# Patient Record
Sex: Male | Born: 1955 | Race: Black or African American | Hispanic: No | Marital: Single | Smoking: Never smoker
Health system: Southern US, Community
[De-identification: ages and names within clinical notes are randomized; demographics above are authoritative.]

## PROBLEM LIST (undated history)

## (undated) DIAGNOSIS — I639 Cerebral infarction, unspecified: Secondary | ICD-10-CM

## (undated) DIAGNOSIS — E119 Type 2 diabetes mellitus without complications: Secondary | ICD-10-CM

## (undated) DIAGNOSIS — I10 Essential (primary) hypertension: Secondary | ICD-10-CM

## (undated) HISTORY — PX: NO PAST SURGERIES: SHX2092

---

## 1998-07-19 ENCOUNTER — Emergency Department (HOSPITAL_COMMUNITY): Admission: EM | Admit: 1998-07-19 | Discharge: 1998-07-19 | Payer: Self-pay | Admitting: Emergency Medicine

## 1998-07-21 ENCOUNTER — Emergency Department (HOSPITAL_COMMUNITY): Admission: EM | Admit: 1998-07-21 | Discharge: 1998-07-21 | Payer: Self-pay | Admitting: Emergency Medicine

## 1998-12-14 ENCOUNTER — Encounter: Payer: Self-pay | Admitting: Family Medicine

## 1998-12-14 ENCOUNTER — Ambulatory Visit (HOSPITAL_COMMUNITY): Admission: RE | Admit: 1998-12-14 | Discharge: 1998-12-14 | Payer: Self-pay | Admitting: Family Medicine

## 1999-05-29 ENCOUNTER — Emergency Department (HOSPITAL_COMMUNITY): Admission: EM | Admit: 1999-05-29 | Discharge: 1999-05-29 | Payer: Self-pay | Admitting: Emergency Medicine

## 1999-09-24 ENCOUNTER — Emergency Department (HOSPITAL_COMMUNITY): Admission: EM | Admit: 1999-09-24 | Discharge: 1999-09-25 | Payer: Self-pay | Admitting: *Deleted

## 1999-10-06 ENCOUNTER — Emergency Department (HOSPITAL_COMMUNITY): Admission: EM | Admit: 1999-10-06 | Discharge: 1999-10-06 | Payer: Self-pay | Admitting: Emergency Medicine

## 1999-12-04 ENCOUNTER — Emergency Department (HOSPITAL_COMMUNITY): Admission: EM | Admit: 1999-12-04 | Discharge: 1999-12-04 | Payer: Self-pay | Admitting: Emergency Medicine

## 1999-12-07 ENCOUNTER — Emergency Department (HOSPITAL_COMMUNITY): Admission: EM | Admit: 1999-12-07 | Discharge: 1999-12-07 | Payer: Self-pay | Admitting: Emergency Medicine

## 2000-05-21 ENCOUNTER — Emergency Department (HOSPITAL_COMMUNITY): Admission: EM | Admit: 2000-05-21 | Discharge: 2000-05-21 | Payer: Self-pay | Admitting: Emergency Medicine

## 2001-10-29 ENCOUNTER — Emergency Department (HOSPITAL_COMMUNITY): Admission: EM | Admit: 2001-10-29 | Discharge: 2001-10-29 | Payer: Self-pay | Admitting: Emergency Medicine

## 2001-10-29 ENCOUNTER — Encounter: Payer: Self-pay | Admitting: Emergency Medicine

## 2002-02-24 ENCOUNTER — Ambulatory Visit (HOSPITAL_COMMUNITY): Admission: RE | Admit: 2002-02-24 | Discharge: 2002-02-24 | Payer: Self-pay | Admitting: Family Medicine

## 2002-02-24 ENCOUNTER — Encounter: Payer: Self-pay | Admitting: Family Medicine

## 2004-12-30 ENCOUNTER — Emergency Department (HOSPITAL_COMMUNITY): Admission: EM | Admit: 2004-12-30 | Discharge: 2004-12-30 | Payer: Self-pay | Admitting: Emergency Medicine

## 2005-05-18 ENCOUNTER — Emergency Department (HOSPITAL_COMMUNITY): Admission: EM | Admit: 2005-05-18 | Discharge: 2005-05-18 | Payer: Self-pay | Admitting: Emergency Medicine

## 2005-06-01 ENCOUNTER — Encounter (HOSPITAL_BASED_OUTPATIENT_CLINIC_OR_DEPARTMENT_OTHER): Admission: RE | Admit: 2005-06-01 | Discharge: 2005-08-30 | Payer: Self-pay | Admitting: Surgery

## 2006-09-21 ENCOUNTER — Emergency Department (HOSPITAL_COMMUNITY): Admission: EM | Admit: 2006-09-21 | Discharge: 2006-09-21 | Payer: Self-pay | Admitting: *Deleted

## 2007-10-15 ENCOUNTER — Ambulatory Visit: Admission: RE | Admit: 2007-10-15 | Discharge: 2007-12-02 | Payer: Self-pay | Admitting: Radiation Oncology

## 2011-04-28 NOTE — Consult Note (Signed)
NAMEPRICE, LACHAPELLE                ACCOUNT NO.:  [id number]   MEDICAL RECORD NO.:  [id number]          PATIENT TYPE:   LOCATION:                                 FACILITY:   PHYSICIAN:  Theresia Majors. Tanda Rockers, M.D.DATE OF BIRTH:  02/06/1959   DATE OF CONSULTATION:  06/01/2005  DATE OF DISCHARGE:                                   CONSULTATION   REASON FOR CONSULTATION:  This 55 year old man is referred by Dr. Johny Blamer for evaluation of a left tenia pedis.   IMPRESSION:  Contact dermatitis.   RECOMMENDATIONS:  Bulky dressing to the left foot with cotton and placed  inter digitally.  The patient is to cleanse his foot twice a day with mild  soap and apply 0.5% hydrocortisone cream.  He is to avoid sweating or a  saturated dressing on the foot.  He will be followed up in one week to  assess his response to therapy.   SUBJECTIVE:  This 55 year old man was in good health until a month ago when  he purchased a new pair of shoes and 3-4 days later he had severe itching in  his left lower extremity which he attributed to a puncture wound, although  he does not recall precisely having sustained such an injury.  He had  intense itching resulting in scratching, the scratching resulted in weeping  of the skin and spreading redness.  He was seen in an Urgent Care Center and  was treated symptomatically and discharged.  His symptoms increased over the  next 5-7 days and he was seen in the emergency room at [facility]  and treated with topical steroid and an antifungal.  His symptoms did not  improve significantly and he was seen at [facility] a week ago and he  was given a proprietary pellet to soak his feet in and was told to see in  followup in the interim.  He was directed to the [facility] for  evaluation.   PAST MEDICAL HISTORY:  Remarkable for the absence of chronic or ongoing  illnesses.   HE DENIES ALLERGIES.   He is on no chronic medication.   FAMILY  HISTORY:  Negative for diabetes or hypertension.   SOCIAL HISTORY:  He is single and is employed at [employer].   REVIEW OF SYSTEMS:  Discloses that he is completely healthy with no  cardiovascular, pulmonary symptoms.  No GI/GU complaints nor neurological or  endocrine abnormalities.   PHYSICAL EXAMINATION:  He is alert and oriented, in no acute distress.  HEENT:  Exam is clear.  NECK:  Supple.  HEART:  Sounds are normal.  ABDOMEN:  Soft.  EXTREMITIES:  Exam is abnormal with a large inflamed excoriated area on the  dorsum of his left foot, this extends volarly.  On the volar aspect there is  evidence of intense desquamation and discoloration.  There are no frank  abscesses.  There is no drainage.  Photographs of these affected areas were  taken and are included in the report.   DISCUSSION:  This 55 year old man describes a  contact dermatitis most likely  related to the dye related in his shoes or either the material on the  internal surfaces of the shoes.  He has already discarded the shoes.  We  have encouraged him not to wear them again.  The above measures should  result in slow, but continued improvement.  We will see him in one week to  reassess his response to therapy.           ______________________________  Theresia Majors. Tanda Rockers, M.D.     Cephus Slater  D:  06/01/2005  T:  06/01/2005  Job:  [id number]

## 2013-09-29 ENCOUNTER — Ambulatory Visit: Payer: Managed Care, Other (non HMO)

## 2013-11-12 ENCOUNTER — Emergency Department (HOSPITAL_COMMUNITY): Payer: Self-pay

## 2013-11-12 ENCOUNTER — Inpatient Hospital Stay (HOSPITAL_COMMUNITY): Payer: Self-pay

## 2013-11-12 ENCOUNTER — Inpatient Hospital Stay (HOSPITAL_COMMUNITY)
Admission: EM | Admit: 2013-11-12 | Discharge: 2013-11-17 | DRG: 062 | Disposition: A | Discharge status: Home / Self Care | Payer: Self-pay | Attending: Neurology | Admitting: Neurology

## 2013-11-12 ENCOUNTER — Encounter (HOSPITAL_COMMUNITY): Payer: Self-pay | Admitting: Emergency Medicine

## 2013-11-12 DIAGNOSIS — I517 Cardiomegaly: Secondary | ICD-10-CM

## 2013-11-12 DIAGNOSIS — R29810 Facial weakness: Secondary | ICD-10-CM | POA: Diagnosis present

## 2013-11-12 DIAGNOSIS — E1165 Type 2 diabetes mellitus with hyperglycemia: Secondary | ICD-10-CM | POA: Diagnosis present

## 2013-11-12 DIAGNOSIS — E876 Hypokalemia: Secondary | ICD-10-CM | POA: Diagnosis not present

## 2013-11-12 DIAGNOSIS — I160 Hypertensive urgency: Secondary | ICD-10-CM

## 2013-11-12 DIAGNOSIS — I10 Essential (primary) hypertension: Secondary | ICD-10-CM

## 2013-11-12 DIAGNOSIS — R471 Dysarthria and anarthria: Secondary | ICD-10-CM | POA: Diagnosis present

## 2013-11-12 DIAGNOSIS — E785 Hyperlipidemia, unspecified: Secondary | ICD-10-CM | POA: Diagnosis present

## 2013-11-12 DIAGNOSIS — I639 Cerebral infarction, unspecified: Secondary | ICD-10-CM

## 2013-11-12 DIAGNOSIS — G8190 Hemiplegia, unspecified affecting unspecified side: Secondary | ICD-10-CM | POA: Diagnosis present

## 2013-11-12 DIAGNOSIS — R4789 Other speech disturbances: Secondary | ICD-10-CM | POA: Diagnosis present

## 2013-11-12 DIAGNOSIS — E119 Type 2 diabetes mellitus without complications: Secondary | ICD-10-CM | POA: Diagnosis present

## 2013-11-12 DIAGNOSIS — I672 Cerebral atherosclerosis: Secondary | ICD-10-CM | POA: Diagnosis present

## 2013-11-12 DIAGNOSIS — I6350 Cerebral infarction due to unspecified occlusion or stenosis of unspecified cerebral artery: Principal | ICD-10-CM

## 2013-11-12 LAB — BASIC METABOLIC PANEL
BUN: 9 mg/dL (ref 6–23)
CO2: 25 mEq/L (ref 19–32)
Calcium: 8.6 mg/dL (ref 8.4–10.5)
Creatinine, Ser: 0.84 mg/dL (ref 0.50–1.35)
GFR calc Af Amer: >90 mL/min (ref >90)
GFR calc non Af Amer: >90 mL/min (ref >90)
Sodium: 143 mEq/L (ref 135–145)

## 2013-11-12 LAB — CBC
HCT: 45 % (ref 39.0–52.0)
Hemoglobin: 15.1 g/dL (ref 13.0–17.0)
MCH: 27.8 pg (ref 26.0–34.0)
MCHC: 33.6 g/dL (ref 30.0–36.0)
MCV: 82.9 fL (ref 78.0–100.0)
Platelets: 244 10*3/uL (ref 150–400)
RBC: 5.43 MIL/uL (ref 4.22–5.81)
WBC: 7.5 10*3/uL (ref 4.0–10.5)

## 2013-11-12 LAB — DIFFERENTIAL
Basophils Relative: 1 % (ref 0–1)
Eosinophils Absolute: 0.1 10*3/uL (ref 0.0–0.7)
Lymphs Abs: 5.5 10*3/uL — ABNORMAL HIGH (ref 0.7–4.0)
Monocytes Absolute: 0.6 10*3/uL (ref 0.1–1.0)
Monocytes Relative: 8 % (ref 3–12)
Neutrophils Relative %: 16 % — ABNORMAL LOW (ref 43–77)

## 2013-11-12 LAB — RAPID URINE DRUG SCREEN, HOSP PERFORMED
Amphetamines: NOT DETECTED
Opiates: NOT DETECTED
Tetrahydrocannabinol: NOT DETECTED

## 2013-11-12 LAB — POCT I-STAT, CHEM 8
BUN: 12 mg/dL (ref 6–23)
Chloride: 102 mEq/L (ref 96–112)
Creatinine, Ser: 1.4 mg/dL — ABNORMAL HIGH (ref 0.50–1.35)
HCT: 49 % (ref 39.0–52.0)
Potassium: 2.1 mEq/L — CL (ref 3.5–5.1)
Sodium: 143 mEq/L (ref 135–145)
TCO2: 20 mmol/L (ref 0–100)

## 2013-11-12 LAB — POCT I-STAT TROPONIN I: Troponin i, poc: 0.02 ng/mL (ref 0.00–0.08)

## 2013-11-12 LAB — COMPREHENSIVE METABOLIC PANEL
Albumin: 4.2 g/dL (ref 3.5–5.2)
Alkaline Phosphatase: 91 U/L (ref 39–117)
BUN: 13 mg/dL (ref 6–23)
Chloride: 100 mEq/L (ref 96–112)
Creatinine, Ser: 1.03 mg/dL (ref 0.50–1.35)
GFR calc Af Amer: 90 mL/min — ABNORMAL LOW (ref >90)
Glucose, Bld: 171 mg/dL — ABNORMAL HIGH (ref 70–99)
Potassium: 2.4 mEq/L — CL (ref 3.5–5.1)
Sodium: 142 mEq/L (ref 135–145)
Total Bilirubin: 0.4 mg/dL (ref 0.3–1.2)
Total Protein: 7.4 g/dL (ref 6.0–8.3)

## 2013-11-12 LAB — URINALYSIS, ROUTINE W REFLEX MICROSCOPIC
Glucose, UA: NEGATIVE mg/dL
Leukocytes, UA: NEGATIVE
Nitrite: NEGATIVE
Protein, ur: 100 mg/dL — AB
pH: 5.5 (ref 5.0–8.0)

## 2013-11-12 LAB — LIPID PANEL
Cholesterol: 216 mg/dL — ABNORMAL HIGH (ref 0–200)
HDL: 45 mg/dL (ref >39)
Total CHOL/HDL Ratio: 4.8 RATIO

## 2013-11-12 LAB — HEMOGLOBIN A1C: Mean Plasma Glucose: 148 mg/dL — ABNORMAL HIGH (ref <117)

## 2013-11-12 LAB — URINE MICROSCOPIC-ADD ON

## 2013-11-12 LAB — TROPONIN I: Troponin I: <0.3 ng/mL (ref <0.30)

## 2013-11-12 LAB — PROTIME-INR: Prothrombin Time: 11.9 seconds (ref 11.6–15.2)

## 2013-11-12 MED ORDER — SODIUM CHLORIDE 0.9 % IV SOLN
INTRAVENOUS | Status: DC
  Administered 2013-11-12: 06:00:00 via INTRAVENOUS

## 2013-11-12 MED ORDER — PANTOPRAZOLE SODIUM 40 MG IV SOLR
40.0000 mg | Freq: Every day | INTRAVENOUS | Status: DC
  Administered 2013-11-12: 40 mg via INTRAVENOUS
  Filled 2013-11-12 (×2): qty 40

## 2013-11-12 MED ORDER — ALTEPLASE (STROKE) FULL DOSE INFUSION
0.9000 mg/kg | Freq: Once | INTRAVENOUS | Status: AC
  Administered 2013-11-12: 100 mg via INTRAVENOUS
  Filled 2013-11-12: qty 88

## 2013-11-12 MED ORDER — FENTANYL CITRATE 0.05 MG/ML IJ SOLN
INTRAMUSCULAR | Status: AC | PRN
  Administered 2013-11-12: 25 ug via INTRAVENOUS

## 2013-11-12 MED ORDER — POTASSIUM CHLORIDE 10 MEQ/50ML IV SOLN
INTRAVENOUS | Status: AC
  Filled 2013-11-12: qty 50

## 2013-11-12 MED ORDER — MIDAZOLAM HCL 2 MG/2ML IJ SOLN
INTRAMUSCULAR | Status: AC
  Filled 2013-11-12: qty 2

## 2013-11-12 MED ORDER — LABETALOL HCL 5 MG/ML IV SOLN
10.0000 mg | INTRAVENOUS | Status: DC | PRN
  Administered 2013-11-12 – 2013-11-16 (×3): 10 mg via INTRAVENOUS
  Filled 2013-11-12 (×3): qty 4

## 2013-11-12 MED ORDER — ACETAMINOPHEN 325 MG PO TABS
650.0000 mg | ORAL_TABLET | ORAL | Status: DC | PRN
  Administered 2013-11-15 – 2013-11-16 (×2): 650 mg via ORAL
  Filled 2013-11-12 (×2): qty 2

## 2013-11-12 MED ORDER — NICARDIPINE HCL IN NACL 20-0.86 MG/200ML-% IV SOLN
5.0000 mg/h | INTRAVENOUS | Status: DC
  Administered 2013-11-12 (×2): 5 mg/h via INTRAVENOUS
  Filled 2013-11-12 (×3): qty 200

## 2013-11-12 MED ORDER — LABETALOL HCL 5 MG/ML IV SOLN
INTRAVENOUS | Status: AC
  Administered 2013-11-12: 20 mg
  Filled 2013-11-12: qty 4

## 2013-11-12 MED ORDER — ACETAMINOPHEN 650 MG RE SUPP: 650.0000 mg | RECTAL | Status: DC | PRN

## 2013-11-12 MED ORDER — ASPIRIN EC 325 MG PO TBEC
325.0000 mg | DELAYED_RELEASE_TABLET | Freq: Every day | ORAL | Status: DC
  Administered 2013-11-12 – 2013-11-17 (×6): 325 mg via ORAL
  Filled 2013-11-12 (×6): qty 1

## 2013-11-12 MED ORDER — HYDRALAZINE HCL 20 MG/ML IJ SOLN
INTRAMUSCULAR | Status: AC
  Administered 2013-11-12: 5 mg via INTRAVENOUS
  Filled 2013-11-12: qty 1

## 2013-11-12 MED ORDER — IOHEXOL 300 MG/ML  SOLN
150.0000 mL | Freq: Once | INTRAMUSCULAR | Status: AC | PRN
  Administered 2013-11-12: 70 mL via INTRAVENOUS

## 2013-11-12 MED ORDER — NICARDIPINE HCL IN NACL 20-0.86 MG/200ML-% IV SOLN: 5.0000 mg/h | Freq: Once | INTRAVENOUS | Status: AC

## 2013-11-12 MED ORDER — MIDAZOLAM HCL 2 MG/2ML IJ SOLN
INTRAMUSCULAR | Status: AC | PRN
  Administered 2013-11-12: 1 mg via INTRAVENOUS

## 2013-11-12 MED ORDER — FENTANYL CITRATE 0.05 MG/ML IJ SOLN
INTRAMUSCULAR | Status: AC
  Filled 2013-11-12: qty 2

## 2013-11-12 MED ORDER — POTASSIUM CHLORIDE 10 MEQ/100ML IV SOLN
10.0000 meq | INTRAVENOUS | Status: AC
  Administered 2013-11-12 (×4): 10 meq via INTRAVENOUS
  Filled 2013-11-12 (×4): qty 100

## 2013-11-12 MED ORDER — LORAZEPAM 2 MG/ML IJ SOLN
1.0000 mg | INTRAMUSCULAR | Status: DC | PRN
  Administered 2013-11-12 – 2013-11-13 (×3): 1 mg via INTRAVENOUS
  Filled 2013-11-12 (×3): qty 1

## 2013-11-12 MED ORDER — SODIUM CHLORIDE 0.9 % IV SOLN
INTRAVENOUS | Status: DC
  Administered 2013-11-13: 05:00:00 via INTRAVENOUS

## 2013-11-12 NOTE — ED Notes (Signed)
Patient come from home per ems patient present with facial drop and left side arm and leg weakness -- pt come in code stroke call, stroke nurse assess patient and sent to ct

## 2013-11-12 NOTE — ED Notes (Signed)
MD at bedside. 

## 2013-11-12 NOTE — ED Notes (Signed)
Seen by Dr. Ranae Palms at the bridge on arrival, pt into CT scanner room at this time.

## 2013-11-12 NOTE — ED Provider Notes (Signed)
CSN: [id number]     Arrival date & time 11/12/13  0156 History   First MD Initiated Contact with Patient 11/12/13 0158     No chief complaint on file.  (Consider location/radiation/quality/duration/timing/severity/associated sxs/prior Treatment) HPI Arrives by EMS as a code stroke. Patient states he was in his normal state of health at midnight. He now has left facial droop and left-sided hemiparesis. He was tachycardic and hypertensive en route. Patient has a history of hypertension. No past medical history on file. No past surgical history on file. No family history on file. History  Substance Use Topics  . Smoking status: Not on file  . Smokeless tobacco: Not on file  . Alcohol Use: Not on file    Review of Systems  Unable to perform ROS Constitutional: Negative for fever and chills.  HENT: Positive for voice change.   Respiratory: Negative for shortness of breath.   Cardiovascular: Negative for chest pain.  Gastrointestinal: Negative for nausea, vomiting and abdominal pain.  Skin: Negative for rash and wound.  Neurological: Positive for facial asymmetry, weakness and numbness.  All other systems reviewed and are negative.    Allergies  Review of patient's allergies indicates not on file.  Home Medications  No current outpatient prescriptions on file. There were no vitals taken for this visit. Physical Exam  Nursing note and vitals reviewed. Constitutional: He is oriented to person, place, and time. He appears well-developed and well-nourished. No distress.  HENT:  Head: Normocephalic and atraumatic.  Mouth/Throat: Oropharynx is clear and moist.  Eyes: EOM are normal. Pupils are equal, round, and reactive to light.  Neck: Normal range of motion. Neck supple.  Cardiovascular: Normal rate and regular rhythm.   Pulmonary/Chest: Effort normal and breath sounds normal. No respiratory distress. He has no wheezes. He has no rales.  Abdominal: Soft. Bowel sounds are normal.  He exhibits no distension and no mass. There is no tenderness. There is no rebound and no guarding.  Musculoskeletal: Normal range of motion. He exhibits no edema and no tenderness.  Neurological: He is alert and oriented to person, place, and time.  Left lower face weakness. 2/5 motor left upper extremity with increased tone. 4/5 motor in the left lower extremity. 5/5 right upper and lower extremity. Decreased sensation on the left. NIH stroke scale score of 8  Skin: Skin is warm and dry. No rash noted. No erythema.  Psychiatric: He has a normal mood and affect. His behavior is normal.    ED Course  Procedures (including critical care time) Labs Review Labs Reviewed  ETHANOL  PROTIME-INR  APTT  CBC  DIFFERENTIAL  COMPREHENSIVE METABOLIC PANEL  TROPONIN I  URINE RAPID DRUG SCREEN (HOSP PERFORMED)  URINALYSIS, ROUTINE W REFLEX MICROSCOPIC   Imaging Review No results found.  EKG Interpretation    Date/Time:    Ventricular Rate:    PR Interval:    QRS Duration:   QT Interval:    QTC Calculation:   R Axis:     Text Interpretation:              MDM  Dr. Roseanne Reno at bedside. CT without evidence of acute bleed. Neurology will treat with TPA.   Loren Racer, MD 11/12/13 [phone]

## 2013-11-12 NOTE — Code Documentation (Signed)
57 yo bm brought in via [facility] for stroke symptoms.  Per report pt was last up walking at 0000 & started feeling "weird" at 0100 when he called EMS.  Pt was hypertensive 220s/120s upon arrival to ED.  Pt is weak on Left with Lt facial droop, dysarthria, & sensory loss, NIH 10.  Labetalol IV given without appreciable change so cardene gtt was started.  See doc flowsheets for code stroke times.

## 2013-11-12 NOTE — ED Notes (Signed)
Family at bedside. 

## 2013-11-12 NOTE — Progress Notes (Signed)
Stroke Team Progress Note  HISTORY 57 yo M, history of HTN with medication non-compliance, presented 12/3 with left face, left arm, and left leg weakness, as well as dysarthria.  Patient last normal at 1:00 AM, given TPA at 2:38 AM on 12/3.  Symptoms only mildly improved.  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  BP Elevated to 202/120 on admission, and patient placed on nicardipine drip.  He was admitted to the neuro ICU for further evaluation and treatment. He had emergent cerebral catheter angiogram by Dr. Corliss Skains which did not show any large vessel occlusion.  SUBJECTIVE The patient's sister is at bedside.  This morning, the patient is alert and conversational.  He believes his symptoms have improved somewhat, but still is noted to have slurred speech, L face, L arm, and L leg weakness.  When we initially examined the patient, he had no movement in his left arm (worse than earlier in the morning), NIHSS = 7.  Over the course of our exam, his strength improved to 4/5.  OBJECTIVE Most recent Vital Signs: Filed Vitals:   11/12/13 0545 11/12/13 0600 11/12/13 0615 11/12/13 0630  BP: 121/98 138/84 133/89 140/85  Pulse: 90 93 90 91  Temp:      TempSrc:      Resp: 25 14 20 24   Height:      Weight:      SpO2: 97% 96% 98% 97%   CBG (last 3)   Recent Labs  11/12/13 0304  GLUCAP 182*    IV Fluid Intake:   . sodium chloride    . sodium chloride    . niCARDipine 5 mg/hr (11/12/13 0331)    MEDICATIONS  . fentaNYL      . midazolam      . pantoprazole (PROTONIX) IV  40 mg Intravenous QHS   PRN:  acetaminophen, acetaminophen, labetalol  Diet:  NPO  Activity:  Bedrest DVT Prophylaxis:  SCD's  CLINICALLY SIGNIFICANT STUDIES Basic Metabolic Panel:  Recent Labs Lab 11/12/13 0159 11/12/13 0205  NA 142 143  K 2.4* 2.1*  CL 100 102  CO2 21  --   GLUCOSE 171* 178*  BUN 13 12  CREATININE  1.03 1.40*  CALCIUM 8.8  --    Liver Function Tests:  Recent Labs Lab 11/12/13 0159  AST 22  ALT 22  ALKPHOS 91  BILITOT 0.4  PROT 7.4  ALBUMIN 4.2   CBC:  Recent Labs Lab 11/12/13 0159 11/12/13 0205  WBC 7.5  --   NEUTROABS 1.2*  --   HGB 15.1 16.7  HCT 45.0 49.0  MCV 82.9  --   PLT 244  --    Coagulation:  Recent Labs Lab 11/12/13 0159  LABPROT 11.9  INR 0.89   Cardiac Enzymes:  Recent Labs Lab 11/12/13 0200  TROPONINI <0.30   Urinalysis:  Recent Labs Lab 11/12/13 0407  COLORURINE YELLOW  LABSPEC 1.013  PHURINE 5.5  GLUCOSEU NEGATIVE  HGBUR SMALL*  BILIRUBINUR NEGATIVE  KETONESUR NEGATIVE  PROTEINUR 100*  UROBILINOGEN 0.2  NITRITE NEGATIVE  LEUKOCYTESUR NEGATIVE   Lipid Panel No results found for this basename: chol, trig, hdl, cholhdl, vldl, ldlcalc   HgbA1C  No results found for this basename: HGBA1C    Urine Drug Screen:     Component Value Date/Time   LABOPIA NONE DETECTED 11/12/2013 0408   COCAINSCRNUR NONE DETECTED 11/12/2013 0408   LABBENZ NONE DETECTED 11/12/2013  0408   AMPHETMU NONE DETECTED 11/12/2013 0408   THCU NONE DETECTED 11/12/2013 0408   LABBARB NONE DETECTED 11/12/2013 0408    Alcohol Level:  Recent Labs Lab 11/12/13 0159  ETH <11    Ct Head Wo Contrast  11/12/2013   CLINICAL DATA:  Left-sided weakness and tingling.  EXAM: CT HEAD WITHOUT CONTRAST  TECHNIQUE: Contiguous axial images were obtained from the base of the skull through the vertex without intravenous contrast.  COMPARISON:  None.  FINDINGS: Sinuses/Soft tissues: Mucosal thickening of right maxillary sinus. Clear mastoid air cells.  Intracranial: Mild low density in the periventricular white matter likely related to small vessel disease. Vague hypoattenuation within the left caudate head extending into the lateral aspect of the basal ganglia and corona radiata. No significant mass effect. Minimal ex vacuo dilatation of the anterior horn of the left lateral  ventricle.  No hemorrhage, hydrocephalus, intra-axial, or extra-axial fluid collection.  IMPRESSION: 1. Hypoattenuation within the left caudate head and basal ganglia, extending into the left corona radiata. Favored to be remote, given absence of mass effect and resultant ex vacuo dilatation of the anterior horn lateral ventricle. These results were called by telephone at the time of interpretation on 11/12/2013 at 2:21 AM to Dr. Roseanne Reno, who verbally acknowledged these results. 2. Mild small vessel ischemic change. 3. Sinus disease.   Electronically Signed   By: Jeronimo Greaves M.D.   On: 11/12/2013 02:23    CT of the brain   hypoattenuation in left caudate head and basal ganglia, extending into L corona radiata, favored to be remote  MRI of the brain   Pending  MRA of the brain   Pending  2D Echocardiogram   Pending  Carotid Doppler   Pending  CXR   Pending  EKG  Sinus rhythm, no acute ST abnormalities  Therapy Recommendations Pending  Physical Exam   Filed Vitals:   11/12/13 0745  BP:   Pulse: 86  Temp: 98 F (36.7 C)  Resp: 22   Mental Status:  A&O x3, speech fluent though with mild dysarthria.  Pt follows commands without difficulty. Cranial Nerves:  II - visual fields intact  III/IV/VI - PERRL, EOMI  V/VII - left lower facial droop  VIII - hearing intact X - palate elevates bilaterally, speech mildly slurred XII - L tongue deviation Motor:  L arm: initially 0/5, then improved to 4/5 L leg: 4/5 R arm: 5/5 R leg: 5/5 Normal muscle bulk and tone Sensory: intact Deep Tendon Reflexes: 1+ bilaterally Cerebellar: Normal finger-to-nose testing (impaired on left due to strength)  Carotid auscultation: No bruit NIHSS 3 ASSESSMENT [name] is a 57 y.o. male presenting with L face and extremity weakness and dysarthria. Status post IV t-PA at 2:38 AM 12/3. 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 Infarct felt  to be thrombotic secondary to small vessel disease.  On no anticoagulation prior to admission. Will start Aspirin after follow-up CT scan for secondary stroke prevention. Patient with resultant mild improvement in symptoms. Work up underway.   IR angiogram showed no large vessel disease, though scattered intracranial atherosclerosis  S/p [facility] 12/3  Hospital day # 0  TREATMENT/PLAN  After repeat CT scan tomorrow morning, start ASA 325 mg daily for secondary stroke prevention.  MRI pending  Follow-up A1C, FLP  2D echo ordered, pending  PT/OT/SLP pending  Repleted K.  Recheck K and Mg at 10:00 am  Keep SBP 140-180 with  cardizem drip (per cuff)  Janalyn Harder, PGY3 Pager: [phone] 11/12/2013 7:14 AM This patient is critically ill and at significant risk of neurological worsening, death and care requires constant monitoring of vital signs, hemodynamics,respiratory and cardiac monitoring,review of multiple databases, neurological assessment, discussion with family, other specialists and medical decision making of high complexity. I spent 30 minutes of neurocritical care time  in the care of  this patient. I have personally obtained a history, examined the patient, evaluated imaging results, and formulated the assessment and plan of care. I agree with the above. Delia Heady, MD

## 2013-11-12 NOTE — ED Notes (Signed)
Patient transported to CT 

## 2013-11-12 NOTE — ED Notes (Signed)
CBG-182. Notified RN 

## 2013-11-12 NOTE — ED Notes (Signed)
Patient arrived to CT 

## 2013-11-12 NOTE — Progress Notes (Signed)
UR completed.  Kayin Osment, RN BSN MHA CCM Trauma/Neuro ICU Case Manager [phone]  

## 2013-11-12 NOTE — Procedures (Addendum)
S/P 4 vessle cerebral arteriogram  RT CFA approach. Findings. No gross occlusions,stenosis , intralumoinal filling defects or dissections seen. 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 Scattered focal areas of caliber irregularity = intracranial arteriosclerosis

## 2013-11-12 NOTE — Progress Notes (Signed)
Pt becoming more restlessSBP in 200s.. Neuro exam unchanged. He keeps moving rt leg and bending at waist despite education. He cant get comfortable, he denies pain. He is agitated. Provider Alveda Reasons notified, orders received.

## 2013-11-12 NOTE — ED Notes (Signed)
Dr Stewart at bedside.

## 2013-11-12 NOTE — Progress Notes (Signed)
Subjective: Pt awake/alert; family in room; feeling better; denies HA, visual changes; has had some intermittent dizzy spells  Objective: Vital signs in last 24 hours: Temp:  [98 F (36.7 C)-98.7 F (37.1 C)] 98.2 F (36.8 C) (12/03 1100) Pulse Rate:  [80-110] 82 (12/03 1200) Resp:  [14-34] 20 (12/03 1200) BP: (121-202)/(72-136) 131/76 mmHg (12/03 1200) SpO2:  [93 %-98 %] 97 % (12/03 1200) Arterial Line BP: (143-183)/(75-100) 165/91 mmHg (12/03 1200) Weight:  [215 lb 9.8 oz (97.8 kg)-216 lb 14.9 oz (98.4 kg)] 216 lb 14.9 oz (98.4 kg) (12/03 0500) Last BM Date: 11/12/13  Intake/Output from previous day: 12/02 0701 - 12/03 0700 In: 486.7 [I.V.:186.7; IV Piggyback:300] Out: -  Intake/Output this shift: Total I/O In: 561.7 [I.V.:561.7] Out: 3551 [Urine:3550; Stool:1]  Pt awake/alert; mild left facial droop/mild dysarthria; PERRL/EOMI; strength improved in LUE/LLE- now 4/5; 5/5 RUE/RLE; rt groin /CFA sheath intact, NT, no hematoma; intact distal pulses  Lab Results:   Recent Labs  11/12/13 0159 11/12/13 0205  WBC 7.5  --   HGB 15.1 16.7  HCT 45.0 49.0  PLT 244  --    BMET  Recent Labs  11/12/13 0159 11/12/13 0205  NA 142 143  K 2.4* 2.1*  CL 100 102  CO2 21  --   GLUCOSE 171* 178*  BUN 13 12  CREATININE 1.03 1.40*  CALCIUM 8.8  --    PT/INR  Recent Labs  11/12/13 0159  LABPROT 11.9  INR 0.89   ABG No results found for this basename: PHART, PCO2, PO2, HCO3,  in the last 72 hours  Studies/Results: Ct Head Wo Contrast  11/12/2013   CLINICAL DATA:  Left-sided weakness and tingling.  EXAM: CT HEAD WITHOUT CONTRAST  TECHNIQUE: Contiguous axial images were obtained from the base of the skull through the vertex without intravenous contrast.  COMPARISON:  None.  FINDINGS: Sinuses/Soft tissues: Mucosal thickening of right maxillary sinus. Clear mastoid air cells.  Intracranial: Mild low density in the periventricular white matter likely related to small vessel  disease. Vague hypoattenuation within the left caudate head extending into the lateral aspect of the basal ganglia and corona radiata. No significant mass effect. Minimal ex vacuo dilatation of the anterior horn of the left lateral ventricle.  No hemorrhage, hydrocephalus, intra-axial, or extra-axial fluid collection.  IMPRESSION: 1. Hypoattenuation within the left caudate head and basal ganglia, extending into the left corona radiata. Favored to be remote, given absence of mass effect and resultant ex vacuo dilatation of the anterior horn lateral ventricle. These results were called by telephone at the time of interpretation on 11/12/2013 at 2:21 AM to Dr. Roseanne Reno, who verbally acknowledged these results. 2. Mild small vessel ischemic change. 3. Sinus disease.   Electronically Signed   By: Jeronimo Greaves M.D.   On: 11/12/2013 02:23   Dg Chest Port 1 View  11/12/2013   CLINICAL DATA:  Stroke  EXAM: PORTABLE CHEST - 1 VIEW  COMPARISON:  None.  FINDINGS: Low lung volumes are present, causing crowding of the pulmonary vasculature. Accounting for the low lung volumes, there is borderline cardiomegaly. Mild thoracic spondylosis. The lungs appear clear.  IMPRESSION: 1. Borderline cardiomegaly. 2. Low lung volumes.   Electronically Signed   By: Herbie Baltimore M.D.   On: 11/12/2013 09:56   Ir Angio Intra Extracran Sel Com Carotid Innominate Bilat Mod Sed  11/12/2013   CLINICAL DATA:  Acute onset of left-sided hemiparesis left arm greater than left leg. Gaze deviation. Severe dysarthria.  EXAM:  BILATERAL COMMON CAROTID AND INNOMINATE ANGIOGRAPHY AND BILATERAL VERTEBRAL ARTERY ANGIOGRAMS:  ANESTHESIA/SEDATION: Modified sedation  MEDICATIONS:  Versed 1 mg IV. Fentanyl 25 mcg IV.  CONTRAST:  70mL OMNIPAQUE IOHEXOL 300 MG/ML  SOLN  PROCEDURE: Following a full explanation of the procedure along with the potential associated complications, an informed witnessed consent was obtained from patient's sister and son.  The right  groin was prepped and draped in the usual sterile fashion. Thereafter, using a modified Seldinger technique, transfemoral access into right common femoral artery was obtained without difficulty. Over a 0.035-inch guidewire, a 5 [place] Pinnacle sheath was inserted. Through this and also over a 0.035-inch guidewire, a 5 [place] JB1 catheter was advanced to the aortic arch region and selectively positioned in the right common carotid artery, the right vertebral artery, the left common carotid artery and left vertebral artery.  There were no acute complications. The patient tolerated the procedure well.  COMPLICATIONS: None immediate.  FINDINGS: The right common carotid arteriogram demonstrates the right external carotid artery and its major branches to be normal.  The right internal carotid artery at the bulb to the cranial skull base opacifies normally.  The petrous, cavernous and supraclinoid segments are widely patent. A dominant right posterior communicating artery is seen opacifying the right posterior cerebral artery distribution.  The right middle cerebral artery is seen to opacify into the capillary and venous phases. Scattered focal areas of mild irregularity suggest intracranial arteriosclerosis.  There is no opacification of the right anterior cerebral artery A1 segment.  The right vertebral artery origin is normal.  The vessel opacifies normally to the cranial skull base.  There is normal opacification of the hypoplastic right posterior-inferior cerebellar artery.  The basilar artery, the left posterior cerebral artery, the superior cerebral arteries and the anterior inferior cerebral arteries opacify normally into the capillary and venous phases. The right posterior cerebral artery has poor opacification secondary to mixing with the non-opacified blood from the anterior circulation as described above.  The left common carotid arteriogram demonstrates the left external carotid artery and its major branch to  be normal.  The left internal carotid artery at the bulb to the cranial skull base opacifies normally.  The petrous, cavernous and supraclinoid segments are widely patent. Again seen is a left posterior communicating artery with partial opacification of the left posterior inferior cerebellar artery distribution.  The left middle and the left anterior cerebral arteries opacify into the capillary and venous phases.  Caliber irregularity is seen of the pericallosal and the callosal marginal branches suggestive of intracranial arteriosclerosis.  Cross filling via the anterior communicating artery of the right anterior cerebral artery A2 segment is seen. The left vertebral artery origin is from the thyrocervical trunk.  The vessel opacifies normally to the cranial skull base.  There is normal opacification of the left vertebral artery to the skull base. At the level of approximately C2 there is fenestration of the right vertebral artery 2nd vertical segment, a developmental variation.  Distal to this the left vertebrobasilar junction is seen to opacify normally. Normal opacification is seen of the left posterior inferior cerebellar artery.  The opacified portion of the basilar artery, the posterior cerebral arteries, the superior cerebellar arteries and the anterior inferior cerebellar arteries is grossly normal into the capillary and venous phases. Significant mixing with non-opacified blood from the contralateral vertebral artery is seen. Also visualized is the opacification of the anterior spinal artery at the level of C1-C2.  IMPRESSION: Angiographically no evidence of gross  intraluminal filling defects, occlusions, stenoses, dissections or of aneurysms or dural AV malformations.  Scattered focal areas of caliber irregularity in the anterior circulation as described most likely representing intracranial arteriosclerosis.   Electronically Signed   By: Julieanne Cotton M.D.   On: 11/12/2013 09:11     Anti-infectives: Anti-infectives   None      Assessment/Plan: s/p cerebral angio earlier this am- see results above (hx left sided weakness, dysarthria); plan for rt CFA sheath removal later this evening(pt received TPA earlier); other plans as outlined by neuro  LOS: 0 days    Benjamin Cohen,D [facility] 11/12/2013

## 2013-11-12 NOTE — Evaluation (Signed)
Clinical/Bedside Swallow Evaluation Patient Details  Name: Benjamin Cohen MRN: [id number] Date of Birth: 07-18-1956  Today's Date: 11/12/2013 Time: 0800-0820 SLP Time Calculation (min): 20 min  Past Medical History: History reviewed. No pertinent past medical history. Past Surgical History: History reviewed. No pertinent past surgical history. HPI:  57 y.o. 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 Given tPA. Pt with left CVA on CT, but also with left sided weakness. MRI is pending   Assessment / Plan / Recommendation Clinical Impression  Pt presents with no evidence of significant oral dyspahgia despite mild left labial and lingual weakness. No evidence of aspiration observed. Pt may initiate regular diet and thin liquids when in upright position. No SLP f/u needed for swallowing.     Aspiration Risk  Mild    Diet Recommendation Regular;Thin liquid   Liquid Administration via: Cup;Straw Medication Administration: Whole meds with liquid Supervision: Patient able to self feed Postural Changes and/or Swallow Maneuvers: Seated upright 90 degrees    Other  Recommendations     Follow Up Recommendations  None    Frequency and Duration        Pertinent Vitals/Pain NA    SLP Swallow Goals     Swallow Study Prior Functional Status       General HPI: 57 y.o. 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 Given tPA. Pt with left CVA on CT, but also with left sided weakness. MRI is pending Type of Study: Bedside swallow evaluation Diet Prior to this Study: NPO Temperature Spikes Noted: No Respiratory Status: Room air History of Recent Intubation: No Behavior/Cognition: Alert;Cooperative;Pleasant mood Oral Cavity - Dentition: Adequate natural dentition Self-Feeding Abilities: Able to feed self Patient Positioning: Partially  reclined Baseline Vocal Quality: Clear Volitional Cough: Strong Volitional Swallow: Able to elicit    Oral/Motor/Sensory Function Overall Oral Motor/Sensory Function: Impaired Labial ROM: Reduced left Labial Symmetry: Abnormal symmetry left Labial Strength: Reduced Labial Sensation: Reduced Lingual ROM: Reduced left Lingual Symmetry: Abnormal symmetry left Lingual Strength: Reduced Lingual Sensation: Reduced Facial ROM: Reduced left Facial Symmetry: Within Functional Limits Facial Strength: Within Functional Limits Velum: Within Functional Limits Mandible: Within Functional Limits   Ice Chips Ice chips: Within functional limits   Thin Liquid Thin Liquid: Within functional limits    Nectar Thick Nectar Thick Liquid: Not tested   Honey Thick Honey Thick Liquid: Not tested   Puree Puree: Within functional limits   Solid   GO    Solid: Within functional limits      [facility], [place] CCC-SLP [phone]  [name] 11/12/2013,8:32 AM

## 2013-11-12 NOTE — H&P (Signed)
Admission H&P    Chief Complaint: Acute onset of left facial droop, dysarthria and hemiparesis.  HPI: Benjamin Cohen is an 57 y.o. male with a history of hypertension and poor compliance with treatment presenting with acute onset of left facial and extremity weakness as well as dysarthria. Onset of symptoms was 1 AM today. He has not been on antiplatelet therapy. CT scan of his head showed called caudate nucleus stroke on the left, but no acute intracranial abnormality. NIH stroke score was 10. Blood pressure was markedly elevated at 2 09/11/2019. He was given intravenous labetalol and was subsequently started on Cardene drip. He was deemed a candidate for intravenous thrombolytic therapy with TPA which was administered. No improvement of deficits was noted. Interventional radiology was consulted and cerebral angiogram was arranged.  LSN: 1 AM on 11/12/2013 tPA Given: Yes mRankin:  No past medical history on file.  No past surgical history on file.  No family history on file. Social History:  has no tobacco, alcohol, and drug history on file.  Allergies: Allergies not on file   (Not in a hospital admission)  ROS: History obtained from the patient  General ROS: negative for - chills, fatigue, fever, night sweats, weight gain or weight loss Psychological ROS: negative for - behavioral disorder, hallucinations, memory difficulties, mood swings or suicidal ideation Ophthalmic ROS: negative for - blurry vision, double vision, eye pain or loss of vision ENT ROS: negative for - epistaxis, nasal discharge, oral lesions, sore throat, tinnitus or vertigo Allergy and Immunology ROS: negative for - hives or itchy/watery eyes Hematological and Lymphatic ROS: negative for - bleeding problems, bruising or swollen lymph nodes Endocrine ROS: negative for - galactorrhea, hair pattern changes, polydipsia/polyuria or temperature intolerance Respiratory ROS: negative for - cough, hemoptysis, shortness of  breath or wheezing Cardiovascular ROS: negative for - chest pain, dyspnea on exertion, edema or irregular heartbeat Gastrointestinal ROS: negative for - abdominal pain, diarrhea, hematemesis, nausea/vomiting or stool incontinence Genito-Urinary ROS: negative for - dysuria, hematuria, incontinence or urinary frequency/urgency Musculoskeletal ROS: negative for - joint swelling or muscular weakness Neurological ROS: as noted in HPI Dermatological ROS: negative for rash and skin lesion changes  Physical Examination: Blood pressure 153/87, pulse 102, temperature 98 F (36.7 C), temperature source Oral, resp. rate 34, weight 97.8 kg (215 lb 9.8 oz), SpO2 96.00%.  HEENT-  Normocephalic, no lesions, without obvious abnormality.  Normal external eye and conjunctiva.  Normal TM's bilaterally.  Normal auditory canals and external ears. Normal external nose, mucus membranes and septum.  Normal pharynx. Neck supple with no masses, nodes, nodules or enlargement. Cardiovascular - regular rate and rhythm, S1, S2 normal, no murmur, click, rub or gallop Lungs - chest clear, no wheezing, rales, normal symmetric air entry, Heart exam - S1, S2 normal, no murmur, no gallop, rate regular Abdomen - soft, non-tender; bowel sounds normal; no masses,  no organomegaly Extremities - no joint deformities, effusion, or inflammation, no edema and no skin discoloration  Neurologic Examination: Mental Status: Alert, oriented to correct age but disoriented to correct month.  Speech was moderately slurred without evidence of aphasia. Able to follow commands without difficulty, except for use of his left upper extremity. Cranial Nerves: II-Visual fields were normal. III/IV/VI-Pupils were equal and reacted. Extraocular movements were full and conjugate.    V/VII-no facial numbness; moderately severe left lower facial weakness. VIII-normal. X-moderate dysarthria. Motor: Paralysis of left upper extremity with no voluntary  movement proximally nor distally; moderate weakness proximally of left lower extremity;  increased muscle tone of left extremities compared to right extremities; normal strength and muscle tone of left extremities. Sensory: Normal throughout. Deep Tendon Reflexes: 2+ and symmetric. Plantars: Flexor on the right and mute on the left Cerebellar: Normal finger-to-nose testing with use of right upper extremity. Carotid auscultation: Normal  Results for orders placed during the hospital encounter of 11/12/13 (from the past 48 hour(s))  ETHANOL     Status: None   Collection Time    11/12/13  1:59 AM      Result Value Range   Alcohol, Ethyl (B) <11  0 - 11 mg/dL   Comment:            LOWEST DETECTABLE LIMIT FOR     SERUM ALCOHOL IS 11 mg/dL     FOR MEDICAL PURPOSES ONLY  PROTIME-INR     Status: None   Collection Time    11/12/13  1:59 AM      Result Value Range   Prothrombin Time 11.9  11.6 - 15.2 seconds   INR 0.89  0.00 - 1.49  APTT     Status: None   Collection Time    11/12/13  1:59 AM      Result Value Range   aPTT 26  24 - 37 seconds  CBC     Status: None   Collection Time    11/12/13  1:59 AM      Result Value Range   WBC 7.5  4.0 - 10.5 K/uL   RBC 5.43  4.22 - 5.81 MIL/uL   Hemoglobin 15.1  13.0 - 17.0 g/dL   HCT 08.6  57.8 - 46.9 %   MCV 82.9  78.0 - 100.0 fL   MCH 27.8  26.0 - 34.0 pg   MCHC 33.6  30.0 - 36.0 g/dL   RDW 62.9  52.8 - 41.3 %   Platelets 244  150 - 400 K/uL  DIFFERENTIAL     Status: Abnormal   Collection Time    11/12/13  1:59 AM      Result Value Range   Neutrophils Relative % 16 (*) 43 - 77 %   Neutro Abs 1.2 (*) 1.7 - 7.7 K/uL   Lymphocytes Relative 74 (*) 12 - 46 %   Lymphs Abs 5.5 (*) 0.7 - 4.0 K/uL   Monocytes Relative 8  3 - 12 %   Monocytes Absolute 0.6  0.1 - 1.0 K/uL   Eosinophils Relative 2  0 - 5 %   Eosinophils Absolute 0.1  0.0 - 0.7 K/uL   Basophils Relative 1  0 - 1 %   Basophils Absolute 0.1  0.0 - 0.1 K/uL  COMPREHENSIVE  METABOLIC PANEL     Status: Abnormal   Collection Time    11/12/13  1:59 AM      Result Value Range   Sodium 142  135 - 145 mEq/L   Potassium 2.4 (*) 3.5 - 5.1 mEq/L   Comment: CRITICAL RESULT CALLED TO, READ BACK BY AND VERIFIED WITH:     COOK J,RN 11/12/13 0246 WAYK   Chloride 100  96 - 112 mEq/L   CO2 21  19 - 32 mEq/L   Glucose, Bld 171 (*) 70 - 99 mg/dL   BUN 13  6 - 23 mg/dL   Creatinine, Ser 2.44  0.50 - 1.35 mg/dL   Calcium 8.8  8.4 - 01.0 mg/dL   Total Protein 7.4  6.0 - 8.3 g/dL   Albumin 4.2  3.5 - 5.2  g/dL   AST 22  0 - 37 U/L   ALT 22  0 - 53 U/L   Alkaline Phosphatase 91  39 - 117 U/L   Total Bilirubin 0.4  0.3 - 1.2 mg/dL   GFR calc non Af Amer 78 (*) >90 mL/min   GFR calc Af Amer 90 (*) >90 mL/min   Comment: (NOTE)     The eGFR has been calculated using the CKD EPI equation.     This calculation has not been validated in all clinical situations.     eGFR's persistently <90 mL/min signify possible Chronic Kidney     Disease.  TROPONIN I     Status: None   Collection Time    11/12/13  2:00 AM      Result Value Range   Troponin I <0.30  <0.30 ng/mL   Comment:            Due to the release kinetics of cTnI,     a negative result within the first hours     of the onset of symptoms does not rule out     myocardial infarction with certainty.     If myocardial infarction is still suspected,     repeat the test at appropriate intervals.  POCT I-STAT, CHEM 8     Status: Abnormal   Collection Time    11/12/13  2:05 AM      Result Value Range   Sodium 143  135 - 145 mEq/L   Potassium 2.1 (*) 3.5 - 5.1 mEq/L   Chloride 102  96 - 112 mEq/L   BUN 12  6 - 23 mg/dL   Creatinine, Ser 2.44 (*) 0.50 - 1.35 mg/dL   Glucose, Bld 010 (*) 70 - 99 mg/dL   Calcium, Ion 2.72 (*) 1.12 - 1.23 mmol/L   TCO2 20  0 - 100 mmol/L   Hemoglobin 16.7  13.0 - 17.0 g/dL   HCT 53.6  64.4 - 03.4 %   Comment NOTIFIED PHYSICIAN    POCT I-STAT TROPONIN I     Status: None   Collection Time     11/12/13  2:14 AM      Result Value Range   Troponin i, poc 0.02  0.00 - 0.08 ng/mL   Comment 3            Comment: Due to the release kinetics of cTnI,     a negative result within the first hours     of the onset of symptoms does not rule out     myocardial infarction with certainty.     If myocardial infarction is still suspected,     repeat the test at appropriate intervals.   Ct Head Wo Contrast  11/12/2013   CLINICAL DATA:  Left-sided weakness and tingling.  EXAM: CT HEAD WITHOUT CONTRAST  TECHNIQUE: Contiguous axial images were obtained from the base of the skull through the vertex without intravenous contrast.  COMPARISON:  None.  FINDINGS: Sinuses/Soft tissues: Mucosal thickening of right maxillary sinus. Clear mastoid air cells.  Intracranial: Mild low density in the periventricular white matter likely related to small vessel disease. Vague hypoattenuation within the left caudate head extending into the lateral aspect of the basal ganglia and corona radiata. No significant mass effect. Minimal ex vacuo dilatation of the anterior horn of the left lateral ventricle.  No hemorrhage, hydrocephalus, intra-axial, or extra-axial fluid collection.  IMPRESSION: 1. Hypoattenuation within the left caudate head and basal ganglia, extending into  the left corona radiata. Favored to be remote, given absence of mass effect and resultant ex vacuo dilatation of the anterior horn lateral ventricle. These results were called by telephone at the time of interpretation on 11/12/2013 at 2:21 AM to Dr. Roseanne Reno, who verbally acknowledged these results. 2. Mild small vessel ischemic change. 3. Sinus disease.   Electronically Signed   By: Jeronimo Greaves M.D.   On: 11/12/2013 02:23    Assessment: 57 y.o. 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  Stroke Risk Factors - hypertension  Plan: 1. Post TPA and intervention and management in  neuro ICU per protocol 2. MRI of the brain without contrast 3. PT consult, OT consult, Speech consult 4. Echocardiogram 5. HgbA1c, fasting lipid panel 6. Prophylactic therapy-Antiplatelet med: Aspirin, if CT scan of the head 24 hours post TPA administration shows no signs of intracranial hemorrhage. 7. Risk factor modification 8. Telemetry monitoring  This patient's admission workup required extensive clinical evaluation as well as complex management decision-making, including thrombolytic therapy with intravenous TPA, as well as consultation with interventional radiologist, and family counseling. Total critical care time was 90 minutes.   C.R. Roseanne Reno, MD [facility] [phone] 11/12/2013, 3:00 AM

## 2013-11-12 NOTE — Progress Notes (Signed)
Echocardiogram 2D Echocardiogram has been performed.  Benjamin Cohen 11/12/2013, 1:46 PM

## 2013-11-12 NOTE — ED Notes (Signed)
Arrived to bridge.

## 2013-11-12 NOTE — Progress Notes (Signed)
Technically successful exoseal deployment per Pearlean Brownie and me. 10 lb sandbag placed. Pulses reviewed. Nocomplications, groin soft, no hematoma.

## 2013-11-12 NOTE — Progress Notes (Signed)
Exoseal device deployed by [employer] at 15:49.

## 2013-11-12 NOTE — Evaluation (Signed)
Speech Language Pathology Evaluation Patient Details Name: Benjamin Cohen MRN: [id number] DOB: 03-27-1956 Today's Date: 11/12/2013 Time: 1478-2956 SLP Time Calculation (min): 25 min  Problem List:  Patient Active Problem List   Diagnosis Date Noted  . CVA (cerebral infarction) 11/12/2013  . Hypertensive urgency 11/12/2013   Past Medical History: History reviewed. No pertinent past medical history. Past Surgical History: History reviewed. No pertinent past surgical history. HPI:  57 y.o. 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 Given tPA. Pt with left CVA on CT, but also with left sided weakness. MRI is pending   Assessment / Plan / Recommendation Clinical Impression  Pt presents with mild dysarthria, mild cognitive impairment with higher level verbal reasoning tasks. Appears easily distracted. Further assessment of functional problem solving needed; pt not able to participate in functional tasks due to limitations on mobility. Recommend /fu with SLP for speech intelliglibility, cognition and further diagnostic assessment. May benefit from CIR.     SLP Assessment  Patient needs continued Speech Lanaguage Pathology Services    Follow Up Recommendations  Inpatient Rehab    Frequency and Duration min 2x/week  2 weeks   Pertinent Vitals/Pain NA   SLP Goals  SLP Goals Potential to Achieve Goals: Good  SLP Evaluation Prior Functioning  Cognitive/Linguistic Baseline: Within functional limits Vocation: Full time employment   Cognition  Overall Cognitive Status: Impaired/Different from baseline Arousal/Alertness: Awake/alert Orientation Level: Oriented X4 Attention: Focused;Sustained;Selective Focused Attention: Appears intact Sustained Attention: Impaired Sustained Attention Impairment: Verbal complex;Functional complex Selective Attention: Impaired Selective Attention Impairment: Verbal  complex;Functional complex Memory: Impaired Awareness: Impaired Awareness Impairment: Intellectual impairment;Emergent impairment Problem Solving: Impaired Problem Solving Impairment: Verbal complex (functional complex not tested yet - pt not able to sit up ) Executive Function: Sequencing;Reasoning Reasoning: Impaired Reasoning Impairment: Verbal complex;Functional complex Sequencing: Impaired Sequencing Impairment: Verbal complex;Functional complex Behaviors: Restless Safety/Judgment: Impaired    Comprehension  Auditory Comprehension Overall Auditory Comprehension: Appears within functional limits for tasks assessed    Expression Verbal Expression Overall Verbal Expression: Appears within functional limits for tasks assessed   Oral / Motor Oral Motor/Sensory Function Overall Oral Motor/Sensory Function: Impaired Labial ROM: Reduced left Labial Symmetry: Abnormal symmetry left Labial Strength: Reduced Labial Sensation: Reduced Lingual ROM: Reduced left Lingual Symmetry: Abnormal symmetry left Lingual Strength: Reduced Lingual Sensation: Reduced Facial ROM: Reduced left Facial Symmetry: Within Functional Limits Facial Strength: Within Functional Limits Velum: Within Functional Limits Mandible: Within Functional Limits Motor Speech Overall Motor Speech: Impaired Respiration: Within functional limits Phonation: Normal Resonance: Within functional limits Articulation: Impaired Level of Impairment: Phrase Intelligibility: Intelligibility reduced Word: 75-100% accurate Phrase: 75-100% accurate Sentence: 75-100% accurate Conversation: 75-100% accurate Motor Planning: Witnin functional limits Motor Speech Errors: Not applicable   GO    Benjamin Ditty, MA CCC-SLP [phone]  Benjamin Cohen 11/12/2013, 9:00 AM

## 2013-11-12 NOTE — ED Notes (Signed)
Page received, no encode received, pending arrival, EDP, RN, EMT, lab and RRT RN at bridge.

## 2013-11-12 NOTE — ED Notes (Signed)
Code stroke called

## 2013-11-12 NOTE — ED Notes (Signed)
Drs. Roseanne Reno (neuro) and Ranae Palms (EDP) at [facility].

## 2013-11-13 ENCOUNTER — Inpatient Hospital Stay (HOSPITAL_COMMUNITY): Payer: Self-pay

## 2013-11-13 DIAGNOSIS — G8110 Spastic hemiplegia affecting unspecified side: Secondary | ICD-10-CM

## 2013-11-13 DIAGNOSIS — I6330 Cerebral infarction due to thrombosis of unspecified cerebral artery: Secondary | ICD-10-CM

## 2013-11-13 LAB — BASIC METABOLIC PANEL
BUN: 12 mg/dL (ref 6–23)
Calcium: 8.6 mg/dL (ref 8.4–10.5)
GFR calc non Af Amer: >90 mL/min (ref >90)
Glucose, Bld: 141 mg/dL — ABNORMAL HIGH (ref 70–99)
Sodium: 141 mEq/L (ref 135–145)

## 2013-11-13 MED ORDER — ATORVASTATIN CALCIUM 80 MG PO TABS
80.0000 mg | ORAL_TABLET | Freq: Every day | ORAL | Status: DC
  Administered 2013-11-13 – 2013-11-16 (×4): 80 mg via ORAL
  Filled 2013-11-13 (×5): qty 1

## 2013-11-13 MED ORDER — PANTOPRAZOLE SODIUM 40 MG PO TBEC
40.0000 mg | DELAYED_RELEASE_TABLET | Freq: Every day | ORAL | Status: DC
  Administered 2013-11-13: 40 mg via ORAL
  Filled 2013-11-13: qty 1

## 2013-11-13 NOTE — Progress Notes (Signed)
Stroke Team Progress Note  HISTORY 57 yo M, history of HTN with medication non-compliance, presented 12/3 with left face, left arm, and left leg weakness, as well as dysarthria.  Patient last normal at 1:00 AM, given TPA at 2:38 AM on 12/3.  Symptoms only mildly improved.  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  BP Elevated to 202/120 on admission, and patient placed on nicardipine drip.  He was admitted to the neuro ICU for further evaluation and treatment. He had emergent cerebral catheter angiogram by Dr. Corliss Skains which did not show any large vessel occlusion.  SUBJECTIVE This morning, the patient is alert and conversational.  He was noted to have some agitation overnight, but this has resolved.  Patient notes improvement in left arm and leg strength.  SBP 140-160's overnight.  Not on nicardipine drip this morning.  NIHSS = 2 this AM.  OBJECTIVE Most recent Vital Signs: Filed Vitals:   11/13/13 0200 11/13/13 0400 11/13/13 0404 11/13/13 0600  BP: 152/85 144/90  147/82  Pulse: 72 80  73  Temp:   98.2 F (36.8 C)   TempSrc:   Oral   Resp: 20 21  18   Height:      Weight:      SpO2: 98% 94%  94%   CBG (last 3)   Recent Labs  11/12/13 0304  GLUCAP 182*    IV Fluid Intake:   . sodium chloride 75 mL/hr at 11/13/13 0523  . sodium chloride 75 mL/hr at 11/12/13 1800  . niCARDipine Stopped (11/12/13 0900)    MEDICATIONS  . aspirin EC  325 mg Oral Daily  . pantoprazole (PROTONIX) IV  40 mg Intravenous QHS   PRN:  acetaminophen, acetaminophen, labetalol, LORazepam  Diet:  Cardiac  Activity:  Bedrest DVT Prophylaxis:  SCD's  CLINICALLY SIGNIFICANT STUDIES Basic Metabolic Panel:   Recent Labs Lab 11/12/13 0159 11/12/13 0205 11/12/13 1155  NA 142 143 143  K 2.4* 2.1* 3.5  CL 100 102 104  CO2 21  --  25  GLUCOSE 171* 178* 123*  BUN 13 12 9   CREATININE 1.03 1.40* 0.84  CALCIUM  8.8  --  8.6  MG  --   --  1.8   Liver Function Tests:   Recent Labs Lab 11/12/13 0159  AST 22  ALT 22  ALKPHOS 91  BILITOT 0.4  PROT 7.4  ALBUMIN 4.2   CBC:   Recent Labs Lab 11/12/13 0159 11/12/13 0205  WBC 7.5  --   NEUTROABS 1.2*  --   HGB 15.1 16.7  HCT 45.0 49.0  MCV 82.9  --   PLT 244  --    Coagulation:   Recent Labs Lab 11/12/13 0159  LABPROT 11.9  INR 0.89   Cardiac Enzymes:   Recent Labs Lab 11/12/13 0200  TROPONINI <0.30   Urinalysis:   Recent Labs Lab 11/12/13 0407  COLORURINE YELLOW  LABSPEC 1.013  PHURINE 5.5  GLUCOSEU NEGATIVE  HGBUR SMALL*  BILIRUBINUR NEGATIVE  KETONESUR NEGATIVE  PROTEINUR 100*  UROBILINOGEN 0.2  NITRITE NEGATIVE  LEUKOCYTESUR NEGATIVE   Lipid Panel    Component Value Date/Time   CHOL 216* 11/12/2013 0700   HgbA1C  Lab Results  Component Value Date   HGBA1C 6.8* 11/12/2013    Urine Drug Screen:     Component Value Date/Time   LABOPIA NONE DETECTED 11/12/2013 0408   COCAINSCRNUR NONE DETECTED 11/12/2013 0408  LABBENZ NONE DETECTED 11/12/2013 0408   AMPHETMU NONE DETECTED 11/12/2013 0408   THCU NONE DETECTED 11/12/2013 0408   LABBARB NONE DETECTED 11/12/2013 0408    Alcohol Level:   Recent Labs Lab 11/12/13 0159  ETH <11    Ct Head Wo Contrast  11/12/2013   CLINICAL DATA:  Left arm weakness, followup after tPA  EXAM: CT HEAD WITHOUT CONTRAST  TECHNIQUE: Contiguous axial images were obtained from the base of the skull through the vertex without intravenous contrast.  COMPARISON:  Prior angiogram and CT performed earlier on the same day.  FINDINGS: Again seen is a sequelae of probable remote infarct involving the left hepatic head with extension into the left coronal radiata, not significantly changed relative to the prior study. There is associated mild ex vacuo dilatation of the left lateral ventricle.  A new hypodensity measuring 1.2 x 0.9 cm is seen involving the right she came in, consistent with  acute infarct. There is question of extension into the right corona radiata (series 3, image 15). No acute intracranial hemorrhage identified. No other definite acute intracranial infarct. No mass or midline shift. No hydrocephalus. No extra-axial fluid collection. Calvarium is intact. Orbits are normal.  Paranasal sinuses and mastoid air cells are clear.  IMPRESSION: 1. No evidence of intracranial hemorrhage status post tPA administration. 2. New 1.2 x 0.9 cm hypodensity involving the right putamen with extension into the right corona radiata, consistent with acute ischemic infarct. 3. Stable appearance of hypodensity involving the left caudate head with extension into the left coronal radiata. Again, this finding is favored to be chronic in nature.   Electronically Signed   By: Rise Mu M.D.   On: 11/12/2013 23:21   Ct Head Wo Contrast  11/12/2013   CLINICAL DATA:  Left-sided weakness and tingling.  EXAM: CT HEAD WITHOUT CONTRAST  TECHNIQUE: Contiguous axial images were obtained from the base of the skull through the vertex without intravenous contrast.  COMPARISON:  None.  FINDINGS: Sinuses/Soft tissues: Mucosal thickening of right maxillary sinus. Clear mastoid air cells.  Intracranial: Mild low density in the periventricular white matter likely related to small vessel disease. Vague hypoattenuation within the left caudate head extending into the lateral aspect of the basal ganglia and corona radiata. No significant mass effect. Minimal ex vacuo dilatation of the anterior horn of the left lateral ventricle.  No hemorrhage, hydrocephalus, intra-axial, or extra-axial fluid collection.  IMPRESSION: 1. Hypoattenuation within the left caudate head and basal ganglia, extending into the left corona radiata. Favored to be remote, given absence of mass effect and resultant ex vacuo dilatation of the anterior horn lateral ventricle. These results were called by telephone at the time of interpretation on  11/12/2013 at 2:21 AM to Dr. Roseanne Reno, who verbally acknowledged these results. 2. Mild small vessel ischemic change. 3. Sinus disease.   Electronically Signed   By: Jeronimo Greaves M.D.   On: 11/12/2013 02:23   Dg Chest Port 1 View  11/12/2013   CLINICAL DATA:  Stroke  EXAM: PORTABLE CHEST - 1 VIEW  COMPARISON:  None.  FINDINGS: Low lung volumes are present, causing crowding of the pulmonary vasculature. Accounting for the low lung volumes, there is borderline cardiomegaly. Mild thoracic spondylosis. The lungs appear clear.  IMPRESSION: 1. Borderline cardiomegaly. 2. Low lung volumes.   Electronically Signed   By: Herbie Baltimore M.D.   On: 11/12/2013 09:56   Ir Angio Intra Extracran Sel Com Carotid Innominate Bilat Mod Sed  11/12/2013  CLINICAL DATA:  Acute onset of left-sided hemiparesis left arm greater than left leg. Gaze deviation. Severe dysarthria.  EXAM: BILATERAL COMMON CAROTID AND INNOMINATE ANGIOGRAPHY AND BILATERAL VERTEBRAL ARTERY ANGIOGRAMS:  ANESTHESIA/SEDATION: Modified sedation  MEDICATIONS:  Versed 1 mg IV. Fentanyl 25 mcg IV.  CONTRAST:  70mL OMNIPAQUE IOHEXOL 300 MG/ML  SOLN  PROCEDURE: Following a full explanation of the procedure along with the potential associated complications, an informed witnessed consent was obtained from patient's sister and son.  The right groin was prepped and draped in the usual sterile fashion. Thereafter, using a modified Seldinger technique, transfemoral access into right common femoral artery was obtained without difficulty. Over a 0.035-inch guidewire, a 5 [place] Pinnacle sheath was inserted. Through this and also over a 0.035-inch guidewire, a 5 [place] JB1 catheter was advanced to the aortic arch region and selectively positioned in the right common carotid artery, the right vertebral artery, the left common carotid artery and left vertebral artery.  There were no acute complications. The patient tolerated the procedure well.  COMPLICATIONS: None immediate.   FINDINGS: The right common carotid arteriogram demonstrates the right external carotid artery and its major branches to be normal.  The right internal carotid artery at the bulb to the cranial skull base opacifies normally.  The petrous, cavernous and supraclinoid segments are widely patent. A dominant right posterior communicating artery is seen opacifying the right posterior cerebral artery distribution.  The right middle cerebral artery is seen to opacify into the capillary and venous phases. Scattered focal areas of mild irregularity suggest intracranial arteriosclerosis.  There is no opacification of the right anterior cerebral artery A1 segment.  The right vertebral artery origin is normal.  The vessel opacifies normally to the cranial skull base.  There is normal opacification of the hypoplastic right posterior-inferior cerebellar artery.  The basilar artery, the left posterior cerebral artery, the superior cerebral arteries and the anterior inferior cerebral arteries opacify normally into the capillary and venous phases. The right posterior cerebral artery has poor opacification secondary to mixing with the non-opacified blood from the anterior circulation as described above.  The left common carotid arteriogram demonstrates the left external carotid artery and its major branch to be normal.  The left internal carotid artery at the bulb to the cranial skull base opacifies normally.  The petrous, cavernous and supraclinoid segments are widely patent. Again seen is a left posterior communicating artery with partial opacification of the left posterior inferior cerebellar artery distribution.  The left middle and the left anterior cerebral arteries opacify into the capillary and venous phases.  Caliber irregularity is seen of the pericallosal and the callosal marginal branches suggestive of intracranial arteriosclerosis.  Cross filling via the anterior communicating artery of the right anterior cerebral artery  A2 segment is seen. The left vertebral artery origin is from the thyrocervical trunk.  The vessel opacifies normally to the cranial skull base.  There is normal opacification of the left vertebral artery to the skull base. At the level of approximately C2 there is fenestration of the right vertebral artery 2nd vertical segment, a developmental variation.  Distal to this the left vertebrobasilar junction is seen to opacify normally. Normal opacification is seen of the left posterior inferior cerebellar artery.  The opacified portion of the basilar artery, the posterior cerebral arteries, the superior cerebellar arteries and the anterior inferior cerebellar arteries is grossly normal into the capillary and venous phases. Significant mixing with non-opacified blood from the contralateral vertebral artery is seen. Also visualized  is the opacification of the anterior spinal artery at the level of C1-C2.  IMPRESSION: Angiographically no evidence of gross intraluminal filling defects, occlusions, stenoses, dissections or of aneurysms or dural AV malformations.  Scattered focal areas of caliber irregularity in the anterior circulation as described most likely representing intracranial arteriosclerosis.   Electronically Signed   By: Julieanne Cotton M.D.   On: 11/12/2013 09:11    CT of the brain   hypoattenuation in left caudate head and basal ganglia, extending into L corona radiata, favored to be remote  MRI of the brain   Pending  MRA of the brain   Pending  2D Echocardiogram   EF 55-60%, grade 1 diastolic dysfunction  Carotid Doppler   Pending  EKG  Sinus rhythm, no acute ST abnormalities  Therapy Recommendations Pending  Physical Exam   Filed Vitals:   11/13/13 0600  BP: 147/82  Pulse: 73  Temp:   Resp: 18   Mental Status:  A&O x3, speech fluent though with mild dysarthria.  Pt follows commands without difficulty. Cranial Nerves:  II - visual fields intact  III/IV/VI - PERRL, EOMI   V/VII - left lower facial droop  VIII - hearing intact X - palate elevates bilaterally, speech mildly slurred XII - L tongue deviation Motor:  L arm: 4/5 L leg: 4/5 R arm: 5/5 R leg: 5/5 Normal muscle bulk and tone Sensory: intact Deep Tendon Reflexes: 1+ bilaterally Cerebellar: Normal finger-to-nose testing (impaired on left due to strength)  Carotid auscultation: No bruit NIHSS 3  ASSESSMENT Benjamin Cohen is a 57 y.o. male presenting with L face and extremity weakness and dysarthria. Status post IV t-PA at 2:38 AM 12/3. 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 Infarct felt to be thrombotic secondary to small vessel disease.  On no anticoagulation prior to admission. Will start Aspirin after follow-up CT scan for secondary stroke prevention. Patient with resultant mild improvement in symptoms. Work up underway.   IR angiogram showed no large vessel disease, though scattered intracranial atherosclerosis  Repeat CT after TPA showed no hemorrhage  S/p TPA 12/3  A1C = 6.8, showing new diagnosis of DM  LDL = 140  Echo completed  Hospital day # 1  TREATMENT/PLAN  Continue aspirin 325 mg daily for secondary stroke prevention  MRI pending  PT/OT/SLP pending  Recheck BMET for K this morning  Keep SBP 140-180  Start atorvastatin 80  Transfer to floor  Janalyn Harder, [place] Pager: [phone] 11/13/2013 7:52 AM I have personally examined this patient, and reviewed data, and developed plan of care and agree with the above Delia Heady, MD

## 2013-11-13 NOTE — Progress Notes (Signed)
VASCULAR LAB PRELIMINARY  PRELIMINARY  PRELIMINARY  PRELIMINARY  Carotid duplex  completed.    Preliminary report:  Bilateral:  1-39% ICA stenosis.  Vertebral artery flow is antegrade.      Kellar Westberg, RVT 11/13/2013, 10:18 AM

## 2013-11-13 NOTE — Progress Notes (Signed)
Occupational Therapy Evaluation Patient Details Name: Benjamin Cohen MRN: [id number] DOB: August 05, 1956 Today's Date: 11/13/2013 Time: 3244-0102 OT Time Calculation (min): 34 min  OT Assessment / Plan / Recommendation History of present illness 57 yo M, history of HTN with medication non-compliance, presented 12/3 with left face, left arm, and left leg weakness, as well as dysarthria.  Patient last normal at 1:00 AM, given TPA at 2:38 AM on 12/3.  Symptoms only mildly improved.  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  BP Elevated to 202/120 on admission, and patient placed on nicardipine drip.  He was admitted to the neuro ICU for further evaluation and treatment. He had emergent cerebral catheter angiogram by Dr. Corliss Skains which did not show any large vessel occlusion.   Clinical Impression   PTA, pt lived alone and was independent with all ADL and mobility. 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 Pt presents with LUE weakness, coordination and balance deficits and apparent higher level cognitive deficits. Pt will benefit from short CIR stay to return to Mod I level to D/C home with intermettent S of sister. Pt will benefit from skilled OT services to facilitate D/C to next venue due to below deficits.    OT Assessment  Patient needs continued OT Services    Follow Up Recommendations  CIR    Barriers to Discharge      Equipment Recommendations  Tub/shower seat    Recommendations for Other Services Rehab consult  Frequency  Min 3X/week    Precautions / Restrictions Precautions Precautions: Fall   Pertinent Vitals/Pain no apparent distress     ADL  Eating/Feeding: Modified independent Grooming: Supervision/safety;Set up Where Assessed - Grooming: Supported standing Upper Body Bathing: Supervision/safety;Set up Where Assessed - Upper Body Bathing: Supported sitting Lower Body Bathing: Minimal  assistance Where Assessed - Lower Body Bathing: Supported sit to stand Upper Body Dressing: Minimal assistance Where Assessed - Upper Body Dressing: Supported sitting Lower Body Dressing: Minimal assistance Where Assessed - Lower Body Dressing: Supported sit to Pharmacist, hospital: Minimal assistance Toilet Transfer Method: Other (comment) (am) Equipment Used: Gait belt Transfers/Ambulation Related to ADLs: min A. L bias aat times ADL Comments: decreased safety at times    OT Diagnosis: Generalized weakness;Cognitive deficits  OT Problem List: Decreased strength;Decreased activity tolerance;Impaired balance (sitting and/or standing);Decreased coordination;Decreased cognition;Decreased safety awareness;Decreased knowledge of use of DME or AE;Cardiopulmonary status limiting activity;Impaired UE functional use OT Treatment Interventions: Self-care/ADL training;Therapeutic exercise;Neuromuscular education;Energy conservation;DME and/or AE instruction;Therapeutic activities;Cognitive remediation/compensation;Patient/family education;Balance training   OT Goals(Current goals can be found in the care plan section) Acute Rehab OT Goals Patient Stated Goal: To get back to work OT Goal Formulation: With patient Time For Goal Achievement: 11/27/13 Potential to Achieve Goals: Good  Visit Information  Last OT Received On: 11/13/13 History of Present Illness: 57 yo M, history of HTN with medication non-compliance, presented 12/3 with left face, left arm, and left leg weakness, as well as dysarthria.  Patient last normal at 1:00 AM, given TPA at 2:38 AM on 12/3.  Symptoms only mildly improved.  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  BP Elevated to 202/120 on admission, and patient placed on nicardipine drip.  He was admitted to the neuro ICU for further evaluation and treatment. He had emergent cerebral  catheter angiogram by Dr. Corliss Skains which  did not show any large vessel occlusion.       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 Communication Communication: Expressive difficulties (dysarthia) Dominant Hand: Right         Vision/Perception Vision - History Baseline Vision: No visual deficits Patient Visual Report: No change from baseline Vision - Assessment Eye Alignment: Within Functional Limits Vision Assessment: Vision tested Ocular Range of Motion: Within Functional Limits Alignment/Gaze Preference: Within Defined Limits Tracking/Visual Pursuits: Able to track stimulus in all quads without difficulty Saccades: Within functional limits Convergence: Within functional limits Visual Fields: No apparent deficits Perception Perception: Within Functional Limits Praxis Praxis: Intact   Cognition  Cognition Arousal/Alertness: Awake/alert Behavior During Therapy: WFL for tasks assessed/performed Overall Cognitive Status: Impaired/Different from baseline Area of Impairment: Attention;Safety/judgement;Problem solving Current Attention Level: Selective Memory: Decreased short-term memory (did not remember boing up in chair today) Safety/Judgement: Decreased awareness of safety Problem Solving: Slow processing;Difficulty sequencing    Extremity/Trunk Assessment Upper Extremity Assessment Upper Extremity Assessment: LUE deficits/detail LUE Deficits / Details: improving m,ovment Brunstrom stagge VI - isolated movement (weakness) LUE Sensation:  (pt staes arm feels normal) LUE Coordination: decreased fine motor;decreased gross  motor Lower Extremity Assessment Lower Extremity Assessment: Defer to PT evaluation LLE Deficits / Details: MMT wfl however impaired sensation LLE Sensation: decreased light touch;decreased proprioception Cervical / Trunk Assessment Cervical / Trunk Assessment: Other exceptions Cervical / Trunk Exceptions: loses balance to L at times     Mobility Bed Mobility Bed Mobility: Supine to Sit;Sitting - Scoot to Edge of Bed Supine to Sit: 4: Min assist;HOB flat Sitting - Scoot to [place] of Bed: 4: Min assist Details for Bed Mobility Assistance: (A) to elevate trunk OOB with cues for correct technique.  Transfers Transfers: Sit to Stand;Stand to Sit Sit to Stand: 4: Min assist;From bed Stand to Sit: 4: Min assist;To chair/3-in-1 Details for Transfer Assistance: unsafe. unsteady     Exercise     Balance Balance Balance Assessed: Yes Static Sitting Balance Static Sitting - Balance Support: Feet supported Static Sitting - Level of Assistance: 5: Stand by assistance Static Standing Balance Static Standing - Balance Support: Bilateral upper extremity supported Static Standing - Level of Assistance: 4: Min assist   End of Session OT - End of Session Equipment Utilized During Treatment: Gait belt Activity Tolerance: Patient tolerated treatment well Patient left: in chair;with call bell/phone within reach Nurse Communication: Mobility status  GO     Sherea Liptak,HILLARY 11/13/2013, 5:49 PM [facility], OTR/L  [phone] 11/13/2013

## 2013-11-13 NOTE — Consult Note (Signed)
Physical Medicine and Rehabilitation Consult Reason for Consult: CVA Referring Physician: Dr. Pearlean Brownie   HPI: Benjamin Cohen is a 57 y.o. right-handed male with history of hypertension and poor medical compliance admitted 11/12/2013 with acute onset of left facial droop, slurred speech and left-sided weakness. Patient was independent prior to admission working full-time. Cranial CT scan confirmed a left caudate nucleus and basal ganglia infarct extending into the left corona radiata. Patient did receive TPA. MRI MRA with acute right corona radiata/lentiform nucleus and remote left BG infarct.  Echocardiogram with ejection fraction of 60% grade 1 diastolic dysfunction. Carotid Dopplers with no ICA stenosis. CT angiogram showed no stenosis or occlusion. Placed on aspirin for CVA prophylaxis. Close monitoring of blood pressure maintain on intravenous Cardene. Physical therapy evaluation 11/13/2013 with recommendations of physical medicine rehabilitation consult to consider inpatient rehabilitation services.   Review of Systems  Neurological: Positive for tingling, speech change and headaches.  All other systems reviewed and are negative.   History reviewed. No pertinent past medical history. History reviewed. No pertinent past surgical history. History reviewed. No pertinent family history. Social History:  reports that he has never smoked. He does not have any smokeless tobacco history on file. His alcohol and drug histories are not on file. 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 Functional Status:  Mobility: Bed Mobility Bed Mobility: Supine to Sit;Sitting - Scoot to Edge of Bed Supine to Sit: 4: Min assist;HOB flat Sitting - Scoot to [place] of Bed: 4: Min assist Transfers Transfers: Sit to Stand;Stand to Sit Sit to Stand: 1: +2 Total assist;From bed Sit to Stand: Patient Percentage: 70% Stand to Sit: 1: +2 Total assist;To chair/3-in-1 Stand to Sit: Patient Percentage: 70% Ambulation/Gait Ambulation/Gait Assistance: 1: +2 Total assist Ambulation/Gait: Patient Percentage: 70% Ambulation Distance (Feet): 30 Feet Assistive device: 2 person hand held assist Ambulation/Gait Assistance Details: HHA to maintain balance with reports of dizziness with ambulation.  Pt increase lateral sway and needs intermittent rest breaks due to dizziness.  Gait Pattern: Step-through pattern;Decreased stride length;Decreased stance time - left;Shuffle;Lateral trunk lean to left Gait velocity: decrease Stairs: No    ADL:    Cognition: Cognition Overall Cognitive Status: Impaired/Different from baseline Arousal/Alertness: Awake/alert Orientation Level: Oriented X4 Attention: Focused;Sustained;Selective Focused Attention: Appears intact Sustained Attention: Impaired Sustained Attention Impairment: Verbal complex;Functional complex Selective Attention: Impaired Selective Attention Impairment: Verbal complex;Functional complex Memory: Impaired Awareness: Impaired Awareness Impairment: Intellectual impairment;Emergent impairment Problem Solving: Impaired Problem Solving Impairment: Verbal complex (functional complex not tested yet - pt not able to sit up ) Executive Function: Sequencing;Reasoning Reasoning: Impaired Reasoning Impairment: Verbal complex;Functional complex Sequencing: Impaired Sequencing Impairment: Verbal complex;Functional complex Behaviors: Restless Safety/Judgment: Impaired Cognition Arousal/Alertness: Awake/alert Behavior During Therapy: WFL for  tasks assessed/performed Overall Cognitive Status: Impaired/Different from baseline Area of Impairment: Attention;Safety/judgement;Problem solving Current Attention Level: Selective Safety/Judgement: Decreased awareness of safety Problem Solving: Slow processing;Difficulty sequencing  Blood pressure 138/91, pulse 87, temperature 97.5 F (36.4 C), temperature source Oral, resp. rate 24, height 5\' 8"  (1.727 m), weight 98.4 kg (216 lb 14.9 oz), SpO2 97.00%. Physical Exam  Vitals reviewed. Constitutional: He is oriented to person, place, and time. He  appears well-developed.  HENT:  Mild left facial droop  Eyes: EOM are normal.  Neck: Normal range of motion. Neck supple. No thyromegaly present.  Cardiovascular: Normal rate and regular rhythm.   Respiratory: Effort normal and breath sounds normal. No respiratory distress.  GI: Soft. Bowel sounds are normal. He exhibits no distension.  Neurological: He is alert and oriented to person, place, and time.  Follows full commands. Mild dysarthric speech but intelligible. LUE is 3+ to 4- prox to distal. LLE is 4/5. Left facial droop and tongue deviation. Vision a little blurred. Gaze is conjugate. Good insight and awareness.   Skin: Skin is warm and dry.  Psychiatric: He has a normal mood and affect. His behavior is normal. Judgment and thought content normal.  Mood is flat but appropriate    Results for orders placed during the hospital encounter of 11/12/13 (from the past 24 hour(s))  BASIC METABOLIC PANEL     Status: Abnormal   Collection Time    11/12/13 11:55 AM      Result Value Range   Sodium 143  135 - 145 mEq/L   Potassium 3.5  3.5 - 5.1 mEq/L   Chloride 104  96 - 112 mEq/L   CO2 25  19 - 32 mEq/L   Glucose, Bld 123 (*) 70 - 99 mg/dL   BUN 9  6 - 23 mg/dL   Creatinine, Ser 4.09  0.50 - 1.35 mg/dL   Calcium 8.6  8.4 - 81.1 mg/dL   GFR calc non Af Amer >90  >90 mL/min   GFR calc Af Amer >90  >90 mL/min  MAGNESIUM     Status: None    Collection Time    11/12/13 11:55 AM      Result Value Range   Magnesium 1.8  1.5 - 2.5 mg/dL   Ct Head Wo Contrast  11/12/2013   CLINICAL DATA:  Left arm weakness, followup after tPA  EXAM: CT HEAD WITHOUT CONTRAST  TECHNIQUE: Contiguous axial images were obtained from the base of the skull through the vertex without intravenous contrast.  COMPARISON:  Prior angiogram and CT performed earlier on the same day.  FINDINGS: Again seen is a sequelae of probable remote infarct involving the left hepatic head with extension into the left coronal radiata, not significantly changed relative to the prior study. There is associated mild ex vacuo dilatation of the left lateral ventricle.  A new hypodensity measuring 1.2 x 0.9 cm is seen involving the right she came in, consistent with acute infarct. There is question of extension into the right corona radiata (series 3, image 15). No acute intracranial hemorrhage identified. No other definite acute intracranial infarct. No mass or midline shift. No hydrocephalus. No extra-axial fluid collection. Calvarium is intact. Orbits are normal.  Paranasal sinuses and mastoid air cells are clear.  IMPRESSION: 1. No evidence of intracranial hemorrhage status post tPA administration. 2. New 1.2 x 0.9 cm hypodensity involving the right putamen with extension into the right corona radiata, consistent with acute ischemic infarct. 3. Stable appearance of hypodensity involving the left caudate head with extension into the left coronal radiata. Again, this finding is favored to be chronic in nature.   Electronically Signed   By: Rise Mu M.D.   On: 11/12/2013 23:21   Ct Head Wo Contrast  11/12/2013   CLINICAL DATA:  Left-sided weakness and tingling.  EXAM: CT HEAD WITHOUT CONTRAST  TECHNIQUE: Contiguous axial images were obtained from the base of the skull through  the vertex without intravenous contrast.  COMPARISON:  None.  FINDINGS: Sinuses/Soft tissues: Mucosal thickening  of right maxillary sinus. Clear mastoid air cells.  Intracranial: Mild low density in the periventricular white matter likely related to small vessel disease. Vague hypoattenuation within the left caudate head extending into the lateral aspect of the basal ganglia and corona radiata. No significant mass effect. Minimal ex vacuo dilatation of the anterior horn of the left lateral ventricle.  No hemorrhage, hydrocephalus, intra-axial, or extra-axial fluid collection.  IMPRESSION: 1. Hypoattenuation within the left caudate head and basal ganglia, extending into the left corona radiata. Favored to be remote, given absence of mass effect and resultant ex vacuo dilatation of the anterior horn lateral ventricle. These results were called by telephone at the time of interpretation on 11/12/2013 at 2:21 AM to Dr. Roseanne Reno, who verbally acknowledged these results. 2. Mild small vessel ischemic change. 3. Sinus disease.   Electronically Signed   By: Jeronimo Greaves M.D.   On: 11/12/2013 02:23   Dg Chest Port 1 View  11/12/2013   CLINICAL DATA:  Stroke  EXAM: PORTABLE CHEST - 1 VIEW  COMPARISON:  None.  FINDINGS: Low lung volumes are present, causing crowding of the pulmonary vasculature. Accounting for the low lung volumes, there is borderline cardiomegaly. Mild thoracic spondylosis. The lungs appear clear.  IMPRESSION: 1. Borderline cardiomegaly. 2. Low lung volumes.   Electronically Signed   By: Herbie Baltimore M.D.   On: 11/12/2013 09:56   Ir Angio Intra Extracran Sel Com Carotid Innominate Bilat Mod Sed  11/12/2013   CLINICAL DATA:  Acute onset of left-sided hemiparesis left arm greater than left leg. Gaze deviation. Severe dysarthria.  EXAM: BILATERAL COMMON CAROTID AND INNOMINATE ANGIOGRAPHY AND BILATERAL VERTEBRAL ARTERY ANGIOGRAMS:  ANESTHESIA/SEDATION: Modified sedation  MEDICATIONS:  Versed 1 mg IV. Fentanyl 25 mcg IV.  CONTRAST:  70mL OMNIPAQUE IOHEXOL 300 MG/ML  SOLN  PROCEDURE: Following a full explanation of  the procedure along with the potential associated complications, an informed witnessed consent was obtained from patient's sister and son.  The right groin was prepped and draped in the usual sterile fashion. Thereafter, using a modified Seldinger technique, transfemoral access into right common femoral artery was obtained without difficulty. Over a 0.035-inch guidewire, a 5 [place] Pinnacle sheath was inserted. Through this and also over a 0.035-inch guidewire, a 5 [place] JB1 catheter was advanced to the aortic arch region and selectively positioned in the right common carotid artery, the right vertebral artery, the left common carotid artery and left vertebral artery.  There were no acute complications. The patient tolerated the procedure well.  COMPLICATIONS: None immediate.  FINDINGS: The right common carotid arteriogram demonstrates the right external carotid artery and its major branches to be normal.  The right internal carotid artery at the bulb to the cranial skull base opacifies normally.  The petrous, cavernous and supraclinoid segments are widely patent. A dominant right posterior communicating artery is seen opacifying the right posterior cerebral artery distribution.  The right middle cerebral artery is seen to opacify into the capillary and venous phases. Scattered focal areas of mild irregularity suggest intracranial arteriosclerosis.  There is no opacification of the right anterior cerebral artery A1 segment.  The right vertebral artery origin is normal.  The vessel opacifies normally to the cranial skull base.  There is normal opacification of the hypoplastic right posterior-inferior cerebellar artery.  The basilar artery, the left posterior cerebral artery, the superior cerebral arteries and the anterior inferior cerebral arteries  opacify normally into the capillary and venous phases. The right posterior cerebral artery has poor opacification secondary to mixing with the non-opacified blood from  the anterior circulation as described above.  The left common carotid arteriogram demonstrates the left external carotid artery and its major branch to be normal.  The left internal carotid artery at the bulb to the cranial skull base opacifies normally.  The petrous, cavernous and supraclinoid segments are widely patent. Again seen is a left posterior communicating artery with partial opacification of the left posterior inferior cerebellar artery distribution.  The left middle and the left anterior cerebral arteries opacify into the capillary and venous phases.  Caliber irregularity is seen of the pericallosal and the callosal marginal branches suggestive of intracranial arteriosclerosis.  Cross filling via the anterior communicating artery of the right anterior cerebral artery A2 segment is seen. The left vertebral artery origin is from the thyrocervical trunk.  The vessel opacifies normally to the cranial skull base.  There is normal opacification of the left vertebral artery to the skull base. At the level of approximately C2 there is fenestration of the right vertebral artery 2nd vertical segment, a developmental variation.  Distal to this the left vertebrobasilar junction is seen to opacify normally. Normal opacification is seen of the left posterior inferior cerebellar artery.  The opacified portion of the basilar artery, the posterior cerebral arteries, the superior cerebellar arteries and the anterior inferior cerebellar arteries is grossly normal into the capillary and venous phases. Significant mixing with non-opacified blood from the contralateral vertebral artery is seen. Also visualized is the opacification of the anterior spinal artery at the level of C1-C2.  IMPRESSION: Angiographically no evidence of gross intraluminal filling defects, occlusions, stenoses, dissections or of aneurysms or dural AV malformations.  Scattered focal areas of caliber irregularity in the anterior circulation as described  most likely representing intracranial arteriosclerosis.   Electronically Signed   By: Julieanne Cotton M.D.   On: 11/12/2013 09:11    Assessment/Plan: Diagnosis: right corona radiata/BG infarct 1. Does the need for close, 24 hr/day medical supervision in concert with the patient's rehab needs make it unreasonable for this patient to be served in a less intensive setting? Potentially 2. Co-Morbidities requiring supervision/potential complications: htn, dm 3. Due to bladder management, bowel management, safety, skin/wound care, disease management, medication administration, pain management and patient education, does the patient require 24 hr/day rehab nursing? Yes 4. Does the patient require coordinated care of a physician, rehab nurse, PT (1-2 hrs/day, 5 days/week), OT (1-2 hrs/day, 5 days/week) and SLP (1-2 hrs/day, 5 days/week) to address physical and functional deficits in the context of the above medical diagnosis(es)? Potentially Addressing deficits in the following areas: balance, endurance, locomotion, strength, transferring, bowel/bladder control, bathing, dressing, feeding, grooming, toileting, cognition, speech and psychosocial support 5. Can the patient actively participate in an intensive therapy program of at least 3 hrs of therapy per day at least 5 days per week? Yes 6. The potential for patient to make measurable gains while on inpatient rehab is good and fair 7. Anticipated functional outcomes upon discharge from inpatient rehab are mod I with PT, mod I with OT, mod I with SLP. 8. Estimated rehab length of stay to reach the above functional goals is: one week if needed 9. Does the patient have adequate social supports to accommodate these discharge functional goals? Potentially 10. Anticipated D/C setting: Home 11. Anticipated post D/C treatments: HH therapy 12. Overall Rehab/Functional Prognosis: excellent  RECOMMENDATIONS: This patient's condition  is appropriate for continued  rehabilitative care in the following setting: CIR vs HH therapy Patient has agreed to participate in recommended program. Yes and Potentially Note that insurance prior authorization may be required for reimbursement for recommended care.  Comment: Admitted 12/3. Making neurological gains. Would like to see how much further he progressed this weekend. Rehab RN to follow up.   Ranelle Oyster, MD, [place] [facility]     11/13/2013

## 2013-11-13 NOTE — Progress Notes (Signed)
Subjective: Patient is s/p TPA and diagnostic cerebral arteriogram after having left facial droop, left extremity weakness, and mild dysarthia. Patient states his strength and speech is improving today. He denies any right groin pain, abdominal pain or bleeding.   Objective: Physical Exam: BP 138/91  Pulse 87  Temp(Src) 97.5 F (36.4 C) (Oral)  Resp 24  Ht 5\' 8"  (1.727 m)  Wt 216 lb 14.9 oz (98.4 kg)  BMI 32.99 kg/m2  SpO2 97%  Neuro: CN 2-12 intact, strength equal bilateral upper and lower ext., speech improving Ext: Right femoral access site dressing C/D/I, no bleeding, hematoma or infection, DP intact bilaterally.  Labs: CBC  Recent Labs  11/12/13 0159 11/12/13 0205  WBC 7.5  --   HGB 15.1 16.7  HCT 45.0 49.0  PLT 244  --    BMET  Recent Labs  11/12/13 0159 11/12/13 0205 11/12/13 1155  NA 142 143 143  K 2.4* 2.1* 3.5  CL 100 102 104  CO2 21  --  25  GLUCOSE 171* 178* 123*  BUN 13 12 9   CREATININE 1.03 1.40* 0.84  CALCIUM 8.8  --  8.6   LFT  Recent Labs  11/12/13 0159  PROT 7.4  ALBUMIN 4.2  AST 22  ALT 22  ALKPHOS 91  BILITOT 0.4   PT/INR  Recent Labs  11/12/13 0159  LABPROT 11.9  INR 0.89     Studies/Results: Ct Head Wo Contrast  11/12/2013   CLINICAL DATA:  Left arm weakness, followup after tPA  EXAM: CT HEAD WITHOUT CONTRAST  TECHNIQUE: Contiguous axial images were obtained from the base of the skull through the vertex without intravenous contrast.  COMPARISON:  Prior angiogram and CT performed earlier on the same day.  FINDINGS: Again seen is a sequelae of probable remote infarct involving the left hepatic head with extension into the left coronal radiata, not significantly changed relative to the prior study. There is associated mild ex vacuo dilatation of the left lateral ventricle.  A new hypodensity measuring 1.2 x 0.9 cm is seen involving the right she came in, consistent with acute infarct. There is question of extension into the  right corona radiata (series 3, image 15). No acute intracranial hemorrhage identified. No other definite acute intracranial infarct. No mass or midline shift. No hydrocephalus. No extra-axial fluid collection. Calvarium is intact. Orbits are normal.  Paranasal sinuses and mastoid air cells are clear.  IMPRESSION: 1. No evidence of intracranial hemorrhage status post tPA administration. 2. New 1.2 x 0.9 cm hypodensity involving the right putamen with extension into the right corona radiata, consistent with acute ischemic infarct. 3. Stable appearance of hypodensity involving the left caudate head with extension into the left coronal radiata. Again, this finding is favored to be chronic in nature.   Electronically Signed   By: Rise Mu M.D.   On: 11/12/2013 23:21   Ct Head Wo Contrast  11/12/2013   CLINICAL DATA:  Left-sided weakness and tingling.  EXAM: CT HEAD WITHOUT CONTRAST  TECHNIQUE: Contiguous axial images were obtained from the base of the skull through the vertex without intravenous contrast.  COMPARISON:  None.  FINDINGS: Sinuses/Soft tissues: Mucosal thickening of right maxillary sinus. Clear mastoid air cells.  Intracranial: Mild low density in the periventricular white matter likely related to small vessel disease. Vague hypoattenuation within the left caudate head extending into the lateral aspect of the basal ganglia and corona radiata. No significant mass effect. Minimal ex vacuo dilatation of the anterior horn  of the left lateral ventricle.  No hemorrhage, hydrocephalus, intra-axial, or extra-axial fluid collection.  IMPRESSION: 1. Hypoattenuation within the left caudate head and basal ganglia, extending into the left corona radiata. Favored to be remote, given absence of mass effect and resultant ex vacuo dilatation of the anterior horn lateral ventricle. These results were called by telephone at the time of interpretation on 11/12/2013 at 2:21 AM to Dr. Roseanne Reno, who verbally  acknowledged these results. 2. Mild small vessel ischemic change. 3. Sinus disease.   Electronically Signed   By: Jeronimo Greaves M.D.   On: 11/12/2013 02:23   Dg Chest Port 1 View  11/12/2013   CLINICAL DATA:  Stroke  EXAM: PORTABLE CHEST - 1 VIEW  COMPARISON:  None.  FINDINGS: Low lung volumes are present, causing crowding of the pulmonary vasculature. Accounting for the low lung volumes, there is borderline cardiomegaly. Mild thoracic spondylosis. The lungs appear clear.  IMPRESSION: 1. Borderline cardiomegaly. 2. Low lung volumes.   Electronically Signed   By: Herbie Baltimore M.D.   On: 11/12/2013 09:56   Ir Angio Intra Extracran Sel Com Carotid Innominate Bilat Mod Sed  11/12/2013   CLINICAL DATA:  Acute onset of left-sided hemiparesis left arm greater than left leg. Gaze deviation. Severe dysarthria.  EXAM: BILATERAL COMMON CAROTID AND INNOMINATE ANGIOGRAPHY AND BILATERAL VERTEBRAL ARTERY ANGIOGRAMS:  ANESTHESIA/SEDATION: Modified sedation  MEDICATIONS:  Versed 1 mg IV. Fentanyl 25 mcg IV.  CONTRAST:  70mL OMNIPAQUE IOHEXOL 300 MG/ML  SOLN  PROCEDURE: Following a full explanation of the procedure along with the potential associated complications, an informed witnessed consent was obtained from patient's sister and son.  The right groin was prepped and draped in the usual sterile fashion. Thereafter, using a modified Seldinger technique, transfemoral access into right common femoral artery was obtained without difficulty. Over a 0.035-inch guidewire, a 5 [place] Pinnacle sheath was inserted. Through this and also over a 0.035-inch guidewire, a 5 [place] JB1 catheter was advanced to the aortic arch region and selectively positioned in the right common carotid artery, the right vertebral artery, the left common carotid artery and left vertebral artery.  There were no acute complications. The patient tolerated the procedure well.  COMPLICATIONS: None immediate.  FINDINGS: The right common carotid arteriogram  demonstrates the right external carotid artery and its major branches to be normal.  The right internal carotid artery at the bulb to the cranial skull base opacifies normally.  The petrous, cavernous and supraclinoid segments are widely patent. A dominant right posterior communicating artery is seen opacifying the right posterior cerebral artery distribution.  The right middle cerebral artery is seen to opacify into the capillary and venous phases. Scattered focal areas of mild irregularity suggest intracranial arteriosclerosis.  There is no opacification of the right anterior cerebral artery A1 segment.  The right vertebral artery origin is normal.  The vessel opacifies normally to the cranial skull base.  There is normal opacification of the hypoplastic right posterior-inferior cerebellar artery.  The basilar artery, the left posterior cerebral artery, the superior cerebral arteries and the anterior inferior cerebral arteries opacify normally into the capillary and venous phases. The right posterior cerebral artery has poor opacification secondary to mixing with the non-opacified blood from the anterior circulation as described above.  The left common carotid arteriogram demonstrates the left external carotid artery and its major branch to be normal.  The left internal carotid artery at the bulb to the cranial skull base opacifies normally.  The petrous, cavernous and  supraclinoid segments are widely patent. Again seen is a left posterior communicating artery with partial opacification of the left posterior inferior cerebellar artery distribution.  The left middle and the left anterior cerebral arteries opacify into the capillary and venous phases.  Caliber irregularity is seen of the pericallosal and the callosal marginal branches suggestive of intracranial arteriosclerosis.  Cross filling via the anterior communicating artery of the right anterior cerebral artery A2 segment is seen. The left vertebral artery  origin is from the thyrocervical trunk.  The vessel opacifies normally to the cranial skull base.  There is normal opacification of the left vertebral artery to the skull base. At the level of approximately C2 there is fenestration of the right vertebral artery 2nd vertical segment, a developmental variation.  Distal to this the left vertebrobasilar junction is seen to opacify normally. Normal opacification is seen of the left posterior inferior cerebellar artery.  The opacified portion of the basilar artery, the posterior cerebral arteries, the superior cerebellar arteries and the anterior inferior cerebellar arteries is grossly normal into the capillary and venous phases. Significant mixing with non-opacified blood from the contralateral vertebral artery is seen. Also visualized is the opacification of the anterior spinal artery at the level of C1-C2.  IMPRESSION: Angiographically no evidence of gross intraluminal filling defects, occlusions, stenoses, dissections or of aneurysms or dural AV malformations.  Scattered focal areas of caliber irregularity in the anterior circulation as described most likely representing intracranial arteriosclerosis.   Electronically Signed   By: Julieanne Cotton M.D.   On: 11/12/2013 09:11    Assessment/Plan: Left hemiparesis, improving Dysarthria, improving S/p TPA and diagnostic cerebral arteriogram, no intervention performed.  All plans per Neurology.    LOS: 1 day    Berneta Levins PA-C 11/13/2013 9:50 AM

## 2013-11-13 NOTE — Progress Notes (Signed)
Rehab Admissions Coordinator Note:  Patient was screened by Clois Dupes for appropriateness for an Inpatient Acute Rehab Consult.  At this time, we are recommending Inpatient Rehab consult. I will order.  Clois Dupes 11/13/2013, 10:29 AM  I can be reached at [phone].

## 2013-11-13 NOTE — Evaluation (Signed)
Physical Therapy Evaluation Patient Details Name: Benjamin Cohen MRN: [id number] DOB: Nov 05, 1956 Today's Date: 11/13/2013 Time: 4098-1191 PT Time Calculation (min): 32 min  PT Assessment / Plan / Recommendation History of Present Illness  57 yo Cohen, history of HTN with medication non-compliance, presented 12/3 with left face, left arm, and left leg weakness, as well as dysarthria.  Patient last normal at 1:00 AM, given TPA at 2:38 AM on 12/3.  Symptoms only mildly improved.  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  BP Elevated to 202/120 on admission, and patient placed on nicardipine drip.  He was admitted to the neuro ICU for further evaluation and treatment. He had emergent cerebral catheter angiogram by Dr. Corliss Skains which did not show any large vessel occlusion.  Clinical Impression  Pt admitted with the above. Pt currently with functional limitations due to the deficits listed below (see PT Problem List). Pt with impaired safety awareness and needs extra time to complete task.  Pt was independent prior to admission and would benefit from inpatient rehab to reach mod (I) prior to d/c home.  Pt will benefit from skilled PT to increase their independence and safety with mobility to allow discharge to the venue listed below.      PT Assessment  Patient needs continued PT services    Follow Up Recommendations  CIR    Equipment Recommendations  Rolling walker with 5" wheels    Recommendations for Other Services Rehab consult   Frequency Min 4X/week    Precautions / Restrictions Precautions Precautions: Fall   Pertinent Vitals/Pain No c/o pain; VSS      Mobility  Bed Mobility Bed Mobility: Supine to Sit;Sitting - Scoot to Edge of Bed Supine to Sit: 4: Min assist;HOB flat Sitting - Scoot to [place] of Bed: 4: Min assist Details for Bed Mobility Assistance: (A) to elevate trunk OOB with cues for  correct technique.  Transfers Transfers: Sit to Stand;Stand to Sit Sit to Stand: 1: +2 Total assist;From bed Sit to Stand: Patient Percentage: 70% Stand to Sit: 1: +2 Total assist;To chair/3-in-1 Stand to Sit: Patient Percentage: 70% Details for Transfer Assistance: +2 (A) to initiate transfer and to slowly descend to recliner with max cues for technique.  Ambulation/Gait Ambulation/Gait Assistance: 1: +2 Total assist Ambulation/Gait: Patient Percentage: 70% Ambulation Distance (Feet): 30 Feet Assistive device: 2 person hand held assist Ambulation/Gait Assistance Details: HHA to maintain balance with reports of dizziness with ambulation.  Pt increase lateral sway and needs intermittent rest breaks due to dizziness.  Gait Pattern: Step-through pattern;Decreased stride length;Decreased stance time - left;Shuffle;Lateral trunk lean to left Gait velocity: decrease Stairs: No Modified Rankin (Stroke Patients Only) Pre-Morbid Rankin Score: No symptoms Modified Rankin: Severe disability    Exercises     PT Diagnosis: Difficulty walking;Generalized weakness;Abnormality of gait  PT Problem List: Decreased activity tolerance;Decreased balance;Decreased mobility;Decreased coordination;Decreased cognition;Decreased knowledge of use of DME;Impaired sensation PT Treatment Interventions: DME instruction;Gait training;Functional mobility training;Therapeutic activities;Therapeutic exercise;Balance training;Stair training;Neuromuscular re-education;Cognitive remediation;Patient/family education     PT Goals(Current goals can be found in the care plan section) Acute Rehab PT Goals Patient Stated Goal: To get back to work PT Goal Formulation: With patient Time For Goal Achievement: 11/20/13 Potential to Achieve Goals: Good  Visit Information  Last PT Received On: 11/13/13 History of Present Illness: Benjamin Cohen, history of HTN with medication non-compliance, presented 12/3 with left face, left arm,  and  left leg weakness, as well as dysarthria.  Patient last normal at 1:00 AM, given TPA at 2:38 AM on 12/3.  Symptoms only mildly improved.  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  BP Elevated to 202/120 on admission, and patient placed on nicardipine drip.  He was admitted to the neuro ICU for further evaluation and treatment. He had emergent cerebral catheter angiogram by Dr. Corliss Skains which did not show any large vessel occlusion.       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 Communication Communication: Expressive difficulties (dysarthia) Dominant Hand: Right    Cognition  Cognition Arousal/Alertness: Awake/alert Behavior During Therapy: WFL for tasks assessed/performed Overall Cognitive Status: Impaired/Different from baseline Area of Impairment: Attention;Safety/judgement;Problem solving Current Attention Level: Selective Safety/Judgement: Decreased awareness of safety Problem Solving: Slow processing;Difficulty sequencing    Extremity/Trunk Assessment Upper Extremity Assessment Upper Extremity Assessment: Defer to OT evaluation Lower Extremity Assessment Lower Extremity Assessment: LLE deficits/detail LLE Deficits / Details: MMT wfl however impaired sensation LLE Sensation: decreased light touch;decreased proprioception   Balance Balance Balance Assessed: Yes Static Sitting Balance Static Sitting - Balance Support: Feet unsupported Static Sitting - Level of Assistance: 4: Min assist;5: Stand by assistance Static Sitting - Comment/# of  Minutes: Intermittent (A) due to left lean during MMT.  Static Standing Balance Static Standing - Balance Support: Bilateral upper extremity supported Static Standing - Level of Assistance: 1: +2 Total assist;Patient percentage (comment) Static Standing - Comment/# of Minutes: (A) to maintain balance with relying on LE against bed to maintain balance initially.   End of Session PT - End of Session Equipment Utilized During Treatment: Gait belt Activity Tolerance: Patient tolerated treatment well Patient left: with call bell/phone within reach;in chair Nurse Communication: Mobility status  GP     Welda Azzarello 11/13/2013, 9:Benjamin AM  Jake Shark, PT DPT [phone]

## 2013-11-13 NOTE — Progress Notes (Signed)
Pt transferred from [facility] 09, denies any discomfort,pt settled in bed,pt called his family to notify them about his room change,call light at bedside,will however continue to monitor. Obasogie-Asidi, Tiwana Chavis Efe

## 2013-11-14 DIAGNOSIS — E1165 Type 2 diabetes mellitus with hyperglycemia: Secondary | ICD-10-CM | POA: Diagnosis present

## 2013-11-14 DIAGNOSIS — E785 Hyperlipidemia, unspecified: Secondary | ICD-10-CM | POA: Diagnosis present

## 2013-11-14 DIAGNOSIS — I6330 Cerebral infarction due to thrombosis of unspecified cerebral artery: Secondary | ICD-10-CM

## 2013-11-14 DIAGNOSIS — E876 Hypokalemia: Secondary | ICD-10-CM | POA: Diagnosis present

## 2013-11-14 DIAGNOSIS — E119 Type 2 diabetes mellitus without complications: Secondary | ICD-10-CM | POA: Diagnosis present

## 2013-11-14 LAB — GLUCOSE, CAPILLARY: Glucose-Capillary: 116 mg/dL — ABNORMAL HIGH (ref 70–99)

## 2013-11-14 MED ORDER — POTASSIUM CHLORIDE CRYS ER 20 MEQ PO TBCR
20.0000 meq | EXTENDED_RELEASE_TABLET | Freq: Once | ORAL | Status: AC
  Administered 2013-11-14: 20 meq via ORAL
  Filled 2013-11-14: qty 1

## 2013-11-14 NOTE — Progress Notes (Signed)
Stroke Team Progress Note  HISTORY 57 yo M, history of HTN with medication non-compliance, presented 12/3 with left face, left arm, and left leg weakness, as well as dysarthria.  Patient last normal at 1:00 AM, given TPA at 2:38 AM on 12/3.  Symptoms only mildly improved.  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  BP Elevated to 202/120 on admission, and patient placed on nicardipine drip.  He was admitted to the neuro ICU for further evaluation and treatment. He had emergent cerebral catheter angiogram by Dr. Corliss Skains which did not show any large vessel occlusion.  SUBJECTIVE NT in room. Pt in bathroom, standing, doing personal care.  OBJECTIVE Most recent Vital Signs: Filed Vitals:   11/13/13 2240 11/14/13 0050 11/14/13 0637 11/14/13 0942  BP: 165/110 171/103 144/94 161/94  Pulse:  64 66 72  Temp:  97.8 F (36.6 C) 97.7 F (36.5 C) 98 F (36.7 C)  TempSrc:  Oral Oral Oral  Resp:  18 18 18   Height:      Weight:      SpO2:  98% 98% 97%   CBG (last 3)   Recent Labs  11/12/13 0304 11/14/13 0737  GLUCAP 182* 116*    IV Fluid Intake:   . sodium chloride 75 mL/hr at 11/13/13 0523  . sodium chloride 75 mL/hr at 11/13/13 1000    MEDICATIONS  . aspirin EC  325 mg Oral Daily  . atorvastatin  80 mg Oral q1800  . pantoprazole  40 mg Oral Q1200   PRN:  acetaminophen, acetaminophen, labetalol, LORazepam  Diet:  Cardiac thin liquids Activity:  Up with assistance DVT Prophylaxis:  SCD's  CLINICALLY SIGNIFICANT STUDIES Basic Metabolic Panel:   Recent Labs Lab 11/12/13 0205 11/12/13 1155 11/13/13 0940  NA 143 143 141  K 2.1* 3.5 3.3*  CL 102 104 104  CO2  --  25 26  GLUCOSE 178* 123* 141*  BUN 12 9 12   CREATININE 1.40* 0.84 0.88  CALCIUM  --  8.6 8.6  MG  --  1.8  --    Liver Function Tests:   Recent Labs Lab 11/12/13 0159  AST 22  ALT 22  ALKPHOS 91  BILITOT 0.4   PROT 7.4  ALBUMIN 4.2   CBC:   Recent Labs Lab 11/12/13 0159 11/12/13 0205  WBC 7.5  --   NEUTROABS 1.2*  --   HGB 15.1 16.7  HCT 45.0 49.0  MCV 82.9  --   PLT 244  --    Coagulation:   Recent Labs Lab 11/12/13 0159  LABPROT 11.9  INR 0.89   Cardiac Enzymes:   Recent Labs Lab 11/12/13 0200  TROPONINI <0.30   Urinalysis:   Recent Labs Lab 11/12/13 0407  COLORURINE YELLOW  LABSPEC 1.013  PHURINE 5.5  GLUCOSEU NEGATIVE  HGBUR SMALL*  BILIRUBINUR NEGATIVE  KETONESUR NEGATIVE  PROTEINUR 100*  UROBILINOGEN 0.2  NITRITE NEGATIVE  LEUKOCYTESUR NEGATIVE   Lipid Panel     Component Value Date/Time   CHOL 216* 11/12/2013 0700   TRIG 157* 11/12/2013 0700   HDL 45 11/12/2013 0700   CHOLHDL 4.8 11/12/2013 0700   VLDL 31 11/12/2013 0700   LDLCALC 140* 11/12/2013 0700   HgbA1C  Lab Results  Component Value Date   HGBA1C 6.8* 11/12/2013    Urine Drug Screen:     Component Value Date/Time   LABOPIA NONE DETECTED 11/12/2013 0408  COCAINSCRNUR NONE DETECTED 11/12/2013 0408   LABBENZ NONE DETECTED 11/12/2013 0408   AMPHETMU NONE DETECTED 11/12/2013 0408   THCU NONE DETECTED 11/12/2013 0408   LABBARB NONE DETECTED 11/12/2013 0408    Alcohol Level:   Recent Labs Lab 11/12/13 0159  ETH <11     CT of the brain   11/12/2013   1. No evidence of intracranial hemorrhage status post tPA administration. 2. New 1.2 x 0.9 cm hypodensity involving the right putamen with extension into the right corona radiata, consistent with acute ischemic infarct. 3. Stable appearance of hypodensity involving the left caudate head with extension into the left coronal radiata. Again, this finding is favored to be chronic in nature.     11/12/2013 hypoattenuation in left caudate head and basal ganglia, extending into L corona radiata, favored to be remote  Cerebral Angiogram 11/12/2013 RT CFA approach. No gross occlusions,stenosis , intralumoinal filling defects or dissections seen. Lt VA  fenestration at C1. Scattered focal areas of caliber irregularity = intracranial arteriosclerosis  MRI of the brain  11/13/2013   1. Acute infarct involving the right corona radiata and posterior lentiform nucleus. No evidence of acute hemorrhage. 2. Remote left basal ganglia infarct and moderate chronic microvascular ischemic disease.   MRA of the brain  11/13/2013     No evidence of intracranial arterial high-grade stenosis, occlusion, or aneurysm.     2D Echocardiogram  EF 55-60%, grade 1 diastolic dysfunction  Carotid Doppler  No evidence of hemodynamically significant internal carotid artery stenosis. Vertebral artery flow is antegrade.   EKG  Sinus rhythm, no acute ST abnormalities  Chest Xray 11/12/2013   1. Borderline cardiomegaly. 2. Low lung volumes.  Therapy Recommendations CIR  Physical Exam   Filed Vitals:   11/14/13 0942  BP: 161/94  Pulse: 72  Temp: 98 F (36.7 C)  Resp: 18   Mental Status:  A&O x3, speech fluent though with mild dysarthria.  Pt follows commands without difficulty. Cranial Nerves:  II - visual fields intact  III/IV/VI - PERRL, EOMI  V/VII - left lower facial droop  VIII - hearing intact X - palate elevates bilaterally, speech mildly slurred XII - L tongue deviation Motor:  L arm: 4/5 L leg: 4/5 R arm: 5/5 R leg: 5/5 Normal muscle bulk and tone Sensory: intact Deep Tendon Reflexes: 1+ bilaterally Cerebellar: Normal finger-to-nose testing (impaired on left due to strength)  Carotid auscultation: No bruit NIHSS 3  ASSESSMENT [name] is a 57 y.o. male presenting with L face and extremity weakness and dysarthria. Status post IV t-PA at 2:38 AM 12/3. 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 Infarct felt to be thrombotic secondary to small vessel disease.  On no anticoagulation prior to admission. Now on Aspirin 325 mg po daily for secondary stroke prevention. Patient with resultant  mild improvement in left hemiparesis, dysarthria. Work up completed.   IR angiogram showed no large vessel disease, though scattered intracranial atherosclerosis  Repeat CT after TPA showed no hemorrhage  S/p TPA 12/3  A1C = 6.8, showing new diagnosis of DM  LDL = 140, on atorvastatin 80  Hypokalemia, K 3.3  Hospital day # 2  TREATMENT/PLAN  Continue aspirin 325 mg daily for secondary stroke prevention  Rehab consult in place. Anticipate discharge there Monday. Should pt improve over the weekend consider discharge home with OP or HH therapies. Dr. Pearlean Brownie has discussed with the patient.  Give 20 meq K today  and recheck in am  Annie Main, MSN, RN, ANVP-BC, ANP-BC, [facility] Pager: [phone] 11/14/2013 10:47 AM  I have personally obtained a history, examined the patient, evaluated imaging results, and formulated the assessment and plan of care. I agree with the above. Delia Heady, MD

## 2013-11-14 NOTE — Progress Notes (Signed)
I met with pt at bedside. Discussed inpt rehab admission for his rehab recovery. His 59 yo son lives with him and works. He will discuss with family, assistance available once he is discharged. I am clarifying his insurance which is [employer] and will seek insurance approval to admit early next week. [phone]

## 2013-11-14 NOTE — Progress Notes (Signed)
Speech Language Pathology Treatment: Cognitive-Linquistic  Patient Details Name: Benjamin Cohen MRN: [id number] DOB: March 16, 1956 Today's Date: 11/14/2013 Time: 1040-1056 SLP Time Calculation (min): 16 min  Assessment / Plan / Recommendation Clinical Impression  Further diagnostic treatment and interventions provided with hiher level cognitive tasks. Pt demonstrates decreased awareness, reasoning and sequencing with complex visual tasks. Pt also seemed to have difficulty visually scanning newspaper, calendar. SLP provided mod verbal and min tactile cues for completion. Pt will need f/u therapy prior to d/c home, continue to recommend CIR.    HPI HPI: 57 y.o. 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 Given tPA. Pt with left CVA on CT, but also with left sided weakness. MRI is pending   Pertinent Vitals NA  SLP Plan  Continue with current plan of care    Recommendations                General recommendations: Rehab consult Follow up Recommendations: Inpatient Rehab Plan: Continue with current plan of care    GO    [facility], [place] CCC-SLP [phone]  [name] 11/14/2013, 1:26 PM

## 2013-11-14 NOTE — Progress Notes (Signed)
Physical Therapy Treatment Patient Details Name: Benjamin Cohen MRN: [id number] DOB: 10-07-1956 Today's Date: 11/14/2013 Time: 1145-1200 PT Time Calculation (min): 15 min  PT Assessment / Plan / Recommendation  History of Present Illness 57 yo M, history of HTN with medication non-compliance, presented 12/3 with left face, left arm, and left leg weakness, as well as dysarthria.  Patient last normal at 1:00 AM, given TPA at 2:38 AM on 12/3.  Symptoms only mildly improved.  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  BP Elevated to 202/120 on admission, and patient placed on nicardipine drip.  He was admitted to the neuro ICU for further evaluation and treatment. He had emergent cerebral catheter angiogram by Dr. Corliss Skains which did not show any large vessel occlusion.   PT Comments   Pt demos improvements in mobility today, though continues to require MinA and RW.  Pt motivated to continue working on mobility.    Follow Up Recommendations  CIR     Does the patient have the potential to tolerate intense rehabilitation     Barriers to Discharge        Equipment Recommendations  Rolling walker with 5" wheels    Recommendations for Other Services Rehab consult  Frequency Min 4X/week   Progress towards PT Goals Progress towards PT goals: Progressing toward goals  Plan Current plan remains appropriate    Precautions / Restrictions Precautions Precautions: Fall Restrictions Weight Bearing Restrictions: No   Pertinent Vitals/Pain Denied pain.      Mobility  Bed Mobility Bed Mobility: Supine to Sit;Sitting - Scoot to Edge of Bed Supine to Sit: 5: Supervision;With rails Sitting - Scoot to Edge of Bed: 5: Supervision Details for Bed Mobility Assistance: pt demos improvement in bed mobility.   Transfers Transfers: Sit to Stand;Stand to Sit Sit to Stand: 4: Min guard;With upper extremity assist;From  bed Stand to Sit: 4: Min guard;With upper extremity assist;To chair/3-in-1 Details for Transfer Assistance: cues for UE use and controlling descent to chair.   Ambulation/Gait Ambulation/Gait Assistance: 4: Min assist Ambulation Distance (Feet): 80 Feet Assistive device: Rolling walker Ambulation/Gait Assistance Details: pt mildly unsteady and with L LE fatigueing quicker than R.  pt indicates only mild dizziness and states "Its not as bad as it was." Gait Pattern: Step-through pattern;Decreased stride length;Decreased stance time - left;Shuffle;Lateral trunk lean to left Stairs: No Wheelchair Mobility Wheelchair Mobility: No Modified Rankin (Stroke Patients Only) Pre-Morbid Rankin Score: No symptoms Modified Rankin: Moderately severe disability    Exercises     PT Diagnosis:    PT Problem List:   PT Treatment Interventions:     PT Goals (current goals can now be found in the care plan section) Acute Rehab PT Goals Patient Stated Goal: To get back to work Time For Goal Achievement: 11/20/13 Potential to Achieve Goals: Good  Visit Information  Last PT Received On: 11/14/13 Assistance Needed: +1 History of Present Illness: 57 yo M, history of HTN with medication non-compliance, presented 12/3 with left face, left arm, and left leg weakness, as well as dysarthria.  Patient last normal at 1:00 AM, given TPA at 2:38 AM on 12/3.  Symptoms only mildly improved.  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  BP Elevated to 202/120 on admission, and patient placed on nicardipine drip.  He was admitted to the neuro  ICU for further evaluation and treatment. He had emergent cerebral catheter angiogram by Dr. Corliss Skains which did not show any large vessel occlusion.    Subjective Data  Patient Stated Goal: To get back to work   Cognition  Cognition Arousal/Alertness: Awake/alert Behavior During Therapy:  WFL for tasks assessed/performed Overall Cognitive Status: Impaired/Different from baseline Area of Impairment: Attention;Safety/judgement;Problem solving Current Attention Level: Selective Safety/Judgement: Decreased awareness of safety Problem Solving: Slow processing;Difficulty sequencing    Balance  Balance Balance Assessed: No  End of Session PT - End of Session Equipment Utilized During Treatment: Gait belt Activity Tolerance: Patient tolerated treatment well Patient left: in chair;with call bell/phone within reach Nurse Communication: Mobility status   GP     Sunny Schlein, [place] [phone] 11/14/2013, 2:50 PM

## 2013-11-15 LAB — GLUCOSE, CAPILLARY: Glucose-Capillary: 138 mg/dL — ABNORMAL HIGH (ref 70–99)

## 2013-11-15 LAB — BASIC METABOLIC PANEL
BUN: 14 mg/dL (ref 6–23)
Calcium: 8.8 mg/dL (ref 8.4–10.5)
GFR calc non Af Amer: 90 mL/min — ABNORMAL LOW (ref >90)
Glucose, Bld: 112 mg/dL — ABNORMAL HIGH (ref 70–99)
Sodium: 141 mEq/L (ref 135–145)

## 2013-11-15 NOTE — Progress Notes (Signed)
Stroke Team Progress Note  HISTORY 57 yo M, history of HTN with medication non-compliance, presented 12/3 with left face, left arm, and left leg weakness, as well as dysarthria.  Patient last normal at 1:00 AM, given TPA at 2:38 AM on 12/3.  Symptoms only mildly improved.  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  BP Elevated to 202/120 on admission, and patient placed on nicardipine drip.  He was admitted to the neuro ICU for further evaluation and treatment. He had emergent cerebral catheter angiogram by Dr. Corliss Skains which did not show any large vessel occlusion.  SUBJECTIVE NT in room. Pt in bathroom, standing, doing personal care.  OBJECTIVE Most recent Vital Signs: Filed Vitals:   11/14/13 2115 11/15/13 0105 11/15/13 0540 11/15/13 1011  BP: 166/103 157/95 164/95 143/77  Pulse: 68 69 59 72  Temp: 98 F (36.7 C) 97.9 F (36.6 C) 97.8 F (36.6 C) 98.1 F (36.7 C)  TempSrc: Oral Oral Oral Oral  Resp: 18 20 18 18   Height:      Weight:      SpO2: 97% 97% 99% 98%   CBG (last 3)   Recent Labs  11/14/13 0737 11/15/13 0638  GLUCAP 116* 103*    IV Fluid Intake:      MEDICATIONS  . aspirin EC  325 mg Oral Daily  . atorvastatin  80 mg Oral q1800   PRN:  acetaminophen, acetaminophen, labetalol, LORazepam  Diet:  Cardiac thin liquids Activity:  Up with assistance DVT Prophylaxis:  SCD's  CLINICALLY SIGNIFICANT STUDIES Basic Metabolic Panel:   Recent Labs Lab 11/12/13 0205 11/12/13 1155 11/13/13 0940 11/15/13 0430  NA 143 143 141 141  K 2.1* 3.5 3.3* 3.6  CL 102 104 104 104  CO2  --  25 26 26   GLUCOSE 178* 123* 141* 112*  BUN 12 9 12 14   CREATININE 1.40* 0.84 0.88 0.94  CALCIUM  --  8.6 8.6 8.8  MG  --  1.8  --   --    Liver Function Tests:   Recent Labs Lab 11/12/13 0159  AST 22  ALT 22  ALKPHOS 91  BILITOT 0.4  PROT 7.4  ALBUMIN 4.2   CBC:   Recent  Labs Lab 11/12/13 0159 11/12/13 0205  WBC 7.5  --   NEUTROABS 1.2*  --   HGB 15.1 16.7  HCT 45.0 49.0  MCV 82.9  --   PLT 244  --    Coagulation:   Recent Labs Lab 11/12/13 0159  LABPROT 11.9  INR 0.89   Cardiac Enzymes:   Recent Labs Lab 11/12/13 0200  TROPONINI <0.30   Urinalysis:   Recent Labs Lab 11/12/13 0407  COLORURINE YELLOW  LABSPEC 1.013  PHURINE 5.5  GLUCOSEU NEGATIVE  HGBUR SMALL*  BILIRUBINUR NEGATIVE  KETONESUR NEGATIVE  PROTEINUR 100*  UROBILINOGEN 0.2  NITRITE NEGATIVE  LEUKOCYTESUR NEGATIVE   Lipid Panel     Component Value Date/Time   CHOL 216* 11/12/2013 0700   TRIG 157* 11/12/2013 0700   HDL 45 11/12/2013 0700   CHOLHDL 4.8 11/12/2013 0700   VLDL 31 11/12/2013 0700   LDLCALC 140* 11/12/2013 0700   HgbA1C  Lab Results  Component Value Date   HGBA1C 6.8* 11/12/2013    Urine Drug Screen:     Component Value Date/Time   LABOPIA NONE DETECTED 11/12/2013 0408   COCAINSCRNUR NONE DETECTED 11/12/2013 0408   LABBENZ NONE  DETECTED 11/12/2013 0408   AMPHETMU NONE DETECTED 11/12/2013 0408   THCU NONE DETECTED 11/12/2013 0408   LABBARB NONE DETECTED 11/12/2013 0408    Alcohol Level:   Recent Labs Lab 11/12/13 0159  ETH <11     CT of the brain   11/12/2013   1. No evidence of intracranial hemorrhage status post tPA administration. 2. New 1.2 x 0.9 cm hypodensity involving the right putamen with extension into the right corona radiata, consistent with acute ischemic infarct. 3. Stable appearance of hypodensity involving the left caudate head with extension into the left coronal radiata. Again, this finding is favored to be chronic in nature.     11/12/2013 hypoattenuation in left caudate head and basal ganglia, extending into L corona radiata, favored to be remote  Cerebral Angiogram 11/12/2013 RT CFA approach. No gross occlusions,stenosis , intralumoinal filling defects or dissections seen. 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 Scattered focal areas of  caliber irregularity = intracranial arteriosclerosis  MRI of the brain  11/13/2013   1. Acute infarct involving the right corona radiata and posterior lentiform nucleus. No evidence of acute hemorrhage. 2. Remote left basal ganglia infarct and moderate chronic microvascular ischemic disease.   MRA of the brain  11/13/2013     No evidence of intracranial arterial high-grade stenosis, occlusion, or aneurysm.     2D Echocardiogram  EF 55-60%, grade 1 diastolic dysfunction  Carotid Doppler  No evidence of hemodynamically significant internal carotid artery stenosis. Vertebral artery flow is antegrade.   EKG  Sinus rhythm, no acute ST abnormalities  Chest Xray 11/12/2013   1. Borderline cardiomegaly. 2. Low lung volumes.  Therapy Recommendations CIR  Physical Exam   Filed Vitals:   11/15/13 1011  BP: 143/77  Pulse: 72  Temp: 98.1 F (36.7 C)  Resp: 18   Mental Status:  A&O x3, speech fluent though with mild dysarthria.  Pt follows commands without difficulty. Cranial Nerves:  II - visual fields intact  III/IV/VI - PERRL, EOMI  V/VII - left lower facial droop  VIII - hearing intact X - palate elevates bilaterally, speech mildly slurred XII - L tongue deviation Motor:  L arm: 4/5 L leg: 4/5 R arm: 5/5 R leg: 5/5 Normal muscle bulk and tone Sensory: intact Deep Tendon Reflexes: 1+ bilaterally Cerebellar: Normal finger-to-nose testing (impaired on left due to strength)  Carotid auscultation: No bruit NIHSS 3  ASSESSMENT [name] is a 58 y.o. male presenting with L face and extremity weakness and dysarthria. Status post IV t-PA at 2:38 AM 12/3. 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 Infarct felt to be thrombotic secondary to small vessel disease.  On no anticoagulation prior to admission. Now on Aspirin 325 mg po daily for secondary stroke prevention. Patient with resultant mild improvement in left hemiparesis,  dysarthria. Work up completed.   IR angiogram showed no large vessel disease, though scattered intracranial atherosclerosis  Repeat CT after TPA showed no hemorrhage  S/p TPA 12/3  A1C = 6.8, showing new diagnosis of DM  LDL = 140, on atorvastatin 80  Hypokalemia, K 3.3  Hospital day # 3  TREATMENT/PLAN  Continue aspirin 325 mg daily for secondary stroke prevention  Rehab has seen pt, awaiting for insurance approval early next week.   Currently no complaints at this time.    11/15/2013 10:29 AM  Pauletta Browns

## 2013-11-16 LAB — GLUCOSE, CAPILLARY
Glucose-Capillary: 106 mg/dL — ABNORMAL HIGH (ref 70–99)
Glucose-Capillary: 100 mg/dL — ABNORMAL HIGH (ref 70–99)

## 2013-11-16 NOTE — Progress Notes (Signed)
Stroke Team Progress Note  HISTORY 57 yo M, history of HTN with medication non-compliance, presented 12/3 with left face, left arm, and left leg weakness, as well as dysarthria.  Patient last normal at 1:00 AM, given TPA at 2:38 AM on 12/3.  Symptoms only mildly improved.  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  BP Elevated to 202/120 on admission, and patient placed on nicardipine drip.  He was admitted to the neuro ICU for further evaluation and treatment. He had emergent cerebral catheter angiogram by Dr. Corliss Cohen which did not show any large vessel occlusion.  SUBJECTIVE There are no family members present this morning. The patient feels he is improving. He has no complaints.  OBJECTIVE Most recent Vital Signs: Filed Vitals:   11/15/13 2204 11/16/13 0157 11/16/13 0519 11/16/13 0819  BP: 158/90 165/89 158/89 164/107  Pulse: 68 71 67 71  Temp: 97.8 F (36.6 C) 97.4 F (36.3 C) 97.4 F (36.3 C) 97.8 F (36.6 C)  TempSrc: Oral Oral Oral Oral  Resp: 18 18 18 18   Height:      Weight:      SpO2: 98% 98% 96% 100%   CBG (last 3)   Recent Labs  11/15/13 1631 11/15/13 2114 11/16/13 0635  GLUCAP 80 124* 112*    IV Fluid Intake:      MEDICATIONS  . aspirin EC  325 mg Oral Daily  . atorvastatin  80 mg Oral q1800   PRN:  acetaminophen, acetaminophen, labetalol, LORazepam  Diet:  Cardiac thin liquids Activity:  Up with assistance DVT Prophylaxis:  SCD's  CLINICALLY SIGNIFICANT STUDIES Basic Metabolic Panel:   Recent Labs Lab 11/12/13 0205 11/12/13 1155 11/13/13 0940 11/15/13 0430  NA 143 143 141 141  K 2.1* 3.5 3.3* 3.6  CL 102 104 104 104  CO2  --  25 26 26   GLUCOSE 178* 123* 141* 112*  BUN 12 9 12 14   CREATININE 1.40* 0.84 0.88 0.94  CALCIUM  --  8.6 8.6 8.8  MG  --  1.8  --   --    Liver Function Tests:   Recent Labs Lab 11/12/13 0159  AST 22  ALT 22  ALKPHOS  91  BILITOT 0.4  PROT 7.4  ALBUMIN 4.2   CBC:   Recent Labs Lab 11/12/13 0159 11/12/13 0205  WBC 7.5  --   NEUTROABS 1.2*  --   HGB 15.1 16.7  HCT 45.0 49.0  MCV 82.9  --   PLT 244  --    Coagulation:   Recent Labs Lab 11/12/13 0159  LABPROT 11.9  INR 0.89   Cardiac Enzymes:   Recent Labs Lab 11/12/13 0200  TROPONINI <0.30   Urinalysis:   Recent Labs Lab 11/12/13 0407  COLORURINE YELLOW  LABSPEC 1.013  PHURINE 5.5  GLUCOSEU NEGATIVE  HGBUR SMALL*  BILIRUBINUR NEGATIVE  KETONESUR NEGATIVE  PROTEINUR 100*  UROBILINOGEN 0.2  NITRITE NEGATIVE  LEUKOCYTESUR NEGATIVE   Lipid Panel     Component Value Date/Time   CHOL 216* 11/12/2013 0700   TRIG 157* 11/12/2013 0700   HDL 45 11/12/2013 0700   CHOLHDL 4.8 11/12/2013 0700   VLDL 31 11/12/2013 0700   LDLCALC 140* 11/12/2013 0700   HgbA1C  Lab Results  Component Value Date   HGBA1C 6.8* 11/12/2013    Urine Drug Screen:     Component Value Date/Time   LABOPIA NONE DETECTED 11/12/2013 0408  COCAINSCRNUR NONE DETECTED 11/12/2013 0408   LABBENZ NONE DETECTED 11/12/2013 0408   AMPHETMU NONE DETECTED 11/12/2013 0408   THCU NONE DETECTED 11/12/2013 0408   LABBARB NONE DETECTED 11/12/2013 0408    Alcohol Level:   Recent Labs Lab 11/12/13 0159  ETH <11     CT of the brain   11/12/2013   1. No evidence of intracranial hemorrhage status post tPA administration. 2. New 1.2 x 0.9 cm hypodensity involving the right putamen with extension into the right corona radiata, consistent with acute ischemic infarct. 3. Stable appearance of hypodensity involving the left caudate head with extension into the left coronal radiata. Again, this finding is favored to be chronic in nature.     11/12/2013 hypoattenuation in left caudate head and basal ganglia, extending into L corona radiata, favored to be remote  Cerebral Angiogram 11/12/2013 RT CFA approach. No gross occlusions,stenosis , intralumoinal filling defects or dissections  seen. 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 Scattered focal areas of caliber irregularity = intracranial arteriosclerosis  MRI of the brain  11/13/2013   1. Acute infarct involving the right corona radiata and posterior lentiform nucleus. No evidence of acute hemorrhage. 2. Remote left basal ganglia infarct and moderate chronic microvascular ischemic disease.   MRA of the brain  11/13/2013     No evidence of intracranial arterial high-grade stenosis, occlusion, or aneurysm.     2D Echocardiogram  EF 55-60%, grade 1 diastolic dysfunction  Carotid Doppler  No evidence of hemodynamically significant internal carotid artery stenosis. Vertebral artery flow is antegrade.   EKG  Sinus rhythm, no acute ST abnormalities  Chest Xray 11/12/2013   1. Borderline cardiomegaly. 2. Low lung volumes.  Therapy Recommendations CIR  Physical Exam   Filed Vitals:   11/16/13 0819  BP: 164/107  Pulse: 71  Temp: 97.8 F (36.6 C)  Resp: 18   Mental Status:  A&O x3, speech fluent though with mild dysarthria.  Pt follows commands without difficulty. Cranial Nerves:  II - visual fields intact  III/IV/VI - PERRL, EOMI  V/VII - left lower facial droop  VIII - hearing intact X - palate elevates bilaterally, speech mildly slurred XII - L tongue deviation Motor:  L arm: 4+ /5 L leg: 4+/5 R arm: 5/5 R leg: 5/5 Normal muscle bulk and tone Sensory: intact Deep Tendon Reflexes: 1+ bilaterally Cerebellar: Normal finger-to-nose testing (impaired on left due to strength)  Carotid auscultation: No bruit NIHSS 3  ASSESSMENT [name] is a 57 y.o. male presenting with L face and extremity weakness and dysarthria. Status post IV t-PA at 2:38 AM 12/3. 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 Infarct felt to be thrombotic secondary to small vessel disease.  On no anticoagulation prior to admission. Now on Aspirin 325 mg po daily for secondary stroke prevention.  Patient with resultant mild improvement in left hemiparesis, dysarthria. Work up completed.   IR angiogram showed no large vessel disease, though scattered intracranial atherosclerosis  Repeat CT after TPA showed no hemorrhage  S/p TPA 12/3  A1C = 6.8, showing new diagnosis of DM  LDL = 140, on atorvastatin 80  Hypokalemia - treated K 3.6 today.  Hospital day # 4  TREATMENT/PLAN  Continue aspirin 325 mg daily for secondary stroke prevention  Rehab has seen pt, awaiting for insurance approval early next week.    Delton See PA-C [facility] Pager [phone] 11/16/2013, 9:25 AM  Work up complete awaiting  rehab placement. FML paperwork filled out.

## 2013-11-16 NOTE — Progress Notes (Signed)
Dr Thad Ranger notified for pts CBG 355, no new orders received. Will recheck CBG at 0200.

## 2013-11-17 LAB — GLUCOSE, CAPILLARY
Glucose-Capillary: 109 mg/dL — ABNORMAL HIGH (ref 70–99)
Glucose-Capillary: 107 mg/dL — ABNORMAL HIGH (ref 70–99)
Glucose-Capillary: 94 mg/dL (ref 70–99)

## 2013-11-17 MED ORDER — ATORVASTATIN CALCIUM 10 MG PO TABS: 10.0000 mg | ORAL_TABLET | Freq: Every day | ORAL | Status: DC

## 2013-11-17 MED ORDER — ATORVASTATIN CALCIUM 10 MG PO TABS
10.0000 mg | ORAL_TABLET | Freq: Every day | ORAL | Status: DC
  Administered 2013-11-17: 10 mg via ORAL
  Filled 2013-11-17: qty 1

## 2013-11-17 MED ORDER — LIVING WELL WITH DIABETES BOOK
Freq: Once | Status: AC
  Administered 2013-11-17: 10:00:00
  Filled 2013-11-17: qty 1

## 2013-11-17 MED ORDER — HYDROCHLOROTHIAZIDE 25 MG PO TABS
25.0000 mg | ORAL_TABLET | Freq: Every day | ORAL | Status: DC
  Administered 2013-11-17: 25 mg via ORAL
  Filled 2013-11-17: qty 1

## 2013-11-17 MED ORDER — ASPIRIN 325 MG PO TBEC: 325.0000 mg | DELAYED_RELEASE_TABLET | Freq: Every day | ORAL | Status: DC

## 2013-11-17 MED ORDER — HYDROCHLOROTHIAZIDE 25 MG PO TABS: 25.0000 mg | ORAL_TABLET | Freq: Every day | ORAL | Status: DC

## 2013-11-17 NOTE — Progress Notes (Addendum)
Inpatient Diabetes Program Recommendations  AACE/ADA: New Consensus Statement on Inpatient Glycemic Control (2013)  Target Ranges:  Prepandial:   less than 140 mg/dL      Peak postprandial:   less than 180 mg/dL (1-2 hours)      Critically ill patients:  140 - 180 mg/dL    **Patient admitted with CVA.  Diagnosed with DM this admission (A1c 6.8%- 11/12/13).  **Spoke with pt about new diagnosis.  Discussed A1C results with him and explained what an A1C is, basic pathophysiology of DM Type 2, basic home care, importance of checking CBGs and maintaining good CBG control to prevent long-term and short-term complications.  Reviewed signs and symptoms of hyperglycemia and hypoglycemia.  RNs to provide ongoing basic DM education at bedside with this patient.  Have ordered educational booklet and DM videos.  **Also reviewed basic DM diet information with patient (including basic carb counting, serving sizes, foods to avoid/limit, beverage choices, etc).  **MD- Patient has not needed any insulin here in hospital.  May be able to control his CBGs at home with diet alone.  **MD- Patient needs PCP to follow up with after d/c.  Have placed care management consult for this.    Will follow. Ambrose Finland RN, MSN, CDE Diabetes Coordinator Inpatient Diabetes Program Team Pager: [phone] (8a-10p)

## 2013-11-17 NOTE — Progress Notes (Signed)
Occupational Therapy Treatment Patient Details Name: Benjamin Cohen MRN: [id number] DOB: [birth year] Today's Date: 11/17/2013 Time: 4098-1191 OT Time Calculation (min): 11 min  OT Assessment / Plan / Recommendation  History of present illness 57 yo M, history of HTN with medication non-compliance, presented 12/3 with left face, left arm, and left leg weakness, as well as dysarthria.  Patient last normal at 1:00 AM, given TPA at 2:38 AM on 12/3.  Symptoms only mildly improved.  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  BP Elevated to 202/120 on admission, and patient placed on nicardipine drip.  He was admitted to the neuro ICU for further evaluation and treatment. He had emergent cerebral catheter angiogram by Dr. Corliss Skains which did not show any large vessel occlusion.   OT comments  Pt is at adequate level for d/c home from OT standpoint. Pt could benefit from nutritional consult for DM management and diet changes. Pt with questions for MD regarding BP management. Pt walking greater than a mile this AM around unit.   Follow Up Recommendations  No OT follow up    Barriers to Discharge       Equipment Recommendations  None recommended by OT    Recommendations for Other Services    Frequency     Progress towards OT Goals Progress towards OT goals: Goals met and updated - see care plan  Plan Discharge plan needs to be updated    Precautions / Restrictions Precautions Precautions: None Restrictions Weight Bearing Restrictions: No   Pertinent Vitals/Pain none    ADL  Tub/Shower Transfer: Modified independent Tub/Shower Transfer Method: Ambulating Tub/Shower Transfer Equipment: Walk in shower Transfers/Ambulation Related to ADLs: pt ambulating unit without balance deficits at this time time. Pt able to turn head in all directions and scan environment ADL Comments: Pt ambulating 6 laps around  unit this AM and completing tub transfer. pt reports no deficits remain with hands. Pt able to read size 12 font without deficits. Pt is at or near baseline.Pt asking for assistance in diet changes, management of CBGs and management of BP. RN Marylene Land contacted to request order for patient.     OT Diagnosis:    OT Problem List:   OT Treatment Interventions:     OT Goals(current goals can now be found in the care plan section) Acute Rehab OT Goals Patient Stated Goal: To get back to work OT Goal Formulation: With patient Time For Goal Achievement: 11/27/13 Potential to Achieve Goals: Good  Visit Information  Last OT Received On: 11/17/13 Assistance Needed: +1 History of Present Illness: 57 yo M, history of HTN with medication non-compliance, presented 12/3 with left face, left arm, and left leg weakness, as well as dysarthria.  Patient last normal at 1:00 AM, given TPA at 2:38 AM on 12/3.  Symptoms only mildly improved.  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  BP Elevated to 202/120 on admission, and patient placed on nicardipine drip.  He was admitted to the neuro ICU for further evaluation and treatment. He had emergent cerebral catheter angiogram by Dr. Corliss Skains which did not show any large vessel occlusion.    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 Communication Communication: No difficulties Dominant Hand: Right    Cognition  Cognition Arousal/Alertness: Awake/alert Behavior During Therapy: WFL for tasks assessed/performed Overall Cognitive  Status: Within Functional Limits for tasks assessed Area of Impairment: Attention;Problem solving Current Attention Level: Alternating Problem Solving: Slow processing    Mobility  Bed Mobility Bed Mobility: Not assessed Transfers Transfers: Not assessed (completed tub transfer mod I)    Exercises      Balance Balance Balance Assessed: Yes High Level Balance High Level Balance Activites: Other (comment) (standing eyes closed in tub 10 seconds) High Level Balance Comments:  pt is able to occlude vision and maintain static standing   End of Session OT - End of Session Activity Tolerance: Patient tolerated treatment well Nurse Communication: Mobility status  GO     Boone Master B 11/17/2013, 8:21 AM Pager: [phone]

## 2013-11-17 NOTE — Discharge Summary (Addendum)
Stroke Discharge Summary  Patient ID: Benjamin Cohen   MRN: [id number]      DOB: 03/22/1956  Date of Admission: 11/12/2013 Date of Discharge: 11/17/2013  Attending Physician:  Darcella Cheshire, MD, Stroke MD  Consulting Physician(s):      Raelyn Ensign, MD (Interventional Neuroradiologist); Faith Rogue, MD (Physical Medicine & Rehabtilitation)  Patient's PCP:  No primary provider on file.  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  History reviewed. No pertinent past medical history. History reviewed. No pertinent past surgical history.    Medication List    Notice   You have not been prescribed any medications.      LABORATORY STUDIES CBC    Component Value Date/Time   WBC 7.5 11/12/2013 0159   RBC 5.43 11/12/2013 0159   HGB 16.7 11/12/2013 0205   HCT 49.0 11/12/2013 0205   PLT 244 11/12/2013 0159   MCV 82.9 11/12/2013 0159   MCH 27.8 11/12/2013 0159   MCHC 33.6 11/12/2013 0159   RDW 13.8 11/12/2013 0159   LYMPHSABS 5.5* 11/12/2013 0159   MONOABS 0.6 11/12/2013 0159   EOSABS 0.1 11/12/2013 0159   BASOSABS 0.1 11/12/2013 0159   CMP    Component Value Date/Time   NA 141 11/15/2013 0430   K 3.6 11/15/2013 0430   CL 104 11/15/2013 0430   CO2 26 11/15/2013 0430   GLUCOSE 112* 11/15/2013 0430   BUN 14 11/15/2013 0430   CREATININE 0.94 11/15/2013 0430   CALCIUM 8.8 11/15/2013 0430   PROT 7.4 11/12/2013 0159   ALBUMIN 4.2 11/12/2013 0159   AST 22 11/12/2013 0159   ALT 22 11/12/2013 0159   ALKPHOS 91 11/12/2013 0159   BILITOT 0.4 11/12/2013 0159   GFRNONAA 90* 11/15/2013 0430   GFRAA >90 11/15/2013 0430   COAGS Lab Results  Component Value Date   INR 0.89 11/12/2013   Lipid Panel    Component Value Date/Time    CHOL 216* 11/12/2013 0700   TRIG 157* 11/12/2013 0700   HDL 45 11/12/2013 0700   CHOLHDL 4.8 11/12/2013 0700   VLDL 31 11/12/2013 0700   LDLCALC 140* 11/12/2013 0700   HgbA1C  Lab Results  Component Value Date   HGBA1C 6.8* 11/12/2013   Cardiac Panel (last 3 results) No results found for this basename: CKTOTAL, CKMB, TROPONINI, RELINDX,  in the last 72 hours Urinalysis    Component Value Date/Time   COLORURINE YELLOW 11/12/2013 0407   APPEARANCEUR CLOUDY* 11/12/2013 0407   LABSPEC 1.013 11/12/2013 0407   PHURINE 5.5 11/12/2013 0407   GLUCOSEU NEGATIVE 11/12/2013 0407   HGBUR SMALL* 11/12/2013 0407   BILIRUBINUR NEGATIVE 11/12/2013 0407   KETONESUR NEGATIVE 11/12/2013 0407   PROTEINUR 100* 11/12/2013 0407   UROBILINOGEN 0.2 11/12/2013 0407   NITRITE NEGATIVE 11/12/2013 0407   LEUKOCYTESUR NEGATIVE 11/12/2013 0407   Urine Drug Screen     Component Value Date/Time   LABOPIA NONE DETECTED 11/12/2013 0408   COCAINSCRNUR NONE DETECTED 11/12/2013 0408   LABBENZ NONE DETECTED 11/12/2013 0408   AMPHETMU NONE DETECTED 11/12/2013 0408   THCU NONE DETECTED 11/12/2013 0408   LABBARB NONE DETECTED 11/12/2013 0408    Alcohol Level    Component Value Date/Time   ETH <11  11/12/2013 0159   SIGNIFICANT DIAGNOSTIC STUDIES CT of the brain  11/12/2013 1. No evidence of intracranial hemorrhage status post tPA administration. 2. New 1.2 x 0.9 cm hypodensity involving the right putamen with extension into the right corona radiata, consistent with acute ischemic infarct. 3. Stable appearance of hypodensity involving the left caudate head with extension into the left coronal radiata. Again, this finding is favored to be chronic in nature.  11/12/2013 hypoattenuation in left caudate head and basal ganglia, extending into L corona radiata, favored to be remote  Cerebral Angiogram 11/12/2013 RT CFA approach. No gross occlusions,stenosis , intralumoinal filling defects or dissections seen. 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 Scattered  focal areas of caliber irregularity = intracranial arteriosclerosis  MRI of the brain 11/13/2013 1. Acute infarct involving the right corona radiata and posterior lentiform nucleus. No evidence of acute hemorrhage. 2. Remote left basal ganglia infarct and moderate chronic microvascular ischemic disease.  MRA of the brain 11/13/2013 No evidence of intracranial arterial high-grade stenosis, occlusion, or aneurysm.  2D Echocardiogram EF 55-60%, grade 1 diastolic dysfunction  Carotid Doppler No evidence of hemodynamically significant internal carotid artery stenosis. Vertebral artery flow is antegrade.  EKG Sinus rhythm, no acute ST abnormalities  Chest Xray 11/12/2013 1. Borderline cardiomegaly. 2. Low lung volumes.  Therapy Recommendations CIR     History of Present Illness   57 yo M, history of HTN with medication non-compliance, presented 12/3 with left face, left arm, and left leg weakness, as well as dysarthria. Patient last normal at 1:00 AM, given TPA at 2:38 AM on 12/3. Symptoms only mildly improved. 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 BP Elevated to 202/120 on admission, and patient placed on nicardipine drip. He was admitted to the neuro ICU for further evaluation and treatment. He had emergent cerebral catheter angiogram by Dr. Corliss Skains which did not show any large vessel occlusion.   Hospital Course  Patient tolerated tPA without complication. Imaging at 24 hours shows no hemorrhage. MRI confirmed ischemic infarct in the left caudate nucleus and basal ganglia infarct extending to L corona radiata. Stroke was found to be thrombotic secondary to small vessel disease.  Patient with vascular risk factors of:   malignant hypertension, started on IV cardene in the ED. Quickly weaned off. Started on HCTZ 25 mg daily for management. Hyperlipidemia, LDL 140, on no statin PTA, started on lipitor 80 in  hospital, will be discharged on lipitor 10, goal LDL < 70 for diabetics Diabetes, new diagnosis, HgbA1c 6.8, at goal < 7.0. He was seen by the diabetes nurse in the hospital with initial education started. He has been referred to outpatient diabetes management Center. Also, referral made to primary care provider at discharge  Patient also with:  Hypokalemia - treated and resolved.   Patient with continued stroke symptoms of mild improvement in left hemiparesis, dysarthria. Physical therapy, occupational therapy and speech therapy evaluated patient. They initially recommended inpatient rehabilitation. While waiting for insurance approval over the weekend, patient continued to progress. By Monday morning he was too high level for inpatient rehabilitation and decision was made to discharge him home; outpatient therapy not indicated (supervision only).  FMLA papers were given back to the patient, completed but not signed by M.D. At patient request, he wants to take them by the doctor's office to get them signed (I gave option of having them signed and mailed to him).  Discharge Exam  Blood pressure 137/87, pulse 75, temperature 97.8 F (36.6 C), temperature source Oral, resp. rate 18, height 5\' 8"  (1.727 m), weight 96.798 kg (213 lb 6.4 oz), SpO2 96.00%.  Mental Status:  A&O x3, speech fluent though with mild dysarthria. Pt follows commands without difficulty.  Cranial Nerves:  II - visual fields intact  III/IV/VI - PERRL, EOMI  V/VII - left lower facial droop  VIII - hearing intact  X - palate elevates bilaterally, speech mildly slurred  XII - L tongue deviation  Motor:  L arm: initially 0/5, then improved to 4/5  L leg: 4/5  R arm: 5/5  R leg: 5/5  Normal muscle bulk and tone  Sensory: intact  Deep Tendon Reflexes: 1+ bilaterally  Cerebellar: Normal finger-to-nose testing (impaired on left due to strength)  Carotid auscultation: No bruit  Discharge Diet   Cardiac thin  liquids  Discharge Plan    Disposition:  Home  OP diabetes management center referral   aspirin 325 mg orally every day for secondary stroke prevention.  Ongoing risk factor control by Primary Care Physician. Risk factor recommendations:   Hypertension target range 130-140/70-80  Lipid range - LDL < 100 and checked every 6 months, fasting  Diabetes - HgB A1C <7   Follow-up PCP in 1 month, patient was instructed to find one as he does not have one. Care management also consulted to help pt .  Follow-up with Dr. Delia Heady, Stroke Clinic in 2 months.  40 minutes were spent preparing discharge.  Signed Annie Main, AVNP, ANP-BC, [facility] Nurse Practitioner 11/17/2013, 2:05 PM  I have personally examined this patient, reviewed pertinent data and developed the plan of care. I agree with above. Delia Heady, MD

## 2013-11-17 NOTE — Plan of Care (Signed)
Problem: Food- and Nutrition-Related Knowledge Deficit (NB-1.1) Goal: Nutrition education Formal process to instruct or train a patient/client in a skill or to impart knowledge to help patients/clients voluntarily manage or modify food choices and eating behavior to maintain or improve health. Outcome: Completed/Met Date Met:  11/17/13  RD consulted for nutrition education regarding a Heart Healthy diet.   Lipid Panel     Component Value Date/Time    CHOL 216* 11/12/2013 0700    TRIG 157* 11/12/2013 0700    HDL 45 11/12/2013 0700    CHOLHDL 4.8 11/12/2013 0700    VLDL 31 11/12/2013 0700    LDLCALC 140* 11/12/2013 0700    RD provided "Heart Healthy Nutrition Therapy" handout from the Academy of [employer]. Reviewed patient's dietary recall. Provided examples on ways to decrease sodium and fat intake in diet. Discouraged intake of processed foods and use of salt shaker. Encouraged fresh fruits and vegetables as well as whole grain sources of carbohydrates to maximize fiber intake. Teach back method used.  Expect fair compliance.  Body mass index is 32.46 kg/(m^2). Pt meets criteria for Obesity Class I based on current BMI.  Current diet order is Heart Healthy, patient is consuming approximately 100% of meals at this time. Labs and medications reviewed.   No further nutrition interventions warranted at this time. If additional nutrition issues arise, please re-consult RD.  Maureen Chatters, RD, LDN Pager #: [phone] After-Hours Pager #: [phone]

## 2013-11-17 NOTE — Progress Notes (Signed)
UR complete.  Schuyler Olden RN, MSN 

## 2013-11-17 NOTE — Progress Notes (Signed)
Noted pt's progress with therapy and no longer needs inpt rehab admission. I have notified Annie Main, [facility] and RN CM. [phone]

## 2013-11-17 NOTE — Progress Notes (Signed)
Physical Therapy Treatment Patient Details Name: Benjamin Cohen MRN: [id number] DOB: 1956/11/27 Today's Date: 11/17/2013 Time: 1191-4782 PT Time Calculation (min): 10 min  PT Assessment / Plan / Recommendation  History of Present Illness 57 yo M, history of HTN with medication non-compliance, presented 12/3 with left face, left arm, and left leg weakness, as well as dysarthria.  Patient last normal at 1:00 AM, given TPA at 2:38 AM on 12/3.  Symptoms only mildly improved.  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  BP Elevated to 202/120 on admission, and patient placed on nicardipine drip.  He was admitted to the neuro ICU for further evaluation and treatment. He had emergent cerebral catheter angiogram by Dr. Corliss Skains which did not show any large vessel occlusion.   PT Comments   Pt much improved with mobility today and demos Mod I with ambulation.  Pt no longer needs CIR at D/C.  Pt is concerned about his BP and CBG.  Would benefit from Dietician Consult for Diabetes Education and Heart Healthy Diet.  Spoke with RN about this.  Anticipate pt will no longer require PT f/u at D/C.    Follow Up Recommendations  Supervision - Intermittent     Does the patient have the potential to tolerate intense rehabilitation     Barriers to Discharge        Equipment Recommendations  None recommended by PT    Recommendations for Other Services    Frequency Min 4X/week   Progress towards PT Goals Progress towards PT goals: Progressing toward goals  Plan Discharge plan needs to be updated    Precautions / Restrictions Precautions Precautions: Fall Restrictions Weight Bearing Restrictions: No   Pertinent Vitals/Pain Denied pain.      Mobility  Bed Mobility Bed Mobility: Not assessed Transfers Transfers: Not assessed Ambulation/Gait Ambulation/Gait Assistance: 6: Modified independent (Device/Increase  time) Ambulation Distance (Feet): 500 Feet Assistive device: None Ambulation/Gait Assistance Details: pt much improved with ambulation and balance.  pt now able to amb without AD.  pt able to change sppeds, performs head turns, and negotiate obstacles.   Gait Pattern: Step-through pattern;Decreased stride length Stairs: No Wheelchair Mobility Wheelchair Mobility: No Modified Rankin (Stroke Patients Only) Pre-Morbid Rankin Score: No symptoms Modified Rankin: Slight disability    Exercises     PT Diagnosis:    PT Problem List:   PT Treatment Interventions:     PT Goals (current goals can now be found in the care plan section) Acute Rehab PT Goals Patient Stated Goal: To get back to work Time For Goal Achievement: 11/20/13 Potential to Achieve Goals: Good  Visit Information  Last PT Received On: 11/17/13 Assistance Needed: +1 History of Present Illness: 57 yo M, history of HTN with medication non-compliance, presented 12/3 with left face, left arm, and left leg weakness, as well as dysarthria.  Patient last normal at 1:00 AM, given TPA at 2:38 AM on 12/3.  Symptoms only mildly improved.  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  BP Elevated to 202/120 on admission, and patient placed on nicardipine drip.  He was admitted to the neuro ICU for further evaluation and treatment. He had emergent cerebral catheter angiogram by Dr. Corliss Skains which did not show any large vessel occlusion.    Subjective Data  Patient Stated Goal: To get back to work  Cognition  Cognition Arousal/Alertness: Awake/alert Behavior During Therapy: WFL for tasks assessed/performed Overall Cognitive Status: Impaired/Different from baseline Area of Impairment: Attention;Problem solving Current Attention Level: Alternating Problem Solving: Slow processing    Balance  Balance Balance Assessed: Yes High Level  Balance High Level Balance Activites: Direction changes;Turns;Head turns  End of Session PT - End of Session Activity Tolerance: Patient tolerated treatment well Patient left:  (pt wanted to continue ambulating in hallway) Nurse Communication: Mobility status (pt concerns about BP and CBG.  )   GP     [name], [name], PT [phone] 11/17/2013, 8:10 AM

## 2013-11-17 NOTE — Care Management Note (Signed)
    Page 1 of 1   11/17/2013     3:16:20 PM   CARE MANAGEMENT NOTE 11/17/2013  Patient:  Benjamin Cohen, Benjamin Cohen   Account Number:  [id number]  Date Initiated:  11/17/2013  Documentation initiated by:  Elmer Bales  Subjective/Objective Assessment:   Patient admitted with CVA.     Action/Plan:   Will follow for discharge needs   Anticipated DC Date:  11/17/2013   Anticipated DC Plan:  HOME/SELF CARE      DC Planning Services  CM consult      Choice offered to / List presented to:             Status of service:  Completed, signed off Medicare Important Message given?   (If response is "NO", the following Medicare IM given date fields will be blank) Date Medicare IM given:   Date Additional Medicare IM given:    Discharge Disposition:  HOME/SELF CARE  Per UR Regulation:  Reviewed for med. necessity/level of care/duration of stay  If discussed at Long Length of Stay Meetings, dates discussed:    Comments:  11/17/13 1500 Elmer Bales RN, MSN, CM- Patient was provided with the [facility] phone number as well as the number for the [facility] to establish a PCP when his insurance has been verified. Patient was encouraged to find a PCP as soon as possible for outpatient follow-up. RN aware.   11/17/13 0945 Elmer Bales RN, MSN, CM- Recieved consult from stroke team asking for clarification of patient's insurance status and assistance with obtaining a PCP. CM contacted financial counselor regarding insurance status. Per Merry Proud in financial counseling, she has been working with patient to verify coverage.  CM requesting that Brandi notify of end result so appropriate discharge plans can be made. Will continue to follow.

## 2013-11-24 ENCOUNTER — Telehealth: Payer: Self-pay | Admitting: *Deleted

## 2013-11-24 ENCOUNTER — Telehealth: Payer: Self-pay | Admitting: Neurology

## 2013-11-24 NOTE — Telephone Encounter (Signed)
Please send a copy of the forms that he dropped off today to his job. [phone] Jeanann Lewandowsky

## 2013-11-24 NOTE — Telephone Encounter (Signed)
Please advise 

## 2013-11-24 NOTE — Telephone Encounter (Signed)
Patient has been scheduled for 2 mos. F/u appt./confirmed

## 2013-11-25 DIAGNOSIS — Z0289 Encounter for other administrative examinations: Secondary | ICD-10-CM

## 2013-11-30 ENCOUNTER — Encounter (HOSPITAL_COMMUNITY): Payer: Self-pay | Admitting: Emergency Medicine

## 2013-11-30 ENCOUNTER — Emergency Department (HOSPITAL_COMMUNITY)
Admission: EM | Admit: 2013-11-30 | Discharge: 2013-11-30 | Disposition: A | Discharge status: Home / Self Care | Payer: Self-pay | Attending: Emergency Medicine | Admitting: Emergency Medicine

## 2013-11-30 ENCOUNTER — Emergency Department (HOSPITAL_COMMUNITY): Payer: Self-pay

## 2013-11-30 DIAGNOSIS — Z7982 Long term (current) use of aspirin: Secondary | ICD-10-CM | POA: Insufficient documentation

## 2013-11-30 DIAGNOSIS — I10 Essential (primary) hypertension: Secondary | ICD-10-CM | POA: Insufficient documentation

## 2013-11-30 DIAGNOSIS — K5900 Constipation, unspecified: Secondary | ICD-10-CM | POA: Insufficient documentation

## 2013-11-30 DIAGNOSIS — Z8673 Personal history of transient ischemic attack (TIA), and cerebral infarction without residual deficits: Secondary | ICD-10-CM | POA: Insufficient documentation

## 2013-11-30 DIAGNOSIS — Z79899 Other long term (current) drug therapy: Secondary | ICD-10-CM | POA: Insufficient documentation

## 2013-11-30 HISTORY — DX: Essential (primary) hypertension: I10

## 2013-11-30 HISTORY — DX: Cerebral infarction, unspecified: I63.9

## 2013-11-30 MED ORDER — BISACODYL 5 MG PO TBEC: 5.0000 mg | DELAYED_RELEASE_TABLET | Freq: Once | ORAL | Status: DC

## 2013-11-30 MED ORDER — MAGNESIUM CITRATE PO SOLN: 0.5000 | Freq: Once | ORAL | Status: DC

## 2013-11-30 NOTE — ED Provider Notes (Signed)
CSN: [id number]     Arrival date & time 11/30/13  4098 History   First MD Initiated Contact with Patient 11/30/13 [phone]     Chief Complaint  Patient presents with  . Constipation   (Consider location/radiation/quality/duration/timing/severity/associated sxs/prior Treatment) The history is provided by the patient. No language interpreter was used.  Benjamin Cohen is a 57 year old male with past medical history of hypertension with a CVA diagnosed on 11/12/2013-discharge 11/17/2013 (left caudate nucleus, basal ganglia, corona reviewed infarct do to small vessel disease identified), presenting to emergency portal constipation. This reports his last bowel movement was approximate one week ago, last Monday. Patient reports that he has never had issue with bowel movements. Reports he is still passing flatulence. States that he has been straining, has the urge to go to the bathroom but nothing comes out. Patient reports he's been using MiraLax, reported using MiraLax on Thursday, one time-denied any results. Denied abdominal pain, chest pain, shortness of breath, difficulty breathing, back pain, hematuria, dysuria. PCP none  Past Medical History  Diagnosis Date  . Stroke   . Hypertension    History reviewed. No pertinent past surgical history. History reviewed. No pertinent family history. History  Substance Use Topics  . Smoking status: Never Smoker   . Smokeless tobacco: Not on file  . Alcohol Use: No    Review of Systems  Constitutional: Negative for fever and chills.  Respiratory: Negative for chest tightness and shortness of breath.   Cardiovascular: Negative for chest pain.  Gastrointestinal: Positive for constipation. Negative for nausea, vomiting, abdominal pain and blood in stool.  Genitourinary: Negative for dysuria, hematuria and decreased urine volume.  Musculoskeletal: Negative for back pain and neck pain.  All other systems reviewed and are negative.    Allergies  Review  of patient's allergies indicates no known allergies.  Home Medications   Current Outpatient Rx  Name  Route  Sig  Dispense  Refill  . aspirin EC 325 MG EC tablet   Oral   Take 1 tablet (325 mg total) by mouth daily.   30 tablet   0   . atorvastatin (LIPITOR) 10 MG tablet   Oral   Take 1 tablet (10 mg total) by mouth daily at 6 PM.   30 tablet   2   . hydrochlorothiazide (HYDRODIURIL) 25 MG tablet   Oral   Take 1 tablet (25 mg total) by mouth daily.   30 tablet   2   . bisacodyl (DULCOLAX) 5 MG EC tablet   Oral   Take 1 tablet (5 mg total) by mouth once.   14 tablet   0   . magnesium citrate SOLN   Oral   Take 148 mLs (0.5 Bottles total) by mouth once.   195 mL   0    BP 145/97  Pulse 63  Temp(Src) 98.5 F (36.9 C) (Oral)  Resp 18  SpO2 96% Physical Exam  Nursing note and vitals reviewed. Constitutional: He is oriented to person, place, and time. He appears well-developed and well-nourished. No distress.  HENT:  Head: Normocephalic and atraumatic.  Neck: Normal range of motion. Neck supple.  Cardiovascular: Normal rate, regular rhythm and normal heart sounds.  Exam reveals no friction rub.   No murmur heard. Pulses:      Radial pulses are 2+ on the right side, and 2+ on the left side.       Dorsalis pedis pulses are 2+ on the right side, and 2+ on the left  side.  Pulmonary/Chest: Effort normal and breath sounds normal. No respiratory distress. He has no wheezes.  Abdominal: Soft. Bowel sounds are normal. There is tenderness. There is no guarding.  Mild discomfort upon palpation to the lower abdomen bilaterally Negative acute abdomen, negative peritoneal signs  Musculoskeletal: Normal range of motion.  Full ROM to upper and lower extremities without difficulty noted, negative ataxia noted  Lymphadenopathy:    He has no cervical adenopathy.  Neurological: He is alert and oriented to person, place, and time. He exhibits normal muscle tone. Coordination  normal.  Skin: Skin is warm and dry. No rash noted. He is not diaphoretic. No erythema.  Psychiatric: He has a normal mood and affect. His behavior is normal. Thought content normal.    ED Course  Procedures (including critical care time)  Dg Abd 1 View  11/30/2013   CLINICAL DATA:  Constipation for the past 4 days.  EXAM: ABDOMEN - 1 VIEW  COMPARISON:  No priors.  FINDINGS: Gas and stool are seen scattered throughout the colon extending to the level of the distal rectum. Overall, the volume of well-formed stool throughout the colon is moderate. No pathologic distension of small bowel is noted. No gross evidence of pneumoperitoneum.  IMPRESSION: 1.  Nonobstructive bowel gas pattern. 2. No pneumoperitoneum. 3. Moderate stool burden.   Electronically Signed   By: Trudie Reed M.D.   On: 11/30/2013 11:02    Labs Review Labs Reviewed - No data to display Imaging Review Dg Abd 1 View  11/30/2013   CLINICAL DATA:  Constipation for the past 4 days.  EXAM: ABDOMEN - 1 VIEW  COMPARISON:  No priors.  FINDINGS: Gas and stool are seen scattered throughout the colon extending to the level of the distal rectum. Overall, the volume of well-formed stool throughout the colon is moderate. No pathologic distension of small bowel is noted. No gross evidence of pneumoperitoneum.  IMPRESSION: 1.  Nonobstructive bowel gas pattern. 2. No pneumoperitoneum. 3. Moderate stool burden.   Electronically Signed   By: Trudie Reed M.D.   On: 11/30/2013 11:02    EKG Interpretation   None       MDM   1. Constipation     Filed Vitals:   11/30/13 1000 11/30/13 1045 11/30/13 1100 11/30/13 1115  BP: 133/82 149/90 152/98 145/97  Pulse: 57 64 62 63  Temp:      TempSrc:      Resp:      SpO2: 97% 95% 97% 96%    Patient presenting to emergency department with constipation has been ongoing for the past week. Reported that his last bowel movement was last Sunday. Reported that he used over-the-counter MiraLax  with negative relief. Alert and oriented. GCS 15. Heart rate and rhythm normal. Pulses palpable and strong, radial and DP 2+ bilaterally. Lungs good auscultation lobes. Abdomen negative distention identified, negative fluid wave. Bowel sounds normoactive in all 4 quadrants. Discomfort upon palpation to the lower abdominal region, suprapubic. Full range of motion to extremities without difficulty. Abdominal 1 view identified nondestructive bowel gas, no pneumoperitoneum-moderate amount of stool identified. Doubt acute abdomen. Doubt obstruction. Suspicion to be constipation. Patient stable, afebrile. Discharge patient with stool softeners and magnesium citrate. Discussed with patient to rest and stay hydrated. Discussed with patient that if he does not have a bowel movement within the next 2 days to report back to emergency department.Discussed with patient to closely monitor symptoms and if symptoms are to worsen or change to report back  to the ED - strict return instructions given.  Patient agreed to plan of care, understood, all questions answered.       Raymon Mutton, PA-C 12/01/13 1900

## 2013-11-30 NOTE — ED Notes (Signed)
Pt reports he has not had a bowel movement since last Sunday. Pt states it feels like he needs to go but "nothing comes out." Pt reports he took OTC Mirilax on Thursday one time and reports "it didn't work." Pt denies abd pain or any other symptoms.

## 2013-12-01 NOTE — ED Provider Notes (Signed)
Medical screening examination/treatment/procedure(s) were performed by non-physician practitioner and as supervising physician I was immediately available for consultation/collaboration.  EKG Interpretation   None         Rolland Porter, MD 12/01/13 2256

## 2013-12-16 DIAGNOSIS — Z0289 Encounter for other administrative examinations: Secondary | ICD-10-CM

## 2013-12-16 NOTE — Telephone Encounter (Signed)
I called and let patient know I will have forms signed and faxed out today.

## 2013-12-17 ENCOUNTER — Ambulatory Visit: Payer: Self-pay

## 2013-12-18 ENCOUNTER — Encounter: Payer: Self-pay | Admitting: Neurology

## 2013-12-24 ENCOUNTER — Ambulatory Visit: Payer: Self-pay

## 2013-12-25 ENCOUNTER — Telehealth: Payer: Self-pay | Admitting: Neurology

## 2013-12-25 NOTE — Telephone Encounter (Signed)
Patient's friend calling to state that patient would like to schedule sooner appointment for returning to work assessment since he really needs to get back to work. Patient would like sooner than already scheduled 01/28/14 appointment. Please call to schedule.

## 2013-12-26 NOTE — Telephone Encounter (Signed)
Pt has appt on 12/31/13 at 2:00 p.m.

## 2013-12-26 NOTE — Telephone Encounter (Signed)
RETURNING CALL  °

## 2013-12-31 ENCOUNTER — Encounter (INDEPENDENT_AMBULATORY_CARE_PROVIDER_SITE_OTHER): Payer: Self-pay

## 2013-12-31 ENCOUNTER — Ambulatory Visit: Payer: Self-pay

## 2013-12-31 ENCOUNTER — Encounter: Payer: Self-pay | Admitting: Nurse Practitioner

## 2013-12-31 ENCOUNTER — Ambulatory Visit (INDEPENDENT_AMBULATORY_CARE_PROVIDER_SITE_OTHER): Payer: 59 | Admitting: Nurse Practitioner

## 2013-12-31 VITALS — BP 151/96 | HR 102 | Wt 212.0 lb

## 2013-12-31 DIAGNOSIS — I639 Cerebral infarction, unspecified: Secondary | ICD-10-CM

## 2013-12-31 DIAGNOSIS — I6350 Cerebral infarction due to unspecified occlusion or stenosis of unspecified cerebral artery: Secondary | ICD-10-CM

## 2013-12-31 NOTE — Patient Instructions (Addendum)
PLAN: Continue aspirin 325 mg orally every day for secondary stroke prevention and maintain strict control of hypertension with blood pressure goal below 130/90, diabetes with hemoglobin A1c goal below 6.5% and lipids with LDL cholesterol goal below 70 mg/dL.  Followup in the future in 6 months.

## 2013-12-31 NOTE — Progress Notes (Signed)
PATIENT: Benjamin Cohen DOB: 28-Dec-1955   REASON FOR VISIT: post-hospital follow up for stroke HISTORY FROM: patient  HISTORY OF PRESENT ILLNESS: 58 yo M, history of HTN with medication non-compliance, presented 12/3 with left face, left arm, and left leg weakness, as well as dysarthria. Patient last normal at 1:00 AM, given TPA at 2:38 AM on 12/3. Symptoms only mildly improved. 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 BP Elevated to 202/120 on admission, and patient placed on nicardipine drip. He was admitted to the neuro ICU for further evaluation and treatment. He had emergent cerebral catheter angiogram by Dr. Corliss Skainseveshwar which did not show any large vessel occlusion.  MRI of the brain showed an acute infarct involving the right corona radiata and posterior lentiform nucleus. No evidence of acute hemorrhage. Remote left basal ganglia infarct and moderate chronic microvascular ischemic disease.  MRA of the brain showed no evidence of intracranial arterial high-grade stenosis, occlusion, or aneurysm, though scattered intracranial atherosclerosis.  2D Echocardiogram with EF 55-60%, grade 1 diastolic dysfunction. Carotid Doppler showed no evidence of hemodynamically significant internal carotid artery stenosis. He has had a good recovery, with only mild dysarthria.  REVIEW OF SYSTEMS: Full 14 system review of systems performed and notable only for:  Neurological: headache  ALLERGIES: No Known Allergies  HOME MEDICATIONS: Outpatient Prescriptions Prior to Visit  Medication Sig Dispense Refill  . aspirin EC 325 MG EC tablet Take 1 tablet (325 mg total) by mouth daily.  30 tablet  0  . atorvastatin (LIPITOR) 10 MG tablet Take 1 tablet (10 mg total) by mouth daily at 6 PM.  30 tablet  2  . bisacodyl (DULCOLAX) 5 MG EC tablet Take 1 tablet (5 mg total) by mouth once.  14 tablet  0  . hydrochlorothiazide  (HYDRODIURIL) 25 MG tablet Take 1 tablet (25 mg total) by mouth daily.  30 tablet  2  . magnesium citrate SOLN Take 148 mLs (0.5 Bottles total) by mouth once.  195 mL  0   No facility-administered medications prior to visit.    PAST MEDICAL HISTORY: Past Medical History  Diagnosis Date  . Stroke   . Hypertension     PAST SURGICAL HISTORY: No past surgical history on file.  FAMILY HISTORY: No family history on file.  SOCIAL HISTORY: History   Social History  . Marital Status: Single    Spouse Name: N/A    Number of Children: 3  . Years of Education: 12th   Occupational History  . N/A    Social History Main Topics  . Smoking status: Never Smoker   . Smokeless tobacco: Not on file  . Alcohol Use: No  . Drug Use: No  . Sexual Activity: Yes   Other Topics Concern  . Not on file   Social History Narrative  . No narrative on file     PHYSICAL EXAM  Filed Vitals:   12/31/13 1359  BP: 151/96  Pulse: 102  Weight: 212 lb (96.163 kg)   Body mass index is 32.24 kg/(m^2).  Generalized: Well developed, in no acute distress, well groomed. Head: normocephalic and atraumatic. Oropharynx benign  Neck: Supple, no carotid bruits  Cardiac: Regular rate rhythm, no murmur  Musculoskeletal: No deformity   Neurological examination  Mentation: Alert oriented to time, place, history taking. Follows all commands, mild dysarthria Cranial nerve II-XII: Fundoscopic exam reveals sharp disc margins.Pupils were equal  round reactive to light extraocular movements were full, visual field were full on confrontational test. - left lower facial droop. hearing was intact to finger rubbing bilaterally. Left Tongue deviation. Head turning and shoulder shrug and were normal and symmetric. Motor: normal bulk and tone, full strength in the BUE, BLE, fine finger movements normal, no pronator drift. No focal weakness Sensory: normal and symmetric to light touch Coordination: finger-nose-finger,  heel-to-shin bilaterally, no dysmetria Reflexes:  Deep tendon reflexes in the upper and lower extremities are present and symmetric.  Gait and Station: Rising up from seated position without assistance, normal stance, without trunk ataxia, moderate stride, good arm swing, smooth turning, able to perform tiptoe, and heel walking without difficulty.   DIAGNOSTIC DATA (LABS, IMAGING, TESTING) - I reviewed patient records, labs, notes, testing and imaging myself where available.  Lab Results  Component Value Date   WBC 7.5 11/12/2013   HGB 16.7 11/12/2013   HCT 49.0 11/12/2013   MCV 82.9 11/12/2013   PLT 244 11/12/2013      Component Value Date/Time   NA 141 11/15/2013 0430   K 3.6 11/15/2013 0430   CL 104 11/15/2013 0430   CO2 26 11/15/2013 0430   GLUCOSE 112* 11/15/2013 0430   BUN 14 11/15/2013 0430   CREATININE 0.94 11/15/2013 0430   CALCIUM 8.8 11/15/2013 0430   PROT 7.4 11/12/2013 0159   ALBUMIN 4.2 11/12/2013 0159   AST 22 11/12/2013 0159   ALT 22 11/12/2013 0159   ALKPHOS 91 11/12/2013 0159   BILITOT 0.4 11/12/2013 0159   GFRNONAA 90* 11/15/2013 0430   GFRAA >90 11/15/2013 0430   Lab Results  Component Value Date   CHOL 216* 11/12/2013   HDL 45 11/12/2013   LDLCALC 140* 11/12/2013   TRIG 157* 11/12/2013   CHOLHDL 4.8 11/12/2013   Lab Results  Component Value Date   HGBA1C 6.8* 11/12/2013    ASSESSMENT AND PLAN [name] is a 58 y.o. male presenting with L face and extremity weakness and dysarthria. Status post IV t-PA at 2:38 AM 12/3. 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 Infarct felt to be thrombotic secondary to small vessel disease.  Patient with resultant mild dysarthria. Vascular risk factors of hypertension, newly dx diabetes, and hyperlipidemia.  PLAN: Continue aspirin 325 mg orally every day for secondary stroke prevention and maintain strict control of hypertension with blood pressure goal below 130/90, diabetes  with hemoglobin A1c goal below 6.5% and lipids with LDL cholesterol goal below 70 mg/dL.  Followup in the future in 6 months. Back to work with 4 hour/day restrictions given during this visit.  Ronal Fear, MSN, NP-C 12/31/2013, 2:27 PM [facility] [address] [place], [place] [address] [phone]  Note: This document was prepared with digital dictation and possible smart phrase technology. Any transcriptional errors that result from this process are unintentional.

## 2014-01-01 ENCOUNTER — Telehealth: Payer: Self-pay

## 2014-01-01 NOTE — Telephone Encounter (Signed)
Patient called back. I let him know form was ready and I would like to discuss it with him. Please ask for me when he comes to pick it up.

## 2014-01-01 NOTE — Telephone Encounter (Signed)
Matter addressed. Patient seen on 01.21.2015

## 2014-01-01 NOTE — Telephone Encounter (Signed)
I called patient to discuss back to work form. He stated he had to call me back. I will await his call.

## 2014-01-12 ENCOUNTER — Telehealth: Payer: Self-pay | Admitting: Nurse Practitioner

## 2014-01-12 NOTE — Telephone Encounter (Signed)
I called patients supervisor, while patient waited, and was told he is out sick until Wednesday.  I spoke to patient and relayed this information.  I will call back on Wednesday to see what information his job is looking for since his form has been completed.

## 2014-01-14 NOTE — Telephone Encounter (Signed)
Spoke to patient's boss, Jeanann LewandowskyRichard Whitehead, he said he will check with their HR department to see if the patient needs a letter stating he can return to work full time after 01-19-14 without restrictions.  I told him that this information was written on his Transitional Duty Form.  He will let me know if HR needs additional documentation, if not he will not call back.  I have left a message for the patient and relayed this information.

## 2014-01-28 ENCOUNTER — Ambulatory Visit: Payer: Self-pay | Admitting: Nurse Practitioner

## 2014-02-11 ENCOUNTER — Telehealth: Payer: Self-pay

## 2014-02-11 NOTE — Telephone Encounter (Signed)
[facility] sent a fax requesting we refill Lipitor 10mg  and HTCZ 25mg .  Since we have not previously filled these medications, I wanted see if it's okay to send refills or if patient should request from PCP.  Please advise.  Thank you.

## 2014-02-11 NOTE — Telephone Encounter (Signed)
Please request that he gets them filled with his primary. Thanks.

## 2014-02-12 NOTE — Telephone Encounter (Signed)
I contacted the pharmacy and asked that they request refills from PCP.

## 2014-07-03 ENCOUNTER — Ambulatory Visit (INDEPENDENT_AMBULATORY_CARE_PROVIDER_SITE_OTHER): Payer: 59 | Admitting: Nurse Practitioner

## 2014-07-03 ENCOUNTER — Encounter: Payer: Self-pay | Admitting: Nurse Practitioner

## 2014-07-03 VITALS — BP 152/99 | HR 68 | Temp 98.4°F | Ht 67.0 in | Wt 216.0 lb

## 2014-07-03 DIAGNOSIS — I6302 Cerebral infarction due to thrombosis of basilar artery: Secondary | ICD-10-CM

## 2014-07-03 DIAGNOSIS — I6322 Cerebral infarction due to unspecified occlusion or stenosis of basilar arteries: Secondary | ICD-10-CM

## 2014-07-03 MED ORDER — ATORVASTATIN CALCIUM 10 MG PO TABS: 10.0000 mg | ORAL_TABLET | Freq: Every day | ORAL | Status: DC

## 2014-07-03 MED ORDER — HYDROCHLOROTHIAZIDE 25 MG PO TABS: 25.0000 mg | ORAL_TABLET | Freq: Every day | ORAL | Status: DC

## 2014-07-03 NOTE — Patient Instructions (Addendum)
Continue aspirin 325 mg orally every day for secondary stroke prevention and maintain strict control of hypertension with blood pressure goal below 140/90, diabetes with hemoglobin A1c goal below 7% and lipids with LDL cholesterol goal below 100 mg/dL. Repeat Lipid panel, Hgba1c and Cmp labs today. Follow up in 6 months, sooner as needed.  Stroke Prevention Some medical conditions and behaviors are associated with an increased chance of having a stroke. You may prevent a stroke by making healthy choices and managing medical conditions. HOW CAN I REDUCE MY RISK OF HAVING A STROKE?   Stay physically active. Get at least 30 minutes of activity on most or all days.  Do not smoke. It may also be helpful to avoid exposure to secondhand smoke.  Limit alcohol use. Moderate alcohol use is considered to be:  No more than 2 drinks per day for men.  No more than 1 drink per day for nonpregnant women.  Eat healthy foods. This involves:  Eating 5 or more servings of fruits and vegetables a day.  Making dietary changes that address high blood pressure (hypertension), high cholesterol, diabetes, or obesity.  Manage your cholesterol levels.  Making food choices that are high in fiber and low in saturated fat, trans fat, and cholesterol may control cholesterol levels.  Take any prescribed medicines to control cholesterol as directed by your health care provider.  Manage your diabetes.  Controlling your carbohydrate and sugar intake is recommended to manage diabetes.  Take any prescribed medicines to control diabetes as directed by your health care provider.  Control your hypertension.  Making food choices that are low in salt (sodium), saturated fat, trans fat, and cholesterol is recommended to manage hypertension.  Take any prescribed medicines to control hypertension as directed by your health care provider.  Maintain a healthy weight.  Reducing calorie intake and making food choices that  are low in sodium, saturated fat, trans fat, and cholesterol are recommended to manage weight.  Stop drug abuse.  Avoid taking birth control pills.  Talk to your health care provider about the risks of taking birth control pills if you are over 60 years old, smoke, get migraines, or have ever had a blood clot.  Get evaluated for sleep disorders (sleep apnea).  Talk to your health care provider about getting a sleep evaluation if you snore a lot or have excessive sleepiness.  Take medicines only as directed by your health care provider.  For some people, aspirin or blood thinners (anticoagulants) are helpful in reducing the risk of forming abnormal blood clots that can lead to stroke. If you have the irregular heart rhythm of atrial fibrillation, you should be on a blood thinner unless there is a good reason you cannot take them.  Understand all your medicine instructions.  Make sure that other conditions (such as anemia or atherosclerosis) are addressed. SEEK IMMEDIATE MEDICAL CARE IF:   You have sudden weakness or numbness of the face, arm, or leg, especially on one side of the body.  Your face or eyelid droops to one side.  You have sudden confusion.  You have trouble speaking (aphasia) or understanding.  You have sudden trouble seeing in one or both eyes.  You have sudden trouble walking.  You have dizziness.  You have a loss of balance or coordination.  You have a sudden, severe headache with no known cause.  You have new chest pain or an irregular heartbeat. Any of these symptoms may represent a serious problem that is an  emergency. Do not wait to see if the symptoms will go away. Get medical help at once. Call your local emergency services (911 in [place] Do not drive yourself to the hospital. Document Released: 01/04/2005 Document Revised: 04/13/2014 Document Reviewed: 05/30/2013 [facility] Patient Information 2015 [place], [place]. This information is not intended to  replace advice given to you by your health care provider. Make sure you discuss any questions you have with your health care provider.

## 2014-07-03 NOTE — Progress Notes (Signed)
PATIENT: Benjamin Cohen DOB: 02-Dec-1956  REASON FOR VISIT: routine follow up for stroke HISTORY FROM: patient  HISTORY OF PRESENT ILLNESS: 58 yo Male, history of HTN with medication non-compliance, [facility]/12/13 with left face, left arm, and left leg weakness, as well as dysarthria. Patient last normal at 1:00 AM, given TPA at 2:38 AM on 12/3. Symptoms only mildly improved. 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 BP Elevated to 202/120 on admission, and patient placed on nicardipine drip. He was admitted to the neuro ICU for further evaluation and treatment. He had emergent cerebral catheter angiogram by Dr. Corliss Skainseveshwar which did not show any large vessel occlusion. MRI of the brain showed an acute infarct involving the right corona radiata and posterior lentiform nucleus. No evidence of acute hemorrhage. Remote left basal ganglia infarct and moderate chronic microvascular ischemic disease. MRA of the brain showed no evidence of intracranial arterial high-grade stenosis, occlusion, or aneurysm, though scattered intracranial atherosclerosis. 2D Echocardiogram with EF 55-60%, grade 1 diastolic dysfunction. Carotid Doppler showed no evidence of hemodynamically significant internal carotid artery stenosis. He has had a good recovery, with only mild dysarthria.   Update 07/03/14 (LL): [name] returns for stroke followup, last visit was 12/31/2013 with me. Since last visit, patient states he is exercising regularly and has changed his diet, not eating junk foods and decreased salt intake. He stopped taking his Lipitor, blood pressure medicine and aspirin, for no reason.  He states the blood pressure is good although it is elevated at152/99 in the office today. He does not check his blood pressure at home. He does not identify any residual deficits from the stroke. He has no complaints today.  REVIEW  OF SYSTEMS: Full 14 system review of systems performed and notable only for: nothing.  ALLERGIES: No Known Allergies  HOME MEDICATIONS: Outpatient Prescriptions Prior to Visit  Medication Sig Dispense Refill  . aspirin EC 325 MG EC tablet Take 1 tablet (325 mg total) by mouth daily.  30 tablet  0  . atorvastatin (LIPITOR) 10 MG tablet Take 1 tablet (10 mg total) by mouth daily at 6 PM.  30 tablet  2  . hydrochlorothiazide (HYDRODIURIL) 25 MG tablet Take 1 tablet (25 mg total) by mouth daily.  30 tablet  2  . bisacodyl (DULCOLAX) 5 MG EC tablet Take 1 tablet (5 mg total) by mouth once.  14 tablet  0  . magnesium citrate SOLN Take 148 mLs (0.5 Bottles total) by mouth once.  195 mL  0   No facility-administered medications prior to visit.    PHYSICAL EXAM Filed Vitals:   07/03/14 1331  BP: 152/99  Pulse: 68  Temp: 98.4 F (36.9 C)  TempSrc: Oral  Height: 5\' 7"  (1.702 m)  Weight: 216 lb (97.977 kg)   Body mass index is 33.82 kg/(m^2).  Generalized: Well developed, in no acute distress, well groomed.  Head: normocephalic and atraumatic. Oropharynx benign  Neck: Supple, no carotid bruits  Cardiac: Regular rate rhythm, no murmur  Musculoskeletal: No deformity   Neurological examination  Mentation: Alert oriented to time, place, history taking. Follows all commands. Speech fluent no dysarthria noted.  Cranial nerve II-XII: Fundoscopic exam reveals sharp disc margins.Pupils were equal round reactive to light extraocular movements were full, visual field were full on confrontational test. - left lower facial droop. hearing was intact to finger rubbing bilaterally. Left  Tongue deviation. Head turning and shoulder shrug and were normal and symmetric.  Motor: normal bulk and tone, full strength in the BUE, BLE, fine finger movements normal, no pronator drift. No focal weakness  Sensory: normal and symmetric to light touch  Coordination: finger-nose-finger, heel-to-shin bilaterally, no  dysmetria  Reflexes: Deep tendon reflexes in the upper and lower extremities are present and symmetric.  Gait and Station: Rising up from seated position without assistance, normal stance, without trunk ataxia, moderate stride, good arm swing, smooth turning, able to perform tiptoe, and heel walking without difficulty.   ASSESSMENT: [name] is a 58 y.o. male presenting with L face and extremity weakness and dysarthria. Status post IV t-PA. Imaging confirmed a left caudate nucleus and basal ganglia infarct extending to L corona radiata. Infarct felt to be thrombotic secondary to small vessel disease. Patient with no residual deficits. Vascular risk factors of hypertension, newly dx diabetes, and hyperlipidemia.  Has been noncompliant with medication.  PLAN:  Restart aspirin 325 mg orally every day for secondary stroke prevention and maintain strict control of hypertension with blood pressure goal below 140/90, diabetes with hemoglobin A1c goal below 7% and lipids with LDL cholesterol goal below 100 mg/dL. Repeat Lipid panel, Hgba1c and Cmp. Stressed the importance of compliance with taking medications; recommend restarting atorvastatin and hydrochlorothiazide. Follow up in 6 months, sooner as needed.  Orders Placed This Encounter  Procedures  . Lipid Panel  . Hemoglobin A1C  . Comprehensive metabolic panel   Return in about 6 months (around 01/03/2015) for Dr. Pearlean Brownie or Roda Shutters next time.  Tawny Asal Lavone Weisel, MSN, FNP-BC, A/GNP-C 07/03/2014, 2:04 PM [facility] [address], [address] [place], [place] [address] [phone]  Note: This document was prepared with digital dictation and possible smart phrase technology. Any transcriptional errors that result from this process are unintentional.

## 2015-01-04 ENCOUNTER — Ambulatory Visit (INDEPENDENT_AMBULATORY_CARE_PROVIDER_SITE_OTHER): Payer: 59 | Admitting: Neurology

## 2015-01-04 ENCOUNTER — Encounter: Payer: Self-pay | Admitting: Neurology

## 2015-01-04 VITALS — BP 167/104 | HR 82 | Ht 67.0 in | Wt 218.4 lb

## 2015-01-04 DIAGNOSIS — I63331 Cerebral infarction due to thrombosis of right posterior cerebral artery: Secondary | ICD-10-CM

## 2015-01-04 DIAGNOSIS — I160 Hypertensive urgency: Secondary | ICD-10-CM

## 2015-01-04 DIAGNOSIS — I10 Essential (primary) hypertension: Secondary | ICD-10-CM

## 2015-01-04 DIAGNOSIS — E785 Hyperlipidemia, unspecified: Secondary | ICD-10-CM

## 2015-01-04 MED ORDER — LISINOPRIL 10 MG PO TABS: 10.0000 mg | ORAL_TABLET | Freq: Every day | ORAL | Status: DC

## 2015-01-04 MED ORDER — ATORVASTATIN CALCIUM 10 MG PO TABS: 10.0000 mg | ORAL_TABLET | Freq: Every day | ORAL | Status: DC

## 2015-01-04 NOTE — Progress Notes (Signed)
STROKE NEUROLOGY FOLLOW UP NOTE  NAME: Benjamin Cohen DOB: 03-29-1956  REASON FOR VISIT: stroke follow up HISTORY FROM: chart and pt  Today we had the pleasure of seeing Benjamin Cohen in follow-up at our Neurology Clinic. Pt was accompanied by no one.   History Summary 59 yo M with history of HTN with medication non-compliance was admitted on 11/12/13 with left face, left arm, and left leg weakness, as well as dysarthria. CT showed old left infarct involving the caudate nucleus and basal ganglia, extending to the left corona radiata. He was given tPA. BP Elevated to 202/120 on admission, and patient placed on nicardipine drip. He had emergent cerebral catheter angiogram by Dr. Corliss Skainseveshwar which did not show any large vessel occlusion. MRI of the brain showed an acute infarct involving the right corona radiata and posterior lentiform nucleus and remote left basal ganglia infarct and moderate chronic microvascular ischemic disease. MRA of the brain showed no evidence of intracranial arterial high-grade stenosis, occlusion, or aneurysm, though scattered intracranial atherosclerosis. 2D Echocardiogram with EF 55-60%, grade 1 diastolic dysfunction. Carotid Doppler showed no evidence of hemodynamically significant internal carotid artery stenosis. He has had a good recovery, with only mild dysarthria.  Follow-up 07/03/14 (LL): Benjamin Cohen returns for stroke followup, last visit was 12/31/2013 with me. Since last visit, patient states he is exercising regularly and has changed his diet, not eating junk foods and decreased salt intake. He stopped taking his Lipitor, blood pressure medicine and aspirin, for no reason. He states the blood pressure is good although it is elevated at152/99 in the office today. He does not check his blood pressure at home. He does not identify any residual deficits from the stroke. He has no complaints today.   Interval History During the interval time, the patient has been  doing well. He is on ASA 325mg . However, he did not take statin or BP meds. His BP today is  167/104. He has not established PCP yet.  REVIEW OF SYSTEMS: Full 14 system review of systems performed and notable only for those listed below and in HPI above, all others are negative:  Constitutional: N/A  Cardiovascular: N/A  Ear/Nose/Throat: N/A  Skin: N/A  Eyes: N/A  Respiratory: N/A  Gastroitestinal: N/A  Genitourinary: N/A Hematology/Lymphatic: N/A  Endocrine: N/A  Musculoskeletal: N/A  Allergy/Immunology: N/A  Neurological: N/A  Psychiatric: N/A  The following represents the patient's updated allergies and side effects list: No Known Allergies  The neurologically relevant items on the patient's problem list were reviewed on today's visit.  Neurologic Examination  A problem focused neurological exam (12 or more points of the single system neurologic examination, vital signs counts as 1 point, cranial nerves count for 8 points) was performed.  Blood pressure 167/104, pulse 82, height 5\' 7"  (1.702 m), weight 218 lb 6.4 oz (99.066 kg).  General - Well nourished, well developed, in no apparent distress.  Ophthalmologic - Sharp disc margins OU.  Cardiovascular - Regular rate and rhythm with no murmur.  Mental Status -  Level of arousal and orientation to time, place, and person were intact. Language including expression, naming, repetition, comprehension was assessed and found intact.  Cranial Nerves II - XII - II - Visual field intact OU. III, IV, VI - Extraocular movements intact. V - Facial sensation intact bilaterally. VII - Facial movement intact bilaterally. VIII - Hearing & vestibular intact bilaterally. X - Palate elevates symmetrically. XI - Chin turning & shoulder shrug intact bilaterally. XII -  Tongue protrusion intact.  Motor Strength - The patient's strength was normal in all extremities and pronator drift was absent.  Bulk was normal and fasciculations were  absent.   Motor Tone - Muscle tone was assessed at the neck and appendages and was normal.  Reflexes - The patient's reflexes were normal in all extremities and he had no pathological reflexes.  Sensory - Light touch, temperature/pinprick were assessed and were normal.    Coordination - The patient had normal movements in the hands and feet with no ataxia or dysmetria.  Tremor was absent.  Gait and Station - The patient's transfers, posture, gait, station, and turns were observed as normal.  Data reviewed: I personally reviewed the images and agree with the radiology interpretations.  CT head 11/12/13 1. Hypoattenuation within the left caudate head and basal ganglia, extending into the left corona radiata. Favored to be remote, given absence of mass effect and resultant ex vacuo dilatation of the anterior horn lateral ventricle. These results were called by telephone at the time of interpretation on 11/12/2013 at 2:21 AM to Dr. Roseanne Reno, who verbally acknowledged these results. 2. Mild small vessel ischemic change. 3. Sinus disease.  CT head 11/12/13 1. No evidence of intracranial hemorrhage status post tPA administration. 2. New 1.2 x 0.9 cm hypodensity involving the right putamen with extension into the right corona radiata, consistent with acute ischemic infarct. 3. Stable appearance of hypodensity involving the left caudate head with extension into the left coronal radiata. Again, this finding is favored to be chronic in nature.  MRI and MRA 11/13/13 1. Acute infarct involving the right corona radiata and posterior lentiform nucleus. No evidence of acute hemorrhage. 2. Remote left basal ganglia infarct and moderate chronic microvascular ischemic disease. 3. No evidence of intracranial arterial high-grade stenosis, occlusion, or aneurysm.  CUS - - The vertebral arteries appear patent with antegrade flow. - Findings consistent with less than 39 percent stenosis involving  the right internal carotid artery and the left internal carotid artery.  2D echo - - Normal LV size and systolic function, EF 55-60%. Mild LV hypertrophy. Normal RV size and systolic function. No significant valvular abnormalities.  Component     Latest Ref Rng 11/12/2013  Cholesterol     0 - 200 mg/dL 841 (H)  Triglycerides     <150 mg/dL 324 (H)  HDL     >40 mg/dL 45  Total CHOL/HDL Ratio      4.8  VLDL     0 - 40 mg/dL 31  LDL (calc)     0 - 99 mg/dL 102 (H)  Hgb V2Z MFr Bld     <5.7 % 6.8 (H)  Mean Plasma Glucose     <117 mg/dL 366 (H)    Assessment: As you may recall, he is a 59 y.o. African American male with PMH of HTN with medication non-compliance was admitted on 11/12/13 for acute right BG stroke. CT and MRI also showed left old BG stroke in the past. His BP was high requiring cardene drip. His stroke likely due to small vessel disease. However, he still is not compliant with medication. So far only on ASA, not on BP meds or statin. No PCP yet.   Plan:  - continue ASA for stroke prevention - establish PCP ASAP - Follow up with your primary care physician for stroke risk factor modification. Recommend maintain blood pressure goal <130/80, diabetes with hemoglobin A1c goal below 6.5% and lipids with LDL cholesterol goal below 70  mg/dL.  - put on lipitor   - put on lisinopril   - check BP at home - check CMP, lipid panel and A1C levels today. - RTC in 3 months.  Orders Placed This Encounter  Procedures  . Lipid Panel  . Hemoglobin A1c  . CMP    Meds ordered this encounter  Medications  . atorvastatin (LIPITOR) 10 MG tablet    Sig: Take 1 tablet (10 mg total) by mouth daily.    Dispense:  30 tablet    Refill:  2  . lisinopril (PRINIVIL,ZESTRIL) 10 MG tablet    Sig: Take 1 tablet (10 mg total) by mouth daily.    Dispense:  30 tablet    Refill:  2    Patient Instructions  - continue ASA for stroke prevention - you need to established a  primary care physician ASAP to follow up with your stroke risk factors - will draw blood for lipid panel and A1C - will prescribe you lisinopril  for blood pressure - buy BP monitoring device and check you BP at home and record and see the trend of your BP - will prescribe low dose lipitor  daily for stroke prevention and wait for the lipid panel to adjust the medication dose. - regular exercise - healthy diet, low salt diet - follow up in 3 months.   Marvel Plan, MD PhD [facility] [address], [address] [place], [place] [address] [phone]

## 2015-01-04 NOTE — Patient Instructions (Signed)
-   continue ASA for stroke prevention - you need to established a primary care physician ASAP to follow up with your stroke risk factors - will draw blood for lipid panel and A1C - will prescribe you lisinopril 10mg  for blood pressure - buy BP monitoring device and check you BP at home and record and see the trend of your BP - will prescribe low dose lipitor 10mg  daily for stroke prevention and wait for the lipid panel to adjust the medication dose. - regular exercise - healthy diet, low salt diet - follow up in 3 months.

## 2015-01-05 LAB — COMPREHENSIVE METABOLIC PANEL
ALK PHOS: 79 IU/L (ref 39–117)
ALT: 13 IU/L (ref 0–44)
AST: 12 IU/L (ref 0–40)
Albumin/Globulin Ratio: 1.7 (ref 1.1–2.5)
Albumin: 4.3 g/dL (ref 3.6–4.8)
BUN / CREAT RATIO: 10 (ref 10–22)
BUN: 9 mg/dL (ref 8–27)
CHLORIDE: 102 mmol/L (ref 97–108)
CO2: 24 mmol/L (ref 18–29)
Calcium: 9 mg/dL (ref 8.6–10.2)
Creatinine, Ser: 0.92 mg/dL (ref 0.76–1.27)
GFR calc Af Amer: 104 mL/min/{1.73_m2} (ref >59)
GFR calc non Af Amer: 90 mL/min/{1.73_m2} (ref >59)
GLOBULIN, TOTAL: 2.5 g/dL (ref 1.5–4.5)
Glucose: 96 mg/dL (ref 65–99)
Potassium: 4 mmol/L (ref 3.5–5.2)
SODIUM: 141 mmol/L (ref 134–144)
TOTAL PROTEIN: 6.8 g/dL (ref 6.0–8.5)
Total Bilirubin: 0.7 mg/dL (ref 0.0–1.2)

## 2015-01-05 LAB — LIPID PANEL
CHOLESTEROL TOTAL: 216 mg/dL — AB (ref 100–199)
Chol/HDL Ratio: 5.1 ratio units — ABNORMAL HIGH (ref 0.0–5.0)
HDL: 42 mg/dL (ref >39)
LDL Calculated: 143 mg/dL — ABNORMAL HIGH (ref 0–99)
Triglycerides: 156 mg/dL — ABNORMAL HIGH (ref 0–149)
VLDL CHOLESTEROL CAL: 31 mg/dL (ref 5–40)

## 2015-01-05 LAB — HEMOGLOBIN A1C
Est. average glucose Bld gHb Est-mCnc: 148 mg/dL
Hgb A1c MFr Bld: 6.8 % — ABNORMAL HIGH (ref 4.8–5.6)

## 2018-01-19 ENCOUNTER — Emergency Department (HOSPITAL_COMMUNITY): Payer: 59

## 2018-01-19 ENCOUNTER — Emergency Department (HOSPITAL_COMMUNITY)
Admission: EM | Admit: 2018-01-19 | Discharge: 2018-01-19 | Disposition: A | Discharge status: Home / Self Care | Payer: 59 | Attending: Emergency Medicine | Admitting: Emergency Medicine

## 2018-01-19 ENCOUNTER — Other Ambulatory Visit: Payer: Self-pay

## 2018-01-19 ENCOUNTER — Encounter (HOSPITAL_COMMUNITY): Payer: Self-pay

## 2018-01-19 DIAGNOSIS — I10 Essential (primary) hypertension: Secondary | ICD-10-CM | POA: Insufficient documentation

## 2018-01-19 DIAGNOSIS — I63331 Cerebral infarction due to thrombosis of right posterior cerebral artery: Secondary | ICD-10-CM

## 2018-01-19 DIAGNOSIS — M79632 Pain in left forearm: Secondary | ICD-10-CM | POA: Diagnosis present

## 2018-01-19 DIAGNOSIS — I160 Hypertensive urgency: Secondary | ICD-10-CM

## 2018-01-19 DIAGNOSIS — E119 Type 2 diabetes mellitus without complications: Secondary | ICD-10-CM | POA: Insufficient documentation

## 2018-01-19 DIAGNOSIS — G5602 Carpal tunnel syndrome, left upper limb: Secondary | ICD-10-CM | POA: Insufficient documentation

## 2018-01-19 LAB — BASIC METABOLIC PANEL
Anion gap: 13 (ref 5–15)
BUN: 9 mg/dL (ref 6–20)
CALCIUM: 8.7 mg/dL — AB (ref 8.9–10.3)
CO2: 23 mmol/L (ref 22–32)
Chloride: 102 mmol/L (ref 101–111)
Creatinine, Ser: 1.1 mg/dL (ref 0.61–1.24)
GFR calc Af Amer: >60 mL/min (ref >60)
Glucose, Bld: 277 mg/dL — ABNORMAL HIGH (ref 65–99)
Potassium: 3.4 mmol/L — ABNORMAL LOW (ref 3.5–5.1)
SODIUM: 138 mmol/L (ref 135–145)

## 2018-01-19 LAB — CBC
HCT: 41.3 % (ref 39.0–52.0)
Hemoglobin: 13.7 g/dL (ref 13.0–17.0)
MCH: 27.3 pg (ref 26.0–34.0)
MCHC: 33.2 g/dL (ref 30.0–36.0)
MCV: 82.4 fL (ref 78.0–100.0)
Platelets: 226 10*3/uL (ref 150–400)
RBC: 5.01 MIL/uL (ref 4.22–5.81)
RDW: 13 % (ref 11.5–15.5)
WBC: 5.5 10*3/uL (ref 4.0–10.5)

## 2018-01-19 LAB — I-STAT TROPONIN, ED
TROPONIN I, POC: 0.01 ng/mL (ref 0.00–0.08)
TROPONIN I, POC: 0.02 ng/mL (ref 0.00–0.08)

## 2018-01-19 MED ORDER — ATORVASTATIN CALCIUM 10 MG PO TABS: 10.0000 mg | ORAL_TABLET | Freq: Every day | ORAL | 2 refills | Status: DC

## 2018-01-19 MED ORDER — LISINOPRIL 10 MG PO TABS: 10.0000 mg | ORAL_TABLET | Freq: Every day | ORAL | 2 refills | Status: DC

## 2018-01-19 NOTE — ED Notes (Signed)
Called name to recheck vitals, no response from pt.

## 2018-01-19 NOTE — Discharge Instructions (Addendum)
As discussed, wear your wrist guard at night and whenever doing repetitive activity.  Folow up with the wellness center to establish care with a primary care provider and have a blood pressure recheck as you may need to be on blood pressure medications. Important to have primary care for preventative care and monitoring of blood pressure and blood sugar to help prevent any complications from untreated disease.  Return if symptoms worsen, you experience chest pain, shortness of breath, weakness, or other new concerning symptoms in the meantime.

## 2018-01-19 NOTE — ED Triage Notes (Signed)
Pt presents to the ed with complaints of numbness and tingling in left arm x 1 week. Pt denies any chest pressure or shortness of breath. In no apparent distress in triage.

## 2018-01-19 NOTE — ED Provider Notes (Signed)
[facility] EMERGENCY DEPARTMENT Provider Note   CSN: [id number] Arrival date & time: 01/19/18  1412     History   Chief Complaint Chief Complaint  Patient presents with  . Chest Pain    HPI Benjamin Cohen is a 63 y.o. male with past medical history significant for hyperlipidemia, diabetes, hypertension, stroke presenting with intermittent left forearm stinging pain, worse at night and mild left shoulder pain with movement only. Patient denies any chest pain, sob, numbness weakness.  No edema, erythema, fever, chills or other symptoms.    HPI  Past Medical History:  Diagnosis Date  . Hypertension   . Stroke [facility])     Patient Active Problem List   Diagnosis Date Noted  . HLD (hyperlipidemia) 01/04/2015  . Diabetes (HCC) 11/14/2013  . Other and unspecified hyperlipidemia 11/14/2013  . Left caudate nucleus, basal ganglia and corona radiata infarct due to small vessel disease 11/12/2013  . Hypertensive urgency 11/12/2013    Past Surgical History:  Procedure Laterality Date  . NO PAST SURGERIES         Home Medications    Prior to Admission medications   Medication Sig Start Date End Date Taking? Authorizing Provider  aspirin EC 325 MG EC tablet Take 1 tablet (325 mg total) by mouth daily. Patient not taking: Reported on 01/19/2018 11/17/13   Layne Benton, NP    Family History Family History  Problem Relation Age of Onset  . Cancer Mother   . Hypertension Brother     Social History Social History   Tobacco Use  . Smoking status: Never Smoker  . Smokeless tobacco: Never Used  Substance Use Topics  . Alcohol use: No    Alcohol/week: 0.0 oz  . Drug use: No     Allergies   Patient has no known allergies.   Review of Systems Review of Systems  Constitutional: Negative for chills and fever.  Eyes: Negative for pain and visual disturbance.  Respiratory: Negative for cough, choking, chest tightness, shortness of breath, wheezing and  stridor.   Cardiovascular: Negative for chest pain and palpitations.  Gastrointestinal: Negative for abdominal pain, nausea and vomiting.  Musculoskeletal: Positive for arthralgias and myalgias. Negative for back pain, gait problem, joint swelling, neck pain and neck stiffness.       Left shoulder pain intermittently and with movement only  Skin: Negative for color change, pallor, rash and wound.  Neurological: Negative for dizziness, seizures, syncope, facial asymmetry, weakness, light-headedness, numbness and headaches.       Stinging pain along the anterior forearm and 1st, 2nd and third finger mainly at night time.     Physical Exam Updated Vital Signs BP (!) 173/106 (BP Location: Left Arm)   Pulse 77   Temp 98.1 F (36.7 C) (Oral)   Resp 18   Wt 94.8 kg (209 lb)   SpO2 100%   BMI 32.73 kg/m   Physical Exam  Constitutional: He appears well-developed and well-nourished.  Non-toxic appearance. He does not appear ill. No distress.  Well-appearing, nontoxic afebrile sitting comfortably in bed no acute distress.  HENT:  Head: Normocephalic and atraumatic.  Eyes: Conjunctivae and EOM are normal.  Neck: Normal range of motion. No JVD present.  Cardiovascular: Normal rate, regular rhythm and normal pulses.  No murmur heard. Pulmonary/Chest: Effort normal and breath sounds normal. No accessory muscle usage or stridor. No respiratory distress.  Abdominal: He exhibits no distension.  Musculoskeletal: Normal range of motion. He exhibits no edema.  Right lower leg: Normal. He exhibits no edema.       Left lower leg: Normal. He exhibits no edema.  No tenderness palpation of the left shoulder or left arm.  No edema or erythema or warmth. Full range of motion. Negative Phalen's and Tinel Negative Hawkins and empty can test  Neurological: He is alert.  Skin: Skin is warm and dry. No ecchymosis and no rash noted. He is not diaphoretic. No erythema. No pallor.  Psychiatric: He has a  normal mood and affect.  Nursing note and vitals reviewed.    ED Treatments / Results  Labs (all labs ordered are listed, but only abnormal results are displayed) Labs Reviewed  BASIC METABOLIC PANEL - Abnormal; Notable for the following components:      Result Value   Potassium 3.4 (*)    Glucose, Bld 277 (*)    Calcium 8.7 (*)    All other components within normal limits  CBC  I-STAT TROPONIN, ED  I-STAT TROPONIN, ED  I-STAT TROPONIN, ED    EKG  EKG Interpretation  Date/Time:  Saturday January 19 2018 18:31:01 EST Ventricular Rate:  79 PR Interval:  154 QRS Duration: 88 QT Interval:  366 QTC Calculation: 420 R Axis:   -40 Text Interpretation:  Sinus rhythm Ventricular premature complex Left anterior fascicular block Abnormal R-wave progression, late transition Left ventricular hypertrophy Borderline T abnormalities, diffuse leads Confirmed by Tilden Fossaees, Elizabeth [phone]([id number]) on 01/19/2018 7:12:20 PM       Radiology Dg Chest 2 View  Result Date: 01/19/2018 CLINICAL DATA:  Hypertension with left upper extremity region pain EXAM: CHEST  2 VIEW COMPARISON:  None. FINDINGS: Lungs are clear. Heart size and pulmonary vascularity are normal. No adenopathy. No pneumothorax. There is degenerative change in the midthoracic spine. IMPRESSION: No edema or consolidation. Electronically Signed   By: Bretta BangWilliam  Woodruff III M.D.   On: 01/19/2018 15:09    Procedures Procedures (including critical care time)  Medications Ordered in ED Medications - No data to display   Initial Impression / Assessment and Plan / ED Course  I have reviewed the triage vital signs and the nursing notes.  Pertinent labs & imaging results that were available during my care of the patient were reviewed by me and considered in my medical decision making (see chart for details).    Patient presenting with 2 weeks of intermittent left anterior forearm "stinging" pain which is worse at nighttime and moves into his  first second and third digits. He is currently asymptomatic  Also reports left shoulder pain intermittently with movement only. Denies any chest pain, shortness of breath, fever, chills, erythema, swelling, nausea, vomiting or other symptoms.  Negative Phalen and Tinel, negative Hawkins and empty can test. No tenderness to palpation of the shoulder or arm.   Labs with elevated blood glucose otherwise unremarkable.  Negative chest x-ray  EKG with nonspecific ST changes and PVCs No old tracing to compare. Initial troponin negative.  Patient denies having any chest pain or shortness of breath but has risk factors including hypertension, noncompliant with medication, hyperlipidemia noncompliant with medications and diabetes.  Will repeat troponin and EKG to ensure no significant changes.  Negative delta troponin and repeat EKG is unchanged.  Discussed with patient elevated blood pressure and encourage follow-up with primary care for monitoring and management of any chronic underlying conditions. Patient had not been taking any medications in 3 years. Advised patient to have recheck prior to reinitiating bp medications. Discharge home with symptomatic  relief and close follow-up with PCP.  Discussed strict return precautions and advised patient to return with any worsening or new concerning symptoms.  Instructions were understood and patient agreed with discharge plan.  Final Clinical Impressions(s) / ED Diagnoses   Final diagnoses:  Carpal tunnel syndrome on left  Left forearm pain    ED Discharge Orders        Ordered    lisinopril (PRINIVIL,ZESTRIL) 10 MG tablet  Daily,   Status:  Discontinued     01/19/18 1814    atorvastatin (LIPITOR) 10 MG tablet  Daily,   Status:  Discontinued     01/19/18 1814       Georgiana Shore, PA-C 01/19/18 1918    Tilden Fossa, MD 01/20/18 [phone]

## 2018-04-29 ENCOUNTER — Inpatient Hospital Stay (HOSPITAL_COMMUNITY)
Admission: EM | Admit: 2018-04-29 | Discharge: 2018-05-02 | DRG: 065 | Disposition: A | Discharge status: Home / Self Care | Payer: 59 | Attending: Internal Medicine | Admitting: Internal Medicine

## 2018-04-29 ENCOUNTER — Other Ambulatory Visit: Payer: Self-pay

## 2018-04-29 ENCOUNTER — Encounter (HOSPITAL_COMMUNITY): Payer: Self-pay | Admitting: *Deleted

## 2018-04-29 ENCOUNTER — Emergency Department (HOSPITAL_COMMUNITY): Payer: 59

## 2018-04-29 DIAGNOSIS — E1159 Type 2 diabetes mellitus with other circulatory complications: Secondary | ICD-10-CM

## 2018-04-29 DIAGNOSIS — I110 Hypertensive heart disease with heart failure: Secondary | ICD-10-CM | POA: Diagnosis present

## 2018-04-29 DIAGNOSIS — Z9114 Patient's other noncompliance with medication regimen: Secondary | ICD-10-CM

## 2018-04-29 DIAGNOSIS — I160 Hypertensive urgency: Secondary | ICD-10-CM | POA: Diagnosis present

## 2018-04-29 DIAGNOSIS — R531 Weakness: Secondary | ICD-10-CM | POA: Diagnosis not present

## 2018-04-29 DIAGNOSIS — E11638 Type 2 diabetes mellitus with other oral complications: Secondary | ICD-10-CM | POA: Diagnosis not present

## 2018-04-29 DIAGNOSIS — Z8249 Family history of ischemic heart disease and other diseases of the circulatory system: Secondary | ICD-10-CM

## 2018-04-29 DIAGNOSIS — I69354 Hemiplegia and hemiparesis following cerebral infarction affecting left non-dominant side: Secondary | ICD-10-CM

## 2018-04-29 DIAGNOSIS — R402362 Coma scale, best motor response, obeys commands, at arrival to emergency department: Secondary | ICD-10-CM | POA: Diagnosis present

## 2018-04-29 DIAGNOSIS — I5022 Chronic systolic (congestive) heart failure: Secondary | ICD-10-CM | POA: Diagnosis present

## 2018-04-29 DIAGNOSIS — I6330 Cerebral infarction due to thrombosis of unspecified cerebral artery: Secondary | ICD-10-CM | POA: Diagnosis not present

## 2018-04-29 DIAGNOSIS — R29703 NIHSS score 3: Secondary | ICD-10-CM | POA: Diagnosis present

## 2018-04-29 DIAGNOSIS — R402252 Coma scale, best verbal response, oriented, at arrival to emergency department: Secondary | ICD-10-CM | POA: Diagnosis present

## 2018-04-29 DIAGNOSIS — E1151 Type 2 diabetes mellitus with diabetic peripheral angiopathy without gangrene: Secondary | ICD-10-CM | POA: Diagnosis present

## 2018-04-29 DIAGNOSIS — E1165 Type 2 diabetes mellitus with hyperglycemia: Secondary | ICD-10-CM | POA: Diagnosis present

## 2018-04-29 DIAGNOSIS — Z91199 Patient's noncompliance with other medical treatment and regimen due to unspecified reason: Secondary | ICD-10-CM

## 2018-04-29 DIAGNOSIS — R29898 Other symptoms and signs involving the musculoskeletal system: Secondary | ICD-10-CM | POA: Diagnosis not present

## 2018-04-29 DIAGNOSIS — R270 Ataxia, unspecified: Secondary | ICD-10-CM | POA: Diagnosis present

## 2018-04-29 DIAGNOSIS — Z9119 Patient's noncompliance with other medical treatment and regimen: Secondary | ICD-10-CM

## 2018-04-29 DIAGNOSIS — E785 Hyperlipidemia, unspecified: Secondary | ICD-10-CM | POA: Diagnosis present

## 2018-04-29 DIAGNOSIS — Z8673 Personal history of transient ischemic attack (TIA), and cerebral infarction without residual deficits: Secondary | ICD-10-CM | POA: Diagnosis not present

## 2018-04-29 DIAGNOSIS — E119 Type 2 diabetes mellitus without complications: Secondary | ICD-10-CM

## 2018-04-29 DIAGNOSIS — I63311 Cerebral infarction due to thrombosis of right middle cerebral artery: Secondary | ICD-10-CM | POA: Diagnosis not present

## 2018-04-29 DIAGNOSIS — I63319 Cerebral infarction due to thrombosis of unspecified middle cerebral artery: Secondary | ICD-10-CM | POA: Diagnosis not present

## 2018-04-29 DIAGNOSIS — E782 Mixed hyperlipidemia: Secondary | ICD-10-CM | POA: Diagnosis not present

## 2018-04-29 DIAGNOSIS — R402142 Coma scale, eyes open, spontaneous, at arrival to emergency department: Secondary | ICD-10-CM | POA: Diagnosis present

## 2018-04-29 DIAGNOSIS — I6340 Cerebral infarction due to embolism of unspecified cerebral artery: Principal | ICD-10-CM | POA: Diagnosis present

## 2018-04-29 DIAGNOSIS — I639 Cerebral infarction, unspecified: Secondary | ICD-10-CM | POA: Diagnosis present

## 2018-04-29 DIAGNOSIS — I361 Nonrheumatic tricuspid (valve) insufficiency: Secondary | ICD-10-CM | POA: Diagnosis not present

## 2018-04-29 LAB — COMPREHENSIVE METABOLIC PANEL
ALBUMIN: 3.7 g/dL (ref 3.5–5.0)
ALT: 16 U/L — ABNORMAL LOW (ref 17–63)
ANION GAP: 12 (ref 5–15)
AST: 19 U/L (ref 15–41)
Alkaline Phosphatase: 100 U/L (ref 38–126)
BILIRUBIN TOTAL: 0.7 mg/dL (ref 0.3–1.2)
BUN: 8 mg/dL (ref 6–20)
CALCIUM: 8.7 mg/dL — AB (ref 8.9–10.3)
CO2: 23 mmol/L (ref 22–32)
CREATININE: 1.11 mg/dL (ref 0.61–1.24)
Chloride: 103 mmol/L (ref 101–111)
GFR calc Af Amer: >60 mL/min (ref >60)
GLUCOSE: 351 mg/dL — AB (ref 65–99)
POTASSIUM: 3.6 mmol/L (ref 3.5–5.1)
Sodium: 138 mmol/L (ref 135–145)
Total Protein: 7 g/dL (ref 6.5–8.1)

## 2018-04-29 LAB — APTT: aPTT: 31 seconds (ref 24–36)

## 2018-04-29 LAB — I-STAT CHEM 8, ED
BUN: 11 mg/dL (ref 6–20)
CHLORIDE: 102 mmol/L (ref 101–111)
CREATININE: 0.9 mg/dL (ref 0.61–1.24)
Calcium, Ion: 1.11 mmol/L — ABNORMAL LOW (ref 1.15–1.40)
Glucose, Bld: 359 mg/dL — ABNORMAL HIGH (ref 65–99)
HEMATOCRIT: 44 % (ref 39.0–52.0)
HEMOGLOBIN: 15 g/dL (ref 13.0–17.0)
POTASSIUM: 3.6 mmol/L (ref 3.5–5.1)
Sodium: 139 mmol/L (ref 135–145)
TCO2: 22 mmol/L (ref 22–32)

## 2018-04-29 LAB — URINALYSIS, ROUTINE W REFLEX MICROSCOPIC
BILIRUBIN URINE: NEGATIVE
Bacteria, UA: NONE SEEN
HGB URINE DIPSTICK: NEGATIVE
KETONES UR: 5 mg/dL — AB
LEUKOCYTES UA: NEGATIVE
Nitrite: NEGATIVE
Protein, ur: NEGATIVE mg/dL
Specific Gravity, Urine: 1.036 — ABNORMAL HIGH (ref 1.005–1.030)
pH: 5 (ref 5.0–8.0)

## 2018-04-29 LAB — DIFFERENTIAL
Abs Immature Granulocytes: 0 10*3/uL (ref 0.0–0.1)
Basophils Absolute: 0.1 10*3/uL (ref 0.0–0.1)
Basophils Relative: 1 %
EOS ABS: 0.1 10*3/uL (ref 0.0–0.7)
EOS PCT: 1 %
IMMATURE GRANULOCYTES: 0 %
LYMPHS ABS: 1.8 10*3/uL (ref 0.7–4.0)
Lymphocytes Relative: 37 %
MONOS PCT: 7 %
Monocytes Absolute: 0.3 10*3/uL (ref 0.1–1.0)
NEUTROS PCT: 54 %
Neutro Abs: 2.6 10*3/uL (ref 1.7–7.7)

## 2018-04-29 LAB — CBC
HCT: 44.4 % (ref 39.0–52.0)
Hemoglobin: 14.2 g/dL (ref 13.0–17.0)
MCH: 26.5 pg (ref 26.0–34.0)
MCHC: 32 g/dL (ref 30.0–36.0)
MCV: 83 fL (ref 78.0–100.0)
PLATELETS: 221 10*3/uL (ref 150–400)
RBC: 5.35 MIL/uL (ref 4.22–5.81)
RDW: 12.7 % (ref 11.5–15.5)
WBC: 4.7 10*3/uL (ref 4.0–10.5)

## 2018-04-29 LAB — I-STAT TROPONIN, ED: Troponin i, poc: 0.03 ng/mL (ref 0.00–0.08)

## 2018-04-29 LAB — PROTIME-INR
INR: 0.94
PROTHROMBIN TIME: 12.5 s (ref 11.4–15.2)

## 2018-04-29 LAB — CBG MONITORING, ED: GLUCOSE-CAPILLARY: 269 mg/dL — AB (ref 65–99)

## 2018-04-29 MED ORDER — INSULIN GLARGINE 100 UNIT/ML ~~LOC~~ SOLN
10.0000 [IU] | Freq: Every day | SUBCUTANEOUS | Status: DC
  Administered 2018-04-30: 10 [IU] via SUBCUTANEOUS
  Filled 2018-04-29: qty 0.1

## 2018-04-29 MED ORDER — INSULIN ASPART 100 UNIT/ML ~~LOC~~ SOLN
0.0000 [IU] | Freq: Three times a day (TID) | SUBCUTANEOUS | Status: DC
  Administered 2018-04-30 (×2): 7 [IU] via SUBCUTANEOUS
  Administered 2018-04-30 – 2018-05-01 (×2): 4 [IU] via SUBCUTANEOUS

## 2018-04-29 NOTE — Consult Note (Signed)
Neurology Consultation  Reason for Consult: Left-sided weakness Referring Physician: Dr. Adriana Simas  CC: Left sided weakness  History is obtained from: Patient and chart  HPI: Benjamin Cohen is a 62 y.o. male with past medical history of hypertension listed on his chart but he denies, presented to the emergency room for evaluation of left-sided weakness that started on Friday, 04/26/2018.  He did not make much of it and it started to improve a bit and that is why he did not come to the hospital.  He came to the hospital today because his weakness was not completely resolved and was bothersome with his ADLs. He denies any preceding sicknesses.  Denies having had any chest pain, palpitations nausea vomiting. He does report he had a stroke 3 years ago that affected the left side of his body but did not leave him with any residual weakness. He has a documented history of hypertension, his blood glucose was 360 on arrival.  He later admitted to having had hypertension and diabetes and is not on any medications because of insurance reasons.  He does not have a doctor.  Does not take any medications. 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  He was given TPA at that time. Some luminal irregularities in the intracranial vessels.  Was recommended to be on aspirin.  Denies complaints.  LKW: At least 3 days ago tpa given?: no, outside the window Premorbid modified Rankin scale (mRS):0 ROS: ROS was performed and is negative except as noted in the HPI.  Past Medical History:  Diagnosis Date  . Hypertension   . Stroke [facility])    Family History  Problem Relation Age of Onset  . Cancer Mother   . Hypertension Brother    Social History:   reports that he has never smoked. He has never used smokeless tobacco. He reports that he does not drink alcohol or use drugs. Denies smoking, illicit drug use or alcohol abuse  Medications No current  facility-administered medications for this encounter.   Current Outpatient Medications:  .  aspirin EC 325 MG EC tablet, Take 1 tablet (325 mg total) by mouth daily. (Patient not taking: Reported on 01/19/2018), Disp: 30 tablet, Rfl: 0  Exam: Current vital signs: BP (!) 153/93   Pulse 71   Temp 98.2 F (36.8 C)   Resp 20   Ht  (1.702 m)   Wt 93.9 kg (207 lb)   SpO2 95%   BMI 32.42 kg/m  Vital signs in last 24 hours: Temp:  [98.2 F (36.8 C)] 98.2 F (36.8 C) (05/20 1957) Pulse Rate:  [71-90] 71 (05/20 2049) Resp:  [14-22] 20 (05/20 2049) BP: (107-161)/(82-120) 153/93 (05/20 2049) SpO2:  [94 %-100 %] 95 % (05/20 2049) Weight:  [93.9 kg (207 lb)] 93.9 kg (207 lb) (05/20 1659) GENERAL: Awake, alert in NAD HEENT: - Normocephalic and atraumatic, dry mm, no LN++, no Thyromegally LUNGS - Clear to auscultation bilaterally with no wheezes CV - S1S2 RRR, no m/r/g, equal pulses bilaterally. ABDOMEN - Soft, nontender, nondistended with normoactive BS Ext: warm, well perfused, intact peripheral pulses, no edema NEURO:  Mental Status: AA&Ox3  Language: speech is mildly dysarthric.  Naming, repetition, fluency, and comprehension intact. Cranial Nerves: PERRL. EOMI, visual fields full, no facial asymmetry, facial sensation intact, hearing intact, tongue/uvula/soft palate midline, normal sternocleidomastoid and trapezius muscle strength. No evidence of tongue atrophy or fibrillations Motor: Left upper extremity 4+/5, left  lower extremity 4/5 with vertical drift, right upper and lower extremities 5/5. Tone: is normal and bulk is normal Sensation- Intact to light touch bilaterally Coordination: Mild dysmetria on the left and finger-nose-finger Gait- deferred  NIHSS=3   Labs I have reviewed labs in epic and the results pertinent to this consultation are: CBC    Component Value Date/Time   WBC 4.7 04/29/2018 1732   RBC 5.35 04/29/2018 1732   HGB 15.0 04/29/2018 1755   HCT 44.0  04/29/2018 1755   PLT 221 04/29/2018 1732   MCV 83.0 04/29/2018 1732   MCH 26.5 04/29/2018 1732   MCHC 32.0 04/29/2018 1732   RDW 12.7 04/29/2018 1732   LYMPHSABS 1.8 04/29/2018 1732   MONOABS 0.3 04/29/2018 1732   EOSABS 0.1 04/29/2018 1732   BASOSABS 0.1 04/29/2018 1732    CMP     Component Value Date/Time   NA 139 04/29/2018 1755   NA 141 01/04/2015 1449   K 3.6 04/29/2018 1755   CL 102 04/29/2018 1755   CO2 23 04/29/2018 1732   GLUCOSE 359 (H) 04/29/2018 1755   BUN 11 04/29/2018 1755   BUN 9 01/04/2015 1449   CREATININE 0.90 04/29/2018 1755   CALCIUM 8.7 (L) 04/29/2018 1732   PROT 7.0 04/29/2018 1732   PROT 6.8 01/04/2015 1449   ALBUMIN 3.7 04/29/2018 1732   ALBUMIN 4.3 01/04/2015 1449   AST 19 04/29/2018 1732   ALT 16 (L) 04/29/2018 1732   ALKPHOS 100 04/29/2018 1732   BILITOT 0.7 04/29/2018 1732   GFRNONAA >60 04/29/2018 1732   GFRAA >60 04/29/2018 1732    Lipid Panel     Component Value Date/Time   CHOL 216 (H) 01/04/2015 1449   TRIG 156 (H) 01/04/2015 1449   HDL 42 01/04/2015 1449   CHOLHDL 5.1 (H) 01/04/2015 1449   CHOLHDL 4.8 11/12/2013 0700   VLDL 31 11/12/2013 0700   LDLCALC 143 (H) 01/04/2015 1449     Imaging I have reviewed the images obtained:  CT-scan of the brain-no acute changes.  Assessment:  62 year old man with a history of diabetes and hypertension who had small vessel stroke involving the right basal ganglia in 2016, for which she was treated with TPA, with no residual deficits presents again with left-sided weakness that started Friday. He does have some objective weakness on the left side on exam. Also has mild ataxia on the left side. I suspect there is either a another small vessel stroke on the right cerebral hemisphere or the left cerebellum.  Could also be a brainstem stroke.  I think he will benefit from an admission for stroke risk factor work-up.  Also will need regular outpatient follow-up in primary  care.  Impression: Evaluate for acute ischemic stroke-small vessel etiology in the basal ganglia versus cerebellum versus brainstem  Recommendations: -Admit to hospitalist -Telemetry monitoring -Allow for permissive hypertension for the first 24-48h - only treat PRN if SBP >220 mmHg. Blood pressures can be gradually normalized to SBP<140 upon discharge. -MRI brain without contrast -CT Angiogram of Head and neck -Echocardiogram -HgbA1c, fasting lipid panel -Frequent neuro checks -Prophylactic therapy-Antiplatelet med: Aspirin - dose -if this is a new stroke, cannot be considered as an aspirin failure because he is noncompliant to medications. -Atorvastatin 80 mg PO daily -Risk factor modification -Check urinary drug screen -PT consult, OT consult, Speech consult Please page stroke NP/PA/MD (listed on AMION)  from 8am-4 pm as this patient will be followed by the stroke team at this point.  --  Amie Portland, MD [facility] Pager: [phone] If 7pm to 7am, please call on call as listed on AMION.

## 2018-04-29 NOTE — H&P (Addendum)
History and Physical    Benjamin Cohen WGN:[id number] DOB: 1956-11-26 DOA: 04/29/2018  Referring MD/NP/PA: Cheron Schaumann, PA  PCP: Patient, No Pcp Per   Outpatient Specialists: None   Patient coming from: home  Chief Complaint: left-sided weakness  HPI: Benjamin Cohen is a 62 y.o. male with medical history significant of hypertension and diabetes who has not been taking any medications and not been following off with any physician. Patient had history of stroke in 2016 but has not been taking medications. Since Friday patient has been having left-sided weakness especially in the lower extremity but not extending to the upper extremity. He thought it would go away but did not. He came to the ER where he has so far been evaluated by neurology and recommends inpatient admission for possible new stroke. Head CT so far negative.   ED Course: patient's vitals are stable with a temperature 90.2 and blood pressure 142/94. His labs showed a blood sugar 359 otherwise all other lab work so far negative. Head CT showed no acute findings but possibly remote infarcts within the bilateral basal ganglia. Patient is therefore being admitted for stroke workup.  Review of Systems: As per HPI otherwise 10 point review of systems negative.    Past Medical History:  Diagnosis Date  . Hypertension   . Stroke [facility])     Past Surgical History:  Procedure Laterality Date  . NO PAST SURGERIES       reports that he has never smoked. He has never used smokeless tobacco. He reports that he does not drink alcohol or use drugs.  No Known Allergies  Family History  Problem Relation Age of Onset  . Cancer Mother   . Hypertension Brother      Prior to Admission medications   Medication Sig Start Date End Date Taking? Authorizing Provider  aspirin EC 325 MG EC tablet Take 1 tablet (325 mg total) by mouth daily. Patient not taking: Reported on 01/19/2018 11/17/13   Layne Benton, NP    Physical  Exam: Vitals:   04/29/18 1945 04/29/18 1957 04/29/18 2049 04/29/18 2200  BP: (!) 158/112  (!) 153/93 (!) 141/94  Pulse: 82  71 78  Resp: (!) 22  20 (!) 21  Temp:  98.2 F (36.8 C)    TempSrc:      SpO2: 96%  95% 95%  Weight:      Height:          Constitutional: NAD, calm, comfortable Vitals:   04/29/18 1945 04/29/18 1957 04/29/18 2049 04/29/18 2200  BP: (!) 158/112  (!) 153/93 (!) 141/94  Pulse: 82  71 78  Resp: (!) 22  20 (!) 21  Temp:  98.2 F (36.8 C)    TempSrc:      SpO2: 96%  95% 95%  Weight:      Height:       Eyes: PERRL, lids and conjunctivae normal ENMT: Mucous membranes are moist. Posterior pharynx clear of any exudate or lesions.Normal dentition.  Neck: normal, supple, no masses, no thyromegaly Respiratory: clear to auscultation bilaterally, no wheezing, no crackles. Normal respiratory effort. No accessory muscle use.  Cardiovascular: Regular rate and rhythm, no murmurs / rubs / gallops. No extremity edema. 2+ pedal pulses. No carotid bruits.  Abdomen: no tenderness, no masses palpated. No hepatosplenomegaly. Bowel sounds positive.  Musculoskeletal: no clubbing / cyanosis. No joint deformity upper and lower extremities. Good ROM, no contractures. Normal muscle tone.  Skin: no rashes, lesions, ulcers. No  induration Neurologic: CN 2-12 grossly intact. Sensation intact, DTR normal. Strength 4/5 left upper and lower extremities, poor coordination, reflexes 1+ Psychiatric: Normal judgment and insight. Alert and oriented x 3. Normal mood.   Labs on Admission: I have personally reviewed following labs and imaging studies  CBC: Recent Labs  Lab 04/29/18 1732 04/29/18 1755  WBC 4.7  --   NEUTROABS 2.6  --   HGB 14.2 15.0  HCT 44.4 44.0  MCV 83.0  --   PLT 221  --    Basic Metabolic Panel: Recent Labs  Lab 04/29/18 1732 04/29/18 1755  NA 138 139  K 3.6 3.6  CL 103 102  CO2 23  --   GLUCOSE 351* 359*  BUN 8 11  CREATININE 1.11 0.90  CALCIUM 8.7*  --     GFR: Estimated Creatinine Clearance: 90.5 mL/min (by C-G formula based on SCr of 0.9 mg/dL). Liver Function Tests: Recent Labs  Lab 04/29/18 1732  AST 19  ALT 16*  ALKPHOS 100  BILITOT 0.7  PROT 7.0  ALBUMIN 3.7   No results for input(s): LIPASE, AMYLASE in the last 168 hours. No results for input(s): AMMONIA in the last 168 hours. Coagulation Profile: Recent Labs  Lab 04/29/18 1732  INR 0.94   Cardiac Enzymes: No results for input(s): CKTOTAL, CKMB, CKMBINDEX, TROPONINI in the last 168 hours. BNP (last 3 results) No results for input(s): PROBNP in the last 8760 hours. HbA1C: No results for input(s): HGBA1C in the last 72 hours. CBG: Recent Labs  Lab 04/29/18 1955  GLUCAP 269*   Lipid Profile: No results for input(s): CHOL, HDL, LDLCALC, TRIG, CHOLHDL, LDLDIRECT in the last 72 hours. Thyroid Function Tests: No results for input(s): TSH, T4TOTAL, FREET4, T3FREE, THYROIDAB in the last 72 hours. Anemia Panel: No results for input(s): VITAMINB12, FOLATE, FERRITIN, TIBC, IRON, RETICCTPCT in the last 72 hours. Urine analysis:    Component Value Date/Time   COLORURINE YELLOW 04/29/2018 1822   APPEARANCEUR CLEAR 04/29/2018 1822   LABSPEC 1.036 (H) 04/29/2018 1822   PHURINE 5.0 04/29/2018 1822   GLUCOSEU >=500 (A) 04/29/2018 1822   HGBUR NEGATIVE 04/29/2018 1822   BILIRUBINUR NEGATIVE 04/29/2018 1822   KETONESUR 5 (A) 04/29/2018 1822   PROTEINUR NEGATIVE 04/29/2018 1822   UROBILINOGEN 0.2 11/12/2013 0407   NITRITE NEGATIVE 04/29/2018 1822   LEUKOCYTESUR NEGATIVE 04/29/2018 1822   Sepsis Labs: (procalcitonin:4,lacticidven:4) )No results found for this or any previous visit (from the past 240 hour(s)).   Radiological Exams on Admission: Ct Head Wo Contrast  Result Date: 04/29/2018 CLINICAL DATA:  Left upper and lower extremity weakness for 2 days EXAM: CT HEAD WITHOUT CONTRAST TECHNIQUE: Contiguous axial images were obtained from the base of the skull  through the vertex without intravenous contrast. COMPARISON:  None. FINDINGS: Brain: No acute territorial infarction, hemorrhage or intracranial mass. Mild to moderate small vessel ischemic changes of the white matter. Probable remote infarcts in the bilateral basal ganglia. Nonenlarged ventricles Vascular: No hyperdense vessels. Scattered calcifications at the carotid siphons. Skull: Normal. Negative for fracture or focal lesion. Sinuses/Orbits: No acute finding. Other: None IMPRESSION: 1. No definite CT evidence for acute intracranial abnormality. 2. Probable remote infarcts within the bilateral basal ganglia. Moderate small vessel ischemic changes of the white matter. Electronically Signed   By: Jasmine Pang M.D.   On: 04/29/2018 18:45    EKG: Independently reviewed. Normal sinus rhythm with left axis deviation no ST changes  Assessment/Plan Principal Problem:   Cerebral infarction [facility]) Active  Problems:   Hypertensive urgency   Diabetes (HCC)   HLD (hyperlipidemia)   Cerebral thrombosis with cerebral infarction   CVA (cerebral vascular accident) (HCC)   #1 left-sided hemiparesis: Patient has a power of 4 out of 5 in the left upper and lower axial images respectively. He has poor coordination finger-nose. He will be admitted for CVA workup. Get MRI of the brain and MRA. Get carotid Dopplers. Fasting lipid panel as well as B12 and TSH. Neurology consulted we will follow recommendation. Aspirin and statin will be initiated. Physical therapy and occupational therapy also.  #2 hypertension: Blood pressure well controlled. Pelvis of hypertension most likely.  #3 hyperlipidemia: Initiate statin and continue management and follow-up.  #4 diabetes: patient has not been taking any medications. We'll get hemoglobin A1c. Sliding scale insulin will also be initiated.   DVT prophylaxis: Lovenox  Code Status: Full Family Communication: None available  Disposition Plan: Home   Consults called: Dr  Jerrell Belfast, Neurology  Admission status: inpatient  Severity of Illness: The appropriate patient status for this patient is INPATIENT. Inpatient status is judged to be reasonable and necessary in order to provide the required intensity of service to ensure the patient's safety. The patient's presenting symptoms, physical exam findings, and initial radiographic and laboratory data in the context of their chronic comorbidities is felt to place them at high risk for further clinical deterioration. Furthermore, it is not anticipated that the patient will be medically stable for discharge from the hospital within 2 midnights of admission. The following factors support the patient status of inpatient.   " The patient's presenting symptoms include left-sided weakness. " The worrisome physical exam findings include left hemiparesis. " The initial radiographic and laboratory data are worrisome because of negative head CT. " The chronic co-morbidities include history of hypertension diabetes and previous stroke.   * I certify that at the point of admission it is my clinical judgment that the patient will require inpatient hospital care spanning beyond 2 midnights from the point of admission due to high intensity of service, high risk for further deterioration and high frequency of surveillance required.Lonia Blood MD [facility] Pager [phone]  If 7PM-7AM, please contact night-coverage [email or web address] Password [facility]  04/29/2018, 10:55 PM

## 2018-04-29 NOTE — ED Provider Notes (Signed)
[facility] EMERGENCY DEPARTMENT Provider Note   CSN: [id number] Arrival date & time: 04/29/18  1529     History   Chief Complaint Chief Complaint  Patient presents with  . Weakness    HPI Benjamin Cohen is a 62 y.o. male.  The history is provided by the patient. No language interpreter was used.  Weakness  Primary symptoms include focal weakness. This is a new problem. The current episode started 2 days ago. The problem has been gradually improving. There was left lower extremity and left upper extremity focality noted. There has been no fever. Pertinent negatives include no shortness of breath, no chest pain and no altered mental status. There were no medications administered prior to arrival. Associated medical issues include CVA.  Pt reports he had a stroke 3 years ago on left side but all symptoms resolved.  Pt reports he began feeling weak 2 days ago.  Pt reports left arm does not move like he wants it to.  Pt reports left leg is weak but is some better than it was 2 days ago.  Pt has a history of Hypertension.  He is not taking any medications.  He does not currently have a doctor.   Past Medical History:  Diagnosis Date  . Hypertension   . Stroke [facility])     Patient Active Problem List   Diagnosis Date Noted  . HLD (hyperlipidemia) 01/04/2015  . Diabetes (HCC) 11/14/2013  . Other and unspecified hyperlipidemia 11/14/2013  . Left caudate nucleus, basal ganglia and corona radiata infarct due to small vessel disease 11/12/2013  . Hypertensive urgency 11/12/2013    Past Surgical History:  Procedure Laterality Date  . NO PAST SURGERIES          Home Medications    Prior to Admission medications   Medication Sig Start Date End Date Taking? Authorizing Provider  aspirin EC 325 MG EC tablet Take 1 tablet (325 mg total) by mouth daily. Patient not taking: Reported on 01/19/2018 11/17/13   Layne Benton, NP    Family History Family History  Problem  Relation Age of Onset  . Cancer Mother   . Hypertension Brother     Social History Social History   Tobacco Use  . Smoking status: Never Smoker  . Smokeless tobacco: Never Used  Substance Use Topics  . Alcohol use: No    Alcohol/week: 0.0 oz  . Drug use: No     Allergies   Patient has no known allergies.   Review of Systems Review of Systems  Respiratory: Negative for shortness of breath.   Cardiovascular: Negative for chest pain.  Neurological: Positive for focal weakness and weakness.  All other systems reviewed and are negative.    Physical Exam Updated Vital Signs BP (!) 158/112   Pulse 82   Temp 98.2 F (36.8 C)   Resp (!) 22   Ht  (1.702 m)   Wt 93.9 kg (207 lb)   SpO2 96%   BMI 32.42 kg/m   Physical Exam  Constitutional: He appears well-developed and well-nourished.  HENT:  Head: Normocephalic and atraumatic.  Eyes: Conjunctivae are normal.  Neck: Neck supple.  Cardiovascular: Normal rate and regular rhythm.  No murmur heard. Pulmonary/Chest: Effort normal and breath sounds normal. No respiratory distress.  Abdominal: Soft. There is no tenderness.  Musculoskeletal: He exhibits no edema.  Weak grip left hand,  Decreased ability to lift arm, can not reach with arm.  From bilat lower  extremities,  Weakness left leg,   Neurological: He is alert.  Skin: Skin is warm and dry.  Psychiatric: He has a normal mood and affect.  Nursing note and vitals reviewed.    ED Treatments / Results  Labs (all labs ordered are listed, but only abnormal results are displayed) Labs Reviewed  COMPREHENSIVE METABOLIC PANEL - Abnormal; Notable for the following components:      Result Value   Glucose, Bld 351 (*)    Calcium 8.7 (*)    ALT 16 (*)    All other components within normal limits  URINALYSIS, ROUTINE W REFLEX MICROSCOPIC - Abnormal; Notable for the following components:   Specific Gravity, Urine 1.036 (*)    Glucose, UA >=500 (*)    Ketones, ur 5  (*)    All other components within normal limits  CBG MONITORING, ED - Abnormal; Notable for the following components:   Glucose-Capillary 269 (*)    All other components within normal limits  I-STAT CHEM 8, ED - Abnormal; Notable for the following components:   Glucose, Bld 359 (*)    Calcium, Ion 1.11 (*)    All other components within normal limits  PROTIME-INR  APTT  CBC  DIFFERENTIAL  I-STAT TROPONIN, ED    EKG None  Radiology Ct Head Wo Contrast  Result Date: 04/29/2018 CLINICAL DATA:  Left upper and lower extremity weakness for 2 days EXAM: CT HEAD WITHOUT CONTRAST TECHNIQUE: Contiguous axial images were obtained from the base of the skull through the vertex without intravenous contrast. COMPARISON:  None. FINDINGS: Brain: No acute territorial infarction, hemorrhage or intracranial mass. Mild to moderate small vessel ischemic changes of the white matter. Probable remote infarcts in the bilateral basal ganglia. Nonenlarged ventricles Vascular: No hyperdense vessels. Scattered calcifications at the carotid siphons. Skull: Normal. Negative for fracture or focal lesion. Sinuses/Orbits: No acute finding. Other: None IMPRESSION: 1. No definite CT evidence for acute intracranial abnormality. 2. Probable remote infarcts within the bilateral basal ganglia. Moderate small vessel ischemic changes of the white matter. Electronically Signed   By: Jasmine Pang M.D.   On: 04/29/2018 18:45    Procedures Procedures (including critical care time)  Medications Ordered in ED Medications - No data to display   Initial Impression / Assessment and Plan / ED Course  I have reviewed the triage vital signs and the nursing notes.  Pertinent labs & imaging results that were available during my care of the patient were reviewed by me and considered in my medical decision making (see chart for details).   Pt symptoms consistent with cva.  Pt last normal over 2 days ago.  Neurology consulted.  Dr.  Wilford Corner consulted.  He advised admit to hospitalist.  Mri ordered.    I spoke with Dr. Mikeal Hawthorne Hospitalist who will admit  Final Clinical Impressions(s) / ED Diagnoses   Final diagnoses:  Weakness  Cerebrovascular accident (CVA), unspecified mechanism [facility])    ED Discharge Orders    None       Osie Cheeks 04/29/18 2249    Donnetta Hutching, MD 05/02/18 [phone]

## 2018-04-29 NOTE — ED Triage Notes (Signed)
Pt reports having left side weakness x 2 days. Left grip is weaker, mild arm drift noted. Has left lower back pain, denies any injury. Is able to ambulate but states left leg feels weak.

## 2018-04-29 NOTE — ED Notes (Signed)
Report to RN Hermenia Bers

## 2018-04-30 ENCOUNTER — Inpatient Hospital Stay (HOSPITAL_COMMUNITY): Payer: 59

## 2018-04-30 DIAGNOSIS — E1159 Type 2 diabetes mellitus with other circulatory complications: Secondary | ICD-10-CM

## 2018-04-30 DIAGNOSIS — I361 Nonrheumatic tricuspid (valve) insufficiency: Secondary | ICD-10-CM

## 2018-04-30 DIAGNOSIS — R29898 Other symptoms and signs involving the musculoskeletal system: Secondary | ICD-10-CM

## 2018-04-30 DIAGNOSIS — I160 Hypertensive urgency: Secondary | ICD-10-CM

## 2018-04-30 DIAGNOSIS — Z91199 Patient's noncompliance with other medical treatment and regimen due to unspecified reason: Secondary | ICD-10-CM

## 2018-04-30 DIAGNOSIS — E11638 Type 2 diabetes mellitus with other oral complications: Secondary | ICD-10-CM

## 2018-04-30 DIAGNOSIS — I63319 Cerebral infarction due to thrombosis of unspecified middle cerebral artery: Secondary | ICD-10-CM

## 2018-04-30 DIAGNOSIS — Z8673 Personal history of transient ischemic attack (TIA), and cerebral infarction without residual deficits: Secondary | ICD-10-CM

## 2018-04-30 DIAGNOSIS — Z9119 Patient's noncompliance with other medical treatment and regimen: Secondary | ICD-10-CM

## 2018-04-30 LAB — GLUCOSE, CAPILLARY
GLUCOSE-CAPILLARY: 245 mg/dL — AB (ref 65–99)
GLUCOSE-CAPILLARY: 272 mg/dL — AB (ref 65–99)
Glucose-Capillary: 240 mg/dL — ABNORMAL HIGH (ref 65–99)
Glucose-Capillary: 232 mg/dL — ABNORMAL HIGH (ref 65–99)
Glucose-Capillary: 196 mg/dL — ABNORMAL HIGH (ref 65–99)

## 2018-04-30 LAB — CREATININE, SERUM
Creatinine, Ser: 1.09 mg/dL (ref 0.61–1.24)
GFR calc Af Amer: >60 mL/min (ref >60)

## 2018-04-30 LAB — RAPID URINE DRUG SCREEN, HOSP PERFORMED
AMPHETAMINES: NOT DETECTED
Barbiturates: NOT DETECTED
Benzodiazepines: NOT DETECTED
Cocaine: NOT DETECTED
OPIATES: NOT DETECTED
TETRAHYDROCANNABINOL: NOT DETECTED

## 2018-04-30 LAB — COMPREHENSIVE METABOLIC PANEL
ALBUMIN: 3.4 g/dL — AB (ref 3.5–5.0)
ALT: 14 U/L — AB (ref 17–63)
AST: 15 U/L (ref 15–41)
Alkaline Phosphatase: 89 U/L (ref 38–126)
Anion gap: 9 (ref 5–15)
BUN: 9 mg/dL (ref 6–20)
CHLORIDE: 105 mmol/L (ref 101–111)
CO2: 26 mmol/L (ref 22–32)
CREATININE: 1.07 mg/dL (ref 0.61–1.24)
Calcium: 8.6 mg/dL — ABNORMAL LOW (ref 8.9–10.3)
GFR calc Af Amer: >60 mL/min (ref >60)
GFR calc non Af Amer: >60 mL/min (ref >60)
GLUCOSE: 332 mg/dL — AB (ref 65–99)
POTASSIUM: 3.3 mmol/L — AB (ref 3.5–5.1)
Sodium: 140 mmol/L (ref 135–145)
Total Bilirubin: 1.1 mg/dL (ref 0.3–1.2)
Total Protein: 6.6 g/dL (ref 6.5–8.1)

## 2018-04-30 LAB — CBC
HEMATOCRIT: 42.8 % (ref 39.0–52.0)
HEMOGLOBIN: 13.7 g/dL (ref 13.0–17.0)
MCH: 26.6 pg (ref 26.0–34.0)
MCHC: 32 g/dL (ref 30.0–36.0)
MCV: 82.9 fL (ref 78.0–100.0)
Platelets: 207 10*3/uL (ref 150–400)
RBC: 5.16 MIL/uL (ref 4.22–5.81)
RDW: 12.7 % (ref 11.5–15.5)
WBC: 5.6 10*3/uL (ref 4.0–10.5)

## 2018-04-30 LAB — HIV ANTIBODY (ROUTINE TESTING W REFLEX): HIV SCREEN 4TH GENERATION: NONREACTIVE

## 2018-04-30 LAB — LIPID PANEL
CHOL/HDL RATIO: 5.9 ratio
CHOLESTEROL: 229 mg/dL — AB (ref 0–200)
HDL: 39 mg/dL — AB (ref >40)
LDL CALC: 137 mg/dL — AB (ref 0–99)
TRIGLYCERIDES: 266 mg/dL — AB (ref <150)
VLDL: 53 mg/dL — AB (ref 0–40)

## 2018-04-30 LAB — HEMOGLOBIN A1C
Hgb A1c MFr Bld: 11.5 % — ABNORMAL HIGH (ref 4.8–5.6)
Mean Plasma Glucose: 283.35 mg/dL

## 2018-04-30 LAB — ECHOCARDIOGRAM COMPLETE
Height: 68 in
Weight: 3375.68 oz

## 2018-04-30 MED ORDER — ACETAMINOPHEN 325 MG PO TABS
650.0000 mg | ORAL_TABLET | ORAL | Status: DC | PRN
  Administered 2018-04-30 – 2018-05-01 (×2): 650 mg via ORAL
  Filled 2018-04-30 (×2): qty 2

## 2018-04-30 MED ORDER — ASPIRIN EC 81 MG PO TBEC
81.0000 mg | DELAYED_RELEASE_TABLET | Freq: Every day | ORAL | Status: DC
  Administered 2018-04-30 – 2018-05-02 (×3): 81 mg via ORAL
  Filled 2018-04-30 (×3): qty 1

## 2018-04-30 MED ORDER — IOPAMIDOL (ISOVUE-370) INJECTION 76%
50.0000 mL | Freq: Once | INTRAVENOUS | Status: AC | PRN
  Administered 2018-04-30: 50 mL via INTRAVENOUS

## 2018-04-30 MED ORDER — ACETAMINOPHEN 160 MG/5ML PO SOLN: 650.0000 mg | ORAL | Status: DC | PRN

## 2018-04-30 MED ORDER — IOPAMIDOL (ISOVUE-370) INJECTION 76%
INTRAVENOUS | Status: AC
  Administered 2018-04-30: 10:00:00
  Filled 2018-04-30: qty 50

## 2018-04-30 MED ORDER — SENNOSIDES-DOCUSATE SODIUM 8.6-50 MG PO TABS: 1.0000 | ORAL_TABLET | Freq: Every evening | ORAL | Status: DC | PRN

## 2018-04-30 MED ORDER — STROKE: EARLY STAGES OF RECOVERY BOOK
Freq: Once | Status: AC
  Administered 2018-04-30: 01:00:00
  Filled 2018-04-30: qty 1

## 2018-04-30 MED ORDER — CLOPIDOGREL BISULFATE 75 MG PO TABS
300.0000 mg | ORAL_TABLET | Freq: Once | ORAL | Status: AC
  Administered 2018-04-30: 300 mg via ORAL
  Filled 2018-04-30: qty 4

## 2018-04-30 MED ORDER — LIVING WELL WITH DIABETES BOOK
Freq: Once | Status: AC
  Administered 2018-04-30: 17:00:00
  Filled 2018-04-30: qty 1

## 2018-04-30 MED ORDER — ACETAMINOPHEN 650 MG RE SUPP: 650.0000 mg | RECTAL | Status: DC | PRN

## 2018-04-30 MED ORDER — INSULIN GLARGINE 100 UNIT/ML ~~LOC~~ SOLN
15.0000 [IU] | Freq: Every day | SUBCUTANEOUS | Status: DC
  Administered 2018-04-30 – 2018-05-01 (×2): 15 [IU] via SUBCUTANEOUS
  Filled 2018-04-30 (×3): qty 0.15

## 2018-04-30 MED ORDER — SODIUM CHLORIDE 0.9 % IV SOLN
INTRAVENOUS | Status: DC
  Administered 2018-04-30 – 2018-05-01 (×3): via INTRAVENOUS

## 2018-04-30 MED ORDER — ATORVASTATIN CALCIUM 40 MG PO TABS
40.0000 mg | ORAL_TABLET | Freq: Every day | ORAL | Status: DC
  Administered 2018-04-30 – 2018-05-01 (×2): 40 mg via ORAL
  Filled 2018-04-30 (×2): qty 1

## 2018-04-30 MED ORDER — ENOXAPARIN SODIUM 40 MG/0.4ML ~~LOC~~ SOLN
40.0000 mg | SUBCUTANEOUS | Status: DC
  Administered 2018-04-30 – 2018-05-02 (×3): 40 mg via SUBCUTANEOUS
  Filled 2018-04-30 (×3): qty 0.4

## 2018-04-30 MED ORDER — INSULIN STARTER KIT- PEN NEEDLES (ENGLISH)
1.0000 | Freq: Once | Status: AC
  Administered 2018-04-30: 1
  Filled 2018-04-30: qty 1

## 2018-04-30 MED ORDER — CLOPIDOGREL BISULFATE 75 MG PO TABS
75.0000 mg | ORAL_TABLET | Freq: Every day | ORAL | Status: DC
  Administered 2018-05-01 – 2018-05-02 (×2): 75 mg via ORAL
  Filled 2018-04-30 (×2): qty 1

## 2018-04-30 NOTE — Progress Notes (Addendum)
STROKE TEAM PROGRESS NOTE   INTERVAL HISTORY His vascular technician is at the bedside.  He admits to not taking medicine as prescribed at the time of his last stroke as well as eating and drinking poorly including lots of regular [facility]. Overall, he remains stable neurologically since yesterday. 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  Vitals:   04/30/18 0211 04/30/18 0405 04/30/18 0548 04/30/18 0838  BP: (!) 161/86 (!) 147/79 (!) 167/97 (!) 168/100  Pulse: 68 71 72 70  Resp: Temp:   98 F (36.7 C) 98 F (36.7 C)  TempSrc:    Oral  SpO2: 97% 97% 97% 93%  Weight:      Height:        CBC:  Recent Labs  Lab 04/29/18 1732 04/29/18 1755 04/30/18 0128  WBC 4.7  --  5.6  NEUTROABS 2.6  --   --   HGB 14.2 15.0 13.7  HCT 44.4 44.0 42.8  MCV 83.0  --  82.9  PLT 221  --  207    Basic Metabolic Panel:  Recent Labs  Lab 04/29/18 1732 04/29/18 1755 04/30/18 0128  NA 138 139 140  K 3.6 3.6 3.3*  CL 103 102 105  CO2 23  --  26  GLUCOSE 351* 359* 332*  BUN CREATININE 1.11 0.90 1.07  1.09  CALCIUM 8.7*  --  8.6*   Lipid Panel:     Component Value Date/Time   CHOL 229 (H) 04/30/2018 0128   CHOL 216 (H) 01/04/2015 1449   TRIG 266 (H) 04/30/2018 0128   HDL 39 (L) 04/30/2018 0128   HDL 42 01/04/2015 1449   CHOLHDL 5.9 04/30/2018 0128   VLDL 53 (H) 04/30/2018 0128   LDLCALC 137 (H) 04/30/2018 0128   LDLCALC 143 (H) 01/04/2015 1449   HgbA1c:  Lab Results  Component Value Date   HGBA1C 11.5 (H) 04/30/2018   Urine Drug Screen:     Component Value Date/Time   LABOPIA NONE DETECTED 11/12/2013 0408   COCAINSCRNUR NONE DETECTED 11/12/2013 0408   LABBENZ NONE DETECTED 11/12/2013 0408   AMPHETMU NONE DETECTED 11/12/2013 0408   THCU NONE DETECTED 11/12/2013 0408   LABBARB NONE DETECTED 11/12/2013 0408    Alcohol Level     Component Value Date/Time   ETH <11 11/12/2013 0159    IMAGING Ct Angio Head W  Or Wo Contrast  Result Date: 04/30/2018 CLINICAL DATA:  Stroke follow-up.  Left-sided weakness EXAM: CT ANGIOGRAPHY HEAD AND NECK TECHNIQUE: Multidetector CT imaging of the head and neck was performed using the standard protocol during bolus administration of intravenous contrast. Multiplanar CT image reconstructions and MIPs were obtained to evaluate the vascular anatomy. Carotid stenosis measurements (when applicable) are obtained utilizing NASCET criteria, using the distal internal carotid diameter as the denominator. CONTRAST:  50mL ISOVUE-370 IOPAMIDOL (ISOVUE-370) INJECTION 76% COMPARISON:  Head CT from yesterday. Report from catheter angiogram in 2014 FINDINGS: CTA NECK FINDINGS Aortic arch: Possible atheromatous wall thickening. Right carotid system: Possible atheromatous wall thickening. No stenosis or ulceration. Comparatively small right ICA in the setting of aplastic right A1 segment. Left carotid system: Possible atheromatous wall thickening. No ulceration or stenosis. Vertebral arteries: No proximal subclavian stenosis. Mild left vertebral artery dominance. Borderline right vertebral origin stenosis. 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 Skeleton: Spondylosis and asymmetric left degenerative facet spurring. Other neck: Negative Upper chest: Negative  Review of the MIP images confirms the above findings CTA HEAD FINDINGS Anterior circulation: Aplastic right A1 segment. Large bilateral posterior communicating arteries. Mild presumably atheromatous irregularity of bilateral M2 vessels. No proximal flow limiting stenosis. No branch occlusion. Negative for aneurysm. Posterior circulation: Symmetric vertebral arteries. Mild to moderate narrowing of the distal left V4 segment. The basilar is smooth and widely patent. 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 Mild atheromatous narrowing of the left proximal PCA. No branch occlusion flow limiting stenosis. Venous sinuses: Patent Anatomic  variants: As above Delayed phase: No abnormal intracranial enhancement. Bilateral perforator infarcts and chronic small vessel ischemic change in the cerebral white matter that could be aged by MRI. Small remote appearing right upper cerebellar infarct. Review of the MIP images confirms the above findings IMPRESSION: 1. No emergent finding. 2. Overall mild atherosclerosis without branch occlusion or proximal flow limiting stenosis. Atheromatous irregularity primarily affects medium size vessels. Electronically Signed   By: Marnee Spring M.D.   On: 04/30/2018 11:51   Ct Head Wo Contrast  Result Date: 04/29/2018 CLINICAL DATA:  Left upper and lower extremity weakness for 2 days EXAM: CT HEAD WITHOUT CONTRAST TECHNIQUE: Contiguous axial images were obtained from the base of the skull through the vertex without intravenous contrast. COMPARISON:  None. FINDINGS: Brain: No acute territorial infarction, hemorrhage or intracranial mass. Mild to moderate small vessel ischemic changes of the white matter. Probable remote infarcts in the bilateral basal ganglia. Nonenlarged ventricles Vascular: No hyperdense vessels. Scattered calcifications at the carotid siphons. Skull: Normal. Negative for fracture or focal lesion. Sinuses/Orbits: No acute finding. Other: None IMPRESSION: 1. No definite CT evidence for acute intracranial abnormality. 2. Probable remote infarcts within the bilateral basal ganglia. Moderate small vessel ischemic changes of the white matter. Electronically Signed   By: Jasmine Pang M.D.   On: 04/29/2018 18:45   Ct Angio Neck W Or Wo Contrast  Result Date: 04/30/2018 CLINICAL DATA:  Stroke follow-up.  Left-sided weakness EXAM: CT ANGIOGRAPHY HEAD AND NECK TECHNIQUE: Multidetector CT imaging of the head and neck was performed using the standard protocol during bolus administration of intravenous contrast. Multiplanar CT image reconstructions and MIPs were obtained to evaluate the vascular anatomy.  Carotid stenosis measurements (when applicable) are obtained utilizing NASCET criteria, using the distal internal carotid diameter as the denominator. CONTRAST:  50mL ISOVUE-370 IOPAMIDOL (ISOVUE-370) INJECTION 76% COMPARISON:  Head CT from yesterday. Report from catheter angiogram in 2014 FINDINGS: CTA NECK FINDINGS Aortic arch: Possible atheromatous wall thickening. Right carotid system: Possible atheromatous wall thickening. No stenosis or ulceration. Comparatively small right ICA in the setting of aplastic right A1 segment. Left carotid system: Possible atheromatous wall thickening. No ulceration or stenosis. Vertebral arteries: No proximal subclavian stenosis. Mild left vertebral artery dominance. Borderline right vertebral origin stenosis. 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 Skeleton: Spondylosis and asymmetric left degenerative facet spurring. Other neck: Negative Upper chest: Negative Review of the MIP images confirms the above findings CTA HEAD FINDINGS Anterior circulation: Aplastic right A1 segment. Large bilateral posterior communicating arteries. Mild presumably atheromatous irregularity of bilateral M2 vessels. No proximal flow limiting stenosis. No branch occlusion. Negative for aneurysm. Posterior circulation: Symmetric vertebral arteries. Mild to moderate narrowing of the distal left V4 segment. The basilar is smooth and widely patent. 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 Mild atheromatous narrowing of the left proximal PCA. No branch occlusion flow limiting stenosis. Venous sinuses: Patent Anatomic variants: As above Delayed phase: No abnormal  intracranial enhancement. Bilateral perforator infarcts and chronic small vessel ischemic change in the cerebral white matter that could be aged by MRI. Small remote appearing right upper cerebellar infarct. Review of the MIP images confirms the above findings IMPRESSION: 1. No emergent finding. 2. Overall mild atherosclerosis  without branch occlusion or proximal flow limiting stenosis. Atheromatous irregularity primarily affects medium size vessels. Electronically Signed   By: Marnee Spring M.D.   On: 04/30/2018 11:51   2D Echocardiogram  - Left ventricle: The cavity size was normal. Wall thickness was increased in a pattern of mild LVH. Systolic function was mildly reduced. The estimated ejection fraction was in the range of 45% to 50%. Diffuse hypokinesis. Doppler parameters are consistent with abnormal left ventricular relaxation (grade 1 diastolic dysfunction). - Left atrium: The atrium was mildly dilated. - Tricuspid valve: There was mild regurgitation. Impressions:   No cardiac source of emboli was indentified.  MRI pending  PHYSICAL EXAM GENERAL: Awake, alert in NAD.  Poor dentition HEENT: - Normocephalic and atraumatic, dry mm,  LUNGS - Clear to auscultation bilaterally with no wheezes CV - S1S2 RRR, no m/r/g, equal pulses bilaterally. Ext: warm, well perfused, intact peripheral pulses, no edema NEURO:  Mental Status: AA&Ox3  Language: speech is mildly dysarthric.  Naming, repetition, fluency, and comprehension intact. Cranial Nerves: PERRL. EOMI, visual fields full, no facial asymmetry, facial sensation intact, hearing intact, tongue/uvula/soft palate midline, normal sternocleidomastoid and trapezius muscle strength. No evidence of tongue atrophy or fibrillations Motor: Left upper extremity 4+/5, left lower extremity 4/5 with vertical drift, right upper and lower extremities 5/5. Tone: is normal and bulk is normal Sensation- Intact to light touch bilaterally Coordination: UE intact bilaterally Gait- deferred   ASSESSMENT/PLAN Benjamin Cohen is a 62 y.o. male with history of HTN, DB and previous stroke in 2014 d/t Small vessel disease presenting with L hemiparesis.   Stroke:  right brain infarct, etiology workup underway  CT head No acute stroke. Old B BG infarct. Small vessel disease.    CTA head & neck no emergent finding.  Mild atherosclerosis medium vessels without significant stenosis.  MRI  pending   2D Echo  EF 45-50%. No source of embolus   LDL 137  HgbA1c 11.5  UDS pending  Lovenox 40 mg sq daily for VTE prophylaxis  No antithrombotic prior to admission (prescribed but not taking), now on aspirin 81 mg daily and clopidogrel 75 mg daily following a plavix load.  Continue dual antiplatelets x 3 months then aspirin alone at time of discharge.    Therapy recommendations: Outpatient PT and OT  Disposition: Pending  Hypertension  Stable . Permissive hypertension (OK if < 220/120) but gradually normalize in 5-7 days . Long-term BP goal normotensive  Hyperlipidemia  Home meds:  No statin  LDL 137, goal < 70  Now on lipitor 40  Continue statin at discharge  Diabetes type II  HgbA1c 11.5, goal < 7.0  Uncontrolled  Sliding scale insulin  CBGs  Diabetic nurses following with recommendations  Other Stroke Risk Factors  Obesity, Body mass index is 32.08 kg/m., recommend weight loss, diet and exercise as appropriate   Hx stroke/TIA  11/2013 - Left caudate nucleus, basal ganglia and corona radiata infarct due to small vessel disease s/p IV tPA  Other Active Problems  Medication noncompliance  Hospital day # 1  Annie Main, MSN, APRN, ANVP-BC, AGPCNP-BC Advanced Practice Stroke Nurse [facility] See Amion for Schedule & Pager information 04/30/2018 4:54 PM  To contact Stroke Continuity provider, please refer to [email or web address]. After hours, contact General Neurology   ATTENDING NOTE: I reviewed above note and agree with the assessment and plan. I have made any additions or clarifications directly to the above note. Pt was seen and examined.   62 year old male with history of hypertension, diabetes admitted for left-sided weakness.  He had a stroke in 11/2013 with right BG infarct.  MRI also showed left old BG infarct.  He  received TPA.  BP controlled with IV Cardizem.  LDL 140 and A1c 6.8.  He was discharged on aspirin Lipitor 10.  After discharge he was not compliant with medication.  BP still high and found to have diabetes, no PCP follow-up.  Not compliant with medication.  This time patient admitted for left-sided weakness.  CT showed old bilateral BG infarcts.  CTA head and neck unremarkable.  EF 45 to 50%.  MRI pending.  LDL 137 and A1c 11.5.  His blood pressure still high and still has hyperglycemia.  Currently he is on Lantus.  Recommend aspirin 81 and Plavix for 3 weeks and then Plavix alone.  Continue Lipitor.  Medication compliance need to be reinforced.  Marvel Plan, MD PhD Stroke Neurology 04/30/2018 5:24 PM

## 2018-04-30 NOTE — Progress Notes (Signed)
  Echocardiogram 2D Echocardiogram has been performed.  Horrace Hanak T Rayann Jolley 04/30/2018, 1:53 PM

## 2018-04-30 NOTE — Progress Notes (Signed)
PROGRESS NOTE    Benjamin Cohen  UJW:[id number] DOB: [birth year] DOA: 04/29/2018 PCP: Patient, No Pcp Per   Brief Narrative:  62 year old male with history of essential hypertension, previous CVA in 2014 without any residual symptoms, diabetes mellitus type 2 but not taking any home medications comes to the hospital with complains of left lower extremity weakness which started about 4 days ago.  He thought it would get better at first but due to failure resolution of his symptoms he came to the ER for further evaluation.  In the ER he had CT of the head done which did not show any acute findings but did show a remote infarct and by lateral basilar ganglia.  Neurology was consulted and patient was admitted for further work-up.   Assessment & Plan:   Principal Problem:   Cerebral infarction [facility]) Active Problems:   Hypertensive urgency   Diabetes (HCC)   HLD (hyperlipidemia)   Cerebral thrombosis with cerebral infarction   CVA (cerebral vascular accident) (HCC)   Left lower extremity weakness concerns for CVA, persist History of bibasilar ganglier CVA back in 2014 -CT of the head does not show any acute stroke but shows remote infarct in bilateral bibasilar ganglia region.  CTA of the head and neck does not show any emergent findings, overall mild atherosclerotic disease. -Stroke protocol -Allow for permissive hypertension for the first 24-48h - only treat PRN if SBP >220 mmHg. Blood pressures can be gradually normalized to SBP<140 upon discharge. -MRI brain without contrast-pending -Maintain Euthermia.  -Aspirin 81 mg daily, Plavix has been added. -Echocardiogram performed, results pending -Hemoglobin A1c 11.5, lipid panel shows LDL 137 -Lipitor 40 mg daily has been started -Frequent neuro checks -Atorvastatin PO within 24 hrs.  -Risk factor modification -Neurology team is following -OT recommendations-outpatient OT, PT and speech eval pending  Diabetes mellitus type 2,  uncontrolled -Hemoglobin A1c is 11.5.  He will be started on Lantus here at 10 units but I will increase to 15 units, insulin sliding scale and Accu-Chek -Diabetic coordinator has been consulted.  Essential hypertension -Not taking any home medications, allow permissive hypertension at this time  Hyperlipidemia with elevated LDL -Lipitor 40 mg daily started  Medication noncompliance - I had an extensive discussion with him this morning regarding importance of medical and dietary compliance.   DVT prophylaxis: Lovenox Code Status: Full code Family Communication: None at bedside Disposition Plan: Maintain inpatient stay as patient still needs further evaluation and management.  Consultants:   Neurology  Procedures:   None  Antimicrobials:   None   Subjective: Patient still reports of left lower extremity weakness and describes it as it feels very heavy.  Review of Systems Otherwise negative except as per HPI, including: General: Denies fever, chills, night sweats or unintended weight loss. Resp: Denies cough, wheezing, shortness of breath. Cardiac: Denies chest pain, palpitations, orthopnea, paroxysmal nocturnal dyspnea. GI: Denies abdominal pain, nausea, vomiting, diarrhea or constipation GU: Denies dysuria, frequency, hesitancy or incontinence MS: Denies muscle aches, joint pain or swelling Neuro: Denies headache, abnormal gait Psych: Denies anxiety, depression, SI/HI/AVH Skin: Denies new rashes or lesions ID: Denies sick contacts, exotic exposures, travel  Objective: Vitals:   04/30/18 0548 04/30/18 0838 04/30/18 1022 04/30/18 1226  BP: (!) 167/97 (!) 168/100 (!) 160/100 (!) 155/103  Pulse: 72 70 71 73  Resp: Temp: 98 F (36.7 C) 98 F (36.7 C)  98.2 F (36.8 C)  TempSrc:  Oral  Oral  SpO2: 97%  93% 97% 98%  Weight:      Height:        Intake/Output Summary (Last 24 hours) at 04/30/2018 1323 Last data filed at 04/30/2018 1249 Gross per 24  hour  Intake 732.5 ml  Output 600 ml  Net 132.5 ml   Filed Weights   04/29/18 1659 04/30/18 0015  Weight: 93.9 kg (207 lb) 95.7 kg (210 lb 15.7 oz)    Examination:  General exam: Appears calm and comfortable  Respiratory system: Clear to auscultation. Respiratory effort normal. Cardiovascular system: S1 & S2 heard, RRR. No JVD, murmurs, rubs, gallops or clicks. No pedal edema. Gastrointestinal system: Abdomen is nondistended, soft and nontender. No organomegaly or masses felt. Normal bowel sounds heard. Central nervous system: Alert and oriented. CN II-XII intact. LLE weakness noted 4/5 Extremities: Left lower extremity strength is 4/5, rest of the extremities 5/5 Skin: No rashes, lesions or ulcers Psychiatry: Judgement and insight appear normal. Mood & affect appropriate.     Data Reviewed:   CBC: Recent Labs  Lab 04/29/18 1732 04/29/18 1755 04/30/18 0128  WBC 4.7  --  5.6  NEUTROABS 2.6  --   --   HGB 14.2 15.0 13.7  HCT 44.4 44.0 42.8  MCV 83.0  --  82.9  PLT 221  --  207   Basic Metabolic Panel: Recent Labs  Lab 04/29/18 1732 04/29/18 1755 04/30/18 0128  NA 138 139 140  K 3.6 3.6 3.3*  CL 103 102 105  CO2 23  --  26  GLUCOSE 351* 359* 332*  BUN CREATININE 1.11 0.90 1.07  1.09  CALCIUM 8.7*  --  8.6*   GFR: Estimated Creatinine Clearance: 76.8 mL/min (by C-G formula based on SCr of 1.09 mg/dL). Liver Function Tests: Recent Labs  Lab 04/29/18 1732 04/30/18 0128  AST 19 15  ALT 16* 14*  ALKPHOS 100 89  BILITOT 0.7 1.1  PROT 7.0 6.6  ALBUMIN 3.7 3.4*   No results for input(s): LIPASE, AMYLASE in the last 168 hours. No results for input(s): AMMONIA in the last 168 hours. Coagulation Profile: Recent Labs  Lab 04/29/18 1732  INR 0.94   Cardiac Enzymes: No results for input(s): CKTOTAL, CKMB, CKMBINDEX, TROPONINI in the last 168 hours. BNP (last 3 results) No results for input(s): PROBNP in the last 8760 hours. HbA1C: Recent Labs     04/30/18 0128  HGBA1C 11.5*   CBG: Recent Labs  Lab 04/29/18 1955 04/30/18 0025 04/30/18 0810 04/30/18 1232  GLUCAP 269* 245* 240* 232*   Lipid Profile: Recent Labs    04/30/18 0128  CHOL 229*  HDL 39*  LDLCALC 137*  TRIG 266*  CHOLHDL 5.9   Thyroid Function Tests: No results for input(s): TSH, T4TOTAL, FREET4, T3FREE, THYROIDAB in the last 72 hours. Anemia Panel: No results for input(s): VITAMINB12, FOLATE, FERRITIN, TIBC, IRON, RETICCTPCT in the last 72 hours. Sepsis Labs: No results for input(s): PROCALCITON, LATICACIDVEN in the last 168 hours.  No results found for this or any previous visit (from the past 240 hour(s)).       Radiology Studies: Ct Angio Head W Or Wo Contrast  Result Date: 04/30/2018 CLINICAL DATA:  Stroke follow-up.  Left-sided weakness EXAM: CT ANGIOGRAPHY HEAD AND NECK TECHNIQUE: Multidetector CT imaging of the head and neck was performed using the standard protocol during bolus administration of intravenous contrast. Multiplanar CT image reconstructions and MIPs were obtained to evaluate the vascular anatomy. Carotid stenosis measurements (when applicable) are obtained  utilizing NASCET criteria, using the distal internal carotid diameter as the denominator. CONTRAST:  50mL ISOVUE-370 IOPAMIDOL (ISOVUE-370) INJECTION 76% COMPARISON:  Head CT from yesterday. Report from catheter angiogram in 2014 FINDINGS: CTA NECK FINDINGS Aortic arch: Possible atheromatous wall thickening. Right carotid system: Possible atheromatous wall thickening. No stenosis or ulceration. Comparatively small right ICA in the setting of aplastic right A1 segment. Left carotid system: Possible atheromatous wall thickening. No ulceration or stenosis. Vertebral arteries: No proximal subclavian stenosis. Mild left vertebral artery dominance. Borderline right vertebral origin stenosis. 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 Skeleton: Spondylosis and asymmetric left degenerative  facet spurring. Other neck: Negative Upper chest: Negative Review of the MIP images confirms the above findings CTA HEAD FINDINGS Anterior circulation: Aplastic right A1 segment. Large bilateral posterior communicating arteries. Mild presumably atheromatous irregularity of bilateral M2 vessels. No proximal flow limiting stenosis. No branch occlusion. Negative for aneurysm. Posterior circulation: Symmetric vertebral arteries. Mild to moderate narrowing of the distal left V4 segment. The basilar is smooth and widely patent. 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 Mild atheromatous narrowing of the left proximal PCA. No branch occlusion flow limiting stenosis. Venous sinuses: Patent Anatomic variants: As above Delayed phase: No abnormal intracranial enhancement. Bilateral perforator infarcts and chronic small vessel ischemic change in the cerebral white matter that could be aged by MRI. Small remote appearing right upper cerebellar infarct. Review of the MIP images confirms the above findings IMPRESSION: 1. No emergent finding. 2. Overall mild atherosclerosis without branch occlusion or proximal flow limiting stenosis. Atheromatous irregularity primarily affects medium size vessels. Electronically Signed   By: Marnee Spring M.D.   On: 04/30/2018 11:51   Ct Head Wo Contrast  Result Date: 04/29/2018 CLINICAL DATA:  Left upper and lower extremity weakness for 2 days EXAM: CT HEAD WITHOUT CONTRAST TECHNIQUE: Contiguous axial images were obtained from the base of the skull through the vertex without intravenous contrast. COMPARISON:  None. FINDINGS: Brain: No acute territorial infarction, hemorrhage or intracranial mass. Mild to moderate small vessel ischemic changes of the white matter. Probable remote infarcts in the bilateral basal ganglia. Nonenlarged ventricles Vascular: No hyperdense vessels. Scattered calcifications at the carotid siphons. Skull: Normal. Negative for fracture or focal lesion.  Sinuses/Orbits: No acute finding. Other: None IMPRESSION: 1. No definite CT evidence for acute intracranial abnormality. 2. Probable remote infarcts within the bilateral basal ganglia. Moderate small vessel ischemic changes of the white matter. Electronically Signed   By: Jasmine Pang M.D.   On: 04/29/2018 18:45   Ct Angio Neck W Or Wo Contrast  Result Date: 04/30/2018 CLINICAL DATA:  Stroke follow-up.  Left-sided weakness EXAM: CT ANGIOGRAPHY HEAD AND NECK TECHNIQUE: Multidetector CT imaging of the head and neck was performed using the standard protocol during bolus administration of intravenous contrast. Multiplanar CT image reconstructions and MIPs were obtained to evaluate the vascular anatomy. Carotid stenosis measurements (when applicable) are obtained utilizing NASCET criteria, using the distal internal carotid diameter as the denominator. CONTRAST:  50mL ISOVUE-370 IOPAMIDOL (ISOVUE-370) INJECTION 76% COMPARISON:  Head CT from yesterday. Report from catheter angiogram in 2014 FINDINGS: CTA NECK FINDINGS Aortic arch: Possible atheromatous wall thickening. Right carotid system: Possible atheromatous wall thickening. No stenosis or ulceration. Comparatively small right ICA in the setting of aplastic right A1 segment. Left carotid system: Possible atheromatous wall thickening. No ulceration or stenosis. Vertebral arteries: No proximal subclavian stenosis. Mild left vertebral artery dominance. Borderline right vertebral origin stenosis. There is a fenestration of  the left V3 segment. Skeleton: Spondylosis and asymmetric left degenerative facet spurring. Other neck: Negative Upper chest: Negative Review of the MIP images confirms the above findings CTA HEAD FINDINGS Anterior circulation: Aplastic right A1 segment. Large bilateral posterior communicating arteries. Mild presumably atheromatous irregularity of bilateral M2 vessels. No proximal flow limiting stenosis. No branch occlusion. Negative for aneurysm.  Posterior circulation: Symmetric vertebral arteries. Mild to moderate narrowing of the distal left V4 segment. The basilar is smooth and widely patent. 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 Mild atheromatous narrowing of the left proximal PCA. No branch occlusion flow limiting stenosis. Venous sinuses: Patent Anatomic variants: As above Delayed phase: No abnormal intracranial enhancement. Bilateral perforator infarcts and chronic small vessel ischemic change in the cerebral white matter that could be aged by MRI. Small remote appearing right upper cerebellar infarct. Review of the MIP images confirms the above findings IMPRESSION: 1. No emergent finding. 2. Overall mild atherosclerosis without branch occlusion or proximal flow limiting stenosis. Atheromatous irregularity primarily affects medium size vessels. Electronically Signed   By: Marnee Spring M.D.   On: 04/30/2018 11:51        Scheduled Meds: . aspirin EC  81 mg Oral Daily  . atorvastatin  40 mg Oral q1800  . [START ON 05/01/2018] clopidogrel  75 mg Oral Daily  . enoxaparin (LOVENOX) injection  40 mg Subcutaneous Q24H  . insulin aspart  0-20 Units Subcutaneous TID WC  . insulin glargine  10 Units Subcutaneous QHS   Continuous Infusions: . sodium chloride 75 mL/hr at 04/30/18 1110     LOS: 1 day    I have spent 35 minutes face to face with the patient and on the ward discussing the patients care, assessment, plan and disposition with other care givers. >50% of the time was devoted counseling the patient about the risks and benefits of treatment and coordinating care.     Benjamin Faith Joline Maxcy, MD [facility] Pager [phone]   If 7PM-7AM, please contact night-coverage [email or web address] Password [email or web address] 04/30/2018, 1:23 PM

## 2018-04-30 NOTE — Plan of Care (Signed)
  Problem: Clinical Measurements: Goal: Ability to maintain clinical measurements within normal limits will improve Outcome: Progressing Goal: Will remain free from infection Outcome: Progressing Goal: Diagnostic test results will improve Outcome: Progressing Goal: Respiratory complications will improve Outcome: Progressing   Problem: Activity: Goal: Risk for activity intolerance will decrease 04/30/2018 1702 by Silverio Lay, RN Outcome: Progressing 04/30/2018 1701 by Silverio Lay, RN Outcome: Progressing   Problem: Nutrition: Goal: Adequate nutrition will be maintained Outcome: Progressing   Problem: Pain Managment: Goal: General experience of comfort will improve Outcome: Progressing   Problem: Safety: Goal: Ability to remain free from injury will improve Outcome: Progressing   Problem: Education: Goal: Knowledge of disease or condition will improve Outcome: Progressing   Problem: Self-Care: Goal: Ability to participate in self-care as condition permits will improve Outcome: Progressing

## 2018-04-30 NOTE — Evaluation (Signed)
Physical Therapy Evaluation Patient Details Name: Benjamin Cohen MRN: [id number] DOB: 10-14-1956 Today's Date: 04/30/2018   History of Present Illness  Pt is a 62 y.o. male admitted 04/29/18 for evaluation of left-sided weakness that started on Friday 5/17; NIHSS=3. Pt does report previous CVA 3 years ago that affected L-side, but no residual weakness (per chart, was R cerebral stroke involving right caudate and corona radiata; was given TPA). CT shows no acute intracranial abnormality; probable remote infarcts within bilateral basal ganglia. MRI pending.    Clinical Impression  Pt presents with an overall decrease in functional mobility secondary to above. PTA, pt indep, works full-time, and lives with son. Today, pt amb with supervision for safety; DGI 20/24 indicates decreased risk for falls. Pt c/o LLE "heaviness" throughout session; LLE strength 4/5 throughout. Demonstrates decreased insight into deficits, decreased safety awareness, and intermittent decreased coordination throughout session; easily distracted. Pt would benefit from continued acute PT services to maximize functional mobility and independence prior to d/c with outpatient neuro PT follow-up for return to PLOF.     Follow Up Recommendations Outpatient PT;Supervision - Intermittent(Outpatient neuro PT/OT)    Equipment Recommendations  None recommended by PT    Recommendations for Other Services       Precautions / Restrictions Precautions Precautions: Fall Restrictions Weight Bearing Restrictions: No      Mobility  Bed Mobility Overal bed mobility: Modified Independent                Transfers Overall transfer level: Needs assistance   Transfers: Sit to/from Stand Sit to Stand: Supervision         General transfer comment: Supervision for safety due to LLE weakness/incoordination; intermittent cues for safety  Ambulation/Gait Ambulation/Gait assistance: Supervision;Min guard     Gait  Pattern/deviations: Step-through pattern;Decreased stride length;Decreased dorsiflexion - left;Decreased weight shift to right Gait velocity: Decreased Gait velocity interpretation: <1.8 ft/sec, indicate of risk for recurrent falls General Gait Details: Initial min guard for balance progressing to supervision. Pt distracted throughout ambulating due to "funny feeling" in LLE; pt with slight L-lateral lean/limp with decreased L foot clearance although strength WFL.   Stairs            Wheelchair Mobility    Modified Rankin (Stroke Patients Only) Modified Rankin (Stroke Patients Only) Pre-Morbid Rankin Score: No symptoms Modified Rankin: Slight disability     Balance Overall balance assessment: Needs assistance   Sitting balance-Leahy Scale: Good       Standing balance-Leahy Scale: Good                   Standardized Balance Assessment Standardized Balance Assessment : Dynamic Gait Index   Dynamic Gait Index Level Surface: Mild Impairment Change in Gait Speed: Mild Impairment Gait with Horizontal Head Turns: Normal Gait with Vertical Head Turns: Normal Gait and Pivot Turn: Mild Impairment Step Over Obstacle: Mild Impairment Step Around Obstacles: Normal Steps: Normal Total Score: 20       Pertinent Vitals/Pain Pain Assessment: No/denies pain    Home Living Family/patient expects to be discharged to:: Private residence Living Arrangements: Children Available Help at Discharge: Family;Friend(s);Available PRN/intermittently Type of Home: House Home Access: Stairs to enter Entrance Stairs-Rails: None Entrance Stairs-Number of Steps: 2 Home Layout: One level Home Equipment: None Additional Comments: Son lives with pt    Prior Function Level of Independence: Independent         Comments: Drives; 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);  likes to fish     Hand Dominance   Dominant Hand: Right    Extremity/Trunk  Assessment   Upper Extremity Assessment Upper Extremity Assessment: LUE deficits/detail;Defer to OT evaluation LUE Deficits / Details: Slightly dysmetric with finger-to-nose testing LUE Coordination: decreased fine motor;decreased gross motor    Lower Extremity Assessment Lower Extremity Assessment: LLE deficits/detail LLE Deficits / Details: LLE grossly 4/5 throughout LLE Coordination: decreased gross motor       Communication   Communication: No difficulties  Cognition Arousal/Alertness: Awake/alert Behavior During Therapy: WFL for tasks assessed/performed Overall Cognitive Status: Impaired/Different from baseline Area of Impairment: Attention;Awareness;Safety/judgement                   Current Attention Level: Selective     Safety/Judgement: Decreased awareness of deficits;Decreased awareness of safety Awareness: Emergent   General Comments: Pt with decreased attention, easily distracted by LLE impairments while walking; difficulty with multi-tasking and maintaining attention      General Comments      Exercises     Assessment/Plan    PT Assessment Patient needs continued PT services  PT Problem List Decreased balance;Decreased mobility;Decreased activity tolerance;Decreased cognition;Decreased safety awareness       PT Treatment Interventions DME instruction;Gait training;Stair training;Functional mobility training;Therapeutic activities;Therapeutic exercise;Balance training;Neuromuscular re-education;Patient/family education    PT Goals (Current goals can be found in the Care Plan section)  Acute Rehab PT Goals Patient Stated Goal: Return home PT Goal Formulation: With patient Time For Goal Achievement: 05/14/18 Potential to Achieve Goals: Good    Frequency Min 4X/week   Barriers to discharge        Co-evaluation               AM-PAC PT "6 Clicks" Daily Activity  Outcome Measure Difficulty turning over in bed (including adjusting  bedclothes, sheets and blankets)?: None Difficulty moving from lying on back to sitting on the side of the bed? : None Difficulty sitting down on and standing up from a chair with arms (e.g., wheelchair, bedside commode, etc,.)?: None Help needed moving to and from a bed to chair (including a wheelchair)?: A Little Help needed walking in hospital room?: A Little Help needed climbing 3-5 steps with a railing? : A Little 6 Click Score: 21    End of Session Equipment Utilized During Treatment: Gait belt Activity Tolerance: Patient tolerated treatment well Patient left: in bed;with call bell/phone within reach;with nursing/sitter in room Nurse Communication: Mobility status PT Visit Diagnosis: Other abnormalities of gait and mobility (R26.89)    Time: 5621-3086 PT Time Calculation (min) (ACUTE ONLY): 19 min   Charges:   PT Evaluation $PT Eval Moderate Complexity: 1 Mod     PT G Codes:       Ina Homes, PT, DPT Acute Rehab Services  Pager: [phone]  Malachy Chamber 04/30/2018, 1:42 PM

## 2018-04-30 NOTE — Progress Notes (Addendum)
Inpatient Diabetes Program Recommendations  AACE/ADA: New Consensus Statement on Inpatient Glycemic Control (2015)  Target Ranges:  Prepandial:   less than 140 mg/dL      Peak postprandial:   less than 180 mg/dL (1-2 hours)      Critically ill patients:  140 - 180 mg/dL   Results for Benjamin Cohen, Benjamin Cohen (MRN [id number]) as of 04/30/2018 14:16  Ref. Range 04/29/2018 19:55 04/30/2018 00:25 04/30/2018 08:10 04/30/2018 12:32  Glucose-Capillary Latest Ref Range: 65 - 99 mg/dL 409 (H) 811 (H) 914 (H) 232 (H)  Results for Benjamin Cohen, Benjamin Cohen (MRN [id number]) as of 04/30/2018 14:16  Ref. Range 01/04/2015 14:49 04/30/2018 01:28  Hemoglobin A1C Latest Ref Range: 4.8 - 5.6 % 6.8 (H) 11.5 (H)   Review of Glycemic Control  Diabetes history: DM2 Outpatient Diabetes medications: None Current orders for Inpatient glycemic control: Lantus 15 units QHS, Novolog 0-20 units TID with meals  Inpatient Diabetes Program Recommendations:  Insulin - Basal: Noted Lantus was increased from 10 to 15 units today. Correction (SSI): Please consider ordering Novolog 0-5 units QHS for bedtime correction. Insulin - Meal Coverage: Please consider ordering Novolog 4 units TID with meals for meal coverage if patient eats at least 50% of meals. HgbA1C: A1C 11.5% on 04/30/18 indicating an average glucose of 283 mg/dl over the past 2-3 months.  Addendum 04/30/18@15 :00-Spoke with patient about DM control. Patient states that he has never been told he had DM. Informed patient that in reviewing chart, noted patient was dx with DM in December 2014 during prior hospitalization. Patient denies that he was ever told he had DM despite progress note on 11/18/03 by J. Orlinda Blalock, Diabetes Coordinator who spoke with patient about new DM. Therefore, talked with patient as this was a new diabetes diagnosis.   Discussed A1C results (11.5% on 04/30/18) and explained what an A1C is and informed patient that his current A1C indicates an average glucose of 283 mg/dl over  the past 2-3 months. Discussed basic pathophysiology of DM Type 2, basic home care, importance of checking CBGs and maintaining good CBG control to prevent long-term and short-term complications. Reviewed glucose and A1C goals. Reviewed signs and symptoms of hyperglycemia and hypoglycemia along with treatment for both. Discussed impact of nutrition, exercise, stress, sickness, and medications on diabetes control. Patient reports that he has not been following any type of diet and he drinks 3-4 regular mt dews per day and frequently eats chips and cookies. Discussed carb modified diet and informed patient that RD has been consulted for further diet education. Informed patient that a Living Well with diabetes booklet has been ordered and encouraged patient to read through entire book once he receives it..  Discussed medications used for DM control (orals versus insulin). Patient states that he would prefer to take oral DM medications but states he will try the insulin shots if the doctor feels that is best. Discussed Lantus and Novolog insulin as ordered and discussed vial/syringe and insulin pens. Demonstrated how to use both vial/syringe and insulin pens and patient states that he would prefer insulin pens if he had to use insulin at home. Educated patient on insulin pen use at home. Reviewed all steps of insulin pen including attachment of needle, 2-unit air shot, dialing up dose, giving injection, removing needle, disposal of sharps, storage of unused insulin, disposal of insulin etc. Patient able to provide successful return demonstration with several verbal cues. Stressed importance of lifestyle modifications (diet, exercise) and medication compliance to decrease risk of further  complications from DM. Patient reports that he knows he has got to do better and stop drinking mt dews. Patient seems to have poor health literacy of information discussed. Informed patient that he will need to start checking his glucose  as MD instructs and he will need to establish care with PCP for follow up. Patient states that he does not have a PCP but he understands he will need to start seeing a doctor consistently. Asked patient to keep a log book of glucose readings. Explained how the doctor he follows up with can use the log book to continue to make adjustments with DM medications if needed.  Informed patient that RN will be asking him to self-administer insulin to ensure proper technique and ability to administer self insulin shots.   Patient verbalized understanding of information discussed and he states that he has no further questions at this time related to diabetes.   RNs to provide ongoing basic DM education at bedside with this patient and engage patient to actively check blood glucose and administer insulin injections. Diabetes Coordinator will follow up with patient again tomorrow.  Thanks, Orlando Penner, RN, MSN, CDE Diabetes Coordinator Inpatient Diabetes Program [phone] (Team Pager from 8am to 5pm)

## 2018-04-30 NOTE — Progress Notes (Signed)
Occupational Therapy Evaluation Patient Details Name: Benjamin Cohen MRN: [id number] DOB: [birth year] Today's Date: 04/30/2018    History of Present Illness 62 y.o. male with past medical history of hypertension, presented to the emergency room for evaluation of left-sided weakness that started on Friday, 04/26/2018. NIHSS=3. 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  He was given TPA at that time). Blood glucose was 360 on arrival. Stroke work up underway.   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 Pt currently requires minguard A with mobility and set up/S with with ADL due to below listed deficits. Pt states his L sided weakness is improving, however, not at baseline. Recommend pt follow up with OT at the neuro outpt center to facilitate return to PLOF and ability to work. Due to attentional deficits noted during assessment, recommend pt refrain from driving at this time. Will follow acutely to maximize independence with ADL and mobility and increase functional use of LUE to facilitate safe DC home with intermittent S.     Follow Up Recommendations  Outpatient OT;Supervision - Intermittent(neuro outpt)    Equipment Recommendations  None recommended by OT    Recommendations for Other Services       Precautions / Restrictions Precautions Precautions: Fall Restrictions Weight Bearing Restrictions: No      Mobility Bed Mobility Overal bed mobility: Modified Independent             General bed mobility comments: pt sitting EOB upon arrival   Transfers Overall transfer level: Needs assistance   Transfers: Sit to/from Stand Sit to Stand: Min guard         General transfer comment: Due to LLE  weakness/incoordination pt requires assistance for supervision/safety    Balance Overall balance assessment: Mild deficits observed, not formally tested;History of Falls(will formally assess)                                         ADL either performed or assessed with clinical judgement   ADL Overall ADL's : Needs assistance/impaired Eating/Feeding: Modified independent   Grooming: Set up;Supervision/safety;Standing   Upper Body Bathing: Set up;Supervision/ safety;Standing   Lower Body Bathing: Supervison/ safety;Set up;Sit to/from stand   Upper Body Dressing : Supervision/safety;Set up Upper Body Dressing Details (indicate cue type and reason): difficulty with manipulation of buttons Lower Body Dressing: Min guard Lower Body Dressing Details (indicate cue type and reason): difficulty using L hand for socks Toilet Transfer: Min guard;Ambulation   Toileting- Clothing Manipulation and Hygiene: Modified independent     Tub/Shower Transfer Details (indicate cue type and reason): difficulty with single leg stance; needs exteranl support Functional mobility during ADLs: Min guard       Vision Baseline Vision/History: Wears glasses Wears Glasses: At all times(lost glasses, hasn't noticed changes in vision) Vision Assessment?: Yes Eye Alignment: Within Functional Limits Ocular Range of Motion: Within Functional Limits Alignment/Gaze Preference: Within Defined Limits Tracking/Visual Pursuits: Able to track stimulus in all quads without difficulty Saccades: Within functional limits Convergence: Within functional limits Visual Fields: No apparent deficits Additional Comments: ? decreaed visual attention     Perception Perception Comments: will  further assess   Praxis Praxis Praxis tested?: Within functional limits    Pertinent Vitals/Pain Pain Assessment: 0-10 Pain Score: 2  Pain Location: back Pain Descriptors / Indicators: Discomfort Pain  Intervention(s): Limited activity within patient's tolerance     Hand Dominance Right   Extremity/Trunk Assessment Upper Extremity Assessment Upper Extremity Assessment: LUE deficits/detail RUE Deficits / Details: pt reports weakness RUE Sensation: WNL RUE Coordination: WNL LUE Deficits / Details: Pt with isolated movement throughout LUE - overall Brunstrom level V (arm and hand). Demonstrates difficulty with in-hand manipulation skills; fine motor/coordiantion (ableto open toothpaste but dropping item); cues to use L hand at times; using functionally; poor dysdiadochokenesis; decreased 2pt discrimination LUE Sensation: decreased light touch LUE Coordination: decreased fine motor;decreased gross motor(coordination appears affected by attention)   Lower Extremity Assessment Lower Extremity Assessment: LLE deficits/detail;Defer to PT evaluation LLE Deficits / Details: weakness LLE Coordination: decreased gross motor   Cervical / Trunk Assessment Cervical / Trunk Assessment: Other exceptions(note mild L lateral lean during dynamic task)   Communication Communication Communication: No difficulties   Cognition Arousal/Alertness: Awake/alert Behavior During Therapy: WFL for tasks assessed/performed Overall Cognitive Status: Impaired/Different from baseline(willl further assess) Area of Impairment: Memory;Attention;Awareness;Safety/judgement                   Current Attention Level: Selective Memory: Decreased short-term memory(will further assess)   Safety/Judgement: Decreased awareness of deficits;Decreased awareness of safety(walking in rom on arrival with IV/PULSE OX plugged into wall) Awareness: Emergent   General Comments: will further assess. Required vc to complete Menu activity. Pt asked to locate lunch menu item for "tomorro" and pt located item for Sunday. When pointing out the errror, pt required min vc to correct   General Comments       Exercises      Shoulder Instructions      Home Living Family/patient expects to be discharged to:: Private residence Living Arrangements: Children Available Help at Discharge: Available PRN/intermittently;Family;Friend(s) Type of Home: House Home Access: Stairs to enter [employer] of Steps: 2 Entrance Stairs-Rails: None Home Layout: One level     Bathroom Shower/Tub: Chief Strategy Officer: Handicapped height Bathroom Accessibility: Yes How Accessible: Accessible via walker Home Equipment: None          Prior Functioning/Environment Level of Independence: Independent        Comments: Drives; 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); likes to fish        OT Problem List: Decreased strength;Decreased range of motion;Impaired balance (sitting and/or standing);Decreased coordination;Decreased cognition;Decreased knowledge of use of DME or AE;Impaired sensation;Impaired UE functional use      OT Treatment/Interventions: Self-care/ADL training;Therapeutic exercise;DME and/or AE instruction;Neuromuscular education;Therapeutic activities;Cognitive remediation/compensation;Patient/family education;Balance training    OT Goals(Current goals can be found in the care plan section) Acute Rehab OT Goals Patient Stated Goal: Get better and fish again  OT Goal Formulation: With patient Time For Goal Achievement: 05/14/18 Potential to Achieve Goals: Good  OT Frequency: Min 2X/week   Barriers to D/C:            Co-evaluation              AM-PAC PT "6 Clicks" Daily Activity     Outcome Measure Help from another person eating meals?: None Help from another person taking care of personal grooming?: A Little Help from another person toileting, which includes using toliet, bedpan, or urinal?: A Little Help from another person bathing (including washing,  rinsing, drying)?: A Little Help from another person to put on and taking off regular  upper body clothing?: A Little Help from another person to put on and taking off regular lower body clothing?: A Little 6 Click Score: 19   End of Session Equipment Utilized During Treatment: Gait belt Nurse Communication: Mobility status;Other (comment)(need for S with mobility)  Activity Tolerance: Patient tolerated treatment well Patient left: in bed;with call bell/phone within reach  OT Visit Diagnosis: Unsteadiness on feet (R26.81);Muscle weakness (generalized) (M62.81)                Time: 9604-5409 OT Time Calculation (min): 33 min Charges:  OT General Charges $OT Visit: 1 Visit OT Evaluation $OT Eval Moderate Complexity: 1 Mod OT Treatments $Self Care/Home Management : 8-22 mins G-Codes:     Luisa Dago, OT/L  OT Clinical Specialist [phone]   [facility] 04/30/2018, 10:16 AM

## 2018-04-30 NOTE — Progress Notes (Addendum)
NCM received consult: No PCP; will likely discharge new to insulin and will need follow up. NCM scheduled appointment  with [facility] with Clementeen Graham NP for 05/17/2018 @ 2:45 pm. NCM made pt aware and noted on AVS. Gae Gallop RN,BSN.CM

## 2018-04-30 NOTE — Care Management Note (Signed)
Case Management Note  Patient Details  Name: Benjamin Cohen MRN: [id number] Date of Birth: 08-02-1956  Subjective/Objective:    Admitted with CVA,history of essential hypertension, previous CVA in 2014 without any residual symptoms, diabetes mellitus type 2. Resides with son.             Action/Plan: Transition to home with outpatient PT /OT to follow. Hospital follow up scheduled for 05/17/2018 with [facility] @ 2:45 pm, noted on AVS.  Expected Discharge Date:                  Expected Discharge Plan:  Home/Self Care  In-House Referral:     Discharge planning Services  CM Consult  Post Acute Care Choice:    Choice offered to:     DME Arranged:    DME Agency:     HH Arranged:    HH Agency:     Status of Service:  Completed, signed off  If discussed at [employer], dates discussed:    Additional Comments:  Epifanio Lesches, RN 04/30/2018, 4:25 PM

## 2018-04-30 NOTE — Progress Notes (Signed)
SLP Cancellation Note  Patient Details Name: Benjamin Cohen MRN: [id number] DOB: 1955-12-24   Cancelled treatment:       Reason Eval/Treat Not Completed: Patient at procedure or test/unavailable   Maxcine Ham 04/30/2018, 10:47 AM  Maxcine Ham, M.A. CCC-SLP [phone]

## 2018-05-01 DIAGNOSIS — I63311 Cerebral infarction due to thrombosis of right middle cerebral artery: Secondary | ICD-10-CM

## 2018-05-01 LAB — GLUCOSE, CAPILLARY
GLUCOSE-CAPILLARY: 187 mg/dL — AB (ref 65–99)
Glucose-Capillary: 257 mg/dL — ABNORMAL HIGH (ref 65–99)
Glucose-Capillary: 164 mg/dL — ABNORMAL HIGH (ref 65–99)
Glucose-Capillary: 207 mg/dL — ABNORMAL HIGH (ref 65–99)

## 2018-05-01 MED ORDER — INSULIN ASPART 100 UNIT/ML ~~LOC~~ SOLN
4.0000 [IU] | Freq: Three times a day (TID) | SUBCUTANEOUS | Status: DC
  Administered 2018-05-01 – 2018-05-02 (×4): 4 [IU] via SUBCUTANEOUS

## 2018-05-01 MED ORDER — INSULIN ASPART 100 UNIT/ML ~~LOC~~ SOLN: 4.0000 [IU] | Freq: Three times a day (TID) | SUBCUTANEOUS | Status: DC

## 2018-05-01 MED ORDER — INSULIN ASPART 100 UNIT/ML ~~LOC~~ SOLN
0.0000 [IU] | Freq: Three times a day (TID) | SUBCUTANEOUS | Status: DC
  Administered 2018-05-01: 3 [IU] via SUBCUTANEOUS
  Administered 2018-05-01: 8 [IU] via SUBCUTANEOUS
  Administered 2018-05-02: 3 [IU] via SUBCUTANEOUS
  Administered 2018-05-02: 5 [IU] via SUBCUTANEOUS

## 2018-05-01 MED ORDER — INSULIN ASPART 100 UNIT/ML ~~LOC~~ SOLN
0.0000 [IU] | Freq: Every day | SUBCUTANEOUS | Status: DC
  Administered 2018-05-01: 2 [IU] via SUBCUTANEOUS

## 2018-05-01 NOTE — Progress Notes (Addendum)
STROKE TEAM PROGRESS NOTE   INTERVAL HISTORY Patient sitting up in chair. No new complaints. Says he feels about 70% back to normal. Still with mild L hemiparesis and difficulty with some tasks. Plans to stop drinking Mt Dew and commits to taking his meds. OP PT and OT with neuro f/u in 4 wks.    Vitals:   04/30/18 1507 04/30/18 1950 04/30/18 2354 05/01/18 0435  BP: (!) 149/100 (!) 150/97 (!) 178/103 (!) 163/102  Pulse: 73 86  67  Resp: 16   18  Temp: (!) 97.5 F (36.4 C) 98.5 F (36.9 C)  98.2 F (36.8 C)  TempSrc: Oral Oral  Oral  SpO2: 98% 98%  96%  Weight:      Height:        CBC:  Recent Labs  Lab 04/29/18 1732 04/29/18 1755 04/30/18 0128  WBC 4.7  --  5.6  NEUTROABS 2.6  --   --   HGB 14.2 15.0 13.7  HCT 44.4 44.0 42.8  MCV 83.0  --  82.9  PLT 221  --  207    Basic Metabolic Panel:  Recent Labs  Lab 04/29/18 1732 04/29/18 1755 04/30/18 0128  NA 138 139 140  K 3.6 3.6 3.3*  CL 103 102 105  CO2 23  --  26  GLUCOSE 351* 359* 332*  BUN CREATININE 1.11 0.90 1.07  1.09  CALCIUM 8.7*  --  8.6*   Lipid Panel:     Component Value Date/Time   CHOL 229 (H) 04/30/2018 0128   CHOL 216 (H) 01/04/2015 1449   TRIG 266 (H) 04/30/2018 0128   HDL 39 (L) 04/30/2018 0128   HDL 42 01/04/2015 1449   CHOLHDL 5.9 04/30/2018 0128   VLDL 53 (H) 04/30/2018 0128   LDLCALC 137 (H) 04/30/2018 0128   LDLCALC 143 (H) 01/04/2015 1449   HgbA1c:  Lab Results  Component Value Date   HGBA1C 11.5 (H) 04/30/2018   Urine Drug Screen:     Component Value Date/Time   LABOPIA NONE DETECTED 04/30/2018 2159   COCAINSCRNUR NONE DETECTED 04/30/2018 2159   LABBENZ NONE DETECTED 04/30/2018 2159   AMPHETMU NONE DETECTED 04/30/2018 2159   THCU NONE DETECTED 04/30/2018 2159   LABBARB NONE DETECTED 04/30/2018 2159    Alcohol Level     Component Value Date/Time   ETH <11 11/12/2013 0159    IMAGING Ct Angio Head W Or Wo Contrast  Result Date: 04/30/2018 CLINICAL DATA:   Stroke follow-up.  Left-sided weakness EXAM: CT ANGIOGRAPHY HEAD AND NECK TECHNIQUE: Multidetector CT imaging of the head and neck was performed using the standard protocol during bolus administration of intravenous contrast. Multiplanar CT image reconstructions and MIPs were obtained to evaluate the vascular anatomy. Carotid stenosis measurements (when applicable) are obtained utilizing NASCET criteria, using the distal internal carotid diameter as the denominator. CONTRAST:  50mL ISOVUE-370 IOPAMIDOL (ISOVUE-370) INJECTION 76% COMPARISON:  Head CT from yesterday. Report from catheter angiogram in 2014 FINDINGS: CTA NECK FINDINGS Aortic arch: Possible atheromatous wall thickening. Right carotid system: Possible atheromatous wall thickening. No stenosis or ulceration. Comparatively small right ICA in the setting of aplastic right A1 segment. Left carotid system: Possible atheromatous wall thickening. No ulceration or stenosis. Vertebral arteries: No proximal subclavian stenosis. Mild left vertebral artery dominance. Borderline right vertebral origin stenosis. 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 Skeleton: Spondylosis and asymmetric left degenerative facet spurring. Other neck: Negative Upper chest: Negative Review of the MIP  images confirms the above findings CTA HEAD FINDINGS Anterior circulation: Aplastic right A1 segment. Large bilateral posterior communicating arteries. Mild presumably atheromatous irregularity of bilateral M2 vessels. No proximal flow limiting stenosis. No branch occlusion. Negative for aneurysm. Posterior circulation: Symmetric vertebral arteries. Mild to moderate narrowing of the distal left V4 segment. The basilar is smooth and widely patent. 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 Mild atheromatous narrowing of the left proximal PCA. No branch occlusion flow limiting stenosis. Venous sinuses: Patent Anatomic variants: As above Delayed phase: No abnormal intracranial  enhancement. Bilateral perforator infarcts and chronic small vessel ischemic change in the cerebral white matter that could be aged by MRI. Small remote appearing right upper cerebellar infarct. Review of the MIP images confirms the above findings IMPRESSION: 1. No emergent finding. 2. Overall mild atherosclerosis without branch occlusion or proximal flow limiting stenosis. Atheromatous irregularity primarily affects medium size vessels. Electronically Signed   By: Marnee Spring M.D.   On: 04/30/2018 11:51   Ct Head Wo Contrast  Result Date: 04/29/2018 CLINICAL DATA:  Left upper and lower extremity weakness for 2 days EXAM: CT HEAD WITHOUT CONTRAST TECHNIQUE: Contiguous axial images were obtained from the base of the skull through the vertex without intravenous contrast. COMPARISON:  None. FINDINGS: Brain: No acute territorial infarction, hemorrhage or intracranial mass. Mild to moderate small vessel ischemic changes of the white matter. Probable remote infarcts in the bilateral basal ganglia. Nonenlarged ventricles Vascular: No hyperdense vessels. Scattered calcifications at the carotid siphons. Skull: Normal. Negative for fracture or focal lesion. Sinuses/Orbits: No acute finding. Other: None IMPRESSION: 1. No definite CT evidence for acute intracranial abnormality. 2. Probable remote infarcts within the bilateral basal ganglia. Moderate small vessel ischemic changes of the white matter. Electronically Signed   By: Jasmine Pang M.D.   On: 04/29/2018 18:45   Ct Angio Neck W Or Wo Contrast  Result Date: 04/30/2018 CLINICAL DATA:  Stroke follow-up.  Left-sided weakness EXAM: CT ANGIOGRAPHY HEAD AND NECK TECHNIQUE: Multidetector CT imaging of the head and neck was performed using the standard protocol during bolus administration of intravenous contrast. Multiplanar CT image reconstructions and MIPs were obtained to evaluate the vascular anatomy. Carotid stenosis measurements (when applicable) are obtained  utilizing NASCET criteria, using the distal internal carotid diameter as the denominator. CONTRAST:  50mL ISOVUE-370 IOPAMIDOL (ISOVUE-370) INJECTION 76% COMPARISON:  Head CT from yesterday. Report from catheter angiogram in 2014 FINDINGS: CTA NECK FINDINGS Aortic arch: Possible atheromatous wall thickening. Right carotid system: Possible atheromatous wall thickening. No stenosis or ulceration. Comparatively small right ICA in the setting of aplastic right A1 segment. Left carotid system: Possible atheromatous wall thickening. No ulceration or stenosis. Vertebral arteries: No proximal subclavian stenosis. Mild left vertebral artery dominance. Borderline right vertebral origin stenosis. 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 Skeleton: Spondylosis and asymmetric left degenerative facet spurring. Other neck: Negative Upper chest: Negative Review of the MIP images confirms the above findings CTA HEAD FINDINGS Anterior circulation: Aplastic right A1 segment. Large bilateral posterior communicating arteries. Mild presumably atheromatous irregularity of bilateral M2 vessels. No proximal flow limiting stenosis. No branch occlusion. Negative for aneurysm. Posterior circulation: Symmetric vertebral arteries. Mild to moderate narrowing of the distal left V4 segment. The basilar is smooth and widely patent. 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 Mild atheromatous narrowing of the left proximal PCA. No branch occlusion flow limiting stenosis. Venous sinuses: Patent Anatomic variants: As above Delayed phase: No abnormal intracranial enhancement. Bilateral perforator  infarcts and chronic small vessel ischemic change in the cerebral white matter that could be aged by MRI. Small remote appearing right upper cerebellar infarct. Review of the MIP images confirms the above findings IMPRESSION: 1. No emergent finding. 2. Overall mild atherosclerosis without branch occlusion or proximal flow limiting stenosis.  Atheromatous irregularity primarily affects medium size vessels. Electronically Signed   By: Marnee Spring M.D.   On: 04/30/2018 11:51   Mr Brain Wo Contrast  Result Date: 04/30/2018 CLINICAL DATA:  62 y/o M; left lower extremity weakness since Friday. EXAM: MRI HEAD WITHOUT CONTRAST TECHNIQUE: Multiplanar, multiecho pulse sequences of the brain and surrounding structures were obtained without intravenous contrast. COMPARISON:  04/30/2018 CTA head and neck. FINDINGS: Brain: 9 mm focus of reduced diffusion in the right posterior corona radiata additional subcentimeter focus in right posterior insula compatible with acute/early subacute infarction. No associated hemorrhage or mass effect. Multiple small chronic infarctions are present involving the bilateral anterior basal ganglia, right posterior lentiform nucleus, and the right cerebellar hemisphere. The chronic infarctions within the left anterior basal ganglia and right posterior lentiform nucleus demonstrate hemosiderin stain. Patchy nonspecific foci of T2 FLAIR hyperintense signal abnormality in subcortical and periventricular white matter are compatible with moderate to severe chronic microvascular ischemic changes for age. Moderate brain parenchymal volume loss. No extra-axial collection, hydrocephalus, or herniation. Vascular: Normal flow voids. Skull and upper cervical spine: Normal marrow signal. Sinuses/Orbits: Negative. Other: None. IMPRESSION: 1. Subcentimeter acute/early subacute infarctions within the right posterior corona radiata and the right posterior insula. No associated hemorrhage or mass effect. 2. Moderate to severe chronic microvascular ischemic changes and moderate parenchymal volume loss of the brain for age. 3. Small chronic infarctions within the bilateral anterior basal ganglia, right posterior lentiform nucleus, and right cerebellar hemisphere. These results will be called to the ordering clinician or representative by the  Radiologist Assistant, and communication documented in the PACS or zVision Dashboard. Electronically Signed   By: Mitzi Hansen M.D.   On: 04/30/2018 18:57   2D Echocardiogram  - Left ventricle: The cavity size was normal. Wall thickness was increased in a pattern of mild LVH. Systolic function was mildly reduced. The estimated ejection fraction was in the range of 45% to 50%. Diffuse hypokinesis. Doppler parameters are consistent with abnormal left ventricular relaxation (grade 1 diastolic dysfunction). - Left atrium: The atrium was mildly dilated. - Tricuspid valve: There was mild regurgitation. Impressions:   No cardiac source of emboli was indentified.   PHYSICAL EXAM GENERAL: Awake, alert in NAD.  Poor dentition HEENT: - Normocephalic and atraumatic, dry mm,  LUNGS - Clear to auscultation bilaterally with no wheezes CV - S1S2 RRR, no m/r/g, equal pulses bilaterally. Ext: warm, well perfused, intact peripheral pulses, 1+ LE pitting edema NEURO:  Mental Status: AA&Ox3  Language: speech is mildly dysarthric.  Naming, repetition, fluency intact. Able to follow 2-stop commands, difficulty with 3-step command Cranial Nerves: PERRL. EOMI, visual fields full, no facial asymmetry, facial sensation intact, hearing intact, tongue/uvula/soft palate midline, normal sternocleidomastoid and trapezius muscle strength. No evidence of tongue atrophy or fibrillations Motor: Left upper extremity 4+/5, left lower extremity 4/5 with vertical drift, right upper and lower extremities 5/5. Decreased FFM LUE, R arm orbits L Tone: is normal and bulk is normal Sensation- Intact to light touch bilaterally Coordination: mild LUE dysmetria Gait- deferred   ASSESSMENT/PLAN Benjamin Cohen is a 62 y.o. male with history of HTN, DB and previous stroke in 2014 d/t Small vessel disease  presenting with L hemiparesis.   Stroke:  right CR and insular infarct, etiology more consistent with small vessel  disease given risk factors and noncompliance. However, given insular cortex involvement, cardioembolic source can not be ruled out.   CT head No acute stroke. Old B BG infarct. Small vessel disease.   CTA head & neck no emergent finding.  Mild atherosclerosis medium vessels without significant stenosis.  MRI  R posterior corona radiata and posterior insular infarct. Mod to severe Small vessel disease. Atrophy.   2D Echo  EF 45-50%. No source of embolus   Recommend 30 day cardiac event monitoring as outpt to rule out afib.  LDL 137  HgbA1c 11.5  UDS negative   Lovenox 40 mg sq daily for VTE prophylaxis  No antithrombotic prior to admission (prescribed but not taking), now on aspirin 81 mg daily and clopidogrel 75 mg daily following a plavix load.  Continue dual antiplatelets x 3 WEEKS then aspirin alone   Therapy recommendations: Outpatient PT and OT  Patient advised not to drive at this time  Disposition: Pending NOTHING FURTHER TO ADD FROM THE STROKE STANDPOINT Patient has a 10-15% risk of having another stroke over the next year, the highest risk is within 2 weeks of the most recent stroke/TIA (risk of having a stroke following a stroke or TIA is the same). Ongoing risk factor control by Primary Care Physician Stroke Service will sign off. Please call should any needs arise. Follow-up Stroke Clinic at [facility] in 4 weeks, order placed.  Hypertension  Stable . Permissive hypertension (OK if < 220/120) but gradually normalize in 5-7 days . Long-term BP goal normotensive  Hyperlipidemia  Home meds:  No statin  LDL 137, goal < 70  Now on lipitor 40  Continue statin at discharge  Diabetes type II  HgbA1c 11.5, goal < 7.0  Uncontrolled  Sliding scale insulin  CBGs  Diabetic nurses following with recommendations  Other Stroke Risk Factors  Obesity, Body mass index is 32.08 kg/m., recommend weight loss, diet and exercise as appropriate   Avoid Mt. Dew  Hx stroke/TIA  11/2013 - Left caudate nucleus, basal ganglia and corona radiata infarct due to small vessel disease s/p IV tPA  Other Active Problems  Medication noncompliance - agreeable to take meds "like I'm supposed to"  Hospital day # 2  Annie Main, MSN, APRN, ANVP-BC, AGPCNP-BC Advanced Practice Stroke Nurse [facility] See Amion for Schedule & Pager information 05/01/2018 8:28 AM   To contact Stroke Continuity provider, please refer to [email or web address]. After hours, contact General Neurology   ATTENDING NOTE: I reviewed above note and agree with the assessment and plan. I have made any additions or clarifications directly to the above note. Pt was seen and examined.   62 year old male with history of hypertension, diabetes, hx of stroke admitted for left-sided weakness. CT showed old bilateral BG infarcts.  CTA head and neck unremarkable.  EF 45 to 50%. MRI showed right CR and insular cortex small infarcts.  LDL 137 and A1c 11.5.  Risk factor not under control.  Recommend aspirin 81 and Plavix for 3 weeks and then Plavix alone.  Continue Lipitor.  Medication compliance need to be reinforced. Although stroke most likely small vessel etiology, due to insular involvement, cardioembolic stroke can not be completely ruled out at this time. Will recommend 30 day cardiac event monitoring to rule out afib as outpt.   Neurology will sign off. Please call with questions. Pt will  follow up with stroke clinic NP at [facility] in about 4 weeks. Thanks for the consult.   Marvel Plan, MD PhD Stroke Neurology 05/01/2018 9:56 PM

## 2018-05-01 NOTE — Plan of Care (Signed)
RD consulted for nutrition education regarding diabetes.   Lab Results  Component Value Date   HGBA1C 11.5 (H) 04/30/2018   62 year old male with history of essential hypertension, previous CVA in 2014 without any residual symptoms, diabetes mellitus type 2 but not taking any home medications comes to the hospital with complains of left lower extremity weakness which started about 4 days ago. Presents with new CVA  Discussed patients PO intake PTA, he states he was eating like crazy. Works at [employer]. Normally eats out multiple times a day.  RD provided "Carbohydrate Counting for People with Diabetes" handout from the Academy of Nutrition and Dietetics. Discussed different food groups and their effects on blood sugar, emphasizing carbohydrate-containing foods. Provided list of carbohydrates and recommended serving sizes of common foods. Also discussed plate method and portion control.   Discussed importance of controlled and consistent carbohydrate intake throughout the day. Provided examples of ways to balance meals/snacks and encouraged intake of high-fiber, whole grain complex carbohydrates. Teach back method used.  Expect fair compliance.  Body mass index is 32.08 kg/m. Pt meets criteria for obese class I based on current BMI.  Current diet order is carb modified, patient is consuming approximately 100% of meals at this time. Labs and medications reviewed. No further nutrition interventions warranted at this time. RD contact information provided. If additional nutrition issues arise, please re-consult RD.  Dionne Ano. Kelaiah Escalona, MS, RD LDN Inpatient Clinical Dietitian Pager [phone]

## 2018-05-01 NOTE — Evaluation (Signed)
Speech Language Pathology Evaluation Patient Details Name: Benjamin Cohen MRN: [id number] DOB: [birth year] Today's Date: 05/01/2018 Time: 4696-2952 SLP Time Calculation (min) (ACUTE ONLY): 29 min  Problem List:  Patient Active Problem List   Diagnosis Date Noted  . Noncompliance   . History of stroke   . Cerebral thrombosis with cerebral infarction 04/29/2018  . CVA (cerebral vascular accident) (HCC) 04/29/2018  . HLD (hyperlipidemia) 01/04/2015  . Diabetes (HCC) 11/14/2013  . Other and unspecified hyperlipidemia 11/14/2013  . Cerebral infarction (HCC) 11/12/2013  . Hypertensive urgency 11/12/2013   Past Medical History:  Past Medical History:  Diagnosis Date  . Hypertension   . Stroke [facility])    Past Surgical History:  Past Surgical History:  Procedure Laterality Date  . NO PAST SURGERIES     HPI:  Pt is a 62 y.o. male admitted 04/29/18 for evaluation of left-sided weakness that started on Friday 5/17; NIHSS=3. Pt does report previous CVA 3 years ago that affected L-side, but no residual weakness (per chart, was R cerebral stroke involving right caudate and corona radiata; was given TPA). CT shows no acute intracranial abnormality; probable remote infarcts within bilateral basal ganglia. MRI showed Subcentimeter acute/early subacute infarctions within the right posterior corona radiata and the right posterior insula.    Assessment / Plan / Recommendation Clinical Impression  Pt scored a 20/30 on the MOCA, indicative of cognitive impairment. Areas of difficulty included selective attention, working memory, simple calculations, complex problem solving/comprehension, and recall of new information. Pt needed Mod cues for recall of daily events and use of external aids. Pt has difficulty with intellectual awareness and verbalizing cognitive deficits, but he shows emergent awareness with Mod cues. Pt will benefit from OP SLP f/u to maximize functional independence and safety.    SLP  Assessment  SLP Recommendation/Assessment: Patient needs continued Speech Lanaguage Pathology Services SLP Visit Diagnosis: Cognitive communication deficit (R41.841)    Follow Up Recommendations  Outpatient SLP;24 hour supervision/assistance    Frequency and Duration min 2x/week  2 weeks      SLP Evaluation Cognition  Overall Cognitive Status: Impaired/Different from baseline Arousal/Alertness: Awake/alert Orientation Level: Oriented X4 Attention: Selective Selective Attention: Impaired Selective Attention Impairment: Verbal basic Memory: Impaired Memory Impairment: Retrieval deficit;Decreased recall of new information Awareness: Impaired Awareness Impairment: Intellectual impairment Problem Solving: Impaired Problem Solving Impairment: Verbal complex       Comprehension  Auditory Comprehension Overall Auditory Comprehension: Impaired Commands: Impaired One Step Basic Commands: 75-100% accurate Complex Commands: 50-74% accurate Conversation: Simple Interfering Components: Attention;Processing speed;Working [employer] fast streamer    Expression Expression Primary Mode of Expression: Verbal Verbal Expression Overall Verbal Expression: Impaired Initiation: No impairment Naming: Impairment Verbal Errors: Phonemic paraphasias(x2 in conversation)   Oral / Motor  Motor Speech Overall Motor Speech: Appears within functional limits for tasks assessed   GO                    Maxcine Ham 05/01/2018, 2:04 PM  Maxcine Ham, M.A. CCC-SLP [phone]

## 2018-05-01 NOTE — Progress Notes (Addendum)
Occupational Therapy Treatment Patient Details Name: Benjamin Cohen MRN: [id number] DOB: 08-07-1956 Today's Date: 05/01/2018    History of present illness Pt is a 62 y.o. male admitted 04/29/18 for evaluation of left-sided weakness that started on Friday 5/17; NIHSS=3. Pt does report previous CVA 3 years ago that affected L-side, but no residual weakness (per chart, was R cerebral stroke involving right caudate and corona radiata; was given TPA). CT shows no acute intracranial abnormality; probable remote infarcts within bilateral basal ganglia. MRI pending.   OT comments  Using written handouts, pt able to return demonstrate HEP for LUE fine motor/coordination/stengthening. Pt demonstrates apparnet cognitive deficits in addition to sensorimotor deficits and will benefit from further OT at a neuro outpt center to maximize independence and facilitate safe return to work. Discussed importance and supervision with medication management and refraining from driving with pt and son who verbalized understanding. Pt safe to D/C home with intermittent supervision when medically stable. Will continue to follow acutely.   Follow Up Recommendations  Outpatient OT;Supervision - Intermittent    Equipment Recommendations  3 in 1 bedside commode    Recommendations for Other Services      Precautions / Restrictions Precautions Precautions: Fall Restrictions Weight Bearing Restrictions: No       Mobility Bed Mobility Overal bed mobility: Independent               Transfers Overall transfer level: Needs assistance   Transfers: Sit to/from Stand Sit to Stand: Supervision         General transfer comment: supervison for safety due to L sided weakness    Balance Overall balance assessment: Needs assistance   Sitting balance-Leahy Scale: Good       Standing balance-Leahy Scale: Good                             ADL either performed or assessed with clinical judgement    ADL Overall ADL's : Needs assistance/impaired     Grooming: Wash/dry face;Oral care;Min guard;Standing                             Tub/Shower Transfer Details (indicate cue type and reason): difficulty with single leg stance; needs exteranl support Functional mobility during ADLs: Min guard General ADL Comments: due to cognitive deficits pt will require supervision for med management recommend pt shower when son is home. Recommend use 3in1 as shower seat.      Vision  mild Lside inattention     Perception     Praxis      Cognition Arousal/Alertness: Awake/alert Behavior During Therapy: WFL for tasks assessed/performed Overall Cognitive Status: Impaired/Different from baseline Area of Impairment: Attention;Memory;Safety/judgement;Awareness;Problem solving (recall 1/6 BeFast items with 1 min delay)                   Current Attention Level: Selective Memory: Decreased short-term memory   Safety/Judgement: Decreased awareness of deficits;Decreased awareness of safety Awareness: Emergent Problem Solving: Slow processing General Comments: pt able to appropriately fill in numbers on clock but unable to accurately set time; pt unable to correct error with VC. Pt states "thinking tasks" are more difficult at this time.      Exercises Exercises: Hand activities;Other exercises(educated on use of theraputty and HEP) Hand Activities Fine motor/gross motor coordination; BUE integrated activities.    Shoulder Instructions       General Comments called  pt's son to education on importance of supervision with med management/healthy diet/exercise to decrease risk of stroke  Pt did not drop items during activity. Elevates L shoulder during functional activities using LUE. Pt remains V Brunstrom level.     Pertinent Vitals/ Pain       Pain Assessment: 0-10 Pain Score: 1  Pain Location: L shoulder Pain Descriptors / Indicators: Discomfort Pain Intervention(s):  Monitored during session  Home Living                                          Prior Functioning/Environment              Frequency  Min 2X/week        Progress Toward Goals  OT Goals(current goals can now be found in the care plan section)  Progress towards OT goals: Progressing toward goals  Acute Rehab OT Goals Patient Stated Goal: return home OT Goal Formulation: With patient Time For Goal Achievement: 05/14/18 Potential to Achieve Goals: Good ADL Goals Pt Will Transfer to Toilet: with modified independence;ambulating Pt/caregiver will Perform Home Exercise Program: Increased strength;Left upper extremity;With theraputty;With written HEP provided Additional ADL Goal #1: Pt will independently verbalize 3 strategies to reduce risk of falls  Plan Discharge plan remains appropriate    Co-evaluation                 AM-PAC PT "6 Clicks" Daily Activity     Outcome Measure   Help from another person eating meals?: None Help from another person taking care of personal grooming?: A Little Help from another person toileting, which includes using toliet, bedpan, or urinal?: A Little Help from another person bathing (including washing, rinsing, drying)?: A Little Help from another person to put on and taking off regular upper body clothing?: A Little Help from another person to put on and taking off regular lower body clothing?: A Little 6 Click Score: 19    End of Session Equipment Utilized During Treatment: Gait belt  OT Visit Diagnosis: Unsteadiness on feet (R26.81);Muscle weakness (generalized) (M62.81)Other symptoms and signs involving cognitive function   Activity Tolerance Patient tolerated treatment well   Patient Left in bed;with call bell/phone within reach   Nurse Communication Mobility status;Other (comment)(communication with pt/son about medication management)        Time: 1610-9604 OT Time Calculation (min): 47  min  Charges:    Diona Browner OTS  [facility], OT/L  OT Clinical Specialist [phone]     05/01/2018, 11:00 AM

## 2018-05-01 NOTE — Progress Notes (Signed)
Inpatient Diabetes Program Recommendations  AACE/ADA: New Consensus Statement on Inpatient Glycemic Control (2015)  Target Ranges:  Prepandial:   less than 140 mg/dL      Peak postprandial:   less than 180 mg/dL (1-2 hours)      Critically ill patients:  140 - 180 mg/dL   Results for PADEN, SENGER (MRN [id number]) as of 05/01/2018 13:51  Ref. Range 04/30/2018 08:10 04/30/2018 12:32 04/30/2018 17:44 04/30/2018 20:53 05/01/2018 08:03 05/01/2018 12:11  Glucose-Capillary Latest Ref Range: 65 - 99 mg/dL 240 (H) 232 (H) 196 (H) 272 (H) 187 (H) 257 (H)  Results for TEVAN, MARIAN (MRN [id number]) as of 05/01/2018 13:51  Ref. Range 04/30/2018 01:28  Hemoglobin A1C Latest Ref Range: 4.8 - 5.6 % 11.5 (H)   Review of Glycemic Control  Diabetes history: DM2 Outpatient Diabetes medications: None Current orders for Inpatient glycemic control: Lantus 15 units QHS, Novolog 0-15 units TID with meals, Novolog 0-5 units QHS, Novolog 4 units TID with meals  Inpatient Diabetes Program Recommendations:  HgbA1C: A1C 11.5% on 04/30/18 indicating an average glucose of 283 mg/dl over the past 2-3 months.  NOTE: Noted Novolog meal coverage ordered today. Followed up with patient regarding DM and insulin education. Patient was NOT able to recall very much information at all that was discussed yesterday. Concerned about patient's understanding and safety of using insulin at home. Reviewed DM and insulin pen education again. Had patient demonstrate insulin administration with an insulin pen. Patient was able to demonstrate how to use the insulin pen with minimal verbal cues.  When asked about treatment for low glucose patient stated "I would give myself a shot." Explained that the insulin will cause glucose to drop more. Reviewed hypoglycemia along with proper treatment. Asked patient to read Living Well with DM book and insulin starter kit. Asked patient to administer all insulin injections while here at the hospital. Informed  patient that outpatient DM education would be ordered and he will receive a call about setting up appointment to follow up. Asked patient to check glucose as MD directs and to keep a record of glucose values. Requested he take the log book to follow up visits so MD can adjust DM medications if needed.  Patient verbalized understanding of information discussed and he reports that he does not have any questions at this time. Asked RN to be sure patient is self injecting insulin. Anticipate patient will need simplified DM medication regimen.   Thanks, Barnie Alderman, RN, MSN, CDE Diabetes Coordinator Inpatient Diabetes Program [phone] (Team Pager from 8am to 5pm)

## 2018-05-01 NOTE — Progress Notes (Addendum)
PROGRESS NOTE  Benjamin Cohen ZOX:[id number] DOB: [birth year] DOA: 04/29/2018 PCP: Patient, No Pcp Per  HPI/Recap of past 53 hours: 61 year old male with history of essential hypertension, previous CVA in 2014 without any residual symptoms, diabetes mellitus type 2 but not taking any home medications comes to the hospital with complains of left lower extremity weakness which started about 4 days ago.  He thought it would get better at first but due to failure resolution of his symptoms he came to the ER for further evaluation.  In the ER he had CT of the head done which did not show any acute findings but did show a remote infarct and by lateral basilar ganglia.  Neurology was consulted and patient was admitted for further work-up.  05/01/2018 patient seen and examined at bedside.  Reports generalized weakness while working with physical therapy.  No other complaints.  MRI brain abnormal.  Personally reviewed and revealed acute/early subacute infarctions within the right posterior corona radiator in the right posterior insula.   Assessment/Plan: Principal Problem:   Cerebral infarction [facility]) Active Problems:   Hypertensive urgency   Diabetes (HCC)   HLD (hyperlipidemia)   Cerebral thrombosis with cerebral infarction   CVA (cerebral vascular accident) (HCC)   Noncompliance   History of stroke   Acute/early subacute right posterior corona radiator and right posterior insula infarcts MRI brain without contrast done on 04/30/2018 Stroke team following.  Appreciate recommendations. Hemoglobin A1c 11.5 goal of 7.0 or less LDL 137 goal LDL less than 70. 2D echo revealed LVEF 45% with no sign of cardiac source for emboli Permissive hypertension in place Continue statin Continue diabetes control Continue aspirin and Plavix Continue PT Awaiting speech evaluation  History of CVA with acute early subacute infarcts Management as stated above  DM2, uncontrolled with hyperglycemia Continue  insulin sliding scale Diabetes coordinator following Recommendations for Lantus 15 unit NovoLog 0 to 5 units nightly for bedtime correction NovoLog 4 units 3 times daily with meals Hemoglobin A1c 11.5 on 04/30/2018  Hyperlipidemia LDL 137 Goal LDL less than 70 Continue statin  Chronic systolic CHF 2D echo done on 04/30/2018 revealed LVEF 45 to 50% with diffuse hypokinesis Last 2D echo done on 11/12/2013 revealed normal left ventricle function with no diffuse hypokinesis. Grade 1 dysfunction Cardiology will be consulted to start core measures with new changes     Code Status: Full  Family Communication: None at bedside  Disposition Plan: Home when clinically stable   Consultants:  Stroke team/neurology  Procedures:  None  Antimicrobials:  None  DVT prophylaxis: Subcu Lovenox   Objective: Vitals:   04/30/18 1950 04/30/18 2354 05/01/18 0435 05/01/18 0942  BP: (!) 150/97 (!) 178/103 (!) 163/102 (!) 158/113  Pulse: 86  67 84  Resp:   18 17  Temp: 98.5 F (36.9 C)  98.2 F (36.8 C) 98.3 F (36.8 C)  TempSrc: Oral  Oral Oral  SpO2: 98%  96% 98%  Weight:      Height:        Intake/Output Summary (Last 24 hours) at 05/01/2018 1012 Last data filed at 05/01/2018 8119 Gross per 24 hour  Intake 2266.25 ml  Output 1125 ml  Net 1141.25 ml   Filed Weights   04/29/18 1659 04/30/18 0015  Weight: 93.9 kg (207 lb) 95.7 kg (210 lb 15.7 oz)    Exam:  . General: 62 y.o. year-old male well developed well nourished in no acute distress.  Alert and oriented x3.  Mild slurring of speech. Marland Kitchen  Cardiovascular: Regular rate and rhythm with no rubs or gallops.  No thyromegaly or JVD noted.   Marland Kitchen Respiratory: Clear to auscultation with no wheezes or rales. Good inspiratory effort. . Abdomen: Soft nontender nondistended with normal bowel sounds x4 quadrants. . Musculoskeletal: No lower extremity edema. 2/4 pulses in all 4 extremities. . Skin: No ulcerative lesions noted or  rashes, . Psychiatry: Mood is appropriate for condition and setting   Data Reviewed: CBC: Recent Labs  Lab 04/29/18 1732 04/29/18 1755 04/30/18 0128  WBC 4.7  --  5.6  NEUTROABS 2.6  --   --   HGB 14.2 15.0 13.7  HCT 44.4 44.0 42.8  MCV 83.0  --  82.9  PLT 221  --  207   Basic Metabolic Panel: Recent Labs  Lab 04/29/18 1732 04/29/18 1755 04/30/18 0128  NA 138 139 140  K 3.6 3.6 3.3*  CL 103 102 105  CO2 23  --  26  GLUCOSE 351* 359* 332*  BUN CREATININE 1.11 0.90 1.07  1.09  CALCIUM 8.7*  --  8.6*   GFR: Estimated Creatinine Clearance: 76.8 mL/min (by C-G formula based on SCr of 1.09 mg/dL). Liver Function Tests: Recent Labs  Lab 04/29/18 1732 04/30/18 0128  AST 19 15  ALT 16* 14*  ALKPHOS 100 89  BILITOT 0.7 1.1  PROT 7.0 6.6  ALBUMIN 3.7 3.4*   No results for input(s): LIPASE, AMYLASE in the last 168 hours. No results for input(s): AMMONIA in the last 168 hours. Coagulation Profile: Recent Labs  Lab 04/29/18 1732  INR 0.94   Cardiac Enzymes: No results for input(s): CKTOTAL, CKMB, CKMBINDEX, TROPONINI in the last 168 hours. BNP (last 3 results) No results for input(s): PROBNP in the last 8760 hours. HbA1C: Recent Labs    04/30/18 0128  HGBA1C 11.5*   CBG: Recent Labs  Lab 04/30/18 0810 04/30/18 1232 04/30/18 1744 04/30/18 2053 05/01/18 0803  GLUCAP 240* 232* 196* 272* 187*   Lipid Profile: Recent Labs    04/30/18 0128  CHOL 229*  HDL 39*  LDLCALC 137*  TRIG 266*  CHOLHDL 5.9   Thyroid Function Tests: No results for input(s): TSH, T4TOTAL, FREET4, T3FREE, THYROIDAB in the last 72 hours. Anemia Panel: No results for input(s): VITAMINB12, FOLATE, FERRITIN, TIBC, IRON, RETICCTPCT in the last 72 hours. Urine analysis:    Component Value Date/Time   COLORURINE YELLOW 04/29/2018 1822   APPEARANCEUR CLEAR 04/29/2018 1822   LABSPEC 1.036 (H) 04/29/2018 1822   PHURINE 5.0 04/29/2018 1822   GLUCOSEU >=500 (A) 04/29/2018  1822   HGBUR NEGATIVE 04/29/2018 1822   BILIRUBINUR NEGATIVE 04/29/2018 1822   KETONESUR 5 (A) 04/29/2018 1822   PROTEINUR NEGATIVE 04/29/2018 1822   UROBILINOGEN 0.2 11/12/2013 0407   NITRITE NEGATIVE 04/29/2018 1822   LEUKOCYTESUR NEGATIVE 04/29/2018 1822   Sepsis Labs: (procalcitonin:4,lacticidven:4)  )No results found for this or any previous visit (from the past 240 hour(s)).    Studies: Ct Angio Head W Or Wo Contrast  Result Date: 04/30/2018 CLINICAL DATA:  Stroke follow-up.  Left-sided weakness EXAM: CT ANGIOGRAPHY HEAD AND NECK TECHNIQUE: Multidetector CT imaging of the head and neck was performed using the standard protocol during bolus administration of intravenous contrast. Multiplanar CT image reconstructions and MIPs were obtained to evaluate the vascular anatomy. Carotid stenosis measurements (when applicable) are obtained utilizing NASCET criteria, using the distal internal carotid diameter as the denominator. CONTRAST:  50mL ISOVUE-370 IOPAMIDOL (ISOVUE-370) INJECTION 76% COMPARISON:  Head CT  from yesterday. Report from catheter angiogram in 2014 FINDINGS: CTA NECK FINDINGS Aortic arch: Possible atheromatous wall thickening. Right carotid system: Possible atheromatous wall thickening. No stenosis or ulceration. Comparatively small right ICA in the setting of aplastic right A1 segment. Left carotid system: Possible atheromatous wall thickening. No ulceration or stenosis. Vertebral arteries: No proximal subclavian stenosis. Mild left vertebral artery dominance. Borderline right vertebral origin stenosis. 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 Skeleton: Spondylosis and asymmetric left degenerative facet spurring. Other neck: Negative Upper chest: Negative Review of the MIP images confirms the above findings CTA HEAD FINDINGS Anterior circulation: Aplastic right A1 segment. Large bilateral posterior communicating arteries. Mild presumably atheromatous irregularity of  bilateral M2 vessels. No proximal flow limiting stenosis. No branch occlusion. Negative for aneurysm. Posterior circulation: Symmetric vertebral arteries. Mild to moderate narrowing of the distal left V4 segment. The basilar is smooth and widely patent. 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 Mild atheromatous narrowing of the left proximal PCA. No branch occlusion flow limiting stenosis. Venous sinuses: Patent Anatomic variants: As above Delayed phase: No abnormal intracranial enhancement. Bilateral perforator infarcts and chronic small vessel ischemic change in the cerebral white matter that could be aged by MRI. Small remote appearing right upper cerebellar infarct. Review of the MIP images confirms the above findings IMPRESSION: 1. No emergent finding. 2. Overall mild atherosclerosis without branch occlusion or proximal flow limiting stenosis. Atheromatous irregularity primarily affects medium size vessels. Electronically Signed   By: Marnee Spring M.D.   On: 04/30/2018 11:51   Ct Angio Neck W Or Wo Contrast  Result Date: 04/30/2018 CLINICAL DATA:  Stroke follow-up.  Left-sided weakness EXAM: CT ANGIOGRAPHY HEAD AND NECK TECHNIQUE: Multidetector CT imaging of the head and neck was performed using the standard protocol during bolus administration of intravenous contrast. Multiplanar CT image reconstructions and MIPs were obtained to evaluate the vascular anatomy. Carotid stenosis measurements (when applicable) are obtained utilizing NASCET criteria, using the distal internal carotid diameter as the denominator. CONTRAST:  50mL ISOVUE-370 IOPAMIDOL (ISOVUE-370) INJECTION 76% COMPARISON:  Head CT from yesterday. Report from catheter angiogram in 2014 FINDINGS: CTA NECK FINDINGS Aortic arch: Possible atheromatous wall thickening. Right carotid system: Possible atheromatous wall thickening. No stenosis or ulceration. Comparatively small right ICA in the setting of aplastic right A1 segment. Left  carotid system: Possible atheromatous wall thickening. No ulceration or stenosis. Vertebral arteries: No proximal subclavian stenosis. Mild left vertebral artery dominance. Borderline right vertebral origin stenosis. 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 Skeleton: Spondylosis and asymmetric left degenerative facet spurring. Other neck: Negative Upper chest: Negative Review of the MIP images confirms the above findings CTA HEAD FINDINGS Anterior circulation: Aplastic right A1 segment. Large bilateral posterior communicating arteries. Mild presumably atheromatous irregularity of bilateral M2 vessels. No proximal flow limiting stenosis. No branch occlusion. Negative for aneurysm. Posterior circulation: Symmetric vertebral arteries. Mild to moderate narrowing of the distal left V4 segment. The basilar is smooth and widely patent. 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 Mild atheromatous narrowing of the left proximal PCA. No branch occlusion flow limiting stenosis. Venous sinuses: Patent Anatomic variants: As above Delayed phase: No abnormal intracranial enhancement. Bilateral perforator infarcts and chronic small vessel ischemic change in the cerebral white matter that could be aged by MRI. Small remote appearing right upper cerebellar infarct. Review of the MIP images confirms the above findings IMPRESSION: 1. No emergent finding. 2. Overall mild atherosclerosis without branch occlusion or proximal flow limiting  stenosis. Atheromatous irregularity primarily affects medium size vessels. Electronically Signed   By: Marnee Spring M.D.   On: 04/30/2018 11:51   Mr Brain Wo Contrast  Result Date: 04/30/2018 CLINICAL DATA:  62 y/o M; left lower extremity weakness since Friday. EXAM: MRI HEAD WITHOUT CONTRAST TECHNIQUE: Multiplanar, multiecho pulse sequences of the brain and surrounding structures were obtained without intravenous contrast. COMPARISON:  04/30/2018 CTA head and neck. FINDINGS:  Brain: 9 mm focus of reduced diffusion in the right posterior corona radiata additional subcentimeter focus in right posterior insula compatible with acute/early subacute infarction. No associated hemorrhage or mass effect. Multiple small chronic infarctions are present involving the bilateral anterior basal ganglia, right posterior lentiform nucleus, and the right cerebellar hemisphere. The chronic infarctions within the left anterior basal ganglia and right posterior lentiform nucleus demonstrate hemosiderin stain. Patchy nonspecific foci of T2 FLAIR hyperintense signal abnormality in subcortical and periventricular white matter are compatible with moderate to severe chronic microvascular ischemic changes for age. Moderate brain parenchymal volume loss. No extra-axial collection, hydrocephalus, or herniation. Vascular: Normal flow voids. Skull and upper cervical spine: Normal marrow signal. Sinuses/Orbits: Negative. Other: None. IMPRESSION: 1. Subcentimeter acute/early subacute infarctions within the right posterior corona radiata and the right posterior insula. No associated hemorrhage or mass effect. 2. Moderate to severe chronic microvascular ischemic changes and moderate parenchymal volume loss of the brain for age. 3. Small chronic infarctions within the bilateral anterior basal ganglia, right posterior lentiform nucleus, and right cerebellar hemisphere. These results will be called to the ordering clinician or representative by the Radiologist Assistant, and communication documented in the PACS or zVision Dashboard. Electronically Signed   By: Mitzi Hansen M.D.   On: 04/30/2018 18:57    Scheduled Meds: . aspirin EC  81 mg Oral Daily  . atorvastatin  40 mg Oral q1800  . clopidogrel  75 mg Oral Daily  . enoxaparin (LOVENOX) injection  40 mg Subcutaneous Q24H  . insulin aspart  0-20 Units Subcutaneous TID WC  . insulin glargine  15 Units Subcutaneous QHS    Continuous Infusions: .  sodium chloride Stopped (05/01/18 0949)     LOS: 2 days     Darlin Drop, MD [facility] Pager [phone]  If 7PM-7AM, please contact night-coverage [email or web address] Password [email or web address] 05/01/2018, 10:12 AM

## 2018-05-01 NOTE — Progress Notes (Addendum)
Physical Therapy Treatment Patient Details Name: Benjamin Cohen MRN: [id number] DOB: December 29, 1955 Today's Date: 05/01/2018    History of Present Illness Pt is a 62 y.o. male admitted 04/29/18 for evaluation of left-sided weakness that started on Friday 5/17; NIHSS=3. Pt does report previous CVA 3 years ago that affected L-side, but no residual weakness (per chart, was R cerebral stroke involving right caudate and corona radiata; was given TPA). CT shows no acute intracranial abnormality; probable remote infarcts within bilateral basal ganglia. MRI revealed R posterior corona radiata and posterior insular infarct. Mod to severe Small vessel disease. Atrophy.     PT Comments    Pt making progress towards his goals, however is still limited in his safe mobility by L-sided weakness and decreased attention to L sided movement. Pt currently mod I for bed mobility , supervision for transfers and min guard for ambulation of 350 feet without AD. Pt worked on increased balance with gait and progressing to reciprocal arm swing to aid in dynamic balance. D/c recommendations remain appropriate. PT will continue to follow acutely.   Follow Up Recommendations  Outpatient PT;Supervision - Intermittent(Outpatient neuro PT/OT)     Equipment Recommendations  None recommended by PT    Recommendations for Other Services       Precautions / Restrictions Precautions Precautions: Fall Restrictions Weight Bearing Restrictions: No    Mobility  Bed Mobility Overal bed mobility: Modified Independent                Transfers Overall transfer level: Needs assistance   Transfers: Sit to/from Stand Sit to Stand: Supervision         General transfer comment: Supervision for safety due to LLE weakness/incoordination; intermittent cues for safety  Ambulation/Gait Ambulation/Gait assistance: Supervision Ambulation Distance (Feet): 350 Feet Assistive device: None Gait Pattern/deviations: Step-through  pattern;Decreased stride length;Decreased dorsiflexion - left;Decreased weight shift to right Gait velocity: Decreased Gait velocity interpretation: 1.31 - 2.62 ft/sec, indicative of limited community ambulator General Gait Details: min guard for safety, maximal vc for attention to precise movement of L UE and LE, to insure proper dorsiflexion to clear L LE in swing and to initiate reciprocal UE and LE movement to improve overall balance   Modified Rankin (Stroke Patients Only) Modified Rankin (Stroke Patients Only) Pre-Morbid Rankin Score: No symptoms Modified Rankin: Slight disability     Balance Overall balance assessment: Needs assistance   Sitting balance-Leahy Scale: Good       Standing balance-Leahy Scale: Good       Tandem Stance - Right Leg: 0 Tandem Stance - Left Leg: 2 Rhomberg - Eyes Opened: 30 Rhomberg - Eyes Closed: 30     Standardized Balance Assessment Standardized Balance Assessment : Dynamic Gait Index          Cognition Arousal/Alertness: Awake/alert Behavior During Therapy: WFL for tasks assessed/performed Overall Cognitive Status: Impaired/Different from baseline Area of Impairment: Attention;Awareness;Safety/judgement                   Current Attention Level: Selective     Safety/Judgement: Decreased awareness of deficits;Decreased awareness of safety Awareness: Emergent   General Comments: Pt requires vc for attention to L side for mobility             Pertinent Vitals/Pain Pain Assessment: No/denies pain           PT Goals (current goals can now be found in the care plan section) Acute Rehab PT Goals Patient Stated Goal: Return home PT Goal Formulation:  With patient Time For Goal Achievement: 05/14/18 Potential to Achieve Goals: Good Progress towards PT goals: Progressing toward goals    Frequency    Min 4X/week      PT Plan Current plan remains appropriate    Co-evaluation              AM-PAC PT "6  Clicks" Daily Activity  Outcome Measure  Difficulty turning over in bed (including adjusting bedclothes, sheets and blankets)?: None Difficulty moving from lying on back to sitting on the side of the bed? : None Difficulty sitting down on and standing up from a chair with arms (e.g., wheelchair, bedside commode, etc,.)?: None Help needed moving to and from a bed to chair (including a wheelchair)?: A Little Help needed walking in hospital room?: A Little Help needed climbing 3-5 steps with a railing? : A Little 6 Click Score: 21    End of Session Equipment Utilized During Treatment: Gait belt Activity Tolerance: Patient tolerated treatment well Patient left: in bed;with call bell/phone within reach;with nursing/sitter in room Nurse Communication: Mobility status PT Visit Diagnosis: Other abnormalities of gait and mobility (R26.89)     Time: 1610-9604 PT Time Calculation (min) (ACUTE ONLY): 14 min  Charges:  $Gait Training: 8-22 mins                    G Codes:       Breia Ocampo B. Beverely Risen PT, DPT Acute Rehabilitation  [phone] Pager [phone]     Elon Alas Fleet 05/01/2018, 5:13 PM

## 2018-05-01 NOTE — Progress Notes (Signed)
OT Note - Addendum    05/01/18 1700  OT Visit Information  Last OT Received On 05/01/18  Acute Rehab OT Goals  Patient Stated Goal Return home  OT Time Calculation  OT Start Time (ACUTE ONLY) 0856  OT Stop Time (ACUTE ONLY) 0943  OT Time Calculation (min) 47 min  OT General Charges  $OT Visit 1 Visit  OT Treatments  $Self Care/Home Management  23-37 mins  $Neuromuscular Re-education 8-22 mins  Luisa Dago, OT/L  OT Clinical Specialist [phone]

## 2018-05-02 ENCOUNTER — Other Ambulatory Visit: Payer: Self-pay | Admitting: Medical

## 2018-05-02 DIAGNOSIS — I6300 Cerebral infarction due to thrombosis of unspecified precerebral artery: Secondary | ICD-10-CM

## 2018-05-02 LAB — GLUCOSE, CAPILLARY
Glucose-Capillary: 165 mg/dL — ABNORMAL HIGH (ref 65–99)
Glucose-Capillary: 205 mg/dL — ABNORMAL HIGH (ref 65–99)

## 2018-05-02 MED ORDER — METFORMIN HCL 1000 MG PO TABS: 1000.0000 mg | ORAL_TABLET | Freq: Two times a day (BID) | ORAL | 0 refills | Status: DC

## 2018-05-02 MED ORDER — GLIMEPIRIDE 1 MG PO TABS: 1.0000 mg | ORAL_TABLET | ORAL | 0 refills | Status: DC

## 2018-05-02 MED ORDER — CLOPIDOGREL BISULFATE 75 MG PO TABS: 75.0000 mg | ORAL_TABLET | Freq: Every day | ORAL | 0 refills | Status: DC

## 2018-05-02 MED ORDER — BLOOD GLUCOSE METER KIT: PACK | 0 refills | Status: DC

## 2018-05-02 MED ORDER — INSULIN GLARGINE 100 UNIT/ML ~~LOC~~ SOLN: 15.0000 [IU] | Freq: Every day | SUBCUTANEOUS | 0 refills | Status: DC

## 2018-05-02 MED ORDER — ATORVASTATIN CALCIUM 40 MG PO TABS: 40.0000 mg | ORAL_TABLET | Freq: Every day | ORAL | 0 refills | Status: DC

## 2018-05-02 MED ORDER — ASPIRIN 81 MG PO TBEC: 81.0000 mg | DELAYED_RELEASE_TABLET | Freq: Every day | ORAL | 0 refills | Status: DC

## 2018-05-02 NOTE — Discharge Instructions (Signed)
Hospital Discharge After a Stroke  Being discharged from the hospital after a stroke can feel overwhelming. Many things may be different, and it is normal to feel scared or anxious. Some stroke survivors may be able to return to their homes, and others may need more specialized care on a temporary or permanent basis. Your stroke care team will work with you to develop a discharge plan that is best for you. Ask questions if you do not understand something. Invite a friend or family member to participate in discharge planning. Understanding and following your discharge plan can help to prevent another stroke or other problems. Understanding your medicines After a stroke, your health care provider may prescribe one or more types of medicine. It is important to take medicines exactly as told by your health care provider. Serious harm, such as another stroke, can happen if you are unable to take your medicine exactly as prescribed. Make sure you understand:  What medicine to take.  Why you are taking the medicine.  How and when to take it.  If it can be taken with your other medicines and herbal supplements.  Possible side effects.  When to call your health care provider if you have any side effects.  How you will get and pay for your medicines. Medical assistance programs may be able to help you pay for prescription medicines if you cannot afford them.  If you are taking an anticoagulant, be sure to take it exactly as told by your health care provider. This type of medicine can increase the risk of bleeding because it works to prevent blood from clotting. You may need to take certain precautions to prevent bleeding. You should contact your health care provider if you have:  Bleeding or bruising.  A fall or other injury to your head.  Blood in your urine or stool (feces).  Planning for home safety Take steps to prevent falls, such as installing grab bars or using a shower chair. Ask a friend  or family member to get needed things in place before you go home if possible. A therapist can come to your home to make recommendations for safety equipment. Ask your health care provider if you would benefit from this service or from home care. Getting needed equipment Ask your health care provider for a list of any medical equipment and supplies you will need at home. These may include items such as:  Walkers.  Canes.  Wheelchairs.  Hand-strengthening devices.  Special eating utensils.  Medical equipment can be rented or purchased, depending on your insurance coverage. Check with your insurance company about what is covered. Keeping follow-up visits After a stroke, you will need to follow up regularly with a health care provider. You may also need rehabilitation, which can include physical therapy, occupational therapy, or speech-language therapy. Keeping these appointments is very important to your recovery after a stroke. Be sure to bring your medicine list and discharge papers with you to your appointments. If you need help to keep track of your schedule, use a calendar or appointment reminder. Preventing another stroke Having a stroke puts you at risk for another stroke in the future. Ask your health care provider what actions you can take to lower the risk. These may include:  Increasing how much you exercise.  Making a healthy eating plan.  Quitting smoking.  Managing other health conditions, such as high blood pressure, high cholesterol, or diabetes.  Limiting alcohol use.  Knowing the warning signs of a stroke  Make sure you understand the signs of a stroke. Before you leave the hospital, you will receive information outlining the stroke warning signs. Share these with your friends and family members. "BE FAST" is an easy way to remember the main warning signs of a stroke:  B - Balance. Signs are dizziness, sudden trouble walking, or loss of balance.  E - Eyes. Signs  are trouble seeing or a sudden change in vision.  F - Face. Signs are sudden weakness or numbness of the face, or the face or eyelid drooping on one side.  A - Arms. Signs are weakness or numbness in an arm. This happens suddenly and usually on one side of the body.  S - Speech. Signs are sudden trouble speaking, slurred speech, or trouble understanding what people say.  T - Time. Time to call emergency services. Write down what time symptoms started.  Other signs of stroke may include:  A sudden, severe headache with no known cause.  Nausea or vomiting.  Seizure.  These symptoms may represent a serious problem that is an emergency. Do not wait to see if the symptoms will go away. Get medical help right away. Call your local emergency services (911 in the [place] Do not drive yourself to the hospital. Make note of the time that you had your first symptoms. Your emergency responders or emergency room staff will need to know this information. Summary  Being discharged from the hospital after a stroke can feel overwhelming. It is normal to feel scared or anxious.  Make sure you take medicines exactly as told by your health care provider.  Know the warning signs of a stroke, and get help right way if you have any of these symptoms. "BE FAST" is an easy way to remember the main warning signs of a stroke. This information is not intended to replace advice given to you by your health care provider. Make sure you discuss any questions you have with your health care provider. Document Released: 03/02/2017 Document Revised: 03/02/2017 Document Reviewed: 03/02/2017 Elsevier Interactive Patient Education  2018 [employer].   Ischemic Stroke An ischemic stroke is the sudden death of brain tissue. Blood carries oxygen to all areas of the body. This type of stroke happens when your blood does not flow to your brain like normal. Your brain cannot get the oxygen it needs. This is an emergency. It  must be treated right away. Symptoms of a stroke usually happen all of a sudden. You may notice them when you wake up. They can include:  Weakness or loss of feeling in your face, arm, or leg. This often happens on one side of the body.  Trouble walking.  Trouble moving your arms or legs.  Loss of balance or coordination.  Feeling confused.  Trouble talking or understanding what people are saying.  Slurred speech.  Trouble seeing.  Seeing two of one object (double vision).  Feeling dizzy.  Feeling sick to your stomach (nauseous) and throwing up (vomiting).  A very bad headache for no reason.  Get help as soon as any of these problems start. This is important. Some treatments work better if they are given right away. These include:  Aspirin.  Medicines to control blood pressure.  A shot (injection) of medicine to break up the blood clot.  Treatments given in the blood vessel (artery) to take out the clot or break it up.  Other treatments may include:  Oxygen.  Fluids given through an IV tube.  Medicines to thin out your blood.  Procedures to help your blood flow better.  What increases the risk? Certain things may make you more likely to have a stroke. Some of these are things that you can change, such as:  Being very overweight (obesity).  Smoking.  Taking birth control pills.  Not being active.  Drinking too much alcohol.  Using drugs.  Other risk factors include:  High blood pressure.  High cholesterol.  Diabetes.  Heart disease.  Being [place] American, Native American, Hispanic, or [place] Native.  Being over age 95.  Family history of stroke.  Having had blood clots, stroke, or warning stroke (transient ischemic attack, TIA) in the past.  Sickle cell disease.  Being a woman with a history of high blood pressure in pregnancy (preeclampsia).  Migraine headache.  Sleep apnea.  Having an irregular heartbeat (atrial  fibrillation).  Long-term (chronic) diseases that cause soreness and swelling (inflammation).  Disorders that affect how your blood clots.  Follow these instructions at home: Medicines  Take over-the-counter and prescription medicines only as told by your doctor.  If you were told to take aspirin or another medicine to thin your blood, take it exactly as told by your doctor. ? Taking too much of the medicine can cause bleeding. ? If you do not take enough, it may not work as well.  Know the side effects of your medicines. If you are taking a blood thinner, make sure you: ? Hold pressure over any cuts for longer than usual. ? Tell your dentist and other doctors that you take this medicine. ? Avoid activities that may cause damage or injury to your body. Eating and drinking  Follow instructions from your doctor about what you cannot eat or drink.  Eat healthy foods.  If you have trouble with swallowing, do these things to avoid choking: ? Take small bites when eating. ? Eat foods that are soft or pureed. Safety  Follow instructions from your health care team about physical activity.  Use a walker or cane as told by your doctor.  Keep your home safe so you do not fall. This may include: ? Having experts look at your home to make sure it is safe. ? Putting grab bars in the bedroom and bathroom. ? Using raised toilets. ? Putting a seat in the shower. General instructions  Do not use any tobacco products. ? Examples of these are cigarettes, chewing tobacco, and e-cigarettes. ? If you need help quitting, ask your doctor.  Limit how much alcohol you drink. This means no more than 1 drink a day for nonpregnant women and 2 drinks a day for men. One drink equals 12 oz of beer, 5 oz of wine, or 1 oz of hard liquor.  If you need help to stop using drugs or alcohol, ask your doctor to refer you to a program or specialist.  Stay active. Exercise as told by your doctor.  Keep all  follow-up visits as told by your doctor. This is important. Get help right away if:  You suddenly: ? Have weakness or loss of feeling in your face, arm, or leg. ? Feel confused. ? Have trouble talking or understanding what people are saying. ? Have trouble seeing. ? Have trouble walking. ? Have trouble moving your arms or legs. ? Feel dizzy. ? Lose your balance or coordination. ? Have a very bad headache and you do not know why.  You pass out (lose consciousness) or almost pass out.  You  have jerky movements that you cannot control (seizure). These symptoms may be an emergency. Do not wait to see if the symptoms will go away. Get medical help right away. Call your local emergency services (911 in the [place] Do not drive yourself to the hospital. This information is not intended to replace advice given to you by your health care provider. Make sure you discuss any questions you have with your health care provider. Document Released: 11/16/2011 Document Revised: 05/09/2016 Document Reviewed: 02/23/2016 Elsevier Interactive Patient Education  2018 [employer].   Type 2 Diabetes Mellitus, Self Care, Adult When you have type 2 diabetes (type 2 diabetes mellitus), you must keep your blood sugar (glucose) under control. You can do this with:  Nutrition.  Exercise.  Lifestyle changes.  Medicines or insulin, if needed.  Support from your doctors and others.  How do I manage my blood sugar?  Check your blood sugar level every day, as often as told.  Call your doctor if your blood sugar is above your goal numbers for 2 tests in a row.  Have your A1c (hemoglobin A1c) level checked at least two times a year. Have it checked more often if your doctor tells you to. Your doctor will set treatment goals for you. Generally, you should have these blood sugar levels:  Before meals (preprandial): 80-130 mg/dL (4.4-7.2 mmol/L).  After meals (postprandial): lower than 180 mg/dL (10  mmol/L).  A1c level: less than 7%.  What do I need to know about high blood sugar? High blood sugar is called hyperglycemia. Know the signs of high blood sugar. Signs may include:  Feeling: ? Thirsty. ? Hungry. ? Very tired.  Needing to pee (urinate) more than usual.  Blurry vision.  What do I need to know about low blood sugar? Low blood sugar is called hypoglycemia. This is when blood sugar is at or below 70 mg/dL (3.9 mmol/L). Symptoms may include:  Feeling: ? Hungry. ? Worried or nervous (anxious). ? Sweaty and clammy. ? Confused. ? Dizzy. ? Sleepy. ? Sick to your stomach (nauseous).  Having: ? A fast heartbeat (palpitations). ? A headache. ? A change in your vision. ? Jerky movements that you cannot control (seizure). ? Nightmares. ? Tingling or no feeling (numbness) around the mouth, lips, or tongue.  Having trouble with: ? Talking. ? Paying attention (concentrating). ? Moving (coordination). ? Sleeping.  Shaking.  Passing out (fainting).  Getting upset easily (irritability).  Treating low blood sugar  To treat low blood sugar, eat or drink something sugary right away. If you can think clearly and swallow safely, follow the 15:15 rule:  Take 15 grams of a fast-acting carb (carbohydrate). Some fast-acting carbs are: ? 1 tube of glucose gel. ? 3 sugar tablets (glucose pills). ? 6-8 pieces of hard candy. ? 4 oz (120 mL) of fruit juice. ? 4 oz (120 mL) regular (not diet) soda.  Check your blood sugar 15 minutes after you take the carb.  If your blood sugar is still at or below 70 mg/dL (3.9 mmol/L), take 15 grams of a carb again.  If your blood sugar does not go above 70 mg/dL (3.9 mmol/L) after 3 tries, get help right away.  After your blood sugar goes back to normal, eat a meal or a snack within 1 hour.  Treating very low blood sugar If your blood sugar is at or below 54 mg/dL (3 mmol/L), you have very low blood sugar (severe hypoglycemia). This  is an emergency. Do  not wait to see if the symptoms will go away. Get medical help right away. Call your local emergency services (911 in the [place] Do not drive yourself to the hospital. If you have very low blood sugar and you cannot eat or drink, you may need a glucagon shot (injection). A family member or friend should learn how to check your blood sugar and how to give you a glucagon shot. Ask your doctor if you need to have a glucagon shot kit at home. What else is important to manage my diabetes? Medicine Follow these instructions about insulin and diabetes medicines:  Take them as told by your doctor.  Adjust them as told by your doctor.  Do not run out of them.  Having diabetes can raise your risk for other long-term conditions. These include heart or kidney disease. Your doctor may prescribe medicines to help prevent problems from diabetes. Food   Make healthy food choices. These include: ? Chicken, fish, egg whites, and beans. ? Oats, whole wheat, bulgur, brown rice, quinoa, and millet. ? Fresh fruits and vegetables. ? Low-fat dairy products. ? Nuts, avocado, olive oil, and canola oil.  Make a food plan with a specialist (dietitian).  Follow instructions from your doctor about what you cannot eat or drink.  Drink enough fluid to keep your pee (urine) clear or pale yellow.  Eat healthy snacks between healthy meals.  Keep track of carbs that you eat. Read food labels. Learn food serving sizes.  Follow your sick day plan when you cannot eat or drink normally. Make this plan with your doctor so it is ready to use. Activity  Exercise at least 3 times a week.  Do not go more than 2 days without exercising.  Talk with your doctor before you start a new exercise. Your doctor may need to adjust your insulin, medicines, or food. Lifestyle   Do not use any tobacco products. These include cigarettes, chewing tobacco, and e-cigarettes.If you need help quitting, ask your  doctor.  Ask your doctor how much alcohol is safe for you.  Learn to deal with stress. If you need help with this, ask your doctor. Body care  Stay up to date with your shots (immunizations).  Have your eyes and feet checked by a doctor as often as told.  Check your skin and feet every day. Check for cuts, bruises, redness, blisters, or sores.  Brush your teeth and gums two times a day.  Floss at least one time a day.  Go to the dentist least one time every 6 months.  Stay at a healthy weight. General instructions   Take over-the-counter and prescription medicines only as told by your doctor.  Share your diabetes care plan with: ? Your work or school. ? People you live with.  Check your pee (urine) for ketones: ? When you are sick. ? As told by your doctor.  Carry a card or wear jewelry that says that you have diabetes.  Ask your doctor: ? Do I need to meet with a diabetes educator? ? Where can I find a support group for people with diabetes?  Keep all follow-up visits as told by your doctor. This is important. Where to find more information: To learn more about diabetes, visit:  American Diabetes Association: www.diabetes.org  American Association of Diabetes Educators: www.diabeteseducator.org/patient-resources  This information is not intended to replace advice given to you by your health care provider. Make sure you discuss any questions you have with your health care  provider. Document Released: 03/20/2016 Document Revised: 05/04/2016 Document Reviewed: 12/31/2015 Elsevier Interactive Patient Education  2018 [employer].   Stroke Prevention Some health problems and behaviors may make it more likely for you to have a stroke. Below are ways to lessen your risk of having a stroke.  Be active for at least 30 minutes on most or all days.  Do not smoke. Try not to be around others who smoke.  Do not drink too much alcohol. ? Do not have more than 2 drinks  a day if you are a man. ? Do not have more than 1 drink a day if you are a woman and are not pregnant.  Eat healthy foods, such as fruits and vegetables. If you were put on a specific diet, follow the diet as told.  Keep your cholesterol levels under control through diet and medicines. Look for foods that are low in saturated fat, trans fat, cholesterol, and are high in fiber.  If you have diabetes, follow all diet plans and take your medicine as told.  Ask your doctor if you need treatment to lower your blood pressure. If you have high blood pressure (hypertension), follow all diet plans and take your medicine as told by your doctor.  If you are 22-51 years old, have your blood pressure checked every 3-5 years. If you are age 22 or older, have your blood pressure checked every year.  Keep a healthy weight. Eat foods that are low in calories, salt, saturated fat, trans fat, and cholesterol.  Do not take drugs.  Avoid birth control pills, if this applies. Talk to your doctor about the risks of taking birth control pills.  Talk to your doctor if you have sleep problems (sleep apnea).  Take all medicine as told by your doctor. ? You may be told to take aspirin or blood thinner medicine. Take this medicine as told by your doctor. ? Understand your medicine instructions.  Make sure any other conditions you have are being taken care of.  Get help right away if:  You suddenly lose feeling (you feel numb) or have weakness in your face, arm, or leg.  Your face or eyelid hangs down to one side.  You suddenly feel confused.  You have trouble talking (aphasia) or understanding what people are saying.  You suddenly have trouble seeing in one or both eyes.  You suddenly have trouble walking.  You are dizzy.  You lose your balance or your movements are clumsy (uncoordinated).  You suddenly have a very bad headache and you do not know the cause.  You have new chest pain.  Your heart  feels like it is fluttering or skipping a beat (irregular heartbeat). Do not wait to see if the symptoms above go away. Get help right away. Call your local emergency services (911 in [place] Do not drive yourself to the hospital. This information is not intended to replace advice given to you by your health care provider. Make sure you discuss any questions you have with your health care provider. Document Released: 05/28/2012 Document Revised: 05/04/2016 Document Reviewed: 05/30/2013 Elsevier Interactive Patient Education  [employer].

## 2018-05-02 NOTE — Progress Notes (Signed)
Inpatient Diabetes Program Recommendations  AACE/ADA: New Consensus Statement on Inpatient Glycemic Control (2015)  Target Ranges:  Prepandial:   less than 140 mg/dL      Peak postprandial:   less than 180 mg/dL (1-2 hours)      Critically ill patients:  140 - 180 mg/dL   Results for Benjamin Cohen, Benjamin Cohen (MRN [id number]) as of 05/02/2018 10:32  Ref. Range 05/01/2018 08:03 05/01/2018 12:11 05/01/2018 17:08 05/01/2018 21:46 05/02/2018 08:11  Glucose-Capillary Latest Ref Range: 65 - 99 mg/dL 409 (H) 811 (H) 914 (H) 207 (H) 165 (H)   Review of Glycemic Control  Diabetes history:DM2 Outpatient Diabetes medications:None Current orders for Inpatient glycemic control:Lantus 15 units QHS, Novolog 0-15 units TID with meals, Novolog 0-5 units QHS, Novolog 4 units TID with meals  Inpatient Diabetes Program Recommendations: Insulin-Meal Coverage: Please consider increasing meal coverage to Novolog 7 units TID with meals. HgbA1C: A1C 11.5% on 5/21/19indicating an average glucose of 283 mg/dl over the past 2-3 months. Discharge DM medications: Anticipate patient will need simplified DM medication regimen to ensure compliance. Recommend discharging patient on Lantus 15 units QHS, Metformin 1000 mg BID, and Amaryl 1 mg daily and have patient follow up with PCP within 1 week.  Thanks, Orlando Penner, RN, MSN, CDE Diabetes Coordinator Inpatient Diabetes Program [phone] (Team Pager from 8am to 5pm)

## 2018-05-02 NOTE — Progress Notes (Signed)
Physical Therapy Treatment Patient Details Name: Benjamin Cohen MRN: [id number] DOB: Sep 20, 1956 Today's Date: 05/02/2018    History of Present Illness Pt is a 62 y.o. male admitted 04/29/18 for evaluation of left-sided weakness that started on Friday 5/17; NIHSS=3. Pt does report previous CVA 3 years ago that affected L-side, but no residual weakness (per chart, was R cerebral stroke involving right caudate and corona radiata; was given TPA). CT shows no acute intracranial abnormality; probable remote infarcts within bilateral basal ganglia. MRI revealed R posterior corona radiata and posterior insular infarct; mod to severe small vessel disease.    PT Comments    Pt progressing well with mobility. Ambulating hallway distances independently. Educ on LE therex secondary to c/o continued LLE "tightness" which improved with stretching. Improved ability to multitask, although still demonstrating some attentional deficits. Continue to recommend Outpatient neuro PT to maximize functional mobility in order to return to PLOF.   Follow Up Recommendations  Outpatient PT;Supervision - Intermittent(Outpatient neuro PT/OT)     Equipment Recommendations  None recommended by PT    Recommendations for Other Services       Precautions / Restrictions Precautions Precautions: None Restrictions Weight Bearing Restrictions: No    Mobility  Bed Mobility Overal bed mobility: Independent                Transfers Overall transfer level: Independent   Transfers: Sit to/from Stand              Ambulation/Gait Ambulation/Gait assistance: Independent Ambulation Distance (Feet): 500 Feet Assistive device: None Gait Pattern/deviations: Step-through pattern;Decreased stride length;Decreased dorsiflexion - left;Decreased weight shift to right Gait velocity: Decreased Gait velocity interpretation: 1.31 - 2.62 ft/sec, indicative of limited community Nurse, children's Rankin (Stroke Patients Only) Modified Rankin (Stroke Patients Only) Pre-Morbid Rankin Score: No symptoms Modified Rankin: Slight disability     Balance Overall balance assessment: Needs assistance   Sitting balance-Leahy Scale: Good       Standing balance-Leahy Scale: Good                   Standardized Balance Assessment Standardized Balance Assessment : Dynamic Gait Index   Dynamic Gait Index Level Surface: Mild Impairment Change in Gait Speed: Mild Impairment Gait with Horizontal Head Turns: Normal Gait with Vertical Head Turns: Normal Gait and Pivot Turn: Normal Step Over Obstacle: Mild Impairment Step Around Obstacles: Normal Steps: Normal Total Score: 21      Cognition Arousal/Alertness: Awake/alert Behavior During Therapy: WFL for tasks assessed/performed Overall Cognitive Status: Impaired/Different from baseline Area of Impairment: Attention;Awareness                   Current Attention Level: Selective       Awareness: Emergent          Exercises      General Comments        Pertinent Vitals/Pain Pain Assessment: No/denies pain    Home Living                      Prior Function            PT Goals (current goals can now be found in the care plan section) Acute Rehab PT Goals Patient Stated Goal: Return home PT Goal Formulation: With patient Time For Goal Achievement: 05/14/18 Potential to Achieve Goals: Good Progress towards  PT goals: Goals met/education completed, patient discharged from PT    Frequency    Min 4X/week      PT Plan Current plan remains appropriate    Co-evaluation              AM-PAC PT "6 Clicks" Daily Activity  Outcome Measure  Difficulty turning over in bed (including adjusting bedclothes, sheets and blankets)?: None Difficulty moving from lying on back to sitting on the side of the bed? : None Difficulty sitting down on and  standing up from a chair with arms (e.g., wheelchair, bedside commode, etc,.)?: None Help needed moving to and from a bed to chair (including a wheelchair)?: None Help needed walking in hospital room?: None Help needed climbing 3-5 steps with a railing? : A Little 6 Click Score: 23    End of Session Equipment Utilized During Treatment: Gait belt Activity Tolerance: Patient tolerated treatment well Patient left: (in hallway with NT) Nurse Communication: Mobility status PT Visit Diagnosis: Other abnormalities of gait and mobility (R26.89)     Time: 5329-9242 PT Time Calculation (min) (ACUTE ONLY): 24 min  Charges:  $Gait Training: 8-22 mins $Therapeutic Exercise: 8-22 mins                    G Codes:      Mabeline Caras, PT, DPT Acute Rehab Services  Pager: [place] 05/02/2018, 2:07 PM

## 2018-05-02 NOTE — Discharge Summary (Signed)
Discharge Summary  Benjamin Cohen XYI:[id number] DOB: 12/05/1956  PCP: Patient, No Pcp Per  Admit date: 04/29/2018 Discharge date: 05/02/2018  Time spent: 25 minutes  Recommendations for Outpatient Follow-up:  1. Follow-up with PCP 2. Follow-up with neurology 3. Take your medications as prescribed 4. Fall precautions 5. Do not drive or operate any machinery until cleared by neurology.  Discharge Diagnoses:  Active Hospital Problems   Diagnosis Date Noted  . Cerebral infarction ([place]) 11/12/2013  . Noncompliance   . History of stroke   . Cerebral thrombosis with cerebral infarction 04/29/2018  . CVA (cerebral vascular accident) ([place]) 04/29/2018  . HLD (hyperlipidemia) 01/04/2015  . Diabetes ([place]) 11/14/2013  . Hypertensive urgency 11/12/2013    Resolved Hospital Problems  No resolved problems to display.    Discharge Condition: Stable  Diet recommendation: Resume previous diet  Vitals:   05/01/18 2011 05/02/18 0549  BP: (!) 155/121 (!) 157/90  Pulse: 98 66  Resp: 18 16  Temp: 99.5 F (37.5 C) 97.8 F (36.6 C)  SpO2:  97%    History of present illness:  62 year old male with history of essential hypertension, previous CVA in 2014without any residual symptoms, diabetes mellitus type 2 but not taking any home medications comes to the hospital with complains of left lower extremity weakness which started about 4 days ago. He thought it would get better at first but due to failure resolution of his symptoms he came to the ER for further evaluation. In the ER he had CT of the head done which did not show any acute findings but did show a remote infarct and by lateral basilar ganglia. Neurology was consulted and patient was admitted for further work-up.  05/01/2018 patient seen and examined at bedside.  Reports generalized weakness while working with physical therapy.  No other complaints.  MRI brain abnormal.  Personally reviewed and revealed acute/early subacute  infarctions within the right posterior corona radiator in the right posterior insula.  05/02/2018: Patient seen and examined at his bedside.  Has no new complaints.   On the day of discharge patient was hemodynamically stable.  He will need to follow-up with PCP, neurology, continue physical therapy, and speech pathology services.    Hospital Course:  Principal Problem:   Cerebral infarction [facility]) Active Problems:   Hypertensive urgency   Diabetes ([place])   HLD (hyperlipidemia)   Cerebral thrombosis with cerebral infarction   CVA (cerebral vascular accident) ([place])   Noncompliance   History of stroke  Acute/early subacute right posterior corona radiator and right posterior insula infarcts MRI brain without contrast done on 04/30/2018 Stroke team following.  Appreciate recommendations. Hemoglobin A1c 11.5 goal of 7.0 or less LDL 137 goal LDL less than 70. 2D echo revealed LVEF 45% with no sign of cardiac source for emboli Permissive hypertension in place Continue statin Continue diabetes control Continue aspirin and Plavix Continue PT Speech recommends continued speech-language pathology services Needs 30-day cardiac event monitoring.  To rule out A. fib.  Cardiology consulted to set up.  History of CVA with acute early subacute infarcts Management as stated above  DM2, uncontrolled with hyperglycemia Discharged on Lantus 15 units daily, metformin 1000 mg twice daily and Amaryl 1 mg daily Close follow-up with primary care provider within a week  Hyperlipidemia LDL 137 Goal LDL less than 70 Continue statin  Chronic systolic CHF 2D echo done on 04/30/2018 revealed LVEF 45 to 50% with diffuse hypokinesis Last 2D echo done on 11/12/2013 revealed normal left ventricle function  with no diffuse hypokinesis. Grade 1 dysfunction   Procedures:  None  Consultations:  Diabetes coordinator  Discharge Exam: BP (!) 157/90 (BP Location: Left Arm)   Pulse 66   Temp 97.8 F  (36.6 C) (Oral)   Resp 16   Ht 5' 8" (1.727 m)   Wt 95.7 kg (210 lb 15.7 oz)   SpO2 97%   BMI 32.08 kg/m  . General: 62 y.o. year-old male well developed well nourished in no acute distress.  Alert and oriented x3. . Cardiovascular: Regular rate and rhythm with no rubs or gallops.  No thyromegaly or JVD noted.   Marland Kitchen Respiratory: Clear to auscultation with no wheezes or rales. Good inspiratory effort. . Abdomen: Soft nontender nondistended with normal bowel sounds x4 quadrants. . Musculoskeletal: Left upper extremity 3 out of 5 strength.  Left lower extremity 4 out of 5 strength.  No lower extremity edema. 2/4 pulses in all 4 extremities. . Skin: No ulcerative lesions noted or rashes, . Psychiatry: Mood is appropriate for condition and setting  Discharge Instructions You were cared for by a hospitalist during your hospital stay. If you have any questions about your discharge medications or the care you received while you were in the hospital after you are discharged, you can call the unit and asked to speak with the hospitalist on call if the hospitalist that took care of you is not available. Once you are discharged, your primary care physician will handle any further medical issues. Please note that NO REFILLS for any discharge medications will be authorized once you are discharged, as it is imperative that you return to your primary care physician (or establish a relationship with a primary care physician if you do not have one) for your aftercare needs so that they can reassess your need for medications and monitor your lab values.  Discharge Instructions    Ambulatory referral to Neurology   Complete by:  As directed    Follow up with stroke clinic NP (Jessica Vanschaick or Cecille Rubin, if both not available, consider Dr. Antony Contras, Dr. Bess Harvest, or Dr. Sarina Ill) at [facility] in about 4 weeks.   Ambulatory referral to Nutrition and Diabetic Education    Complete by:  As directed    Ambulatory referral to Occupational Therapy   Complete by:  As directed    Ambulatory referral to Physical Therapy   Complete by:  As directed      Allergies as of 05/02/2018   No Known Allergies     Medication List    TAKE these medications   aspirin 81 MG EC tablet Take 1 tablet (81 mg total) by mouth daily. Start taking on:  05/03/2018 What changed:    medication strength  how much to take   atorvastatin 40 MG tablet Commonly known as:  LIPITOR Take 1 tablet (40 mg total) by mouth daily at 6 PM.   blood glucose meter kit and supplies Dispense based on patient and insurance preference. Use up to four times daily as directed. (FOR ICD-10 E10.9, E11.9).   clopidogrel 75 MG tablet Commonly known as:  PLAVIX Take 1 tablet (75 mg total) by mouth daily. Start taking on:  05/03/2018   glimepiride 1 MG tablet Commonly known as:  AMARYL Take 1 tablet (1 mg total) by mouth every morning.   insulin glargine 100 UNIT/ML injection Commonly known as:  LANTUS Inject 0.15 mLs (15 Units total) into the skin at bedtime.   metFORMIN 1000  MG tablet Commonly known as:  GLUCOPHAGE Take 1 tablet (1,000 mg total) by mouth 2 (two) times daily with a meal.      No Known Allergies Follow-up Information    [facility] Follow up on 05/17/2018.   Why:   hospital follow uo scheduled for  05/17/2018 at 2:45 pm with Maude Leriche NP Contact information: [phone]       [facility] Follow up in 4 week(s).   Specialty:  Neurology Why:  stroke clinic. office will call with appt date and time. Contact information: [place] [phone]       [place] Office Follow up on 05/09/2018.   Specialty:  Cardiology Why:  Please arrive 15 minutes for your 12:30pm appointment to pick up your 30-day heart monitor device Contact information: [address]  [place], [place]. Call.   Why:  please call to make appointment. Contact information: [address] [place] [address] [phone]            The results of significant diagnostics from this hospitalization (including imaging, microbiology, ancillary and laboratory) are listed below for reference.    Significant Diagnostic Studies: Ct Angio Head W Or Wo Contrast  Result Date: 04/30/2018 CLINICAL DATA:  Stroke follow-up.  Left-sided weakness EXAM: CT ANGIOGRAPHY HEAD AND NECK TECHNIQUE: Multidetector CT imaging of the head and neck was performed using the standard protocol during bolus administration of intravenous contrast. Multiplanar CT image reconstructions and MIPs were obtained to evaluate the vascular anatomy. Carotid stenosis measurements (when applicable) are obtained utilizing NASCET criteria, using the distal internal carotid diameter as the denominator. CONTRAST:  32m ISOVUE-370 IOPAMIDOL (ISOVUE-370) INJECTION 76% COMPARISON:  Head CT from yesterday. Report from catheter angiogram in 2014 FINDINGS: CTA NECK FINDINGS Aortic arch: Possible atheromatous wall thickening. Right carotid system: Possible atheromatous wall thickening. No stenosis or ulceration. Comparatively small right ICA in the setting of aplastic right A1 segment. Left carotid system: Possible atheromatous wall thickening. No ulceration or stenosis. Vertebral arteries: No proximal subclavian stenosis. Mild left vertebral artery dominance. Borderline right vertebral origin stenosis. 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 Skeleton: Spondylosis and asymmetric left degenerative facet spurring. Other neck: Negative Upper chest: Negative Review of the MIP images confirms the above findings CTA HEAD FINDINGS Anterior circulation: Aplastic right A1 segment. Large bilateral posterior communicating arteries. Mild presumably atheromatous  irregularity of bilateral M2 vessels. No proximal flow limiting stenosis. No branch occlusion. Negative for aneurysm. Posterior circulation: Symmetric vertebral arteries. Mild to moderate narrowing of the distal left V4 segment. The basilar is smooth and widely patent. 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 Mild atheromatous narrowing of the left proximal PCA. No branch occlusion flow limiting stenosis. Venous sinuses: Patent Anatomic variants: As above Delayed phase: No abnormal intracranial enhancement. Bilateral perforator infarcts and chronic small vessel ischemic change in the cerebral white matter that could be aged by MRI. Small remote appearing right upper cerebellar infarct. Review of the MIP images confirms the above findings IMPRESSION: 1. No emergent finding. 2. Overall mild atherosclerosis without branch occlusion or proximal flow limiting stenosis. Atheromatous irregularity primarily affects medium size vessels. Electronically Signed   By: JMonte FantasiaM.D.   On: 04/30/2018 11:51   Ct Head Wo Contrast  Result Date: 04/29/2018 CLINICAL DATA:  Left upper and lower extremity  weakness for 2 days EXAM: CT HEAD WITHOUT CONTRAST TECHNIQUE: Contiguous axial images were obtained from the base of the skull through the vertex without intravenous contrast. COMPARISON:  None. FINDINGS: Brain: No acute territorial infarction, hemorrhage or intracranial mass. Mild to moderate small vessel ischemic changes of the white matter. Probable remote infarcts in the bilateral basal ganglia. Nonenlarged ventricles Vascular: No hyperdense vessels. Scattered calcifications at the carotid siphons. Skull: Normal. Negative for fracture or focal lesion. Sinuses/Orbits: No acute finding. Other: None IMPRESSION: 1. No definite CT evidence for acute intracranial abnormality. 2. Probable remote infarcts within the bilateral basal ganglia. Moderate small vessel ischemic changes of the white matter. Electronically  Signed   By: Donavan Foil M.D.   On: 04/29/2018 18:45   Ct Angio Neck W Or Wo Contrast  Result Date: 04/30/2018 CLINICAL DATA:  Stroke follow-up.  Left-sided weakness EXAM: CT ANGIOGRAPHY HEAD AND NECK TECHNIQUE: Multidetector CT imaging of the head and neck was performed using the standard protocol during bolus administration of intravenous contrast. Multiplanar CT image reconstructions and MIPs were obtained to evaluate the vascular anatomy. Carotid stenosis measurements (when applicable) are obtained utilizing NASCET criteria, using the distal internal carotid diameter as the denominator. CONTRAST:  23m ISOVUE-370 IOPAMIDOL (ISOVUE-370) INJECTION 76% COMPARISON:  Head CT from yesterday. Report from catheter angiogram in 2014 FINDINGS: CTA NECK FINDINGS Aortic arch: Possible atheromatous wall thickening. Right carotid system: Possible atheromatous wall thickening. No stenosis or ulceration. Comparatively small right ICA in the setting of aplastic right A1 segment. Left carotid system: Possible atheromatous wall thickening. No ulceration or stenosis. Vertebral arteries: No proximal subclavian stenosis. Mild left vertebral artery dominance. Borderline right vertebral origin stenosis. 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 Skeleton: Spondylosis and asymmetric left degenerative facet spurring. Other neck: Negative Upper chest: Negative Review of the MIP images confirms the above findings CTA HEAD FINDINGS Anterior circulation: Aplastic right A1 segment. Large bilateral posterior communicating arteries. Mild presumably atheromatous irregularity of bilateral M2 vessels. No proximal flow limiting stenosis. No branch occlusion. Negative for aneurysm. Posterior circulation: Symmetric vertebral arteries. Mild to moderate narrowing of the distal left V4 segment. The basilar is smooth and widely patent. 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 Mild atheromatous narrowing of the left proximal PCA. No  branch occlusion flow limiting stenosis. Venous sinuses: Patent Anatomic variants: As above Delayed phase: No abnormal intracranial enhancement. Bilateral perforator infarcts and chronic small vessel ischemic change in the cerebral white matter that could be aged by MRI. Small remote appearing right upper cerebellar infarct. Review of the MIP images confirms the above findings IMPRESSION: 1. No emergent finding. 2. Overall mild atherosclerosis without branch occlusion or proximal flow limiting stenosis. Atheromatous irregularity primarily affects medium size vessels. Electronically Signed   By: JMonte FantasiaM.D.   On: 04/30/2018 11:51   Mr Brain Wo Contrast  Result Date: 04/30/2018 CLINICAL DATA:  62y/o M; left lower extremity weakness since Friday. EXAM: MRI HEAD WITHOUT CONTRAST TECHNIQUE: Multiplanar, multiecho pulse sequences of the brain and surrounding structures were obtained without intravenous contrast. COMPARISON:  04/30/2018 CTA head and neck. FINDINGS: Brain: 9 mm focus of reduced diffusion in the right posterior corona radiata additional subcentimeter focus in right posterior insula compatible with acute/early subacute infarction. No associated hemorrhage or mass effect. Multiple small chronic infarctions are present involving the bilateral anterior basal ganglia, right posterior lentiform nucleus, and the right cerebellar hemisphere. The chronic infarctions within the left anterior basal ganglia and right posterior  lentiform nucleus demonstrate hemosiderin stain. Patchy nonspecific foci of T2 FLAIR hyperintense signal abnormality in subcortical and periventricular white matter are compatible with moderate to severe chronic microvascular ischemic changes for age. Moderate brain parenchymal volume loss. No extra-axial collection, hydrocephalus, or herniation. Vascular: Normal flow voids. Skull and upper cervical spine: Normal marrow signal. Sinuses/Orbits: Negative. Other: None. IMPRESSION: 1.  Subcentimeter acute/early subacute infarctions within the right posterior corona radiata and the right posterior insula. No associated hemorrhage or mass effect. 2. Moderate to severe chronic microvascular ischemic changes and moderate parenchymal volume loss of the brain for age. 3. Small chronic infarctions within the bilateral anterior basal ganglia, right posterior lentiform nucleus, and right cerebellar hemisphere. These results will be called to the ordering clinician or representative by the Radiologist Assistant, and communication documented in the PACS or zVision Dashboard. Electronically Signed   By: Kristine Garbe M.D.   On: 04/30/2018 18:57    Microbiology: No results found for this or any previous visit (from the past 240 hour(s)).   Labs: Basic Metabolic Panel: Recent Labs  Lab 04/29/18 1732 04/29/18 1755 04/30/18 0128  NA 138 139 140  K 3.6 3.6 3.3*  CL 103 102 105  CO2 23  --  26  GLUCOSE 351* 359* 332*  BUN _0 CREATININE 1.11 0.90 1.07  1.09  CALCIUM 8.7*  --  8.6*   Liver Function Tests: Recent Labs  Lab 04/29/18 1732 04/30/18 0128  AST 19 15  ALT 16* 14*  ALKPHOS 100 89  BILITOT 0.7 1.1  PROT 7.0 6.6  ALBUMIN 3.7 3.4*   No results for input(s): LIPASE, AMYLASE in the last 168 hours. No results for input(s): AMMONIA in the last 168 hours. CBC: Recent Labs  Lab 04/29/18 1732 04/29/18 1755 04/30/18 0128  WBC 4.7  --  5.6  NEUTROABS 2.6  --   --   HGB 14.2 15.0 13.7  HCT 44.4 44.0 42.8  MCV 83.0  --  82.9  PLT 221  --  207   Cardiac Enzymes: No results for input(s): CKTOTAL, CKMB, CKMBINDEX, TROPONINI in the last 168 hours. BNP: BNP (last 3 results) No results for input(s): BNP in the last 8760 hours.  ProBNP (last 3 results) No results for input(s): PROBNP in the last 8760 hours.  CBG: Recent Labs  Lab 05/01/18 0803 05/01/18 1211 05/01/18 1708 05/01/18 2146 05/02/18 0811  GLUCAP 187* 257* 164* 207* 165*        Signed:  Kayleen Memos, MD [facility] 05/02/2018, 11:55 AM

## 2018-05-07 NOTE — Progress Notes (Addendum)
Actual note time is 05/07/2018 @ 1630; Epic will not let this writer change the time. Pt. Presented to the 3W nurse's station with concerns about his diabetes. Pt. Stated his nurse Danny discharged him and he had questions about his discharge instructions. At that time this writer thought patient was discharged from 3W but after assisting him realized he had actually been d/c'd from 102[facility]. Dannielle Huh is a Engineer, civil (consulting) on 3W.)  Pt. Stated that he had not checked his blood sugar since Thursday and did not know how to get the supplies to do this. Pt. Had paperwork but did not realize he had a prescription for his diabetes supplies with it. Explained to the patient that he needed to go to his pharmacy and get the prescription filled immediately today. Talked to the patient and discussed signs of hyperglycemia and he denied excessive urination, drinking or hunger. D/W pt. That if he did not feel good that we could take him to the ED since he hasn't been checking his blood sugar. Pt. Refused. This Clinical research associate offered again to take pt. To the ER at the conclusion of the conversation and Mr. Claiborne refused.

## 2018-05-16 ENCOUNTER — Other Ambulatory Visit: Payer: Self-pay

## 2018-05-16 NOTE — Patient Outreach (Signed)
[facility] Care Management  05/16/2018  Benjamin Cohen 1956/10/16 [id number]   EMMI- Stroke RED ON EMMI ALERT Day # 13 Date: 6/65/19 Red Alert Reason:  Benjamin FlesherWent to follow up appointment? No  Outreach attempt # 1 Telephone call to patient.  Phone rang and rang them said call rejected.  Unable to leave a message.  Plan: RN CM will outreach within 4 business days.    Benjamin Lericheionne J Raea Magallon, RN, MSN [facility] Care [facility] Care Management Coordinator Direct Line [phone] Toll Free: [phone]  Fax: [phone]

## 2018-05-17 ENCOUNTER — Other Ambulatory Visit: Payer: 59

## 2018-05-17 NOTE — Patient Outreach (Signed)
[facility]) Care Management  05/17/2018  [name] 10-14-1956 [id number]   EMMI- Stroke RED ON EMMI ALERT Day # 13 Date:6/65/19 Red Alert Reason: Micah FlesherWent to follow up appointment? No  Outreach attempt #2 Telephone call to patient.  Phone rang and rang them said call rejected.  Unable to leave a message.  Plan: Benjamin Cohen CM will outreach within 4 business days  Benjamin Lericheionne J Kunal Levario, Benjamin Cohen, Benjamin Cohen [facility] Care Management Care Management Coordinator Direct Line [phone] Toll Free: [phone]  Fax: [phone]

## 2018-05-20 ENCOUNTER — Other Ambulatory Visit: Payer: Self-pay

## 2018-05-20 NOTE — Patient Outreach (Signed)
[facility]) Care Management  05/20/2018  Geri Seminoledward W Waheed 1956-02-20 [id number]   EMMI-Stroke RED ON EMMI ALERT Day #13 Date:6/65/19 Red Alert Reason: Micah FlesherWent to follow up appointment? No  Outreach attempt #3 Telephone call to patient. Phone rang and rang them said call rejected. Unable to leave a message.  Plan: RN CM will wait for return call.  If no return call will close case.  Bary Lericheionne J Brycin Kille, RN, MSN [facility] Care Management Care Management Coordinator Direct Line [phone] Toll Free: [phone]  Fax: [phone]

## 2018-05-21 ENCOUNTER — Ambulatory Visit: Payer: 59

## 2018-05-27 ENCOUNTER — Telehealth: Payer: Self-pay | Admitting: Adult Health

## 2018-05-27 ENCOUNTER — Ambulatory Visit: Payer: 59 | Admitting: Adult Health

## 2018-05-27 ENCOUNTER — Encounter: Payer: Self-pay | Admitting: Adult Health

## 2018-05-27 VITALS — BP 157/101 | HR 84 | Ht 66.5 in | Wt 210.0 lb

## 2018-05-27 DIAGNOSIS — E1159 Type 2 diabetes mellitus with other circulatory complications: Secondary | ICD-10-CM | POA: Diagnosis not present

## 2018-05-27 DIAGNOSIS — E782 Mixed hyperlipidemia: Secondary | ICD-10-CM

## 2018-05-27 DIAGNOSIS — I63511 Cerebral infarction due to unspecified occlusion or stenosis of right middle cerebral artery: Secondary | ICD-10-CM

## 2018-05-27 DIAGNOSIS — I10 Essential (primary) hypertension: Secondary | ICD-10-CM

## 2018-05-27 DIAGNOSIS — Z8673 Personal history of transient ischemic attack (TIA), and cerebral infarction without residual deficits: Secondary | ICD-10-CM | POA: Diagnosis not present

## 2018-05-27 NOTE — Patient Instructions (Addendum)
Continue clopidogrel 75 mg daily  and lipitor  for secondary stroke prevention  Stop aspirin 81mg  as you have completed 3 weeks of dual antiplatelet therapy  Continue to follow up with PCP regarding cholesterol, blood pressure and diabetes management   You are cleared to return to work and we will notify your primary care doctor   Continue to monitor blood pressure at home  Ensure you continue to take all medications as prescribed  Maintain strict control of hypertension with blood pressure goal below 130/90, diabetes with hemoglobin A1c goal below 6.5% and cholesterol with LDL cholesterol (bad cholesterol) goal below 70 mg/dL. I also advised the patient to eat a healthy diet with plenty of whole grains, cereals, fruits and vegetables, exercise regularly and maintain ideal body weight.  Followup in the future with me in 4 months or call earlier if needed        [facility]. I hope we have been able to provide you high quality care today.  You may receive a patient satisfaction survey over the next few weeks. We would appreciate your feedback and comments so that we may continue to improve ourselves and the health of our patients.

## 2018-05-27 NOTE — Progress Notes (Signed)
I agree with the above plan 

## 2018-05-27 NOTE — Progress Notes (Signed)
[facility] [address] [place]. [place] [address] [phone]       OFFICE FOLLOW UP NOTE  [name] Date of Birth:  [birth year] Medical Record Number:  [id number]   Reason for Referral:  hospital stroke follow up  CHIEF COMPLAINT:  Chief Complaint  Patient presents with  . New Patient (Initial Visit)    hospital follow up. Patient reports that he is doing very well.     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 History obtained from patient and chart review. Reviewed all radiology images and labs personally.  Benjamin Cohen is a 62 year old male with history of hypertension, diabetes, hx of stroke  who was admitted for left-sided weakness. CT showed old bilateral BG infarcts. CTA head and neck unremarkable. EF 45 to 50%. MRI showed right CR and insular cortex small infarcts.LDL 137 and recommended Lipitor 40 mg.  A1c 11.5 recommended tight glycemic control with close PCP follow-up. Recommend aspirin 81 and Plavix for 3 weeks and then Plavix alone.Medication compliance was reinforced as patient was seen here in the office in the past after previous stroke in 2016 and does have a history of medication noncompliance. Although stroke most likely small vessel etiology, due to insular involvement, cardioembolic stroke can not be completely ruled out and a 30 day cardiac event monitoring to rule out afib as outpt.  Therapies recommended outpatient PT/OT and patient was discharged in stable condition.   Patient is being seen today for hospital follow-up.  He states he is doing well overall and denies left-sided weakness.  He states that he did not obtain any therapies at discharge.  He continues to take aspirin and Plavix without side effects of bleeding or bruising.  Continues to take atorvastatin without side effects of myalgias.  PCP did recent lab work on 05/17/2018 with LDL  29 and A1c 10.7.  Blood pressure mildly elevated at 157/101 and patient denies checking BP at home.  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  He states he is eager to go back to work and believes he will be L to do this without complications.  He does have some shifts that are scheduled from 1 PM to 9 PM and requesting to work the day shift from 9 to 5:30 PM to avoid driving at night as this has been difficult for him.  He did have a 30-day monitor ordered but has not been obtained and patient is unable to state why.  Advised patient that this will be worked into in order for patient to undergo monitoring and rule out atrial fibrillation.  Patient states he has been staying active and eating a healthy diet.  Prior to the stroke, he states he was drinking at least 3 [facility] daily but has since stopped drinking all soft sodas.  He also states that he has been compliant with medication regimen and understands how important this is for him to be compliant with now.  Denies new or worsening stroke/TIA symptoms.  ROS:   14 system review of systems performed and negative with exception of no complaints  PMH:  Past Medical History:  Diagnosis Date  . Hypertension   . Stroke [facility])     PSH:  Past Surgical History:  Procedure Laterality Date  . NO PAST SURGERIES  Social History:  Social History   Socioeconomic History  . Marital status: Single    Spouse name: Not on file  . Number of children: 3  . Years of education: 12th  . Highest education level: Not on file  Occupational History  . Occupation: N/A    Employer: [employer]  . Financial resource strain: Not on file  . Food insecurity:    Worry: Not on file    Inability: Not on file  . Transportation needs:    Medical: Not on file    Non-medical: Not on file  Tobacco Use  . Smoking status: Never Smoker  . Smokeless tobacco: Never  Used  Substance and Sexual Activity  . Alcohol use: No    Alcohol/week: 0.0 oz  . Drug use: No  . Sexual activity: Yes  Lifestyle  . Physical activity:    Days per week: Not on file    Minutes per session: Not on file  . Stress: Not on file  Relationships  . Social connections:    Talks on phone: Not on file    Gets together: Not on file    Attends religious service: Not on file    Active member of club or organization: Not on file    Attends meetings of clubs or organizations: Not on file    Relationship status: Not on file  . Intimate partner violence:    Fear of current or ex partner: Not on file    Emotionally abused: Not on file    Physically abused: Not on file    Forced sexual activity: Not on file  Other Topics Concern  . Not on file  Social History Narrative   Patient is single with 3 children.   Patient is right handed.   Patient has hs education.    Patient drinks 2 cups weekly.    Family History:  Family History  Problem Relation Age of Onset  . Cancer Mother   . Hypertension Brother     Medications:   Current Outpatient Medications on File Prior to Visit  Medication Sig Dispense Refill  . atorvastatin (LIPITOR) 40 MG tablet Take 1 tablet (40 mg total) by mouth daily at 6 PM. 30 tablet 0  . blood glucose meter kit and supplies Dispense based on patient and insurance preference. Use up to four times daily as directed. (FOR ICD-10 E10.9, E11.9). 1 each 0  . clopidogrel (PLAVIX) 75 MG tablet Take 1 tablet (75 mg total) by mouth daily. 30 tablet 0  . glimepiride (AMARYL) 1 MG tablet Take 1 tablet (1 mg total) by mouth every morning. 30 tablet 0  . insulin glargine (LANTUS) 100 UNIT/ML injection Inject 0.15 mLs (15 Units total) into the skin at bedtime. 10 mL 0  . metFORMIN (GLUCOPHAGE) 1000 MG tablet Take 1 tablet (1,000 mg total) by mouth 2 (two) times daily with a meal. 60 tablet 0   No current facility-administered medications on file prior to visit.      Allergies:  No Known Allergies   Physical Exam  Vitals:   05/27/18 0835  BP: (!) 157/101  Pulse: 84  Weight: 210 lb (95.3 kg)  Height: 5' 6.5" (1.689 m)   Body mass index is 33.39 kg/m. No exam data present  General: well developed, well nourished, pleasant Hispanic male, seated, in no evident distress Head: head normocephalic and atraumatic.   Neck: supple with no carotid or supraclavicular bruits Cardiovascular: regular rate and rhythm, no  murmurs Musculoskeletal: no deformity Skin:  no rash/petichiae Vascular:  Normal pulses all extremities  Neurologic Exam Mental Status: Awake and fully alert. Oriented to place and time. Recent and remote memory intact. Attention span, concentration and fund of knowledge appropriate. Mood and affect appropriate.  Cranial Nerves: Fundoscopic exam reveals sharp disc margins. Pupils equal, briskly reactive to light. Extraocular movements full without nystagmus. Visual fields full to confrontation. Hearing intact. Facial sensation intact. Face, tongue, palate moves normally and symmetrically.  Motor: Normal bulk and tone. Normal strength in all tested extremity muscles. Sensory.: intact to touch , pinprick , position and vibratory sensation.  Coordination: Rapid alternating movements normal in all extremities. Finger-to-nose and heel-to-shin performed accurately bilaterally.  Mildly decreased finger dexterity in left hand. Gait and Station: Arises from chair without difficulty. Stance is normal. Gait demonstrates normal stride length and balance . Able to heel, toe and tandem walk without difficulty.  Reflexes: 1+ and symmetric. Toes downgoing.    NIHSS  0 Modified Rankin  1   Diagnostic Data (Labs, Imaging, Testing)  CT HEAD WO CONTRAST 04/29/2018 IMPRESSION: 1. No definite CT evidence for acute intracranial abnormality. 2. Probable remote infarcts within the bilateral basal ganglia. Moderate small vessel ischemic changes of the  white matter.  CT ANGIO NECK W OR WO CONTRAST CT ANGIO HEAD W OR WO CONTRAST 04/30/2018 IMPRESSION: 1. No emergent finding. 2. Overall mild atherosclerosis without branch occlusion or proximal flow limiting stenosis. Atheromatous irregularity primarily affects medium size vessels.  MR BRAIN WO CONTRAST 04/30/2018 IMPRESSION: 1. Subcentimeter acute/early subacute infarctions within the right posterior corona radiata and the right posterior insula. No associated hemorrhage or mass effect. 2. Moderate to severe chronic microvascular ischemic changes and moderate parenchymal volume loss of the brain for age. 3. Small chronic infarctions within the bilateral anterior basal ganglia, right posterior lentiform nucleus, and right cerebellar hemisphere.  ECHO COMPLETE WO IMAGING ENHANCING AGENT 04/30/2018 Study Conclusions - Left ventricle: The cavity size was normal. Wall thickness was   increased in a pattern of mild LVH. Systolic function was mildly   reduced. The estimated ejection fraction was in the range of 45%   to 50%. Diffuse hypokinesis. Doppler parameters are consistent   with abnormal left ventricular relaxation (grade 1 diastolic   dysfunction). - Left atrium: The atrium was mildly dilated. - Tricuspid valve: There was mild regurgitation. Impressions: - No cardiac source of emboli was indentified.    ASSESSMENT: Benjamin Cohen is a 62 y.o. 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 Vascular risk factors include HTN, HLD.     PLAN: -Continue clopidogrel 75 mg daily  and atorvastatin for secondary stroke prevention -Stop aspirin 81 mg as a 3-week of DAPT therapy has been completed -Undergo 30-day cardiac event monitor monitoring -F/u with PCP regarding your HLD, HTN and DM management -Patient is cleared to return to work  full-time but do recommend limiting hours to daytime only to avoid driving at night at this time. -Reinforced medication compliance -continue to monitor BP at home -Maintain strict control of hypertension with blood pressure goal below 130/90, diabetes with hemoglobin A1c goal below 6.5% and cholesterol with LDL cholesterol (bad cholesterol) goal below 70 mg/dL. I also advised the patient to eat a healthy diet with plenty of whole grains, cereals, fruits and vegetables, exercise regularly and maintain ideal body weight.  Follow  up in 4 months or call earlier if needed   Greater than 50% of time during this 25 minute visit was spent on counseling,explanation of diagnosis of right CR insular cortex small infarcts, reviewing risk factor management of HLD, HTN and DM, planning of further management, discussion with patient and family and coordination of care    Venancio Poisson, [facility]  [facility] [address] [place], [place] [address]  Phone [phone] Fax [phone]

## 2018-05-27 NOTE — Telephone Encounter (Signed)
Called and left message for patient  And relayed that patient is scheduled for his    Cardiac event monitor   monitor is scheduled for this  Thursday arrive at 9:15 for 9:30 patient can call [phone] if this apt does not work for him thanks [email or web address] .

## 2018-05-28 ENCOUNTER — Ambulatory Visit: Payer: 59

## 2018-05-28 NOTE — Telephone Encounter (Signed)
Noted! Thank you

## 2018-05-30 ENCOUNTER — Telehealth: Payer: Self-pay

## 2018-05-30 ENCOUNTER — Ambulatory Visit (INDEPENDENT_AMBULATORY_CARE_PROVIDER_SITE_OTHER): Payer: 59

## 2018-05-30 ENCOUNTER — Other Ambulatory Visit: Payer: Self-pay

## 2018-05-30 ENCOUNTER — Other Ambulatory Visit: Payer: Self-pay | Admitting: Medical

## 2018-05-30 DIAGNOSIS — I6300 Cerebral infarction due to thrombosis of unspecified precerebral artery: Secondary | ICD-10-CM

## 2018-05-30 DIAGNOSIS — I4891 Unspecified atrial fibrillation: Secondary | ICD-10-CM | POA: Diagnosis not present

## 2018-05-30 NOTE — Telephone Encounter (Signed)
Fee paid of 50.00 dollars. Return to work form done by Principal FinancialJessica NP. Given to medical records.

## 2018-05-30 NOTE — Patient Outreach (Signed)
[facility] Care Management  05/30/2018  Geri Seminoledward W Yarbrough 03/26/1956 [id number]   Multiple attempts to establish contact with patient without success.    Plan: RN CM will close case at this time.   Bary Lericheionne J Makiya Jeune, RN, MSN [facility] Care Management Care Management Coordinator Direct Line [phone] Toll Free: [phone]  Fax: [phone]

## 2018-06-04 ENCOUNTER — Ambulatory Visit: Payer: 59 | Admitting: Adult Health

## 2018-06-04 ENCOUNTER — Ambulatory Visit: Payer: 59

## 2019-03-11 ENCOUNTER — Encounter (HOSPITAL_COMMUNITY): Payer: Self-pay | Admitting: Emergency Medicine

## 2019-03-11 ENCOUNTER — Inpatient Hospital Stay (HOSPITAL_COMMUNITY)
Admission: EM | Admit: 2019-03-11 | Discharge: 2019-04-11 | DRG: 207 | Disposition: A | Discharge status: Rehabilitation Facility | Payer: 59 | Attending: Internal Medicine | Admitting: Internal Medicine

## 2019-03-11 ENCOUNTER — Emergency Department (HOSPITAL_COMMUNITY): Payer: 59

## 2019-03-11 ENCOUNTER — Other Ambulatory Visit: Payer: Self-pay

## 2019-03-11 DIAGNOSIS — J1289 Other viral pneumonia: Secondary | ICD-10-CM | POA: Diagnosis present

## 2019-03-11 DIAGNOSIS — E785 Hyperlipidemia, unspecified: Secondary | ICD-10-CM | POA: Diagnosis present

## 2019-03-11 DIAGNOSIS — J1520 Pneumonia due to staphylococcus, unspecified: Secondary | ICD-10-CM | POA: Diagnosis not present

## 2019-03-11 DIAGNOSIS — R410 Disorientation, unspecified: Secondary | ICD-10-CM | POA: Diagnosis not present

## 2019-03-11 DIAGNOSIS — B370 Candidal stomatitis: Secondary | ICD-10-CM | POA: Diagnosis not present

## 2019-03-11 DIAGNOSIS — Z809 Family history of malignant neoplasm, unspecified: Secondary | ICD-10-CM | POA: Diagnosis not present

## 2019-03-11 DIAGNOSIS — Z8249 Family history of ischemic heart disease and other diseases of the circulatory system: Secondary | ICD-10-CM | POA: Diagnosis not present

## 2019-03-11 DIAGNOSIS — Z781 Physical restraint status: Secondary | ICD-10-CM | POA: Diagnosis not present

## 2019-03-11 DIAGNOSIS — J988 Other specified respiratory disorders: Secondary | ICD-10-CM

## 2019-03-11 DIAGNOSIS — Z9119 Patient's noncompliance with other medical treatment and regimen: Secondary | ICD-10-CM | POA: Diagnosis not present

## 2019-03-11 DIAGNOSIS — J9601 Acute respiratory failure with hypoxia: Secondary | ICD-10-CM | POA: Diagnosis present

## 2019-03-11 DIAGNOSIS — J069 Acute upper respiratory infection, unspecified: Secondary | ICD-10-CM | POA: Diagnosis not present

## 2019-03-11 DIAGNOSIS — R0902 Hypoxemia: Secondary | ICD-10-CM

## 2019-03-11 DIAGNOSIS — A419 Sepsis, unspecified organism: Secondary | ICD-10-CM | POA: Diagnosis not present

## 2019-03-11 DIAGNOSIS — G9341 Metabolic encephalopathy: Secondary | ICD-10-CM | POA: Diagnosis present

## 2019-03-11 DIAGNOSIS — R1310 Dysphagia, unspecified: Secondary | ICD-10-CM

## 2019-03-11 DIAGNOSIS — D6489 Other specified anemias: Secondary | ICD-10-CM | POA: Diagnosis not present

## 2019-03-11 DIAGNOSIS — Z7984 Long term (current) use of oral hypoglycemic drugs: Secondary | ICD-10-CM | POA: Diagnosis not present

## 2019-03-11 DIAGNOSIS — R4702 Dysphasia: Secondary | ICD-10-CM | POA: Diagnosis not present

## 2019-03-11 DIAGNOSIS — I639 Cerebral infarction, unspecified: Secondary | ICD-10-CM | POA: Diagnosis not present

## 2019-03-11 DIAGNOSIS — R918 Other nonspecific abnormal finding of lung field: Secondary | ICD-10-CM

## 2019-03-11 DIAGNOSIS — E871 Hypo-osmolality and hyponatremia: Secondary | ICD-10-CM | POA: Diagnosis not present

## 2019-03-11 DIAGNOSIS — E876 Hypokalemia: Secondary | ICD-10-CM | POA: Diagnosis present

## 2019-03-11 DIAGNOSIS — A4901 Methicillin susceptible Staphylococcus aureus infection, unspecified site: Secondary | ICD-10-CM | POA: Diagnosis not present

## 2019-03-11 DIAGNOSIS — E119 Type 2 diabetes mellitus without complications: Secondary | ICD-10-CM

## 2019-03-11 DIAGNOSIS — E873 Alkalosis: Secondary | ICD-10-CM | POA: Diagnosis not present

## 2019-03-11 DIAGNOSIS — R0603 Acute respiratory distress: Secondary | ICD-10-CM

## 2019-03-11 DIAGNOSIS — M19012 Primary osteoarthritis, left shoulder: Secondary | ICD-10-CM | POA: Diagnosis present

## 2019-03-11 DIAGNOSIS — Z794 Long term (current) use of insulin: Secondary | ICD-10-CM | POA: Diagnosis not present

## 2019-03-11 DIAGNOSIS — Z978 Presence of other specified devices: Secondary | ICD-10-CM

## 2019-03-11 DIAGNOSIS — Y95 Nosocomial condition: Secondary | ICD-10-CM | POA: Diagnosis not present

## 2019-03-11 DIAGNOSIS — E1165 Type 2 diabetes mellitus with hyperglycemia: Secondary | ICD-10-CM | POA: Diagnosis present

## 2019-03-11 DIAGNOSIS — R6521 Severe sepsis with septic shock: Secondary | ICD-10-CM | POA: Diagnosis not present

## 2019-03-11 DIAGNOSIS — N179 Acute kidney failure, unspecified: Secondary | ICD-10-CM | POA: Diagnosis present

## 2019-03-11 DIAGNOSIS — R0602 Shortness of breath: Secondary | ICD-10-CM

## 2019-03-11 DIAGNOSIS — Z4659 Encounter for fitting and adjustment of other gastrointestinal appliance and device: Secondary | ICD-10-CM

## 2019-03-11 DIAGNOSIS — Z9114 Patient's other noncompliance with medication regimen: Secondary | ICD-10-CM | POA: Diagnosis not present

## 2019-03-11 DIAGNOSIS — J80 Acute respiratory distress syndrome: Secondary | ICD-10-CM | POA: Diagnosis present

## 2019-03-11 DIAGNOSIS — R509 Fever, unspecified: Secondary | ICD-10-CM | POA: Diagnosis present

## 2019-03-11 DIAGNOSIS — Z8619 Personal history of other infectious and parasitic diseases: Secondary | ICD-10-CM | POA: Diagnosis not present

## 2019-03-11 DIAGNOSIS — I10 Essential (primary) hypertension: Secondary | ICD-10-CM | POA: Diagnosis present

## 2019-03-11 DIAGNOSIS — R6889 Other general symptoms and signs: Secondary | ICD-10-CM | POA: Diagnosis not present

## 2019-03-11 DIAGNOSIS — Z7902 Long term (current) use of antithrombotics/antiplatelets: Secondary | ICD-10-CM | POA: Diagnosis not present

## 2019-03-11 DIAGNOSIS — R0989 Other specified symptoms and signs involving the circulatory and respiratory systems: Secondary | ICD-10-CM | POA: Diagnosis not present

## 2019-03-11 DIAGNOSIS — E870 Hyperosmolality and hypernatremia: Secondary | ICD-10-CM | POA: Diagnosis not present

## 2019-03-11 DIAGNOSIS — R5381 Other malaise: Secondary | ICD-10-CM | POA: Diagnosis present

## 2019-03-11 DIAGNOSIS — J15212 Pneumonia due to Methicillin resistant Staphylococcus aureus: Secondary | ICD-10-CM | POA: Diagnosis not present

## 2019-03-11 DIAGNOSIS — L8990 Pressure ulcer of unspecified site, unspecified stage: Secondary | ICD-10-CM

## 2019-03-11 DIAGNOSIS — I69391 Dysphagia following cerebral infarction: Secondary | ICD-10-CM

## 2019-03-11 DIAGNOSIS — J9600 Acute respiratory failure, unspecified whether with hypoxia or hypercapnia: Secondary | ICD-10-CM

## 2019-03-11 DIAGNOSIS — E1159 Type 2 diabetes mellitus with other circulatory complications: Secondary | ICD-10-CM | POA: Diagnosis not present

## 2019-03-11 DIAGNOSIS — Z01818 Encounter for other preprocedural examination: Secondary | ICD-10-CM

## 2019-03-11 DIAGNOSIS — E782 Mixed hyperlipidemia: Secondary | ICD-10-CM | POA: Diagnosis not present

## 2019-03-11 DIAGNOSIS — R7309 Other abnormal glucose: Secondary | ICD-10-CM | POA: Diagnosis not present

## 2019-03-11 DIAGNOSIS — Z789 Other specified health status: Secondary | ICD-10-CM

## 2019-03-11 DIAGNOSIS — R1313 Dysphagia, pharyngeal phase: Secondary | ICD-10-CM | POA: Diagnosis not present

## 2019-03-11 DIAGNOSIS — Z0189 Encounter for other specified special examinations: Secondary | ICD-10-CM

## 2019-03-11 DIAGNOSIS — F05 Delirium due to known physiological condition: Secondary | ICD-10-CM | POA: Diagnosis present

## 2019-03-11 LAB — CBC WITH DIFFERENTIAL/PLATELET
Abs Immature Granulocytes: 0.03 10*3/uL (ref 0.00–0.07)
Basophils Absolute: 0 10*3/uL (ref 0.0–0.1)
Basophils Relative: 0 %
EOS ABS: 0.1 10*3/uL (ref 0.0–0.5)
Eosinophils Relative: 2 %
HCT: 47.7 % (ref 39.0–52.0)
Hemoglobin: 15.1 g/dL (ref 13.0–17.0)
Immature Granulocytes: 1 %
Lymphocytes Relative: 15 %
Lymphs Abs: 0.8 10*3/uL (ref 0.7–4.0)
MCH: 26.6 pg (ref 26.0–34.0)
MCHC: 31.7 g/dL (ref 30.0–36.0)
MCV: 84 fL (ref 80.0–100.0)
Monocytes Absolute: 0.2 10*3/uL (ref 0.1–1.0)
Monocytes Relative: 3 %
Neutro Abs: 4.2 10*3/uL (ref 1.7–7.7)
Neutrophils Relative %: 79 %
Platelets: 201 10*3/uL (ref 150–400)
RBC: 5.68 MIL/uL (ref 4.22–5.81)
RDW: 12.7 % (ref 11.5–15.5)
WBC MORPHOLOGY: INCREASED
WBC: 5.3 10*3/uL (ref 4.0–10.5)
nRBC: 0 % (ref 0.0–0.2)

## 2019-03-11 LAB — HEMOGLOBIN A1C
Hgb A1c MFr Bld: 12.4 % — ABNORMAL HIGH (ref 4.8–5.6)
Mean Plasma Glucose: 309.18 mg/dL

## 2019-03-11 LAB — URINALYSIS, ROUTINE W REFLEX MICROSCOPIC
Bacteria, UA: NONE SEEN
Bilirubin Urine: NEGATIVE
Glucose, UA: >=500 mg/dL — AB
Ketones, ur: 20 mg/dL — AB
Leukocytes,Ua: NEGATIVE
Nitrite: NEGATIVE
PROTEIN: 100 mg/dL — AB
Specific Gravity, Urine: 1.025 (ref 1.005–1.030)
pH: 5 (ref 5.0–8.0)

## 2019-03-11 LAB — FERRITIN: Ferritin: 488 ng/mL — ABNORMAL HIGH (ref 24–336)

## 2019-03-11 LAB — CK: Total CK: 322 U/L (ref 49–397)

## 2019-03-11 LAB — COMPREHENSIVE METABOLIC PANEL
ALK PHOS: 74 U/L (ref 38–126)
ALT: 25 U/L (ref 0–44)
AST: 41 U/L (ref 15–41)
Albumin: 3.3 g/dL — ABNORMAL LOW (ref 3.5–5.0)
Anion gap: 19 — ABNORMAL HIGH (ref 5–15)
BILIRUBIN TOTAL: 0.8 mg/dL (ref 0.3–1.2)
BUN: 21 mg/dL (ref 8–23)
CO2: 23 mmol/L (ref 22–32)
Calcium: 8.3 mg/dL — ABNORMAL LOW (ref 8.9–10.3)
Chloride: 95 mmol/L — ABNORMAL LOW (ref 98–111)
Creatinine, Ser: 1.42 mg/dL — ABNORMAL HIGH (ref 0.61–1.24)
GFR calc Af Amer: 60 mL/min — ABNORMAL LOW (ref >60)
GFR calc non Af Amer: 51 mL/min — ABNORMAL LOW (ref >60)
Glucose, Bld: 335 mg/dL — ABNORMAL HIGH (ref 70–99)
Potassium: 3.9 mmol/L (ref 3.5–5.1)
SODIUM: 137 mmol/L (ref 135–145)
TOTAL PROTEIN: 8 g/dL (ref 6.5–8.1)

## 2019-03-11 LAB — C-REACTIVE PROTEIN: CRP: 21.9 mg/dL — ABNORMAL HIGH (ref <1.0)

## 2019-03-11 LAB — LACTIC ACID, PLASMA
LACTIC ACID, VENOUS: 2.9 mmol/L — AB (ref 0.5–1.9)
Lactic Acid, Venous: 2 mmol/L (ref 0.5–1.9)

## 2019-03-11 LAB — PROCALCITONIN

## 2019-03-11 LAB — CBG MONITORING, ED: GLUCOSE-CAPILLARY: 208 mg/dL — AB (ref 70–99)

## 2019-03-11 LAB — LACTATE DEHYDROGENASE: LDH: 360 U/L — ABNORMAL HIGH (ref 98–192)

## 2019-03-11 LAB — D-DIMER, QUANTITATIVE: D-Dimer, Quant: 1.89 ug/mL-FEU — ABNORMAL HIGH (ref 0.00–0.50)

## 2019-03-11 LAB — MRSA PCR SCREENING: MRSA by PCR: NEGATIVE

## 2019-03-11 LAB — GLUCOSE, CAPILLARY: Glucose-Capillary: 252 mg/dL — ABNORMAL HIGH (ref 70–99)

## 2019-03-11 MED ORDER — ONDANSETRON HCL 4 MG PO TABS
4.0000 mg | ORAL_TABLET | Freq: Four times a day (QID) | ORAL | Status: DC | PRN
  Filled 2019-03-11: qty 1

## 2019-03-11 MED ORDER — OXYCODONE HCL 5 MG PO TABS: 5.0000 mg | ORAL_TABLET | ORAL | Status: DC | PRN

## 2019-03-11 MED ORDER — ONDANSETRON HCL 4 MG/2ML IJ SOLN: 4.0000 mg | Freq: Four times a day (QID) | INTRAMUSCULAR | Status: DC | PRN

## 2019-03-11 MED ORDER — INSULIN ASPART 100 UNIT/ML ~~LOC~~ SOLN
0.0000 [IU] | Freq: Three times a day (TID) | SUBCUTANEOUS | Status: DC
  Administered 2019-03-11 – 2019-03-12 (×3): 5 [IU] via SUBCUTANEOUS
  Administered 2019-03-12: 17:00:00 3 [IU] via SUBCUTANEOUS
  Filled 2019-03-11: qty 1

## 2019-03-11 MED ORDER — POLYETHYLENE GLYCOL 3350 17 G PO PACK
17.0000 g | PACK | Freq: Every day | ORAL | Status: DC | PRN
  Administered 2019-03-22: 06:00:00 17 g via ORAL
  Filled 2019-03-11: qty 1

## 2019-03-11 MED ORDER — DOCUSATE SODIUM 100 MG PO CAPS
100.0000 mg | ORAL_CAPSULE | Freq: Two times a day (BID) | ORAL | Status: DC
  Administered 2019-03-11 – 2019-03-12 (×2): 100 mg via ORAL
  Filled 2019-03-11 (×2): qty 1

## 2019-03-11 MED ORDER — ACETAMINOPHEN 325 MG PO TABS
650.0000 mg | ORAL_TABLET | Freq: Once | ORAL | Status: AC
  Administered 2019-03-11: 650 mg via ORAL
  Filled 2019-03-11: qty 2

## 2019-03-11 MED ORDER — HYDRALAZINE HCL 20 MG/ML IJ SOLN
10.0000 mg | Freq: Once | INTRAMUSCULAR | Status: AC
  Administered 2019-03-11: 10 mg via INTRAVENOUS
  Filled 2019-03-11: qty 1

## 2019-03-11 MED ORDER — SODIUM CHLORIDE 0.9 % IV BOLUS (SEPSIS)
500.0000 mL | Freq: Once | INTRAVENOUS | Status: AC
  Administered 2019-03-11: 500 mL via INTRAVENOUS

## 2019-03-11 MED ORDER — BISACODYL 5 MG PO TBEC
5.0000 mg | DELAYED_RELEASE_TABLET | Freq: Every day | ORAL | Status: DC | PRN
  Administered 2019-03-22: 06:00:00 5 mg via ORAL
  Filled 2019-03-11: qty 1

## 2019-03-11 MED ORDER — ACETAMINOPHEN 325 MG PO TABS
650.0000 mg | ORAL_TABLET | Freq: Four times a day (QID) | ORAL | Status: DC | PRN
  Administered 2019-03-11 – 2019-03-12 (×3): 650 mg via ORAL
  Filled 2019-03-11 (×3): qty 2

## 2019-03-11 MED ORDER — SODIUM CHLORIDE 0.9 % IV SOLN
500.0000 mg | INTRAVENOUS | Status: AC
  Administered 2019-03-11 – 2019-03-15 (×5): 500 mg via INTRAVENOUS
  Filled 2019-03-11 (×5): qty 500

## 2019-03-11 MED ORDER — INSULIN ASPART 100 UNIT/ML ~~LOC~~ SOLN
4.0000 [IU] | Freq: Once | SUBCUTANEOUS | Status: AC
  Administered 2019-03-11: 4 [IU] via SUBCUTANEOUS
  Filled 2019-03-11: qty 1

## 2019-03-11 MED ORDER — ENOXAPARIN SODIUM 40 MG/0.4ML ~~LOC~~ SOLN
40.0000 mg | SUBCUTANEOUS | Status: DC
  Administered 2019-03-11 – 2019-04-10 (×31): 40 mg via SUBCUTANEOUS
  Filled 2019-03-11 (×32): qty 0.4

## 2019-03-11 MED ORDER — SODIUM CHLORIDE 0.9 % IV SOLN
1000.0000 mL | INTRAVENOUS | Status: DC
  Administered 2019-03-11: 1000 mL via INTRAVENOUS

## 2019-03-11 MED ORDER — SODIUM CHLORIDE 0.9 % IV SOLN
2.0000 g | INTRAVENOUS | Status: AC
  Administered 2019-03-11 – 2019-03-15 (×5): 2 g via INTRAVENOUS
  Filled 2019-03-11 (×3): qty 20
  Filled 2019-03-11 (×3): qty 2
  Filled 2019-03-11: qty 20

## 2019-03-11 MED ORDER — SODIUM CHLORIDE 0.9% FLUSH
3.0000 mL | Freq: Two times a day (BID) | INTRAVENOUS | Status: DC
  Administered 2019-03-11 – 2019-04-01 (×35): 3 mL via INTRAVENOUS

## 2019-03-11 MED ORDER — INSULIN ASPART 100 UNIT/ML ~~LOC~~ SOLN
0.0000 [IU] | Freq: Every day | SUBCUTANEOUS | Status: DC
  Administered 2019-03-11: 3 [IU] via SUBCUTANEOUS

## 2019-03-11 NOTE — ED Provider Notes (Signed)
[place] DEPT Provider Note   CSN: [id number] Arrival date & time: 03/11/19  1136    History   Chief Complaint Chief Complaint  Patient presents with  . Altered Mental Status    HPI Benjamin Cohen is a 63 y.o. male.     HPI Patient presents to the emergency room for evaluation of weakness, cough and fever.  Patient went to a wedding on March 15 in [place].  Subsequently family members found out that there was a known individual at the wedding with coronavirus.  Patient apparently has been sitting on his floor at home for the last few days.  He has been too weak to stand who is been attempting to urinate and cups.  Has chest discomfort and a hoarse voice.  He is finding it hard to speak to me although he will answer and a few words.  He admits to coughing.  Complains of sore throat.  He has not been able to stand and feels weak all over.  No known vomiting or diarrhea. Past Medical History:  Diagnosis Date  . Hypertension   . Stroke [facility])     Patient Active Problem List   Diagnosis Date Noted  . Noncompliance   . History of stroke   . Cerebral thrombosis with cerebral infarction 04/29/2018  . CVA (cerebral vascular accident) ([place]) 04/29/2018  . HLD (hyperlipidemia) 01/04/2015  . Diabetes ([place]) 11/14/2013  . Other and unspecified hyperlipidemia 11/14/2013  . Cerebral infarction ([place]) 11/12/2013  . Hypertensive urgency 11/12/2013    Past Surgical History:  Procedure Laterality Date  . NO PAST SURGERIES          Home Medications    Prior to Admission medications   Medication Sig Start Date End Date Taking? Authorizing Provider  ibuprofen (ADVIL,MOTRIN) 200 MG tablet Take 200 mg by mouth every 6 (six) hours as needed for headache or moderate pain.   Yes [provider]  atorvastatin (LIPITOR) 40 MG tablet Take 1 tablet (40 mg total) by mouth daily at 6 PM. Patient not taking: Reported on 03/11/2019 05/02/18   Kayleen Memos, DO   blood glucose meter kit and supplies Dispense based on patient and insurance preference. Use up to four times daily as directed. (FOR ICD-10 E10.9, E11.9). 05/02/18   Kayleen Memos, DO  clopidogrel (PLAVIX) 75 MG tablet Take 1 tablet (75 mg total) by mouth daily. Patient not taking: Reported on 03/11/2019 05/03/18   Kayleen Memos, DO  glimepiride (AMARYL) 1 MG tablet Take 1 tablet (1 mg total) by mouth every morning. Patient not taking: Reported on 03/11/2019 05/02/18 05/02/19  Kayleen Memos, DO  insulin glargine (LANTUS) 100 UNIT/ML injection Inject 0.15 mLs (15 Units total) into the skin at bedtime. Patient not taking: Reported on 03/11/2019 05/02/18   Kayleen Memos, DO  metFORMIN (GLUCOPHAGE) 1000 MG tablet Take 1 tablet (1,000 mg total) by mouth 2 (two) times daily with a meal. Patient not taking: Reported on 03/11/2019 05/02/18 05/02/19  Kayleen Memos, DO    Family History Family History  Problem Relation Age of Onset  . Cancer Mother   . Hypertension Brother     Social History Social History   Tobacco Use  . Smoking status: Never Smoker  . Smokeless tobacco: Never Used  Substance Use Topics  . Alcohol use: No    Alcohol/week: 0.0 standard drinks  . Drug use: No     Allergies   Patient has no known  allergies.   Review of Systems Review of Systems  Unable to perform ROS: Acuity of condition     Physical Exam Updated Vital Signs BP (!) 143/119   Pulse (!) 102   Temp (!) 100.5 F (38.1 C) (Oral)   Resp (!) 37   Ht 1.702 m (_0 )   Wt 93.9 kg   SpO2 95%   BMI 32.42 kg/m   Physical Exam Vitals signs and nursing note reviewed.  Constitutional:      Appearance: He is well-developed. He is ill-appearing.  HENT:     Head: Normocephalic and atraumatic.     Right Ear: External ear normal.     Left Ear: External ear normal.     Mouth/Throat:     Pharynx: No oropharyngeal exudate or posterior oropharyngeal erythema.  Eyes:     General: No scleral icterus.        Right eye: No discharge.        Left eye: No discharge.     Conjunctiva/sclera: Conjunctivae normal.  Neck:     Musculoskeletal: Neck supple.     Trachea: No tracheal deviation.  Cardiovascular:     Rate and Rhythm: Normal rate and regular rhythm.  Pulmonary:     Effort: Pulmonary effort is normal. No respiratory distress.     Breath sounds: Normal breath sounds. No stridor. No wheezing or rales.     Comments: Patient is only able to speak in a few words at a time Abdominal:     General: Bowel sounds are normal. There is no distension.     Palpations: Abdomen is soft.     Tenderness: There is no abdominal tenderness. There is no guarding or rebound.  Musculoskeletal:        General: No tenderness.  Skin:    General: Skin is warm and dry.     Findings: No rash.  Neurological:     Mental Status: He is alert.     Cranial Nerves: No cranial nerve deficit (no facial droop, extraocular movements intact, no slurred speech).     Sensory: No sensory deficit.     Motor: No abnormal muscle tone or seizure activity.     Coordination: Coordination normal.     Comments: Patient is able to move all his extremities he can grip with both hands, he can lift both legs slightly off the bed      ED Treatments / Results  Labs (all labs ordered are listed, but only abnormal results are displayed) Labs Reviewed  LACTIC ACID, PLASMA - Abnormal; Notable for the following components:      Result Value   Lactic Acid, Venous 2.9 (*)    All other components within normal limits  COMPREHENSIVE METABOLIC PANEL - Abnormal; Notable for the following components:   Chloride 95 (*)    Glucose, Bld 335 (*)    Creatinine, Ser 1.42 (*)    Calcium 8.3 (*)    Albumin 3.3 (*)    GFR calc non Af Amer 51 (*)    GFR calc Af Amer 60 (*)    Anion gap 19 (*)    All other components within normal limits  CULTURE, BLOOD (ROUTINE X 2)  CULTURE, BLOOD (ROUTINE X 2)  CBC WITH DIFFERENTIAL/PLATELET  CK  LACTIC ACID,  PLASMA  URINALYSIS, ROUTINE W REFLEX MICROSCOPIC    EKG EKG Interpretation  Date/Time:  Tuesday March 11 2019 12:24:23 EDT Ventricular Rate:  106 PR Interval:    QRS Duration: 83 QT  Interval:  351 QTC Calculation: 467 R Axis:   -44 Text Interpretation:  Sinus tachycardia Probable left atrial enlargement Left anterior fascicular block Abnormal R-wave progression, early transition Left ventricular hypertrophy Since last tracing rate faster Confirmed by Dorie Rank [id number]) on 03/11/2019 12:46:36 PM   Radiology Dg Chest Port 1 View  Result Date: 03/11/2019 CLINICAL DATA:  Cough EXAM: PORTABLE CHEST 1 VIEW COMPARISON:  01/19/2018 FINDINGS: Examination is degraded due to portable technique. Grossly unchanged cardiac silhouette and mediastinal contours given AP projection. Potential developing slightly nodular airspace opacities within the bilateral upper lungs. No pleural effusion or pneumothorax no evidence of edema. No acute osseous abnormalities. IMPRESSION: Potential developing nodular airspace opacities within the bilateral upper lungs, potentially artifactual due to technique though developing infection could have a similar appearance. Clinical correlation is advised. Further evaluation with a PA and lateral chest radiograph may be obtained as clinically indicated. Electronically Signed   By: Sandi Mariscal M.D.   On: 03/11/2019 13:02    Procedures Procedures (including critical care time)  Medications Ordered in ED Medications  cefTRIAXone (ROCEPHIN) 2 g in sodium chloride 0.9 % 100 mL IVPB (0 g Intravenous Stopped 03/11/19 1327)  azithromycin (ZITHROMAX) 500 mg in sodium chloride 0.9 % 250 mL IVPB (0 mg Intravenous Stopped 03/11/19 1339)  sodium chloride 0.9 % bolus 500 mL (0 mLs Intravenous Stopped 03/11/19 1352)    Followed by  0.9 %  sodium chloride infusion (1,000 mLs Intravenous New Bag/Given 03/11/19 1232)  insulin aspart (novoLOG) injection 4 Units (has no administration in time  range)  acetaminophen (TYLENOL) tablet 650 mg (650 mg Oral Given 03/11/19 1351)     Initial Impression / Assessment and Plan / ED Course  I have reviewed the triage vital signs and the nursing notes.  Pertinent labs & imaging results that were available during my care of the patient were reviewed by me and considered in my medical decision making (see chart for details).   Clinical Course as of Mar 10 1428  Tue Mar 11, 2019  1209 High suspicion for covid illness.  Will hold off on aggressive fluid intervention, sepsis bundle right now considering risk for ARDS.   [IP]  1898 Laboratory tests reviewed.  CBC is normal.  Electrolyte panel notable for hyperglycemia and acute kidney injury.  Chest x-ray suggestive of possible pneumonia.   [JK]    Clinical Course User Index [JK] Dorie Rank, MD     Patient has improved with treatment in the emergency room.  He is able to speak clearly now.  He is more awake and alert.  Patient remains tachypneic but he is oxygenating normally on nasal cannula supplement.  Patient was started on antibiotics to cover for possible bacterial pneumonia.  I have a high suspicion of COVID-19 respiratory illness.  With his tachypnea and profound weakness he will need to be admitted to hospital for further treatment and monitoring.  Benjamin Cohen was evaluated in Emergency Department on 03/11/2019 for the symptoms described in the history of present illness. He was evaluated in the context of the global COVID-19 pandemic, which necessitated consideration that the patient might be at risk for infection with the SARS-CoV-2 virus that causes COVID-19. Institutional protocols and algorithms that pertain to the evaluation of patients at risk for COVID-19 are in a state of rapid change based on information released by regulatory bodies including the CDC and federal and state organizations. These policies and algorithms were followed during the patient's care in the ED.  Final  Clinical Impressions(s) / ED Diagnoses   Final diagnoses:  Acute Respiratory Disease due to Covid-19 Virus      Dorie Rank, MD 03/11/19 1430

## 2019-03-11 NOTE — ED Notes (Signed)
Bed: UV25 Expected date:  Expected time:  Means of arrival:  Comments: EMS-altered/UTI/fever

## 2019-03-11 NOTE — ED Notes (Signed)
ED Provider at bedside. 

## 2019-03-11 NOTE — ED Notes (Signed)
Encouraged patient to urinate for sample. Condom cath remains in place.

## 2019-03-11 NOTE — ED Notes (Signed)
Patient made aware of need for urine sample. Patient unable to provide at this time.  

## 2019-03-11 NOTE — ED Triage Notes (Signed)
Patient arrived by EMS from home. Pt has been sitting on the floor for 3 days. Pt had been sitting in urine and urinating in cups at home. EMS reported dark, cloudy urine. Pt doesn't c/o of any respiratory distress. Pt has dyspnea on exertion which is not patient baseline.   Pt states he was exposed to an individual that had corona virus March 15th at a wedding in [place].   T 99.5 F, BP 160/100, HR 120, RR 24, CBG 333.

## 2019-03-11 NOTE — H&P (Signed)
History and Physical    Benjamin Cohen PQA:[id number] DOB: 1956/08/05 DOA: 03/11/2019  PCP: Patient, No Pcp Per Consultants:  Erlinda Hong - neurology; Tammi Klippel - rad onc   Chief Complaint:  Respiratory illness  HPI: Benjamin Cohen is a 63 y.o. male with medical history significant of CVA (2014, 5/19) and HTN presenting with AMS.  Patient with fever, cough, SOB following attendance at a wedding with multiple family members currently diagnosed with COVID.  He is mildly confused an unable to provide a good history.  He was located in his floor after having been apparently lying there several days, filling up various containers with urine since he wasn't able to get himself out of the floor.    ED Course:  Very high suspicion for COVID - wedding earlier this month with known infected patients who were there.  Symptoms a few days ago.  Unable to stand, weak.  Urinating in jugs.  Fever to 102, cough, sore throat.  Hydrated and a bit better.  Sats ok currently but RR 30s.  CXR with possible PNA.  Review of Systems: As per HPI; otherwise review of systems reviewed and negative.   Ambulatory Status:  Ambulates without assistance  Past Medical History:  Diagnosis Date   Hypertension    Stroke [facility])     Past Surgical History:  Procedure Laterality Date   NO PAST SURGERIES      Social History   Socioeconomic History   Marital status: Single    Spouse name: Not on file   Number of children: 3   Years of education: 12th   Highest education level: Not on file  Occupational History   Occupation: N/A    Fish farm manager: [employer] [place] resource strain: Not on file   Food insecurity:    Worry: Not on file    Inability: Not on file   Transportation needs:    Medical: Not on file    Non-medical: Not on file  Tobacco Use   Smoking status: Never Smoker   Smokeless tobacco: Never Used  Substance and Sexual Activity   Alcohol use: No    Alcohol/week: 0.0 standard  drinks   Drug use: No   Sexual activity: Yes  Lifestyle   Physical activity:    Days per week: Not on file    Minutes per session: Not on file   Stress: Not on file  Relationships   Social connections:    Talks on phone: Not on file    Gets together: Not on file    Attends religious service: Not on file    Active member of club or organization: Not on file    Attends meetings of clubs or organizations: Not on file    Relationship status: Not on file   Intimate partner violence:    Fear of current or ex partner: Not on file    Emotionally abused: Not on file    Physically abused: Not on file    Forced sexual activity: Not on file  Other Topics Concern   Not on file  Social History Narrative   Patient is single with 3 children.   Patient is right handed.   Patient has hs education.    Patient drinks 2 cups weekly.    No Known Allergies  Family History  Problem Relation Age of Onset   Cancer Mother    Hypertension Brother     Prior to Admission medications   Medication Sig Start  Date End Date Taking? Authorizing Provider  ibuprofen (ADVIL,MOTRIN) 200 MG tablet Take 200 mg by mouth every 6 (six) hours as needed for headache or moderate pain.   Yes [provider]  atorvastatin (LIPITOR) 40 MG tablet Take 1 tablet (40 mg total) by mouth daily at 6 PM. Patient not taking: Reported on 03/11/2019 05/02/18   Kayleen Memos, DO  blood glucose meter kit and supplies Dispense based on patient and insurance preference. Use up to four times daily as directed. (FOR ICD-10 E10.9, E11.9). 05/02/18   Kayleen Memos, DO  clopidogrel (PLAVIX) 75 MG tablet Take 1 tablet (75 mg total) by mouth daily. Patient not taking: Reported on 03/11/2019 05/03/18   Kayleen Memos, DO  glimepiride (AMARYL) 1 MG tablet Take 1 tablet (1 mg total) by mouth every morning. Patient not taking: Reported on 03/11/2019 05/02/18 05/02/19  Kayleen Memos, DO  insulin glargine (LANTUS) 100 UNIT/ML  injection Inject 0.15 mLs (15 Units total) into the skin at bedtime. Patient not taking: Reported on 03/11/2019 05/02/18   Kayleen Memos, DO  metFORMIN (GLUCOPHAGE) 1000 MG tablet Take 1 tablet (1,000 mg total) by mouth 2 (two) times daily with a meal. Patient not taking: Reported on 03/11/2019 05/02/18 05/02/19  Kayleen Memos, DO    Physical Exam: Vitals:   03/11/19 1300 03/11/19 1328 03/11/19 1330 03/11/19 1430  BP: (!) 169/112  (!) 143/119 (!) 128/97  Pulse: (!) 104  (!) 102 (!) 106  Resp: (!) 33  (!) 37 (!) 32  Temp:  (!) 100.5 F (38.1 C)    TempSrc:  Oral    SpO2: 94%  95% 93%  Weight:      Height:          General:  Appears moderately ill and confused  Eyes:  PERRL, EOMI, normal lids, iris  ENT:  grossly normal hearing, lips & tongue, mmm; facemask in place  Neck:  no LAD, masses or thyromegaly  Cardiovascular:  RR with mild tachycardia, no m/r/g. No LE edema.   Respiratory:  Diffuse mild scattered rhonchi with mildly increased respiratory effort.  Abdomen:  soft, NT, ND, NABS  Skin:  no rash or induration seen on limited exam  Musculoskeletal:  grossly normal tone BUE/BLE, good ROM, no bony abnormality  Psychiatric: blunted mood and affect, speech fluent and appropriate, AOx2, mildly confused  Neurologic:  CN 2-12 grossly intact, moves all extremities in coordinated fashion, sensation intact    Radiological Exams on Admission: Dg Chest Port 1 View  Result Date: 03/11/2019 CLINICAL DATA:  Cough EXAM: PORTABLE CHEST 1 VIEW COMPARISON:  01/19/2018 FINDINGS: Examination is degraded due to portable technique. Grossly unchanged cardiac silhouette and mediastinal contours given AP projection. Potential developing slightly nodular airspace opacities within the bilateral upper lungs. No pleural effusion or pneumothorax no evidence of edema. No acute osseous abnormalities. IMPRESSION: Potential developing nodular airspace opacities within the bilateral upper lungs,  potentially artifactual due to technique though developing infection could have a similar appearance. Clinical correlation is advised. Further evaluation with a PA and lateral chest radiograph may be obtained as clinically indicated. Electronically Signed   By: Sandi Mariscal M.D.   On: 03/11/2019 13:02    EKG: Independently reviewed.  Sinus tachycardia with rate 106; nonspecific ST changes with no evidence of acute ischemia   Labs on Admission: I have personally reviewed the available labs and imaging studies at the time of the admission.  Pertinent labs:   Glucose 335 BUN  21/Creatinine 1.42/GFR 60 Anion gap 19 Lactate 2.9 Normal WBC Blood culture pending  Assessment/Plan Principal Problem:   Acute respiratory failure with hypoxia (HCC) Active Problems:   Diabetes (HCC)   HLD (hyperlipidemia)   CVA (cerebral vascular accident) ([place])   Acute respiratory failure with hypoxia -Patient with presenting with SOB and fever; also with cough and confusion -Positive COVID contacts, multiple -He does not have a usual home O2 requirement and is currently requiring 2L [place] O2 -Because there is a possible COVID contact in this patient with a new O2 requirement, will consider at high risk for COVID at this time -Pertinent labs concerning for COVID include normal WBC count;  Mildly increased BUN/Creatinine -CXR with multifocal opacities which may be c/w COVID vs. Multifocal PNA; CT chest was not performed -Patient was seen wearing full PPE including: gown, gloves, head cover, N95, and face shield; donning and doffing was observed to be in compliance with current standards. -Patient log was signed and witnessed -Will admit for ongoing supportive care -Monitor in SDU for now -Will encourage patient to go to prone position as much as possible to potentially facilitate air movement and decrease the pressure on the right ventricle -Pending COVID labs include: ferritin; D-dimer; procalcitonin; LDH; and  IL-6  DM -Will check A1c -He is not currently taking any medications for this issue at this time -Cover with moderate-scale SSI  HLD -He is also not taking any medications for this issue -Assuming he has good recovery from this acute illness, consider inpatient (since outpatient availability is more challenging in the current COVID crisis) lipid panel and possibly initiation of statin prior to d/c  H/o CVA -He does not appear to be taking ASA at this time -At a minimum, this should be considered prior to d/c    DVT prophylaxis:  Lovenox  Code Status:  Full - confirmed with patient Family Communication: None present Disposition Plan:  Home once clinically improved Consults called: None  Admission status: Admit - It is my clinical opinion that admission to INPATIENT is reasonable and necessary because of the expectation that this patient will require hospital care that crosses at least 2 midnights to treat this condition based on the medical complexity of the problems presented.  Given the aforementioned information, the predictability of an adverse outcome is felt to be significant.     Karmen Bongo MD [facility]   How to contact the [facility] Attending or Consulting provider [place] or covering provider during after hours [place], for this patient?  1. Check the care team in [facility] and look for a) attending/consulting TR[facility] provider listed and b) the [facility] team listed 2. Log into [email or web address] and use [place]'s universal password to access. If you do not have the password, please contact the hospital operator. 3. Locate the [facility] provider you are looking for under [facility] and page to a number that you can be directly reached. 4. If you still have difficulty reaching the provider, please page the [facility] (Director on Call) for the Hospitalists listed on amion for assistance.   03/11/2019, 5:26 PM

## 2019-03-11 NOTE — ED Notes (Signed)
Provided a patient a warm wash cloth to wipe his face.

## 2019-03-12 DIAGNOSIS — J9601 Acute respiratory failure with hypoxia: Secondary | ICD-10-CM

## 2019-03-12 DIAGNOSIS — R6889 Other general symptoms and signs: Secondary | ICD-10-CM

## 2019-03-12 DIAGNOSIS — E782 Mixed hyperlipidemia: Secondary | ICD-10-CM

## 2019-03-12 LAB — RESPIRATORY PANEL BY PCR

## 2019-03-12 LAB — COMPREHENSIVE METABOLIC PANEL
ALT: 23 U/L (ref 0–44)
AST: 47 U/L — ABNORMAL HIGH (ref 15–41)
Albumin: 2.9 g/dL — ABNORMAL LOW (ref 3.5–5.0)
Alkaline Phosphatase: 62 U/L (ref 38–126)
Anion gap: 12 (ref 5–15)
BUN: 15 mg/dL (ref 8–23)
CO2: 22 mmol/L (ref 22–32)
Calcium: 7.7 mg/dL — ABNORMAL LOW (ref 8.9–10.3)
Chloride: 105 mmol/L (ref 98–111)
Creatinine, Ser: 1.07 mg/dL (ref 0.61–1.24)
GFR calc Af Amer: >60 mL/min (ref >60)
GFR calc non Af Amer: >60 mL/min (ref >60)
GLUCOSE: 205 mg/dL — AB (ref 70–99)
Potassium: 4.3 mmol/L (ref 3.5–5.1)
Sodium: 139 mmol/L (ref 135–145)
Total Bilirubin: 1.4 mg/dL — ABNORMAL HIGH (ref 0.3–1.2)
Total Protein: 6.5 g/dL (ref 6.5–8.1)

## 2019-03-12 LAB — CBC WITH DIFFERENTIAL/PLATELET
ABS IMMATURE GRANULOCYTES: 0.04 10*3/uL (ref 0.00–0.07)
Basophils Absolute: 0 10*3/uL (ref 0.0–0.1)
Basophils Relative: 0 %
Eosinophils Absolute: 0 10*3/uL (ref 0.0–0.5)
Eosinophils Relative: 0 %
HCT: 50.3 % (ref 39.0–52.0)
Hemoglobin: 15.4 g/dL (ref 13.0–17.0)
Immature Granulocytes: 1 %
Lymphocytes Relative: 13 %
Lymphs Abs: 0.9 10*3/uL (ref 0.7–4.0)
MCH: 26.7 pg (ref 26.0–34.0)
MCHC: 30.6 g/dL (ref 30.0–36.0)
MCV: 87.2 fL (ref 80.0–100.0)
Monocytes Absolute: 0.1 10*3/uL (ref 0.1–1.0)
Monocytes Relative: 2 %
NEUTROS PCT: 84 %
Neutro Abs: 5.6 10*3/uL (ref 1.7–7.7)
Platelets: 202 10*3/uL (ref 150–400)
RBC: 5.77 MIL/uL (ref 4.22–5.81)
RDW: 12.7 % (ref 11.5–15.5)
WBC Morphology: INCREASED
WBC: 6.7 10*3/uL (ref 4.0–10.5)
nRBC: 0 % (ref 0.0–0.2)

## 2019-03-12 LAB — INFLUENZA PANEL BY PCR (TYPE A & B)
Influenza A By PCR: NEGATIVE
Influenza B By PCR: NEGATIVE

## 2019-03-12 LAB — GLUCOSE, CAPILLARY
Glucose-Capillary: 247 mg/dL — ABNORMAL HIGH (ref 70–99)
Glucose-Capillary: 233 mg/dL — ABNORMAL HIGH (ref 70–99)

## 2019-03-12 MED ORDER — INSULIN GLARGINE 100 UNIT/ML ~~LOC~~ SOLN
16.0000 [IU] | Freq: Every day | SUBCUTANEOUS | Status: DC
  Administered 2019-03-12: 22:00:00 16 [IU] via SUBCUTANEOUS
  Filled 2019-03-12 (×3): qty 0.16

## 2019-03-12 MED ORDER — SODIUM CHLORIDE 0.9 % IV SOLN
INTRAVENOUS | Status: DC
  Administered 2019-03-12 – 2019-03-14 (×5): via INTRAVENOUS

## 2019-03-12 MED ORDER — HYDROXYCHLOROQUINE SULFATE 200 MG PO TABS
200.0000 mg | ORAL_TABLET | Freq: Two times a day (BID) | ORAL | Status: DC
  Administered 2019-03-13: 200 mg via ORAL
  Filled 2019-03-12: qty 1

## 2019-03-12 MED ORDER — HYDRALAZINE HCL 20 MG/ML IJ SOLN
10.0000 mg | INTRAMUSCULAR | Status: DC | PRN
  Administered 2019-03-12 – 2019-03-23 (×11): 10 mg via INTRAVENOUS
  Filled 2019-03-12 (×11): qty 1

## 2019-03-12 MED ORDER — METOPROLOL TARTRATE 5 MG/5ML IV SOLN
5.0000 mg | Freq: Once | INTRAVENOUS | Status: AC
  Administered 2019-03-12: 06:00:00 5 mg via INTRAVENOUS
  Filled 2019-03-12: qty 5

## 2019-03-12 MED ORDER — CHLORHEXIDINE GLUCONATE CLOTH 2 % EX PADS
6.0000 | MEDICATED_PAD | Freq: Every day | CUTANEOUS | Status: DC
  Administered 2019-03-12 – 2019-03-26 (×10): 6 via TOPICAL

## 2019-03-12 MED ORDER — INSULIN ASPART 100 UNIT/ML ~~LOC~~ SOLN
0.0000 [IU] | SUBCUTANEOUS | Status: DC
  Administered 2019-03-12 – 2019-03-13 (×2): 5 [IU] via SUBCUTANEOUS
  Administered 2019-03-13: 04:00:00 3 [IU] via SUBCUTANEOUS

## 2019-03-12 MED ORDER — ACETAMINOPHEN 325 MG PO TABS
650.0000 mg | ORAL_TABLET | ORAL | Status: DC | PRN
  Administered 2019-03-12 – 2019-03-13 (×4): 650 mg via ORAL
  Filled 2019-03-12 (×4): qty 2

## 2019-03-12 MED ORDER — METOPROLOL TARTRATE 5 MG/5ML IV SOLN
5.0000 mg | Freq: Once | INTRAVENOUS | Status: AC
  Administered 2019-03-12: 5 mg via INTRAVENOUS
  Filled 2019-03-12: qty 5

## 2019-03-12 MED ORDER — HYDROXYCHLOROQUINE SULFATE 200 MG PO TABS
400.0000 mg | ORAL_TABLET | Freq: Once | ORAL | Status: AC
  Administered 2019-03-12: 400 mg via ORAL
  Filled 2019-03-12: qty 2

## 2019-03-12 MED ORDER — INSULIN GLARGINE 100 UNIT/ML ~~LOC~~ SOLN
8.0000 [IU] | Freq: Every day | SUBCUTANEOUS | Status: DC
  Filled 2019-03-12: qty 0.08

## 2019-03-12 MED ORDER — HYDROXYCHLOROQUINE SULFATE 200 MG PO TABS
400.0000 mg | ORAL_TABLET | Freq: Once | ORAL | Status: AC
  Administered 2019-03-13: 400 mg via ORAL
  Filled 2019-03-12: qty 2

## 2019-03-12 MED ORDER — ORAL CARE MOUTH RINSE
15.0000 mL | Freq: Two times a day (BID) | OROMUCOSAL | Status: DC
  Administered 2019-03-12 (×2): 15 mL via OROMUCOSAL

## 2019-03-12 NOTE — Consult Note (Signed)
NAME:  Benjamin Cohen, MRN:  [id number], DOB:  September 23, 1956, LOS: 1 ADMISSION DATE:  03/11/2019, CONSULTATION DATE:  03/12/19 REFERRING MD:  Dr. [place] Pink, CHIEF COMPLAINT:  SOB   Brief History   63 y/o M admitted 3/31 with reports of fever, cough, SOB and confusion.  He was recently at a wedding mid March and after multiple family members were diagnosed with COVID.  Developed worsening hypoxemia, respiratory distress 4/1 and PCCM consulted for evaluation.   History of present illness   63 y/o M admitted 3/31 with reports of fever, cough, SOB and confusion.  He was recently at a wedding mid March and after multiple family members were diagnosed with COVID.  The patient was apparently found on the floor and had been unable to get up for several days.  He laid and was urinating in different containers while on the floor.  He had fever to 102 and tachypnea on arrival.  CXR showed patchy bilateral infiltrates.  LDH on admit 360, PCT 0.10.  The patient was admitted per [facility], screened for COVID and treated empirically for CAP.  He initially required 2L O2.  Am of 4/1 he had increased fever, tachypnea and worsening O2 needs to 6L.    PCCM consulted for evaluation.    Past Medical History  HTN CVA - 2014, 04/2018 HTN  Significant Hospital Events   3/31 Admit, 2L, bilateral patchy infiltrates  4/01 PCCM consulted  Consults:  PCCM  Procedures:    Significant Diagnostic Tests:  LDH 3/31 360  Micro Data:  BCx2 3/31 >>   COVID 3/31 >>  RVP 4/1 >>  Antimicrobials:  Azithro 3/31 >>  Rocephin 3/31 >>    Interim history/subjective:  Tmax 102.6. RN reports pt on 6L.    Objective   Blood pressure 140/81, pulse (!) 112, temperature (!) 102.6 F (39.2 C), temperature source Oral, resp. rate (!) 37, height 5' 7" (1.702 m), weight 90.2 kg, SpO2 95 %.        Intake/Output Summary (Last 24 hours) at 03/12/2019 1415 Last data filed at 03/12/2019 0300 Gross per 24 hour  Intake 0 ml  Output 775 ml   Net -775 ml   Filed Weights   03/11/19 1207 03/11/19 1845  Weight: 93.9 kg 90.2 kg    Examination: General: ill appearing adult male lying in bed HEENT: MM pink/moist, no jvd, [place] O2  Neuro: disoriented, MAE, falling asleep during conversation  CV: s1s2 rrr, no m/r/g PULM: even/non-labored, lungs bilaterally diminished  WP:YKDX, non-tender, bsx4 active  Extremities: warm/dry, no edema  Skin: no rashes or lesions  Resolved Hospital Problem list     Assessment & Plan:   Acute Hypoxic Respiratory Failure with Bilateral Infiltrates  -r/o COVID (high suspicion given contact), CAP, influenza  P: Assess RVP  Await COVID  Follow intermittent CXR  Have reviewed with staff to call PCCM if O2 needs increased beyond 6L  High risk for intubation  Continue community acquired coverage  Trend PCT, LDH    DM II  P: Per [facility]  Hold home amaryl, metformin  Hx CVA, HLD P: Supportive care Per [facility]    Best practice:  Diet: NPO Pain/Anxiety/Delirium protocol (if indicated): n/a VAP protocol (if indicated): n/a DVT prophylaxis: lovenox  GI prophylaxis: n/a  Glucose control: SSI  Mobility: as tolerated  Code Status: Full Code  Family Communication: Attempted to call numbers listed in chart and the person who answered at [phone] states she does not know the patient or  the son that is listed Management consultant).  Patients cell phone called with no answer.  Disposition: SDU   Labs   CBC: Recent Labs  Lab 03/11/19 1210 03/12/19 0709  WBC 5.3 6.7  NEUTROABS 4.2 5.6  HGB 15.1 15.4  HCT 47.7 50.3  MCV 84.0 87.2  PLT 201 947    Basic Metabolic Panel: Recent Labs  Lab 03/11/19 1210 03/12/19 0709  NA 137 139  K 3.9 4.3  CL 95* 105  CO2 23 22  GLUCOSE 335* 205*  BUN 21 15  CREATININE 1.42* 1.07  CALCIUM 8.3* 7.7*   GFR: Estimated Creatinine Clearance: 73.7 mL/min (by C-G formula based on SCr of 1.07 mg/dL). Recent Labs  Lab 03/11/19 1210 03/11/19 1700 03/11/19  1850 03/12/19 0709  PROCALCITON  --   --  <0.10  --   WBC 5.3  --   --  6.7  LATICACIDVEN 2.9* 2.0*  --   --     Liver Function Tests: Recent Labs  Lab 03/11/19 1210 03/12/19 0709  AST 41 47*  ALT 25 23  ALKPHOS 74 62  BILITOT 0.8 1.4*  PROT 8.0 6.5  ALBUMIN 3.3* 2.9*   No results for input(s): LIPASE, AMYLASE in the last 168 hours. No results for input(s): AMMONIA in the last 168 hours.  ABG    Component Value Date/Time   TCO2 22 04/29/2018 1755     Coagulation Profile: No results for input(s): INR, PROTIME in the last 168 hours.  Cardiac Enzymes: Recent Labs  Lab 03/11/19 1210  CKTOTAL 322    HbA1C: Hgb A1c MFr Bld  Date/Time Value Ref Range Status  03/11/2019 03:38 PM 12.4 (H) 4.8 - 5.6 % Final    Comment:    (NOTE) Pre diabetes:          5.7%-6.4% Diabetes:              >6.4% Glycemic control for   <7.0% adults with diabetes   04/30/2018 01:28 AM 11.5 (H) 4.8 - 5.6 % Final    Comment:    (NOTE) Pre diabetes:          5.7%-6.4% Diabetes:              >6.4% Glycemic control for   <7.0% adults with diabetes     CBG: Recent Labs  Lab 03/11/19 1638 03/11/19 2150 03/12/19 1143  GLUCAP 208* 252* 247*    Review of Systems: Positive in bold  Gen: Denies fever, chills, weight change, fatigue, night sweats HEENT: Denies blurred vision, double vision, hearing loss, tinnitus, sinus congestion, rhinorrhea, sore throat, neck stiffness, dysphagia PULM: Denies shortness of breath, cough, sputum production, hemoptysis, wheezing CV: Denies chest pain, edema, orthopnea, paroxysmal nocturnal dyspnea, palpitations GI: Denies abdominal pain, nausea, vomiting, diarrhea, hematochezia, melena, constipation, change in bowel habits GU: Denies dysuria, hematuria, polyuria, oliguria, urethral discharge Endocrine: Denies hot or cold intolerance, polyuria, polyphagia or appetite change Derm: Denies rash, dry skin, scaling or peeling skin change Heme: Denies easy  bruising, bleeding, bleeding gums Neuro: Denies headache, numbness, weakness, slurred speech, loss of memory or consciousness   Past Medical History  He,  has a past medical history of Hypertension and Stroke ([place]).   Surgical History    Past Surgical History:  Procedure Laterality Date  . NO PAST SURGERIES       Social History   reports that he has never smoked. He has never used smokeless tobacco. He reports that he does not drink alcohol or  use drugs.   Family History   His family history includes Cancer in his mother; Hypertension in his brother.   Allergies No Known Allergies   Home Medications  Prior to Admission medications   Medication Sig Start Date End Date Taking? Authorizing Provider  atorvastatin (LIPITOR) 40 MG tablet Take 40 mg by mouth daily at 6 PM.   Yes [provider]  clopidogrel (PLAVIX) 75 MG tablet Take 75 mg by mouth daily.   Yes [provider]  glimepiride (AMARYL) 1 MG tablet Take 1 mg by mouth daily with breakfast.   Yes [provider]  ibuprofen (ADVIL,MOTRIN) 200 MG tablet Take 200 mg by mouth every 6 (six) hours as needed for headache or moderate pain.   Yes [provider]  metFORMIN (GLUCOPHAGE) 1000 MG tablet Take 1,000 mg by mouth 2 (two) times daily with a meal.   Yes [provider]  blood glucose meter kit and supplies Dispense based on patient and insurance preference. Use up to four times daily as directed. (FOR ICD-10 E10.9, E11.9). 05/02/18   Kayleen Memos, DO     Critical care time: n/a     Noe Gens, NP-C [facility] Pgr: [phone] or if no answer [phone] 03/12/2019, 2:15 PM

## 2019-03-12 NOTE — Progress Notes (Signed)
Inpatient Diabetes Program Recommendations  AACE/ADA: New Consensus Statement on Inpatient Glycemic Control (2015)  Target Ranges:  Prepandial:   less than 140 mg/dL      Peak postprandial:   less than 180 mg/dL (1-2 hours)      Critically ill patients:  140 - 180 mg/dL   Lab Results  Component Value Date   GLUCAP 252 (H) 03/11/2019   HGBA1C 12.4 (H) 03/11/2019    Review of Glycemic Control  Diabetes history: DM2 Outpatient Diabetes medications: Amaryl 1 mg QAM, metformin 1000 mg bid Current orders for Inpatient glycemic control: Novolog 0-15 units tidwc and hs  HgbA1C - 12.4% - uncontrolled. Has been on Lantus in the past. Needs good glycemic control.  Inpatient Diabetes Program Recommendations:     Add Lantus 10 units Q24H.  Will continue to follow closely.  Thank you. Ailene Ards, RD, LDN, CDE Inpatient Diabetes Coordinator [phone]

## 2019-03-12 NOTE — Progress Notes (Addendum)
Patient deteriorated during dayshift and PCCM consulted. Patient changed to NPO except sips w/meds and ice chips. Per PCCM notes need tight blood glucose control every 4 hours. Orders do not match. Paged. K. Schorr. CBGs & SSI are now every 4 hours while patient is NPO. Will continue to monitor.

## 2019-03-12 NOTE — Progress Notes (Signed)
[facility] PROGRESS NOTE  Benjamin Cohen YQM:[id number] DOB: 12-03-1956 DOA: 03/11/2019  PCP: Patient, No Pcp Per  Brief History/Interval Summary: 63 y.o. male with medical history significant of CVA (2014, 5/19) and HTN presenting with AMS.  Patient with fever, cough, SOB following attendance at a wedding with multiple family members currently diagnosed with COVID.  He was mildly confused and was unable to provide a good history. He was found on the floor after having been apparently lying there several days, filling up various containers with urine since he wasn't able to get himself out of the floor.  Patient with very high suspicion for COVID.   Reason for Visit: Fever and shortness of breath  Consultants: None  Procedures: None  Antibiotics: Ceftriaxone and azithromycin  Subjective/Interval History: Patient noted to be distracted at times.  According to nursing staff he at times has not been answering questions appropriately.  There is some history of noncompliance.  He does not remember the last time he took any of his diabetes medications.  ROS: Patient denies any nausea or vomiting  Objective:  Vital Signs  Vitals:   03/12/19 0600 03/12/19 0804 03/12/19 0900 03/12/19 1000  BP: (!) 195/112 (!) 175/95 (!) 162/98 (!) 155/101  Pulse: 86 99 98 (!) 101  Resp: (!) 31 (!) 26 (!) 33 (!) 24  Temp: 98.6 F (37 C) (!) 102.6 F (39.2 C)    TempSrc: Oral Oral    SpO2: 94% 95% 91% 95%  Weight:      Height:        Intake/Output Summary (Last 24 hours) at 03/12/2019 1055 Last data filed at 03/12/2019 0300 Gross per 24 hour  Intake 0 ml  Output 775 ml  Net -775 ml   Filed Weights   03/11/19 1207 03/11/19 1845  Weight: 93.9 kg 90.2 kg    General appearance: Awake alert.  In no distress.  Noted to be distracted. Resp: Noted to be tachypneic.  Coarse breath sounds bilaterally.  Few crackles at the bases. Cardio: S1-S2 is normal regular.  No S3-S4.  No rubs murmurs or  bruit GI: Abdomen is soft.  Nontender nondistended.  Bowel sounds are present normal.  No masses organomegaly Extremities: No edema.  Full range of motion of lower extremities. Neurologic: No focal neurological deficits.    Lab Results:  Data Reviewed: I have personally reviewed following labs and imaging studies  CBC: Recent Labs  Lab 03/11/19 1210 03/12/19 0709  WBC 5.3 6.7  NEUTROABS 4.2 5.6  HGB 15.1 15.4  HCT 47.7 50.3  MCV 84.0 87.2  PLT 201 202    Basic Metabolic Panel: Recent Labs  Lab 03/11/19 1210 03/12/19 0709  NA 137 139  K 3.9 4.3  CL 95* 105  CO2 23 22  GLUCOSE 335* 205*  BUN 21 15  CREATININE 1.42* 1.07  CALCIUM 8.3* 7.7*    GFR: Estimated Creatinine Clearance: 73.7 mL/min (by C-G formula based on SCr of 1.07 mg/dL).  Liver Function Tests: Recent Labs  Lab 03/11/19 1210 03/12/19 0709  AST 41 47*  ALT 25 23  ALKPHOS 74 62  BILITOT 0.8 1.4*  PROT 8.0 6.5  ALBUMIN 3.3* 2.9*    Cardiac Enzymes: Recent Labs  Lab 03/11/19 1210  CKTOTAL 322    HbA1C: Recent Labs    03/11/19 1538  HGBA1C 12.4*    CBG: Recent Labs  Lab 03/11/19 1638 03/11/19 2150  GLUCAP 208* 252*    Anemia Panel: Recent Labs  03/11/19 1850  FERRITIN 488*    Recent Results (from the past 240 hour(s))  Blood Culture (routine x 2)     Status: None (Preliminary result)   Collection Time: 03/11/19 12:15 PM  Result Value Ref Range Status   Specimen Description   Final    BLOOD RIGHT ANTECUBITAL Performed at [facility], [address], [place], [place] [address]    Special Requests   Final    BOTTLES DRAWN AEROBIC AND ANAEROBIC Blood Culture adequate volume Performed at [facility], [address] [place], [place] [address]    Culture   Final    NO GROWTH < 24 HOURS Performed at [facility], [address] [place], [place] [address]    Report Status PENDING  Incomplete  Blood Culture (routine x 2)     Status:  None (Preliminary result)   Collection Time: 03/11/19 12:22 PM  Result Value Ref Range Status   Specimen Description   Final    BLOOD LEFT ANTECUBITAL Performed at [facility], [address] [place], [place] [address]    Special Requests   Final    BOTTLES DRAWN AEROBIC AND ANAEROBIC Blood Culture adequate volume Performed at [facility], [address] [place], [place] [address]    Culture   Final    NO GROWTH < 24 HOURS Performed at [facility], [address] [place], [place] [address]    Report Status PENDING  Incomplete  MRSA PCR Screening     Status: None   Collection Time: 03/11/19  6:33 PM  Result Value Ref Range Status   MRSA by PCR NEGATIVE NEGATIVE Final    Comment:        The GeneXpert MRSA Assay (FDA approved for NASAL specimens only), is one component of a comprehensive MRSA colonization surveillance program. It is not intended to diagnose MRSA infection nor to guide or monitor treatment for MRSA infections. Performed at [facility], [address] [place], [place] [address]       Radiology Studies: Dg Chest Port 1 View  Result Date: 03/11/2019 CLINICAL DATA:  Cough EXAM: PORTABLE CHEST 1 VIEW COMPARISON:  01/19/2018 FINDINGS: Examination is degraded due to portable technique. Grossly unchanged cardiac silhouette and mediastinal contours given AP projection. Potential developing slightly nodular airspace opacities within the bilateral upper lungs. No pleural effusion or pneumothorax no evidence of edema. No acute osseous abnormalities. IMPRESSION: Potential developing nodular airspace opacities within the bilateral upper lungs, potentially artifactual due to technique though developing infection could have a similar appearance. Clinical correlation is advised. Further evaluation with a PA and lateral chest radiograph may be obtained as clinically indicated. Electronically Signed   By: Simonne Come M.D.   On:  03/11/2019 13:02     Medications:  Scheduled: . Chlorhexidine Gluconate Cloth  6 each Topical Daily  . docusate sodium  100 mg Oral BID  . enoxaparin (LOVENOX) injection  40 mg Subcutaneous Q24H  . insulin aspart  0-15 Units Subcutaneous TID WC  . insulin aspart  0-5 Units Subcutaneous QHS  . mouth rinse  15 mL Mouth Rinse BID  . sodium chloride flush  3 mL Intravenous Q12H   Continuous: . azithromycin Stopped (03/11/19 1339)  . cefTRIAXone (ROCEPHIN)  IV Stopped (03/11/19 1327)   LOV:FIEPPIRJJOACZ, bisacodyl, hydrALAZINE, ondansetron **OR** ondansetron (ZOFRAN) IV, oxyCODONE, polyethylene glycol    Assessment/Plan:  Patient Isolation: Airborne+Contact   HCP PPE: Wearing all  recommended PPE N95 mask + eye protection+ Gown + Gloves+ SurgicalCap  Patient PPE: None   Acute respiratory failure with hypoxia Patient with history of exposure to multiple individuals who were subsequently diagnosed with SARS-Cov2.  Patient considered high risk.  Test for normal coronavirus is pending.  Influenza PCR and respiratory viral panel also pending.  Chest x-ray showed multifocal opacities.  Continue ceftriaxone and azithromycin.  Lactic Acid level was noted to be elevated.  D-dimer elevated at 1.89.  LDH 360.  Ferritin 488.  Lactic acid level subsequently improved to 2.0.  Procalcitonin less than 0.1.  CRP 21.9.  Diabetes mellitus type 2, noncompliant, with hyperglycemia HbA1c greater than 12.  Patient apparently noncompliant with his medication regimen.  He does not even know what he supposed to be on for his diabetes.  Continue to monitor CBGs.  Continue SSI.  May need to consider long-acting insulin as well.  History of hyperlipidemia Apparently not taking any medications for same.  LDL was 137 in May.  History of stroke, other non-hemorrhagic Was hospitalized for the same in May 2019.  He was placed on dual antiplatelet treatment for 3 weeks and then was supposed to be on aspirin alone.   Apparently has not been taking his medications.   DVT Prophylaxis: Lovenox    Code Status: Full code Family Communication: No family at bedside Disposition Plan: Discussed with the patient.    LOS: 1 day   Cejay Cambre [employer] on [email or web address]  03/12/2019, 10:55 AM

## 2019-03-12 NOTE — Progress Notes (Addendum)
Patient asked me to please call his ex-wife, Lucrezia Europe and his son, Louanne Skye, at [phone] to give him an update on his condition and plan of care.

## 2019-03-13 ENCOUNTER — Inpatient Hospital Stay (HOSPITAL_COMMUNITY): Payer: 59

## 2019-03-13 ENCOUNTER — Inpatient Hospital Stay: Payer: Self-pay

## 2019-03-13 LAB — INTERLEUKIN-6, PLASMA: Interleukin-6, Plasma: 117.1 pg/mL — ABNORMAL HIGH (ref 0.0–12.2)

## 2019-03-13 LAB — CBC
HCT: 42.7 % (ref 39.0–52.0)
Hemoglobin: 13.6 g/dL (ref 13.0–17.0)
MCH: 26.8 pg (ref 26.0–34.0)
MCHC: 31.9 g/dL (ref 30.0–36.0)
MCV: 84.1 fL (ref 80.0–100.0)
Platelets: 234 10*3/uL (ref 150–400)
RBC: 5.08 MIL/uL (ref 4.22–5.81)
RDW: 12.9 % (ref 11.5–15.5)
WBC: 8.6 10*3/uL (ref 4.0–10.5)
nRBC: 0 % (ref 0.0–0.2)

## 2019-03-13 LAB — BLOOD CULTURE ID PANEL (REFLEXED)
Acinetobacter baumannii: NOT DETECTED
CANDIDA PARAPSILOSIS: NOT DETECTED
Candida albicans: NOT DETECTED
Candida glabrata: NOT DETECTED
Candida krusei: NOT DETECTED
Candida tropicalis: NOT DETECTED
ENTEROBACTER CLOACAE COMPLEX: NOT DETECTED
Enterobacteriaceae species: NOT DETECTED
Enterococcus species: NOT DETECTED
Escherichia coli: NOT DETECTED
Haemophilus influenzae: NOT DETECTED
Klebsiella oxytoca: NOT DETECTED
Klebsiella pneumoniae: NOT DETECTED
Listeria monocytogenes: NOT DETECTED
Neisseria meningitidis: NOT DETECTED
Proteus species: NOT DETECTED
Pseudomonas aeruginosa: NOT DETECTED
Serratia marcescens: NOT DETECTED
Staphylococcus aureus (BCID): NOT DETECTED
Staphylococcus species: NOT DETECTED
Streptococcus agalactiae: NOT DETECTED
Streptococcus pneumoniae: NOT DETECTED
Streptococcus pyogenes: NOT DETECTED
Streptococcus species: NOT DETECTED

## 2019-03-13 LAB — BLOOD GAS, ARTERIAL
Acid-base deficit: 2 mmol/L (ref 0.0–2.0)
Bicarbonate: 21.3 mmol/L (ref 20.0–28.0)
Drawn by: 441261
FIO2: 1
MECHVT: 400 mL
O2 Saturation: 99.4 %
PEEP: 12 cmH2O
Patient temperature: 38.6
RATE: 30 resp/min
pCO2 arterial: 36.1 mmHg (ref 32.0–48.0)
pH, Arterial: 7.397 (ref 7.350–7.450)
pO2, Arterial: 271 mmHg — ABNORMAL HIGH (ref 83.0–108.0)

## 2019-03-13 LAB — GLUCOSE, CAPILLARY
Glucose-Capillary: 137 mg/dL — ABNORMAL HIGH (ref 70–99)
Glucose-Capillary: 152 mg/dL — ABNORMAL HIGH (ref 70–99)
Glucose-Capillary: 158 mg/dL — ABNORMAL HIGH (ref 70–99)
Glucose-Capillary: 161 mg/dL — ABNORMAL HIGH (ref 70–99)
Glucose-Capillary: 172 mg/dL — ABNORMAL HIGH (ref 70–99)
Glucose-Capillary: 232 mg/dL — ABNORMAL HIGH (ref 70–99)

## 2019-03-13 LAB — BASIC METABOLIC PANEL
Anion gap: 11 (ref 5–15)
BUN: 12 mg/dL (ref 8–23)
CO2: 23 mmol/L (ref 22–32)
Calcium: 7.6 mg/dL — ABNORMAL LOW (ref 8.9–10.3)
Chloride: 106 mmol/L (ref 98–111)
Creatinine, Ser: 1.03 mg/dL (ref 0.61–1.24)
GFR calc Af Amer: >60 mL/min (ref >60)
GFR calc non Af Amer: >60 mL/min (ref >60)
Glucose, Bld: 135 mg/dL — ABNORMAL HIGH (ref 70–99)
Potassium: 2.9 mmol/L — ABNORMAL LOW (ref 3.5–5.1)
Sodium: 140 mmol/L (ref 135–145)

## 2019-03-13 LAB — TRIGLYCERIDES: Triglycerides: 184 mg/dL — ABNORMAL HIGH (ref <150)

## 2019-03-13 LAB — MAGNESIUM: Magnesium: 2.3 mg/dL (ref 1.7–2.4)

## 2019-03-13 MED ORDER — FENTANYL BOLUS VIA INFUSION
25.0000 ug | INTRAVENOUS | Status: DC | PRN
  Administered 2019-03-13: 25 ug via INTRAVENOUS
  Filled 2019-03-13: qty 25

## 2019-03-13 MED ORDER — FENTANYL CITRATE (PF) 100 MCG/2ML IJ SOLN
INTRAMUSCULAR | Status: AC
  Administered 2019-03-13: 10:00:00
  Filled 2019-03-13: qty 2

## 2019-03-13 MED ORDER — FENTANYL 2500MCG IN NS 250ML (10MCG/ML) PREMIX INFUSION
25.0000 ug/h | INTRAVENOUS | Status: DC
  Administered 2019-03-13: 18:00:00 300 ug/h via INTRAVENOUS
  Administered 2019-03-13: 150 ug/h via INTRAVENOUS
  Administered 2019-03-14 (×2): 325 ug/h via INTRAVENOUS
  Administered 2019-03-14: 01:00:00 350 ug/h via INTRAVENOUS
  Filled 2019-03-13 (×6): qty 250

## 2019-03-13 MED ORDER — ROCURONIUM BROMIDE 50 MG/5ML IV SOLN: 100.0000 mg | Freq: Once | INTRAVENOUS | Status: DC

## 2019-03-13 MED ORDER — CHLORHEXIDINE GLUCONATE CLOTH 2 % EX PADS: 6.0000 | MEDICATED_PAD | Freq: Every day | CUTANEOUS | Status: DC

## 2019-03-13 MED ORDER — SODIUM CHLORIDE 0.9% FLUSH
10.0000 mL | INTRAVENOUS | Status: DC | PRN
  Administered 2019-03-27: 10 mL
  Filled 2019-03-13: qty 40

## 2019-03-13 MED ORDER — FAMOTIDINE 40 MG/5ML PO SUSR: 20.0000 mg | Freq: Two times a day (BID) | ORAL | Status: DC

## 2019-03-13 MED ORDER — ORAL CARE MOUTH RINSE
15.0000 mL | OROMUCOSAL | Status: DC
  Administered 2019-03-13 – 2019-03-26 (×88): 15 mL via OROMUCOSAL

## 2019-03-13 MED ORDER — POTASSIUM CHLORIDE 10 MEQ/100ML IV SOLN
10.0000 meq | INTRAVENOUS | Status: AC
  Administered 2019-03-13 (×4): 10 meq via INTRAVENOUS
  Filled 2019-03-13 (×5): qty 100

## 2019-03-13 MED ORDER — ACETAMINOPHEN 160 MG/5ML PO SOLN
650.0000 mg | ORAL | Status: DC | PRN
  Administered 2019-03-13 – 2019-03-18 (×6): 650 mg
  Filled 2019-03-13 (×8): qty 20.3

## 2019-03-13 MED ORDER — MIDAZOLAM HCL 2 MG/2ML IJ SOLN
1.0000 mg | Freq: Once | INTRAMUSCULAR | Status: AC
  Administered 2019-03-13: 1 mg via INTRAVENOUS

## 2019-03-13 MED ORDER — PROPOFOL 1000 MG/100ML IV EMUL
INTRAVENOUS | Status: AC
  Filled 2019-03-13: qty 100

## 2019-03-13 MED ORDER — MIDAZOLAM HCL 2 MG/2ML IJ SOLN
INTRAMUSCULAR | Status: AC
  Administered 2019-03-13: 2 mg
  Filled 2019-03-13: qty 2

## 2019-03-13 MED ORDER — CHLORHEXIDINE GLUCONATE 0.12% ORAL RINSE (MEDLINE KIT)
15.0000 mL | Freq: Two times a day (BID) | OROMUCOSAL | Status: DC
  Administered 2019-03-13 – 2019-04-11 (×56): 15 mL via OROMUCOSAL

## 2019-03-13 MED ORDER — PHENYLEPHRINE HCL-NACL 10-0.9 MG/250ML-% IV SOLN
25.0000 ug/min | INTRAVENOUS | Status: DC
  Administered 2019-03-13: 19:00:00 40 ug/min via INTRAVENOUS
  Administered 2019-03-13: 25 ug/min via INTRAVENOUS
  Filled 2019-03-13 (×4): qty 250

## 2019-03-13 MED ORDER — PANTOPRAZOLE SODIUM 40 MG PO PACK
40.0000 mg | PACK | Freq: Every day | ORAL | Status: DC
  Administered 2019-03-13 – 2019-03-22 (×9): 40 mg
  Filled 2019-03-13 (×10): qty 20

## 2019-03-13 MED ORDER — SODIUM CHLORIDE 0.9% FLUSH
10.0000 mL | Freq: Two times a day (BID) | INTRAVENOUS | Status: DC
  Administered 2019-03-13 – 2019-03-20 (×14): 10 mL
  Administered 2019-03-20: 08:00:00 20 mL
  Administered 2019-03-21 – 2019-03-30 (×15): 10 mL
  Administered 2019-03-30: 20 mL
  Administered 2019-03-31 – 2019-04-01 (×3): 10 mL
  Administered 2019-04-02: 20 mL
  Administered 2019-04-02: 10 mL
  Administered 2019-04-03: 20 mL
  Administered 2019-04-03: 30 mL
  Administered 2019-04-04: 10 mL
  Administered 2019-04-05: 20 mL
  Administered 2019-04-06 – 2019-04-08 (×6): 10 mL
  Administered 2019-04-09: 22:00:00 20 mL
  Administered 2019-04-09 – 2019-04-10 (×2): 10 mL
  Administered 2019-04-10: 20:00:00 30 mL
  Administered 2019-04-11: 20 mL

## 2019-03-13 MED ORDER — ROCURONIUM BROMIDE 50 MG/5ML IV SOLN
100.0000 mg | Freq: Once | INTRAVENOUS | Status: AC
  Administered 2019-03-13: 12:00:00 100 mg via INTRAVENOUS

## 2019-03-13 MED ORDER — INSULIN ASPART 100 UNIT/ML ~~LOC~~ SOLN
0.0000 [IU] | SUBCUTANEOUS | Status: DC
  Administered 2019-03-13 (×2): 3 [IU] via SUBCUTANEOUS
  Administered 2019-03-13: 21:00:00 2 [IU] via SUBCUTANEOUS
  Administered 2019-03-13: 3 [IU] via SUBCUTANEOUS
  Administered 2019-03-14 – 2019-03-15 (×3): 2 [IU] via SUBCUTANEOUS
  Administered 2019-03-15: 01:00:00 3 [IU] via SUBCUTANEOUS
  Administered 2019-03-15 – 2019-03-16 (×3): 2 [IU] via SUBCUTANEOUS
  Administered 2019-03-16: 3 [IU] via SUBCUTANEOUS
  Administered 2019-03-16 – 2019-03-17 (×4): 2 [IU] via SUBCUTANEOUS
  Administered 2019-03-17 (×3): 3 [IU] via SUBCUTANEOUS
  Administered 2019-03-17 (×2): 2 [IU] via SUBCUTANEOUS
  Administered 2019-03-18 (×2): 5 [IU] via SUBCUTANEOUS
  Administered 2019-03-18 (×2): 3 [IU] via SUBCUTANEOUS
  Administered 2019-03-18: 5 [IU] via SUBCUTANEOUS
  Administered 2019-03-18: 11:00:00 3 [IU] via SUBCUTANEOUS
  Administered 2019-03-19: 05:00:00 5 [IU] via SUBCUTANEOUS
  Administered 2019-03-19: 21:00:00 11 [IU] via SUBCUTANEOUS
  Administered 2019-03-19 (×4): 5 [IU] via SUBCUTANEOUS
  Administered 2019-03-20: 8 [IU] via SUBCUTANEOUS
  Administered 2019-03-20 (×2): 11 [IU] via SUBCUTANEOUS
  Administered 2019-03-20: 5 [IU] via SUBCUTANEOUS
  Administered 2019-03-20: 11 [IU] via SUBCUTANEOUS
  Administered 2019-03-20: 8 [IU] via SUBCUTANEOUS
  Administered 2019-03-21: 15:00:00 11 [IU] via SUBCUTANEOUS
  Administered 2019-03-21 (×2): 8 [IU] via SUBCUTANEOUS
  Administered 2019-03-21: 11 [IU] via SUBCUTANEOUS
  Administered 2019-03-21: 8 [IU] via SUBCUTANEOUS
  Administered 2019-03-21: 11:00:00 15 [IU] via SUBCUTANEOUS
  Administered 2019-03-22 (×3): 8 [IU] via SUBCUTANEOUS
  Administered 2019-03-22 (×3): 11 [IU] via SUBCUTANEOUS
  Administered 2019-03-23 (×2): 8 [IU] via SUBCUTANEOUS
  Administered 2019-03-23: 16:00:00 3 [IU] via SUBCUTANEOUS
  Administered 2019-03-23: 09:00:00 8 [IU] via SUBCUTANEOUS
  Administered 2019-03-23 (×2): 11 [IU] via SUBCUTANEOUS
  Administered 2019-03-24: 5 [IU] via SUBCUTANEOUS
  Administered 2019-03-24 (×6): 3 [IU] via SUBCUTANEOUS
  Administered 2019-03-25 (×3): 5 [IU] via SUBCUTANEOUS
  Administered 2019-03-25 – 2019-03-26 (×3): 3 [IU] via SUBCUTANEOUS
  Administered 2019-03-26: 05:00:00 2 [IU] via SUBCUTANEOUS

## 2019-03-13 MED ORDER — ETOMIDATE 2 MG/ML IV SOLN
20.0000 mg | Freq: Once | INTRAVENOUS | Status: AC
  Administered 2019-03-13: 10:00:00 20 mg via INTRAVENOUS

## 2019-03-13 MED ORDER — SODIUM CHLORIDE 0.9 % IV SOLN
250.0000 mL | INTRAVENOUS | Status: DC
  Administered 2019-03-13: 16:00:00 250 mL via INTRAVENOUS

## 2019-03-13 MED ORDER — FENTANYL CITRATE (PF) 100 MCG/2ML IJ SOLN: 50.0000 ug | Freq: Once | INTRAMUSCULAR | Status: AC

## 2019-03-13 MED ORDER — MIDAZOLAM 50MG/50ML (1MG/ML) PREMIX INFUSION: 0.0000 mg/h | INTRAVENOUS | Status: DC

## 2019-03-13 MED ORDER — PROPOFOL 1000 MG/100ML IV EMUL
0.0000 ug/kg/min | INTRAVENOUS | Status: DC
  Administered 2019-03-13: 13:00:00 30 ug/kg/min via INTRAVENOUS
  Administered 2019-03-13: 16:00:00 50 ug/kg/min via INTRAVENOUS
  Administered 2019-03-13: 09:00:00 15 ug/kg/min via INTRAVENOUS
  Administered 2019-03-14: 03:00:00 50 ug/kg/min via INTRAVENOUS
  Administered 2019-03-14 (×3): 45 ug/kg/min via INTRAVENOUS
  Administered 2019-03-15 (×2): 25 ug/kg/min via INTRAVENOUS
  Administered 2019-03-15: 11:00:00 35 ug/kg/min via INTRAVENOUS
  Administered 2019-03-16: 06:00:00 25 ug/kg/min via INTRAVENOUS
  Filled 2019-03-13 (×14): qty 100

## 2019-03-13 MED ORDER — FENTANYL CITRATE (PF) 100 MCG/2ML IJ SOLN
100.0000 ug | Freq: Once | INTRAMUSCULAR | Status: AC
  Administered 2019-03-13: 100 ug via INTRAVENOUS

## 2019-03-13 MED ORDER — MIDAZOLAM BOLUS VIA INFUSION
1.0000 mg | INTRAVENOUS | Status: DC | PRN
  Filled 2019-03-13: qty 2

## 2019-03-13 MED ORDER — DOCUSATE SODIUM 50 MG/5ML PO LIQD: 100.0000 mg | Freq: Two times a day (BID) | ORAL | Status: DC | PRN

## 2019-03-13 NOTE — Progress Notes (Signed)
NAME:  Benjamin Cohen, MRN:  [id number], DOB:  07/09/1956, LOS: 2 ADMISSION DATE:  03/11/2019, CONSULTATION DATE:  03/12/19 REFERRING MD:  Dr. Rito Ehrlich, CHIEF COMPLAINT:  SOB   Brief History   63 y/o M admitted 3/31 with reports of fever, cough, SOB and confusion.  He was recently at a wedding mid March and after multiple family members were diagnosed with COVID.  Developed worsening hypoxemia, respiratory distress 4/1 and PCCM consulted for evaluation.   History of present illness   63 y/o M admitted 3/31 with reports of fever, cough, SOB and confusion.  He was recently at a wedding mid March and after multiple family members were diagnosed with COVID.  The patient was apparently found on the floor and had been unable to get up for several days.  He laid and was urinating in different containers while on the floor.  He had fever to 102 and tachypnea on arrival.  CXR showed patchy bilateral infiltrates.  LDH on admit 360, PCT 0.10.  The patient was admitted per [facility], screened for COVID and treated empirically for CAP.  He initially required 2L O2.  Am of 4/1 he had increased fever, tachypnea and worsening O2 needs to 6L.    PCCM consulted for evaluation.    Past Medical History  HTN CVA - 2014, 04/2018 HTN  Significant Hospital Events   3/31 Admit, 2L, bilateral patchy infiltrates  4/01 PCCM consulted  Consults:  PCCM  Procedures:  Intubation 4/2  Significant Diagnostic Tests:  LDH 3/31 360  Micro Data:  BCx2 3/31 >>   COVID 3/31 >>  RVP 4/1 >>negative Trach asp 4/2 >> COVID trach asp 4/2>>  Antimicrobials:  Azithro 3/31 >>  Rocephin 3/31 >>    Interim history/subjective:  Worsening oxygenation. On facemask  Objective   Blood pressure (!) 165/89, pulse (!) 126, temperature (!) 100.5 F (38.1 C), resp. rate (!) 30, height 5\' 7"  (1.702 m), weight 91.2 kg, SpO2 99 %.    Vent Mode: PRVC FiO2 (%):  [100 %] 100 % Set Rate:  [30 bmp] 30 bmp Vt Set:  [400 mL] 400 mL PEEP:   [12 cmH20] 12 cmH20   Intake/Output Summary (Last 24 hours) at 03/13/2019 0924 Last data filed at 03/13/2019 0534 Gross per 24 hour  Intake 624 ml  Output 900 ml  Net -276 ml   Filed Weights   03/11/19 1207 03/11/19 1845 03/13/19 0532  Weight: 93.9 kg 90.2 kg 91.2 kg    Physical Exam: General: Critically ill appearing, in respiratory distress HENT: [place], AT, ETT in place Eyes: EOMI, no scleral icterus Respiratory: Tachypneic, labored breathing Cardiovascular: Tachycardic, hypertensive Extremities:-Edema,-tenderness Neuro: Prior to intubation, awake and following commands. Now sedated Psych: Anxious Skin: no rashes or lesions  Resolved Hospital Problem list     Assessment & Plan:   Acute Hypoxic Respiratory Failure with Bilateral Infiltrates  -r/o COVID (high suspicion given contact), CAP, influenza  RVP negative P: Intubated Continue full vent support ABG CXR Await COVID  Continue community acquired coverage  Trend PCT, LDH    Hypertension P: PRN anti-hypertensive  Hypokalemia P: Replete  DM II  P: Per [facility]  Hold home amaryl, metformin  Hx CVA, HLD P: Supportive care Per [facility]    Best practice:  Diet: NPO Pain/Anxiety/Delirium protocol (if indicated): n/a VAP protocol (if indicated): n/a DVT prophylaxis: lovenox  GI prophylaxis: n/a  Glucose control: SSI  Mobility: as tolerated  Code Status: Full Code  Family Communication: Attempted to  call numbers listed in chart and the person who answered at [phone] states she does not know the patient or the son that is listed Geologist, engineering).  Patients cell phone called with no answer.  Disposition: SDU   Labs   CBC: Recent Labs  Lab 03/11/19 1210 03/12/19 0709 03/13/19 0522  WBC 5.3 6.7 8.6  NEUTROABS 4.2 5.6  --   HGB 15.1 15.4 13.6  HCT 47.7 50.3 42.7  MCV 84.0 87.2 84.1  PLT 201 202 234    Basic Metabolic Panel: Recent Labs  Lab 03/11/19 1210 03/12/19 0709 03/13/19 0522  NA 137 139  140  K 3.9 4.3 2.9*  CL 95* 105 106  CO2 23 22 23   GLUCOSE 335* 205* 135*  BUN 21 15 12   CREATININE 1.42* 1.07 1.03  CALCIUM 8.3* 7.7* 7.6*  MG  --   --  2.3   GFR: Estimated Creatinine Clearance: 77 mL/min (by C-G formula based on SCr of 1.03 mg/dL). Recent Labs  Lab 03/11/19 1210 03/11/19 1700 03/11/19 1850 03/12/19 0709 03/13/19 0522  PROCALCITON  --   --  <0.10  --   --   WBC 5.3  --   --  6.7 8.6  LATICACIDVEN 2.9* 2.0*  --   --   --     Liver Function Tests: Recent Labs  Lab 03/11/19 1210 03/12/19 0709  AST 41 47*  ALT 25 23  ALKPHOS 74 62  BILITOT 0.8 1.4*  PROT 8.0 6.5  ALBUMIN 3.3* 2.9*   No results for input(s): LIPASE, AMYLASE in the last 168 hours. No results for input(s): AMMONIA in the last 168 hours.  ABG    Component Value Date/Time   TCO2 22 04/29/2018 1755     Coagulation Profile: No results for input(s): INR, PROTIME in the last 168 hours.  Cardiac Enzymes: Recent Labs  Lab 03/11/19 1210  CKTOTAL 322    HbA1C: Hgb A1c MFr Bld  Date/Time Value Ref Range Status  03/11/2019 03:38 PM 12.4 (H) 4.8 - 5.6 % Final    Comment:    (NOTE) Pre diabetes:          5.7%-6.4% Diabetes:              >6.4% Glycemic control for   <7.0% adults with diabetes   04/30/2018 01:28 AM 11.5 (H) 4.8 - 5.6 % Final    Comment:    (NOTE) Pre diabetes:          5.7%-6.4% Diabetes:              >6.4% Glycemic control for   <7.0% adults with diabetes     CBG: Recent Labs  Lab 03/11/19 2150 03/12/19 1143 03/12/19 2139 03/13/19 0018 03/13/19 0343  GLUCAP 252* 247* 233* 232* 172*    Critical care time:70 min    The patient is critically ill with multiple organ systems failure and requires high complexity decision making for assessment and support, frequent evaluation and titration of therapies, application of advanced monitoring technologies and extensive interpretation of multiple databases.   Critical Care Time devoted to patient care  services described in this note is 70 Minutes. This time reflects time of care of this signee Dr. Mechele Collin. This critical care time does not reflect procedure time, or teaching time or supervisory time of PA/NP/Med student/Med Resident etc but could involve care discussion time.   Mechele Collin, M.D. [facility] Pulmonary/Critical Care Medicine Pager: [phone] After hours pager: [phone]

## 2019-03-13 NOTE — Progress Notes (Signed)
RN reporting hypotension with sedation.  Pt on 300 mcg's of fentanyl + propofol and still will wake / startle and become dyssynchronous with ventilation.    P: Neosynephrine for MAP > 65  Canary Brim, NP-C [place] [facility] Pgr: [phone] or if no answer [phone] 03/13/2019, 2:12 PM

## 2019-03-13 NOTE — Procedures (Signed)
Intubation Procedure Note Benjamin Cohen [id number] [birth year]  Procedure: Intubation Indications: Respiratory insufficiency  Procedure Details Consent: Risks of procedure as well as the alternatives and risks of each were explained to the (patient/caregiver).  Consent for procedure obtained. Time Out: Verified patient identification, verified procedure, site/side was marked, verified correct patient position, special equipment/implants available, medications/allergies/relevent history reviewed, required imaging and test results available.  Performed  Maximum sterile technique was used including antiseptics, cap, gloves, gown, hand hygiene, mask and sheet.  MAC and 4  Anterior airway. With repositioning, vocal cords visualized. ETT passed between vocal cords.  Evaluation Hemodynamic Status: BP stable throughout; O2 sats: severe hypoxemia Patient's Current Condition: stable Complications: Complications of hypoxemia intraprocedure Patient did tolerate procedure well. Chest X-ray ordered to verify placement.  CXR: pending.   Benjamin Cohen 03/13/2019

## 2019-03-13 NOTE — Progress Notes (Signed)
Secure chat to rounding provider to inquire about adding GI prophylaxis to this patient's profile.

## 2019-03-13 NOTE — Procedures (Signed)
Arterial Catheter Insertion Procedure Note Benjamin Cohen [id number] 09/16/1956  Procedure: Insertion of Arterial Catheter  Indications: Frequent blood sampling  Procedure Details Consent: Unable to obtain consent because of emergent medical necessity. Time Out: Verified patient identification, verified procedure, site/side was marked, verified correct patient position, special equipment/implants available, medications/allergies/relevent history reviewed, required imaging and test results available.  Performed  Maximum sterile technique was used including antiseptics, cap, gloves, gown, hand hygiene, mask and sheet. Skin prep: Chlorhexidine; local anesthetic administered Catheter was inserted into left radial artery using the Seldinger technique. ULTRASOUND GUIDANCE USED: YES Evaluation Blood flow good; BP tracing good. Complications: No apparent complications.   [name] 03/13/2019

## 2019-03-13 NOTE — Progress Notes (Signed)
PHARMACY - PHYSICIAN COMMUNICATION CRITICAL VALUE ALERT - BLOOD CULTURE IDENTIFICATION (BCID)  Benjamin Cohen is an 63 y.o. male who presented to [facility] on 03/11/2019 with a chief complaint of fever, cough, and SOB following attendance at a wedding with multiple other guests currently diagnosed with COVID-19. 1/4 admission BCx now growing GPC, no result on BCID.  Name of physician (or Provider) Contacted: Everardo All  Current antibiotics: Ceftriaxone and azithromycin  Changes to prescribed antibiotics recommended:  None - likely contaminant  Results for orders placed or performed during the hospital encounter of 03/11/19  Blood Culture ID Panel (Reflexed) (Collected: 03/11/2019 12:15 PM)  Result Value Ref Range   Enterococcus species NOT DETECTED NOT DETECTED   Listeria monocytogenes NOT DETECTED NOT DETECTED   Staphylococcus species NOT DETECTED NOT DETECTED   Staphylococcus aureus (BCID) NOT DETECTED NOT DETECTED   Streptococcus species NOT DETECTED NOT DETECTED   Streptococcus agalactiae NOT DETECTED NOT DETECTED   Streptococcus pneumoniae NOT DETECTED NOT DETECTED   Streptococcus pyogenes NOT DETECTED NOT DETECTED   Acinetobacter baumannii NOT DETECTED NOT DETECTED   Enterobacteriaceae species NOT DETECTED NOT DETECTED   Enterobacter cloacae complex NOT DETECTED NOT DETECTED   Escherichia coli NOT DETECTED NOT DETECTED   Klebsiella oxytoca NOT DETECTED NOT DETECTED   Klebsiella pneumoniae NOT DETECTED NOT DETECTED   Proteus species NOT DETECTED NOT DETECTED   Serratia marcescens NOT DETECTED NOT DETECTED   Haemophilus influenzae NOT DETECTED NOT DETECTED   Neisseria meningitidis NOT DETECTED NOT DETECTED   Pseudomonas aeruginosa NOT DETECTED NOT DETECTED   Candida albicans NOT DETECTED NOT DETECTED   Candida glabrata NOT DETECTED NOT DETECTED   Candida krusei NOT DETECTED NOT DETECTED   Candida parapsilosis NOT DETECTED NOT DETECTED   Candida tropicalis NOT DETECTED NOT  DETECTED    N. Zigmund Daniel, PharmD, BCPS PGY2 Infectious Diseases Pharmacy Resident Phone: [phone] 03/13/2019  1:20 PM

## 2019-03-13 NOTE — Progress Notes (Signed)
eLink Physician-Brief Progress Note Patient Name: Benjamin Cohen DOB: [birth year] MRN: [id number]   Date of Service  03/13/2019  HPI/Events of Note  Pt was intubated earlier and has been NPO.  Glucose 137 and was covered per insulin sliding scale.   eICU Interventions  Hold Lantus for now.  Continue checking glucose q4hrs and cover with insulin sliding scale.      Intervention Category Minor Interventions: Other:  Larinda Buttery 03/13/2019, 9:46 PM

## 2019-03-13 NOTE — Progress Notes (Signed)
Peripherally Inserted Central Catheter/Midline Placement  The IV Nurse has discussed with the patient and/or persons authorized to consent for the patient, the purpose of this procedure and the potential benefits and risks involved with this procedure.  The benefits include less needle sticks, lab draws from the catheter, and the patient may be discharged home with the catheter. Risks include, but not limited to, infection, bleeding, blood clot (thrombus formation), and puncture of an artery; nerve damage and irregular heartbeat and possibility to perform a PICC exchange if needed/ordered by physician.  Alternatives to this procedure were also discussed.  Bard Power PICC patient education guide, fact sheet on infection prevention and patient information card has been provided to patient /or left at bedside.    PICC/Midline Placement Documentation  PICC Triple Lumen 03/13/19 PICC Left Brachial 46 cm 0 cm (Active)  Indication for Insertion or Continuance of Line Prolonged intravenous therapies 03/13/2019  3:00 PM  Exposed Catheter (cm) 0 cm 03/13/2019  3:00 PM  Site Assessment Clean;Dry;Intact 03/13/2019  3:00 PM  Lumen #1 Status Flushed;Blood return noted 03/13/2019  3:00 PM  Lumen #2 Status Flushed;Blood return noted 03/13/2019  3:00 PM  Lumen #3 Status Flushed;Blood return noted 03/13/2019  3:00 PM  Dressing Type Transparent 03/13/2019  3:00 PM  Dressing Status Clean;Dry;Intact;Antimicrobial disc in place 03/13/2019  3:00 PM  Dressing Change Due 03/20/19 03/13/2019  3:00 PM       Stacie Glaze Horton 03/13/2019, 3:44 PM

## 2019-03-13 NOTE — Progress Notes (Signed)
Initial Nutrition Assessment  RD working remotely.   DOCUMENTATION CODES:   Obesity unspecified  INTERVENTION:  - If patient to remain intubated >/= 24 hours, recommend Vital High Protein @ 25 ml/hr with 30 ml prostat TID.  - this regimen + kcal from current propofol rate will provide 1417 kcal (111% estimated kcal need), 142 grams protein, and 502 ml free water.    NUTRITION DIAGNOSIS:   Inadequate oral intake related to inability to eat as evidenced by NPO status.  GOAL:   Provide needs based on ASPEN/SCCM guidelines  MONITOR:   Vent status, Weight trends, Labs  REASON FOR ASSESSMENT:   Ventilator  ASSESSMENT:   63 year-old male admitted 3/31 with reports of fever, cough, SOB and confusion.  He was recently at a wedding mid-March and after multiple family members were diagnosed with COVID-19. Patient had been found on the floor and unable to get for several days. He developed worsening hypoxemia, respiratory distress 4/1 and PCCM consulted for evaluation. CXR showed patchy bilateral infiltrates. He was screened for COVID in the ED on 3/31 and treated empirically for CAP.  Patient was intubated this AM and OGT now in place. Per chart review, current weight is 201 lb and weight on 05/29/18 was 210 lb; 9 lb weight loss in the past 10 months not significant for time frame.   Per Dr. George Hugh note this AM: acute hypoxic respiratory failure with bilateral infiltrates, r/o COVID, hypokalemia with repletion ordered. Her note indicates that phone numbers in the chart are inaccurate or no answer when called.     Patient is currently intubated on ventilator support MV: 11.6 L/min (as of 8:46 AM) Temp (24hrs), Avg:101.7 F (38.7 C), Min:100.5 F (38.1 C), Max:102.6 F (39.2 C) Propofol: 8.21 ml/hr (at 9 AM; 217 kcal).  Medications reviewed; sliding scale novolog, 16 units lantus/day, 10 mEq IV KCl x4 doses 4/2. Labs reviewed;  CBGs: 232 and 172 mg/dl today, K: 2.9 mmol/l, Ca:  7.6 mg/dl.  IVF; NS @ 75 ml/hr. Drips; fentanyl @ 150 mcg/hr, propofol @ 15 mcg/kg/min     NUTRITION - FOCUSED PHYSICAL EXAM:  Unable to complete at this time.   Diet Order:   Diet Order            Diet NPO time specified  Diet effective now              EDUCATION NEEDS:   Not appropriate for education at this time  Skin:  Skin Assessment: Reviewed RN Assessment  Last BM:  PTA/unknown  Height:   Ht Readings from Last 1 Encounters:  03/11/19 5\' 7"  (1.702 m)    Weight:   Wt Readings from Last 1 Encounters:  03/13/19 91.2 kg    Ideal Body Weight:  67.27 kg  BMI:  Body mass index is 31.49 kg/m.  Estimated Nutritional Needs:   Kcal:  9728-2060 kcal  Protein:  >/= 134 grams  Fluid:  >/= 1.5L/day     Trenton Gammon, MS, RD, LDN, CNSC Inpatient Clinical Dietitian Pager # [phone] After hours/weekend pager # [phone]

## 2019-03-14 DIAGNOSIS — R6521 Severe sepsis with septic shock: Secondary | ICD-10-CM

## 2019-03-14 DIAGNOSIS — J069 Acute upper respiratory infection, unspecified: Secondary | ICD-10-CM

## 2019-03-14 DIAGNOSIS — A419 Sepsis, unspecified organism: Secondary | ICD-10-CM

## 2019-03-14 DIAGNOSIS — E876 Hypokalemia: Secondary | ICD-10-CM

## 2019-03-14 LAB — BASIC METABOLIC PANEL
Anion gap: 8 (ref 5–15)
BUN: 17 mg/dL (ref 8–23)
CO2: 21 mmol/L — ABNORMAL LOW (ref 22–32)
Calcium: 7 mg/dL — ABNORMAL LOW (ref 8.9–10.3)
Chloride: 113 mmol/L — ABNORMAL HIGH (ref 98–111)
Creatinine, Ser: 1.09 mg/dL (ref 0.61–1.24)
GFR calc Af Amer: >60 mL/min (ref >60)
GFR calc non Af Amer: >60 mL/min (ref >60)
Glucose, Bld: 98 mg/dL (ref 70–99)
Potassium: 3.4 mmol/L — ABNORMAL LOW (ref 3.5–5.1)
Sodium: 142 mmol/L (ref 135–145)

## 2019-03-14 LAB — BLOOD GAS, ARTERIAL
Acid-base deficit: 2.7 mmol/L — ABNORMAL HIGH (ref 0.0–2.0)
Acid-base deficit: 1.7 mmol/L (ref 0.0–2.0)
Bicarbonate: 22.4 mmol/L (ref 20.0–28.0)
Bicarbonate: 20 mmol/L (ref 20.0–28.0)
Drawn by: 103701
FIO2: 100
FIO2: 100
MECHVT: 400 mL
MECHVT: 460 mL
O2 Saturation: 83.7 %
O2 Saturation: 99.4 %
PEEP: 14 cmH2O
PEEP: 16 cmH2O
Patient temperature: 37.7
Patient temperature: 101.5
RATE: 30 resp/min
RATE: 30 resp/min
pCO2 arterial: 43.9 mmHg (ref 32.0–48.0)
pCO2 arterial: 30.3 mmHg — ABNORMAL LOW (ref 32.0–48.0)
pH, Arterial: 7.333 — ABNORMAL LOW (ref 7.350–7.450)
pH, Arterial: 7.443 (ref 7.350–7.450)
pO2, Arterial: 56.6 mmHg — ABNORMAL LOW (ref 83.0–108.0)
pO2, Arterial: 292 mmHg — ABNORMAL HIGH (ref 83.0–108.0)

## 2019-03-14 LAB — GLUCOSE, CAPILLARY
Glucose-Capillary: 121 mg/dL — ABNORMAL HIGH (ref 70–99)
Glucose-Capillary: 87 mg/dL (ref 70–99)
Glucose-Capillary: 101 mg/dL — ABNORMAL HIGH (ref 70–99)
Glucose-Capillary: 89 mg/dL (ref 70–99)

## 2019-03-14 LAB — NOVEL CORONAVIRUS, NAA (HOSP ORDER, SEND-OUT TO REF LAB; TAT 18-24 HRS)

## 2019-03-14 LAB — NOVEL CORONAVIRUS, NAA (HOSPITAL ORDER, SEND-OUT TO REF LAB): SARS-COV-2, NAA: DETECTED — AB

## 2019-03-14 LAB — MAGNESIUM: Magnesium: 2.2 mg/dL (ref 1.7–2.4)

## 2019-03-14 MED ORDER — SODIUM CHLORIDE 0.9 % IV SOLN: INTRAVENOUS | Status: DC

## 2019-03-14 MED ORDER — FENTANYL 2500MCG IN NS 250ML (10MCG/ML) PREMIX INFUSION
100.0000 ug/h | INTRAVENOUS | Status: DC
  Administered 2019-03-14: 23:00:00 300 ug/h via INTRAVENOUS
  Administered 2019-03-15 – 2019-03-16 (×2): 150 ug/h via INTRAVENOUS
  Filled 2019-03-14 (×4): qty 250

## 2019-03-14 MED ORDER — MORPHINE SULFATE (PF) 2 MG/ML IV SOLN
2.0000 mg | INTRAVENOUS | Status: DC | PRN
  Administered 2019-03-14: 17:00:00 2 mg via INTRAVENOUS

## 2019-03-14 MED ORDER — MIDAZOLAM HCL 2 MG/2ML IJ SOLN: 2.0000 mg | Freq: Once | INTRAMUSCULAR | Status: DC | PRN

## 2019-03-14 MED ORDER — MIDAZOLAM BOLUS VIA INFUSION
2.0000 mg | INTRAVENOUS | Status: DC | PRN
  Administered 2019-03-16: 06:00:00 2 mg via INTRAVENOUS
  Filled 2019-03-14: qty 2

## 2019-03-14 MED ORDER — FENTANYL CITRATE (PF) 100 MCG/2ML IJ SOLN: 100.0000 ug | Freq: Once | INTRAMUSCULAR | Status: DC | PRN

## 2019-03-14 MED ORDER — MIDAZOLAM HCL 2 MG/2ML IJ SOLN
2.0000 mg | Freq: Once | INTRAMUSCULAR | Status: DC
  Filled 2019-03-14: qty 2

## 2019-03-14 MED ORDER — DEXTROSE-NACL 5-0.9 % IV SOLN
INTRAVENOUS | Status: DC
  Administered 2019-03-16: 01:00:00 via INTRAVENOUS

## 2019-03-14 MED ORDER — HYDROXYCHLOROQUINE SULFATE 200 MG PO TABS
200.0000 mg | ORAL_TABLET | Freq: Two times a day (BID) | ORAL | Status: AC
  Administered 2019-03-14 – 2019-03-17 (×7): 200 mg
  Filled 2019-03-14 (×7): qty 1

## 2019-03-14 MED ORDER — FENTANYL BOLUS VIA INFUSION
50.0000 ug | INTRAVENOUS | Status: DC | PRN
  Administered 2019-03-16: 50 ug via INTRAVENOUS
  Filled 2019-03-14: qty 50

## 2019-03-14 MED ORDER — PHENYLEPHRINE HCL-NACL 10-0.9 MG/250ML-% IV SOLN
0.0000 ug/min | INTRAVENOUS | Status: DC
  Administered 2019-03-14 (×2): 35 ug/min via INTRAVENOUS
  Filled 2019-03-14 (×3): qty 250

## 2019-03-14 MED ORDER — VECURONIUM BROMIDE 10 MG IV SOLR
0.8000 ug/kg/min | INTRAVENOUS | Status: DC
  Administered 2019-03-14 – 2019-03-15 (×2): 1 ug/kg/min via INTRAVENOUS
  Filled 2019-03-14: qty 100
  Filled 2019-03-14: qty 90
  Filled 2019-03-14: qty 100

## 2019-03-14 MED ORDER — FENTANYL CITRATE (PF) 100 MCG/2ML IJ SOLN: 100.0000 ug | Freq: Once | INTRAMUSCULAR | Status: DC

## 2019-03-14 MED ORDER — TOCILIZUMAB 400 MG/20ML IV SOLN
400.0000 mg | Freq: Once | INTRAVENOUS | Status: AC
  Administered 2019-03-14: 400 mg via INTRAVENOUS
  Filled 2019-03-14: qty 20

## 2019-03-14 MED ORDER — MIDAZOLAM 50MG/50ML (1MG/ML) PREMIX INFUSION
2.0000 mg/h | INTRAVENOUS | Status: DC
  Administered 2019-03-14: 18:00:00 6 mg/h via INTRAVENOUS
  Administered 2019-03-14: 18:00:00 2 mg/h via INTRAVENOUS
  Filled 2019-03-14 (×4): qty 50

## 2019-03-14 MED ORDER — ARTIFICIAL TEARS OPHTHALMIC OINT
1.0000 "application " | TOPICAL_OINTMENT | Freq: Three times a day (TID) | OPHTHALMIC | Status: DC
  Administered 2019-03-14 – 2019-03-17 (×9): 1 via OPHTHALMIC
  Filled 2019-03-14 (×2): qty 3.5

## 2019-03-14 MED ORDER — MORPHINE SULFATE (PF) 2 MG/ML IV SOLN
INTRAVENOUS | Status: AC
  Administered 2019-03-14: 17:00:00 2 mg via INTRAVENOUS
  Filled 2019-03-14: qty 1

## 2019-03-14 MED ORDER — ACETAMINOPHEN 650 MG RE SUPP
650.0000 mg | RECTAL | Status: DC | PRN
  Administered 2019-03-14: 19:00:00 650 mg via RECTAL
  Filled 2019-03-14: qty 1

## 2019-03-14 MED ORDER — VECURONIUM BOLUS VIA INFUSION
10.0000 mg | Freq: Once | INTRAVENOUS | Status: AC
  Administered 2019-03-14: 18:00:00 10 mg via INTRAVENOUS
  Filled 2019-03-14: qty 10

## 2019-03-14 NOTE — Progress Notes (Signed)
Pt started desatting on 50% FiO2 and 12 of PEEP into 80s and 70s and was starting to have tremors on the left size and upper body.  MD Everardo All ordered an ABG and morphine at the bedside. Pt now on 100% FiO and 16 PEEP. MD Mechele Collin placing further orders currently.

## 2019-03-14 NOTE — Progress Notes (Signed)
NAME:  Benjamin Cohen, MRN:  [id number], DOB:  1956-11-26, LOS: 3 ADMISSION DATE:  03/11/2019, CONSULTATION DATE:  03/12/19 REFERRING MD:  Dr. Rito Ehrlich, CHIEF COMPLAINT:  SOB   Brief History   63 y/o M admitted 3/31 with reports of fever, cough, SOB and confusion.  He was recently at a wedding mid March and after multiple family members were diagnosed with COVID.  Developed worsening hypoxemia, respiratory distress 4/1 and PCCM consulted for evaluation. Intubated 4/2 for hypoxemic resp failure.  COVID Positive.   Past Medical History  HTN CVA - 2014, 04/2018 HTN  Significant Hospital Events   3/31 Admit, 2L, bilateral patchy infiltrates  4/01 PCCM consulted 4/02 COVID positive results returned  Consults:  PCCM  Procedures:  Intubation 4/2 >> LUE PICC 4/2 >>   Significant Diagnostic Tests:  LDH 3/31 >> 360 Ferritin 3/31 >> 488  Micro Data:  BCx2 3/31 >> GPC 1/2 >> COVID 3/31 >> POSITIVE RVP 4/1 >> negative Trach asp 4/2 >> COVID trach asp 4/2 >> canceled  Antimicrobials:  Azithro 3/31 >>  Rocephin 3/31 >>    Interim history/subjective:  Remains intubated.  Tmax 99.3.  I/O - 1.1L UOP / 3.5L positive for last 24 hours.  60% fiO2 / PEEP 12  Objective   Blood pressure (!) 112/59, pulse 70, temperature 99.3 F (37.4 C), resp. rate (!) 30, height 5\' 7"  (1.702 m), weight 91.2 kg, SpO2 97 %.    Vent Mode: PRVC FiO2 (%):  [60 %-100 %] 60 % Set Rate:  [30 bmp] 30 bmp Vt Set:  [400 mL] 400 mL PEEP:  [12 cmH20] 12 cmH20 Plateau Pressure:  [25 cmH20-35 cmH20] 25 cmH20   Intake/Output Summary (Last 24 hours) at 03/14/2019 0956 Last data filed at 03/14/2019 0800 Gross per 24 hour  Intake 4566.7 ml  Output 1100 ml  Net 3466.7 ml   Filed Weights   03/11/19 1207 03/11/19 1845 03/13/19 0532  Weight: 93.9 kg 90.2 kg 91.2 kg    Physical Exam: General: critically ill appearing adult male lying in bed in NAD HEENT: MM pink/moist, ETT Neuro: sedate  CV: s1s2 rrr, no m/r/g PULM:  even/non-labored, vent assisted breaths  HQ:PRFF, non-tender, bsx4 active  Extremities: warm/dry, trace edema  Skin: no rashes or lesions  Resolved Hospital Problem list     Assessment & Plan:   Acute Hypoxic Respiratory Failure with Bilateral Infiltrates, COVID Positive -r/o COVID (high suspicion given contact), CAP, influenza  RVP negative P: PRVC, low Vt ventilation 6cc/kg ARDS protocol  Follow CXR / ABG intermittently  Stop date added to abx  Shock  -multifactorial, sepsis + sedation Hx Hypertension P: Continue neo for MAP > 65  NS as below   Acute Metabolic Encephalopathy  P: Continue PAD protocol   Hypokalemia P: Monitor, replace as indicated  Decrease IVF to 36ml/hr for 24 hours, then KVO   DM II  P: SSI Hold metformin, amaryl   Hx CVA, HLD P: Supportive care    Best practice:  Diet: NPO Pain/Anxiety/Delirium protocol (if indicated): n/a VAP protocol (if indicated): n/a DVT prophylaxis: lovenox  GI prophylaxis: n/a  Glucose control: SSI  Mobility: as tolerated  Code Status: Full Code  Family Communication: Son called 4/3 and updated on patient status Disposition: SDU   Labs   CBC: Recent Labs  Lab 03/11/19 1210 03/12/19 0709 03/13/19 0522  WBC 5.3 6.7 8.6  NEUTROABS 4.2 5.6  --   HGB 15.1 15.4 13.6  HCT 47.7 50.3 42.7  MCV 84.0 87.2 84.1  PLT 201 202 234    Basic Metabolic Panel: Recent Labs  Lab 03/11/19 1210 03/12/19 0709 03/13/19 0522  NA 137 139 140  K 3.9 4.3 2.9*  CL 95* 105 106  CO2 23 22 23   GLUCOSE 335* 205* 135*  BUN 21 15 12   CREATININE 1.42* 1.07 1.03  CALCIUM 8.3* 7.7* 7.6*  MG  --   --  2.3   GFR: Estimated Creatinine Clearance: 77 mL/min (by C-G formula based on SCr of 1.03 mg/dL). Recent Labs  Lab 03/11/19 1210 03/11/19 1700 03/11/19 1850 03/12/19 0709 03/13/19 0522  PROCALCITON  --   --  <0.10  --   --   WBC 5.3  --   --  6.7 8.6  LATICACIDVEN 2.9* 2.0*  --   --   --     Liver Function Tests:  Recent Labs  Lab 03/11/19 1210 03/12/19 0709  AST 41 47*  ALT 25 23  ALKPHOS 74 62  BILITOT 0.8 1.4*  PROT 8.0 6.5  ALBUMIN 3.3* 2.9*   No results for input(s): LIPASE, AMYLASE in the last 168 hours. No results for input(s): AMMONIA in the last 168 hours.  ABG    Component Value Date/Time   PHART 7.397 03/13/2019 1449   PCO2ART 36.1 03/13/2019 1449   PO2ART 271 (H) 03/13/2019 1449   HCO3 21.3 03/13/2019 1449   TCO2 22 04/29/2018 1755   ACIDBASEDEF 2.0 03/13/2019 1449   O2SAT 99.4 03/13/2019 1449     Coagulation Profile: No results for input(s): INR, PROTIME in the last 168 hours.  Cardiac Enzymes: Recent Labs  Lab 03/11/19 1210  CKTOTAL 322    HbA1C: Hgb A1c MFr Bld  Date/Time Value Ref Range Status  03/11/2019 03:38 PM 12.4 (H) 4.8 - 5.6 % Final    Comment:    (NOTE) Pre diabetes:          5.7%-6.4% Diabetes:              >6.4% Glycemic control for   <7.0% adults with diabetes   04/30/2018 01:28 AM 11.5 (H) 4.8 - 5.6 % Final    Comment:    (NOTE) Pre diabetes:          5.7%-6.4% Diabetes:              >6.4% Glycemic control for   <7.0% adults with diabetes     CBG: Recent Labs  Lab 03/13/19 1614 03/13/19 2031 03/13/19 2347 03/14/19 0342 03/14/19 0926  GLUCAP 158* 137* 161* 121* 87    Critical care time: 40 minutes    Canary Brim, NP-C [place] [facility] Pgr: [phone] or if no answer [phone] 03/14/2019, 9:56 AM

## 2019-03-15 ENCOUNTER — Inpatient Hospital Stay (HOSPITAL_COMMUNITY): Payer: 59

## 2019-03-15 DIAGNOSIS — J80 Acute respiratory distress syndrome: Secondary | ICD-10-CM

## 2019-03-15 LAB — MAGNESIUM: Magnesium: 2.1 mg/dL (ref 1.7–2.4)

## 2019-03-15 LAB — BLOOD GAS, ARTERIAL
Acid-base deficit: 0.8 mmol/L (ref 0.0–2.0)
Acid-base deficit: 3.8 mmol/L — ABNORMAL HIGH (ref 0.0–2.0)
Acid-base deficit: 2.2 mmol/L — ABNORMAL HIGH (ref 0.0–2.0)
Bicarbonate: 21.9 mmol/L (ref 20.0–28.0)
Bicarbonate: 18.5 mmol/L — ABNORMAL LOW (ref 20.0–28.0)
Bicarbonate: 19.6 mmol/L — ABNORMAL LOW (ref 20.0–28.0)
Drawn by: 257881
Drawn by: 270211
FIO2: 80
FIO2: 0.7
FIO2: 0.7
MECHVT: 460 mL
MECHVT: 460 mL
O2 Saturation: 99.1 %
O2 Saturation: 95.6 %
O2 Saturation: 96.6 %
PEEP: 16 cmH2O
PEEP: 10 cmH2O
PEEP: 12 cmH2O
Patient temperature: 98.6
Patient temperature: 98.6
Patient temperature: 98.6
RATE: 30 resp/min
RATE: 30 resp/min
RATE: 30 resp/min
pCO2 arterial: 32.4 mmHg (ref 32.0–48.0)
pCO2 arterial: 26.1 mmHg — ABNORMAL LOW (ref 32.0–48.0)
pCO2 arterial: 27.7 mmHg — ABNORMAL LOW (ref 32.0–48.0)
pH, Arterial: 7.445 (ref 7.350–7.450)
pH, Arterial: 7.466 — ABNORMAL HIGH (ref 7.350–7.450)
pH, Arterial: 7.463 — ABNORMAL HIGH (ref 7.350–7.450)
pO2, Arterial: 202 mmHg — ABNORMAL HIGH (ref 83.0–108.0)
pO2, Arterial: 77.9 mmHg — ABNORMAL LOW (ref 83.0–108.0)
pO2, Arterial: 90.6 mmHg (ref 83.0–108.0)

## 2019-03-15 LAB — GLUCOSE, CAPILLARY
Glucose-Capillary: 103 mg/dL — ABNORMAL HIGH (ref 70–99)
Glucose-Capillary: 109 mg/dL — ABNORMAL HIGH (ref 70–99)
Glucose-Capillary: 128 mg/dL — ABNORMAL HIGH (ref 70–99)
Glucose-Capillary: 131 mg/dL — ABNORMAL HIGH (ref 70–99)
Glucose-Capillary: 135 mg/dL — ABNORMAL HIGH (ref 70–99)
Glucose-Capillary: 151 mg/dL — ABNORMAL HIGH (ref 70–99)

## 2019-03-15 LAB — COMPREHENSIVE METABOLIC PANEL WITH GFR
ALT: 12 U/L (ref 0–44)
AST: 29 U/L (ref 15–41)
Albumin: 1.6 g/dL — ABNORMAL LOW (ref 3.5–5.0)
Alkaline Phosphatase: 49 U/L (ref 38–126)
Anion gap: 7 (ref 5–15)
BUN: 13 mg/dL (ref 8–23)
CO2: 20 mmol/L — ABNORMAL LOW (ref 22–32)
Calcium: 6.4 mg/dL — CL (ref 8.9–10.3)
Chloride: 114 mmol/L — ABNORMAL HIGH (ref 98–111)
Creatinine, Ser: 0.89 mg/dL (ref 0.61–1.24)
GFR calc Af Amer: >60 mL/min
GFR calc non Af Amer: >60 mL/min
Glucose, Bld: 296 mg/dL — ABNORMAL HIGH (ref 70–99)
Potassium: 2.8 mmol/L — ABNORMAL LOW (ref 3.5–5.1)
Sodium: 141 mmol/L (ref 135–145)
Total Bilirubin: 0.5 mg/dL (ref 0.3–1.2)
Total Protein: 4.7 g/dL — ABNORMAL LOW (ref 6.5–8.1)

## 2019-03-15 LAB — CBC
HCT: 30.5 % — ABNORMAL LOW (ref 39.0–52.0)
Hemoglobin: 9.6 g/dL — ABNORMAL LOW (ref 13.0–17.0)
MCH: 27.5 pg (ref 26.0–34.0)
MCHC: 31.5 g/dL (ref 30.0–36.0)
MCV: 87.4 fL (ref 80.0–100.0)
Platelets: 229 10*3/uL (ref 150–400)
RBC: 3.49 MIL/uL — ABNORMAL LOW (ref 4.22–5.81)
RDW: 13.7 % (ref 11.5–15.5)
WBC: 3.7 10*3/uL — ABNORMAL LOW (ref 4.0–10.5)
nRBC: 0 % (ref 0.0–0.2)

## 2019-03-15 LAB — NOVEL CORONAVIRUS, NAA (HOSP ORDER, SEND-OUT TO REF LAB; TAT 18-24 HRS): SARS-CoV-2, NAA: DETECTED — AB

## 2019-03-15 LAB — BASIC METABOLIC PANEL
Anion gap: 9 (ref 5–15)
BUN: 12 mg/dL (ref 8–23)
CO2: 18 mmol/L — ABNORMAL LOW (ref 22–32)
Calcium: 6.9 mg/dL — ABNORMAL LOW (ref 8.9–10.3)
Chloride: 114 mmol/L — ABNORMAL HIGH (ref 98–111)
Creatinine, Ser: 0.95 mg/dL (ref 0.61–1.24)
GFR calc Af Amer: >60 mL/min (ref >60)
GFR calc non Af Amer: >60 mL/min (ref >60)
Glucose, Bld: 115 mg/dL — ABNORMAL HIGH (ref 70–99)
Potassium: 3.7 mmol/L (ref 3.5–5.1)
Sodium: 141 mmol/L (ref 135–145)

## 2019-03-15 LAB — CULTURE, RESPIRATORY W GRAM STAIN: Culture: NORMAL

## 2019-03-15 LAB — CULTURE, RESPIRATORY

## 2019-03-15 MED ORDER — NOREPINEPHRINE 4 MG/250ML-% IV SOLN
0.0000 ug/min | INTRAVENOUS | Status: DC
  Administered 2019-03-15: 12:00:00 2 ug/min via INTRAVENOUS
  Filled 2019-03-15: qty 250

## 2019-03-15 MED ORDER — SODIUM CHLORIDE 0.9 % IV SOLN
2.0000 mg/h | INTRAVENOUS | Status: DC
  Administered 2019-03-15: 5 mg/h via INTRAVENOUS
  Administered 2019-03-15: 08:00:00 7 mg/h via INTRAVENOUS
  Administered 2019-03-16 – 2019-03-17 (×3): 5 mg/h via INTRAVENOUS
  Filled 2019-03-15 (×5): qty 10

## 2019-03-15 MED ORDER — POTASSIUM CHLORIDE 20 MEQ/15ML (10%) PO SOLN
20.0000 meq | Freq: Once | ORAL | Status: AC
  Administered 2019-03-15: 20 meq via ORAL
  Filled 2019-03-15: qty 15

## 2019-03-15 MED ORDER — POTASSIUM CHLORIDE 10 MEQ/100ML IV SOLN
10.0000 meq | INTRAVENOUS | Status: AC
  Administered 2019-03-15 (×4): 10 meq via INTRAVENOUS
  Filled 2019-03-15 (×4): qty 100

## 2019-03-15 NOTE — Progress Notes (Signed)
NAME:  Benjamin Cohen, MRN:  [id number], DOB:  1956/06/26, LOS: 4 ADMISSION DATE:  03/11/2019, CONSULTATION DATE:  03/12/19 REFERRING MD:  Dr. Rito Ehrlich, CHIEF COMPLAINT:  SOB   Brief History   63 y/o M admitted 3/31 with reports of fever, cough, SOB and confusion.  He was recently at a wedding mid March and after multiple family members were diagnosed with COVID including his wife.  Developed worsening hypoxemia, respiratory distress 4/1 and PCCM consulted for evaluation.   History of present illness   63 y/o M admitted 3/31 with reports of fever, cough, SOB and confusion.  He was recently at a wedding mid March and after multiple family members were diagnosed with COVID.  The patient was apparently found on the floor and had been unable to get up for several days.  He laid and was urinating in different containers while on the floor.  He had fever to 102 and tachypnea on arrival.  CXR showed patchy bilateral infiltrates.  LDH on admit 360, PCT 0.10.  The patient was admitted per TR[facility], screened for COVID and treated empirically for CAP.  He initially required 2L O2.  Am of 4/1 he had increased fever, tachypnea and worsening O2 needs to 6L.    PCCM consulted for evaluation.    Past Medical History  HTN CVA - 2014, 04/2018 HTN  Significant Hospital Events   3/31 Admit, 2L, bilateral patchy infiltrates  4/01 PCCM consulted  Consults:  PCCM  Procedures:  Intubation 4/2  Significant Diagnostic Tests:  LDH 3/31 360  Micro Data:  BCx2 3/31 >>   COVID 3/31 >>  RVP 4/1 >>negative Trach asp 4/2 >> COVID trach asp 4/2>>  Antimicrobials:  Azithro 3/31 >>  Rocephin 3/31 >>    Interim history/subjective:  Improved oxygenation after proning. RN reports thick, copious dark brown non-bloody secretions in ETT tube. Off neo  Objective   Blood pressure 125/60, pulse 69, temperature (!) 97.5 F (36.4 C), resp. rate (!) 0, height 5\' 7"  (1.702 m), weight 91.2 kg, SpO2 97 %.    Vent Mode: PRVC  FiO2 (%):  [50 %-100 %] 80 % Set Rate:  [30 bmp] 30 bmp Vt Set:  [400 mL-460 mL] 460 mL PEEP:  [12 cmH20-16 cmH20] 16 cmH20 Plateau Pressure:  [25 cmH20-32 cmH20] 32 cmH20   Intake/Output Summary (Last 24 hours) at 03/15/2019 0856 Last data filed at 03/15/2019 0852 Gross per 24 hour  Intake 3019.53 ml  Output 1135 ml  Net 1884.53 ml   Filed Weights   03/11/19 1207 03/11/19 1845 03/13/19 0532  Weight: 93.9 kg 90.2 kg 91.2 kg    Physical Exam: General: Well-appearing, no acute distress HENT: [place], AT, ETT in place Respiratory: Diminished breath sounds.  No crackles, wheezing or rales Cardiovascular: RRR, no murmurs, no JVD GI: BS+, soft, nontender Extremities:-Edema,-tenderness Neuro: Sedated Skin: Intact, no rashes or bruising GU: Foley in place  Resolved Hospital Problem list   Hypotension  Assessment & Plan:   ARDS secondary to COVID-19 S/p toclizamab x 1 on 4/3 P: ABG now Full vent support ARDSnet protocol Paralyzed and proned Continue CAP coverage. End date 4/5 F/u BCX  Hypertension P: PRN anti-hypertensive  Hypokalemia P: Replete  DM II  P: CBG q4 Hold home amaryl, metformin  Hx CVA, HLD P: Supportive care  Best practice:  Diet: NPO Pain/Anxiety/Delirium protocol (if indicated): n/a VAP protocol (if indicated): n/a DVT prophylaxis: lovenox  GI prophylaxis: n/a  Glucose control: SSI  Mobility: as tolerated  Code Status: Full Code  Family Communication: RN updated wife on 4/4. Disposition: SDU   Labs   CBC: Recent Labs  Lab 03/11/19 1210 03/12/19 0709 03/13/19 0522 03/15/19 0500  WBC 5.3 6.7 8.6 3.7*  NEUTROABS 4.2 5.6  --   --   HGB 15.1 15.4 13.6 9.6*  HCT 47.7 50.3 42.7 30.5*  MCV 84.0 87.2 84.1 87.4  PLT 201 202 234 229    Basic Metabolic Panel: Recent Labs  Lab 03/11/19 1210 03/12/19 0709 03/13/19 0522 03/14/19 0930 03/15/19 0500  NA 137 139 140 142 141  K 3.9 4.3 2.9* 3.4* 2.8*  CL 95* 105 106 113* 114*  CO2 23 22  23  21* 20*  GLUCOSE 335* 205* 135* 98 296*  BUN 21 15 12 17 13   CREATININE 1.42* 1.07 1.03 1.09 0.89  CALCIUM 8.3* 7.7* 7.6* 7.0* 6.4*  MG  --   --  2.3 2.2 2.1   GFR: Estimated Creatinine Clearance: 89.1 mL/min (by C-G formula based on SCr of 0.89 mg/dL). Recent Labs  Lab 03/11/19 1210 03/11/19 1700 03/11/19 1850 03/12/19 0709 03/13/19 0522 03/15/19 0500  PROCALCITON  --   --  <0.10  --   --   --   WBC 5.3  --   --  6.7 8.6 3.7*  LATICACIDVEN 2.9* 2.0*  --   --   --   --     Liver Function Tests: Recent Labs  Lab 03/11/19 1210 03/12/19 0709 03/15/19 0500  AST 41 47* 29  ALT 25 23 12   ALKPHOS 74 62 49  BILITOT 0.8 1.4* 0.5  PROT 8.0 6.5 4.7*  ALBUMIN 3.3* 2.9* 1.6*   No results for input(s): LIPASE, AMYLASE in the last 168 hours. No results for input(s): AMMONIA in the last 168 hours.  ABG    Component Value Date/Time   PHART 7.443 03/14/2019 1959   PCO2ART 30.3 (L) 03/14/2019 1959   PO2ART 292 (H) 03/14/2019 1959   HCO3 20.0 03/14/2019 1959   TCO2 22 04/29/2018 1755   ACIDBASEDEF 1.7 03/14/2019 1959   O2SAT 99.4 03/14/2019 1959     Coagulation Profile: No results for input(s): INR, PROTIME in the last 168 hours.  Cardiac Enzymes: Recent Labs  Lab 03/11/19 1210  CKTOTAL 322    HbA1C: Hgb A1c MFr Bld  Date/Time Value Ref Range Status  03/11/2019 03:38 PM 12.4 (H) 4.8 - 5.6 % Final    Comment:    (NOTE) Pre diabetes:          5.7%-6.4% Diabetes:              >6.4% Glycemic control for   <7.0% adults with diabetes   04/30/2018 01:28 AM 11.5 (H) 4.8 - 5.6 % Final    Comment:    (NOTE) Pre diabetes:          5.7%-6.4% Diabetes:              >6.4% Glycemic control for   <7.0% adults with diabetes     CBG: Recent Labs  Lab 03/14/19 1216 03/14/19 1608 03/14/19 2036 03/15/19 0033 03/15/19 0522  GLUCAP 101* 89 135* 151* 131*    Critical care time:70 min    The patient is critically ill with multiple organ systems failure and  requires high complexity decision making for assessment and support, frequent evaluation and titration of therapies, application of advanced monitoring technologies and extensive interpretation of multiple databases.   Critical Care Time devoted to patient care services described in  this note is 46 Minutes. This time reflects time of care of this signee Dr. Mechele Collin. This critical care time does not reflect procedure time, or teaching time or supervisory time of PA/NP/Med student/Med Resident etc but could involve care discussion time.  Mechele Collin, M.D. [facility] Pulmonary/Critical Care Medicine Pager: [phone] After hours pager: [phone]

## 2019-03-15 NOTE — Progress Notes (Signed)
CRITICAL VALUE ALERT  Critical Value:  Calcium 6.4  Date & Time Notied:  03/15/19 0650am  Provider Notified: Pola Corn paged  Orders Received/Actions taken: MD aware; RN will continue to monitor

## 2019-03-15 NOTE — Progress Notes (Signed)
Patient proned with no issues at 1400. Patient vitals stable, will continue to monitor. Per MD please flip patient back over at [facility] 03/16/19.

## 2019-03-16 DIAGNOSIS — E1159 Type 2 diabetes mellitus with other circulatory complications: Secondary | ICD-10-CM

## 2019-03-16 LAB — BLOOD GAS, ARTERIAL
Acid-base deficit: 2.3 mmol/L — ABNORMAL HIGH (ref 0.0–2.0)
Acid-base deficit: 2.7 mmol/L — ABNORMAL HIGH (ref 0.0–2.0)
Acid-base deficit: 0.2 mmol/L (ref 0.0–2.0)
Bicarbonate: 19.9 mmol/L — ABNORMAL LOW (ref 20.0–28.0)
Bicarbonate: 20.8 mmol/L (ref 20.0–28.0)
Bicarbonate: 23 mmol/L (ref 20.0–28.0)
Drawn by: 11249
Drawn by: 257881
Drawn by: 11249
FIO2: 70
FIO2: 70
FIO2: 60
MECHVT: 460 mL
MECHVT: 460 mL
MECHVT: 460 mL
O2 Saturation: 97 %
O2 Saturation: 98.1 %
O2 Saturation: 96.2 %
PEEP: 14 cmH2O
PEEP: 14 cmH2O
PEEP: 12 cmH2O
Patient temperature: 36.7
Patient temperature: 98.6
Patient temperature: 98.2
RATE: 30 resp/min
RATE: 30 resp/min
RATE: 30 resp/min
pCO2 arterial: 28.8 mmHg — ABNORMAL LOW (ref 32.0–48.0)
pCO2 arterial: 33.8 mmHg (ref 32.0–48.0)
pCO2 arterial: 34.7 mmHg (ref 32.0–48.0)
pH, Arterial: 7.454 — ABNORMAL HIGH (ref 7.350–7.450)
pH, Arterial: 7.406 (ref 7.350–7.450)
pH, Arterial: 7.436 (ref 7.350–7.450)
pO2, Arterial: 93 mmHg (ref 83.0–108.0)
pO2, Arterial: 126 mmHg — ABNORMAL HIGH (ref 83.0–108.0)
pO2, Arterial: 87.3 mmHg (ref 83.0–108.0)

## 2019-03-16 LAB — GLUCOSE, CAPILLARY
Glucose-Capillary: 147 mg/dL — ABNORMAL HIGH (ref 70–99)
Glucose-Capillary: 150 mg/dL — ABNORMAL HIGH (ref 70–99)
Glucose-Capillary: 173 mg/dL — ABNORMAL HIGH (ref 70–99)
Glucose-Capillary: 147 mg/dL — ABNORMAL HIGH (ref 70–99)
Glucose-Capillary: 134 mg/dL — ABNORMAL HIGH (ref 70–99)
Glucose-Capillary: 132 mg/dL — ABNORMAL HIGH (ref 70–99)

## 2019-03-16 LAB — CULTURE, BLOOD (ROUTINE X 2)
Culture: NO GROWTH
Special Requests: ADEQUATE
Special Requests: ADEQUATE

## 2019-03-16 LAB — BASIC METABOLIC PANEL
Anion gap: 9 (ref 5–15)
BUN: 10 mg/dL (ref 8–23)
CO2: 19 mmol/L — ABNORMAL LOW (ref 22–32)
Calcium: 6.9 mg/dL — ABNORMAL LOW (ref 8.9–10.3)
Chloride: 114 mmol/L — ABNORMAL HIGH (ref 98–111)
Creatinine, Ser: 0.84 mg/dL (ref 0.61–1.24)
GFR calc Af Amer: >60 mL/min (ref >60)
GFR calc non Af Amer: >60 mL/min (ref >60)
Glucose, Bld: 158 mg/dL — ABNORMAL HIGH (ref 70–99)
Potassium: 3.3 mmol/L — ABNORMAL LOW (ref 3.5–5.1)
Sodium: 142 mmol/L (ref 135–145)

## 2019-03-16 LAB — TRIGLYCERIDES: Triglycerides: 695 mg/dL — ABNORMAL HIGH (ref <150)

## 2019-03-16 MED ORDER — FUROSEMIDE 10 MG/ML IJ SOLN
INTRAMUSCULAR | Status: AC
  Administered 2019-03-16: 40 mg via INTRAVENOUS
  Filled 2019-03-16: qty 4

## 2019-03-16 MED ORDER — FUROSEMIDE 10 MG/ML IJ SOLN
40.0000 mg | Freq: Four times a day (QID) | INTRAMUSCULAR | Status: AC
  Administered 2019-03-16 (×2): 40 mg via INTRAVENOUS
  Filled 2019-03-16: qty 4

## 2019-03-16 MED ORDER — POTASSIUM CHLORIDE 20 MEQ/15ML (10%) PO SOLN
40.0000 meq | Freq: Two times a day (BID) | ORAL | Status: DC
  Administered 2019-03-16 – 2019-03-23 (×15): 40 meq
  Filled 2019-03-16 (×15): qty 30

## 2019-03-16 MED ORDER — SODIUM CHLORIDE 0.9 % IV SOLN
100.0000 ug/h | INTRAVENOUS | Status: DC
  Administered 2019-03-16: 150 ug/h via INTRAVENOUS
  Administered 2019-03-17: 02:00:00 200 ug/h via INTRAVENOUS
  Filled 2019-03-16 (×3): qty 50

## 2019-03-16 MED ORDER — POTASSIUM CHLORIDE 20 MEQ/15ML (10%) PO SOLN
ORAL | Status: AC
  Administered 2019-03-16: 40 meq
  Filled 2019-03-16: qty 30

## 2019-03-16 NOTE — Progress Notes (Signed)
RT and RN tried to move patient head from the left side to the right side. Once we tured the patient to the right we had issues with keeping his head to stay that way. RT could not pass the suction catheter. So RN and this RT turned patient back to the left side. RT will continue to monitor

## 2019-03-16 NOTE — Progress Notes (Addendum)
PT -3 RASS goal of -4. Grimace and movement of eyelids with manipulation such as oral care, repositioning, foley care, etc. Fentanyl increased to ensure sedation and comfort. Foley patent, clean and dry. CHG wipes completed. Leads assessed and new placed. OG measures 53cm from lip and ETT 26cm from lip. Oral care, oral suction, and airway suctioning provided. Glucose 132, 2 units provided. PICC and lines assessed. IV access flushed and patent and saline locked. Tylenol provided via OG for increasing temp. Vent settings at 60%, 30 RR, Tidal Volume 460, and PEEP of 12.

## 2019-03-16 NOTE — Progress Notes (Signed)
NAME:  Benjamin Cohen, MRN:  [id number], DOB:  [birth year], LOS: 5 ADMISSION DATE:  03/11/2019, CONSULTATION DATE:  03/12/19 REFERRING MD:  Dr. Rito Ehrlich, CHIEF COMPLAINT:  SOB   Brief History   63 y/o M admitted 3/31 with reports of fever, cough, SOB and confusion.  He was recently at a wedding mid March and after multiple family members were diagnosed with COVID including his wife.  Developed worsening hypoxemia, respiratory distress 4/1 and PCCM consulted for evaluation.   History of present illness   63 y/o M admitted 3/31 with reports of fever, cough, SOB and confusion.  He was recently at a wedding mid March and after multiple family members were diagnosed with COVID.  The patient was apparently found on the floor and had been unable to get up for several days.  He laid and was urinating in different containers while on the floor.  He had fever to 102 and tachypnea on arrival.  CXR showed patchy bilateral infiltrates.  LDH on admit 360, PCT 0.10.  The patient was admitted per [facility], screened for COVID and treated empirically for CAP.  He initially required 2L O2.  Am of 4/1 he had increased fever, tachypnea and worsening O2 needs to 6L.    PCCM consulted for evaluation.    Past Medical History  HTN CVA - 2014, 04/2018 HTN  Significant Hospital Events   3/31 Admit, 2L, bilateral patchy infiltrates  4/01 PCCM consulted  Consults:  PCCM  Procedures:  Intubation 4/2  Significant Diagnostic Tests:  LDH 3/31 360  Micro Data:  BCx2 3/31 >>   COVID 3/31 >>  RVP 4/1 >>negative Trach asp 4/2 >> COVID trach asp 4/2>>  Antimicrobials:  Azithro 3/31 >>  Rocephin 3/31 >>    Interim history/subjective:  net positive, remains on paralytic, still prone   Objective   Blood pressure 106/61, pulse (!) 103, temperature (!) 97.5 F (36.4 C), resp. rate (!) 30, height 5\' 7"  (1.702 m), weight 91.2 kg, SpO2 100 %.    Vent Mode: PRVC FiO2 (%):  [60 %-75 %] 70 % Set Rate:  [30 bmp] 30 bmp Vt  Set:  [460 mL] 460 mL PEEP:  [10 cmH20-14 cmH20] 14 cmH20 Plateau Pressure:  [29 cmH20-33 cmH20] 29 cmH20   Intake/Output Summary (Last 24 hours) at 03/16/2019 0916 Last data filed at 03/16/2019 0900 Gross per 24 hour  Intake 3395.91 ml  Output 2050 ml  Net 1345.91 ml   Filed Weights   03/11/19 1207 03/11/19 1845 03/13/19 0532  Weight: 93.9 kg 90.2 kg 91.2 kg    Physical Exam:  General:  In bed on vent HENT: NCAT ETT in place PULM: Crackles bilaterally B, vent supported breathing CV: RRR, no mgr GI: BS+, soft, nontender MSK: normal bulk and tone Derm: pressure points (knees, heels, ankles)  Neuro: sedated on vent    Resolved Hospital Problem list   Hypotension  Assessment & Plan:   ARDS secondary to COVID-19 S/p toclizamab x 1 on 4/3 Resp alkalosis P: Decrease RR Wean PEEP/FiO2 per ARDS protocol Hold vecuronium today> instructed RN to keep him off Stop antibiotics today Repeat ABG after changing back to supine positioning, if P/F still less than 150 prone again Furosemide 40mg  x2 doses  Hypertension P: PRN hydralazine  Hypokalemia P: Replete today  DM II  P: SSI Hold home amaryl, metformin  Hx CVA, HLD P: Supportive care   Best practice:  Diet: NPO Pain/Anxiety/Delirium protocol (if indicated): n/a VAP protocol (if indicated):  n/a DVT prophylaxis: lovenox  GI prophylaxis: n/a  Glucose control: SSI  Mobility: as tolerated  Code Status: Full Code  Family Communication: RN updated wife on 4/4. Disposition: SDU   Labs   CBC: Recent Labs  Lab 03/11/19 1210 03/12/19 0709 03/13/19 0522 03/15/19 0500  WBC 5.3 6.7 8.6 3.7*  NEUTROABS 4.2 5.6  --   --   HGB 15.1 15.4 13.6 9.6*  HCT 47.7 50.3 42.7 30.5*  MCV 84.0 87.2 84.1 87.4  PLT 201 202 234 229    Basic Metabolic Panel: Recent Labs  Lab 03/13/19 0522 03/14/19 0930 03/15/19 0500 03/15/19 1400 03/16/19 0500  NA 140 142 141 141 142  K 2.9* 3.4* 2.8* 3.7 3.3*  CL 106 113* 114* 114*  114*  CO2 23 21* 20* 18* 19*  GLUCOSE 135* 98 296* 115* 158*  BUN 12 17 13 12 10   CREATININE 1.03 1.09 0.89 0.95 0.84  CALCIUM 7.6* 7.0* 6.4* 6.9* 6.9*  MG 2.3 2.2 2.1  --   --    GFR: Estimated Creatinine Clearance: 94.4 mL/min (by C-G formula based on SCr of 0.84 mg/dL). Recent Labs  Lab 03/11/19 1210 03/11/19 1700 03/11/19 1850 03/12/19 0709 03/13/19 0522 03/15/19 0500  PROCALCITON  --   --  <0.10  --   --   --   WBC 5.3  --   --  6.7 8.6 3.7*  LATICACIDVEN 2.9* 2.0*  --   --   --   --     Liver Function Tests: Recent Labs  Lab 03/11/19 1210 03/12/19 0709 03/15/19 0500  AST 41 47* 29  ALT 25 23 12   ALKPHOS 74 62 49  BILITOT 0.8 1.4* 0.5  PROT 8.0 6.5 4.7*  ALBUMIN 3.3* 2.9* 1.6*   No results for input(s): LIPASE, AMYLASE in the last 168 hours. No results for input(s): AMMONIA in the last 168 hours.  ABG    Component Value Date/Time   PHART 7.454 (H) 03/16/2019 0507   PCO2ART 28.8 (L) 03/16/2019 0507   PO2ART 93.0 03/16/2019 0507   HCO3 19.9 (L) 03/16/2019 0507   TCO2 22 04/29/2018 1755   ACIDBASEDEF 2.3 (H) 03/16/2019 0507   O2SAT 97.0 03/16/2019 0507     Coagulation Profile: No results for input(s): INR, PROTIME in the last 168 hours.  Cardiac Enzymes: Recent Labs  Lab 03/11/19 1210  CKTOTAL 322    HbA1C: Hgb A1c MFr Bld  Date/Time Value Ref Range Status  03/11/2019 03:38 PM 12.4 (H) 4.8 - 5.6 % Final    Comment:    (NOTE) Pre diabetes:          5.7%-6.4% Diabetes:              >6.4% Glycemic control for   <7.0% adults with diabetes   04/30/2018 01:28 AM 11.5 (H) 4.8 - 5.6 % Final    Comment:    (NOTE) Pre diabetes:          5.7%-6.4% Diabetes:              >6.4% Glycemic control for   <7.0% adults with diabetes     CBG: Recent Labs  Lab 03/15/19 1206 03/15/19 1549 03/15/19 2157 03/16/19 0116 03/16/19 0532  GLUCAP 109* 103* 128* 147* 150*    Critical care time: 40 min    Heber Glendora, MD [facility] Pager:  [phone] Cell: [phone] If no response, call [phone]

## 2019-03-17 LAB — BLOOD GAS, ARTERIAL
Acid-Base Excess: 0.7 mmol/L (ref 0.0–2.0)
Bicarbonate: 24.6 mmol/L (ref 20.0–28.0)
Drawn by: 441261
FIO2: 50
MECHVT: 400 mL
O2 Saturation: 96.5 %
PEEP: 12 cmH2O
Patient temperature: 37
RATE: 30 resp/min
pCO2 arterial: 39.2 mmHg (ref 32.0–48.0)
pH, Arterial: 7.415 (ref 7.350–7.450)
pO2, Arterial: 96.5 mmHg (ref 83.0–108.0)

## 2019-03-17 LAB — GLUCOSE, CAPILLARY
Glucose-Capillary: 125 mg/dL — ABNORMAL HIGH (ref 70–99)
Glucose-Capillary: 149 mg/dL — ABNORMAL HIGH (ref 70–99)
Glucose-Capillary: 150 mg/dL — ABNORMAL HIGH (ref 70–99)
Glucose-Capillary: 152 mg/dL — ABNORMAL HIGH (ref 70–99)
Glucose-Capillary: 182 mg/dL — ABNORMAL HIGH (ref 70–99)
Glucose-Capillary: 190 mg/dL — ABNORMAL HIGH (ref 70–99)

## 2019-03-17 LAB — PHOSPHORUS: Phosphorus: 3.5 mg/dL (ref 2.5–4.6)

## 2019-03-17 LAB — MAGNESIUM: Magnesium: 2.1 mg/dL (ref 1.7–2.4)

## 2019-03-17 MED ORDER — FENTANYL 2500MCG IN NS 250ML (10MCG/ML) PREMIX INFUSION
100.0000 ug/h | INTRAVENOUS | Status: DC
  Administered 2019-03-18: 02:00:00 275 ug/h via INTRAVENOUS
  Filled 2019-03-17 (×3): qty 250

## 2019-03-17 MED ORDER — ADULT MULTIVITAMIN LIQUID CH
15.0000 mL | Freq: Every day | ORAL | Status: DC
  Administered 2019-03-18 – 2019-03-19 (×2): 15 mL
  Filled 2019-03-17 (×2): qty 15

## 2019-03-17 MED ORDER — MIDAZOLAM BOLUS VIA INFUSION
2.0000 mg | INTRAVENOUS | Status: DC | PRN
  Administered 2019-03-20: 06:00:00 2 mg via INTRAVENOUS
  Filled 2019-03-17: qty 2

## 2019-03-17 MED ORDER — NOREPINEPHRINE BITARTRATE 1 MG/ML IV SOLN
0.0000 ug/min | INTRAVENOUS | Status: DC
  Filled 2019-03-17: qty 4

## 2019-03-17 MED ORDER — MIDAZOLAM BOLUS VIA INFUSION
2.0000 mg | INTRAVENOUS | Status: DC | PRN
  Filled 2019-03-17: qty 2

## 2019-03-17 MED ORDER — VITAL HIGH PROTEIN PO LIQD
1000.0000 mL | ORAL | Status: DC
  Administered 2019-03-17 – 2019-03-18 (×2): 1000 mL

## 2019-03-17 MED ORDER — SODIUM CHLORIDE 0.9 % IV SOLN
2.0000 mg/h | INTRAVENOUS | Status: DC
  Administered 2019-03-17: 17:00:00 5 mg/h via INTRAVENOUS
  Filled 2019-03-17 (×3): qty 10

## 2019-03-17 MED ORDER — FUROSEMIDE 10 MG/ML IJ SOLN
40.0000 mg | Freq: Once | INTRAMUSCULAR | Status: AC
  Administered 2019-03-17: 10:00:00 40 mg via INTRAVENOUS
  Filled 2019-03-17: qty 4

## 2019-03-17 MED ORDER — PRO-STAT SUGAR FREE PO LIQD
60.0000 mL | Freq: Two times a day (BID) | ORAL | Status: DC
  Administered 2019-03-17 – 2019-03-19 (×5): 60 mL
  Filled 2019-03-17 (×4): qty 60

## 2019-03-17 MED ORDER — NOREPINEPHRINE 4 MG/250ML-% IV SOLN
0.0000 ug/min | INTRAVENOUS | Status: DC
  Filled 2019-03-17 (×2): qty 250

## 2019-03-17 NOTE — Progress Notes (Addendum)
NAME:  Benjamin Cohen, MRN:  [id number], DOB:  Sep 13, 1956, LOS: 6 ADMISSION DATE:  03/11/2019, CONSULTATION DATE:  03/12/19 REFERRING MD:  Dr. Rito EhrlichKrishnan, CHIEF COMPLAINT:  SOB   Brief History   63 y/o M admitted 3/31 with reports of fever, cough, SOB and confusion.  He was recently at a wedding mid March and after multiple family members were diagnosed with COVID including his wife.  Developed worsening hypoxemia, respiratory distress 4/1 and PCCM consulted for evaluation.  Intubated 4/2 with ARDS.   Past Medical History  HTN CVA - 2014, 04/2018 HTN  Significant Hospital Events   3/31 Admit, 2L, bilateral patchy infiltrates  4/01 PCCM consulted 4/02 Intubated 4/05 Net positive, remains on paralytic, still prone 4/06 Supine, plateau pressure 23, 50% / 12 PEEP  Consults:  PCCM  Procedures:  ETT 4/2 >>  Significant Diagnostic Tests:  LDH 3/31 360  Micro Data:  BCx2 3/31 >> granulicatella adiacens 1/2  COVID 3/31 >> positive  RVP 4/1 >>negative Trach asp 4/2 >> COVID trach asp 4/2 >> Positive  Quantiferon Gold 4/6 >>   Antimicrobials:  Azithro 3/31 >> 4/4 Rocephin 3/31 >> 4/4  Interim history/subjective:  I/O- 4.5L UOP / -3.8L in last 24 hours.  Afebrile. Supine, plateau pressure 23, 50% / 12 PEEP  Objective   Blood pressure 136/70, pulse 76, temperature (!) 97.3 F (36.3 C), resp. rate (!) 30, height 5\' 7"  (1.702 m), weight 87.7 kg, SpO2 98 %.    Vent Mode: PRVC FiO2 (%):  [60 %] 60 % Set Rate:  [30 bmp] 30 bmp Vt Set:  [400 mL-460 mL] 400 mL PEEP:  [12 cmH20] 12 cmH20 Plateau Pressure:  [19 cmH20-23 cmH20] 23 cmH20   Intake/Output Summary (Last 24 hours) at 03/17/2019 0934 Last data filed at 03/17/2019 0800 Gross per 24 hour  Intake 673.46 ml  Output 3925 ml  Net -3251.54 ml   Filed Weights   03/11/19 1845 03/13/19 0532 03/17/19 0400  Weight: 90.2 kg 91.2 kg 87.7 kg    Physical Exam: General: critically ill appearing male lying in bed in NAD HEENT: MM  pink/moist, ETT Neuro: sedate  CV: s1s2 rrr, no m/r/g PULM: even/non-labored on vent, vent assisted breaths  WU:JWJXGI:soft, non-tender, bsx4 active  Extremities: warm/dry, no edema  Skin: no rashes or lesions  Resolved Hospital Problem list   Hypotension  Assessment & Plan:   ARDS secondary to COVID-19 S/p toclizamab x 1 on 4/3 Respiratory alkalosis P: PRVC, low Vt ventilation 6cc/kg  Rate 22  Follow PaO2/FiO2 ratio, if <150, prone position  Repeat lasix with KCL  Follow CXR  Hypo / Hypertension P: Levophed for MAP > 65 (sedation related) PRN hydralazine  Hypokalemia P: Monitor, replace as indicated  DM II  P: SSI  Hold home agents > amaryl, metformin   Hx CVA, HLD P: Supportive care    Best practice:  Diet: NPO Pain/Anxiety/Delirium protocol (if indicated): n/a VAP protocol (if indicated): n/a DVT prophylaxis: lovenox  GI prophylaxis: n/a  Glucose control: SSI  Mobility: as tolerated  Code Status: Full Code > asked family to revisit concept of CPR in the event of arrest.  He is critically ill but stable at this point but reviewed in the event of arrest CPR would not likely change the outcome.  Son Engineer, drilling([place]) lives with him and has a cough > explained need for him to stay home for minimum of 2 weeks.  Family Communication: Ex-wife and children updated on phone.  Disposition: ICU  Labs   CBC: Recent Labs  Lab 03/11/19 1210 03/12/19 0709 03/13/19 0522 03/15/19 0500  WBC 5.3 6.7 8.6 3.7*  NEUTROABS 4.2 5.6  --   --   HGB 15.1 15.4 13.6 9.6*  HCT 47.7 50.3 42.7 30.5*  MCV 84.0 87.2 84.1 87.4  PLT 201 202 234 229    Basic Metabolic Panel: Recent Labs  Lab 03/13/19 0522 03/14/19 0930 03/15/19 0500 03/15/19 1400 03/16/19 0500  NA 140 142 141 141 142  K 2.9* 3.4* 2.8* 3.7 3.3*  CL 106 113* 114* 114* 114*  CO2 23 21* 20* 18* 19*  GLUCOSE 135* 98 296* 115* 158*  BUN 12 17 13 12 10   CREATININE 1.03 1.09 0.89 0.95 0.84  CALCIUM 7.6* 7.0* 6.4* 6.9*  6.9*  MG 2.3 2.2 2.1  --   --    GFR: Estimated Creatinine Clearance: 92.6 mL/min (by C-G formula based on SCr of 0.84 mg/dL). Recent Labs  Lab 03/11/19 1210 03/11/19 1700 03/11/19 1850 03/12/19 0709 03/13/19 0522 03/15/19 0500  PROCALCITON  --   --  <0.10  --   --   --   WBC 5.3  --   --  6.7 8.6 3.7*  LATICACIDVEN 2.9* 2.0*  --   --   --   --     Liver Function Tests: Recent Labs  Lab 03/11/19 1210 03/12/19 0709 03/15/19 0500  AST 41 47* 29  ALT 25 23 12   ALKPHOS 74 62 49  BILITOT 0.8 1.4* 0.5  PROT 8.0 6.5 4.7*  ALBUMIN 3.3* 2.9* 1.6*   No results for input(s): LIPASE, AMYLASE in the last 168 hours. No results for input(s): AMMONIA in the last 168 hours.  ABG    Component Value Date/Time   PHART 7.436 03/16/2019 2015   PCO2ART 34.7 03/16/2019 2015   PO2ART 87.3 03/16/2019 2015   HCO3 23.0 03/16/2019 2015   TCO2 22 04/29/2018 1755   ACIDBASEDEF 0.2 03/16/2019 2015   O2SAT 96.2 03/16/2019 2015     Coagulation Profile: No results for input(s): INR, PROTIME in the last 168 hours.  Cardiac Enzymes: Recent Labs  Lab 03/11/19 1210  CKTOTAL 322    HbA1C: Hgb A1c MFr Bld  Date/Time Value Ref Range Status  03/11/2019 03:38 PM 12.4 (H) 4.8 - 5.6 % Final    Comment:    (NOTE) Pre diabetes:          5.7%-6.4% Diabetes:              >6.4% Glycemic control for   <7.0% adults with diabetes   04/30/2018 01:28 AM 11.5 (H) 4.8 - 5.6 % Final    Comment:    (NOTE) Pre diabetes:          5.7%-6.4% Diabetes:              >6.4% Glycemic control for   <7.0% adults with diabetes     CBG: Recent Labs  Lab 03/16/19 1705 03/16/19 2049 03/17/19 0006 03/17/19 0431 03/17/19 0921  GLUCAP 134* 132* 190* 125* 152*    Critical care time: 40 min    Canary Brim, NP-C [facility] Pgr: [phone] or if no answer [phone] 03/17/2019, 9:34 AM

## 2019-03-17 NOTE — Progress Notes (Signed)
PT -3 RASS goal of -3. Slight opening of eyes with painful stimuli or manipulation such as oral care, repositioning, foley care, etc. Fentanyl infusing at 29mcg/hr to ensure sedation and comfort. Versed infusing at 5mg /hr to ensure sedation and comfort. No need to titrate medications during shift thus far. Pulse ox site changed from forehead to RHand for better O2 saturation reading. Foley patent, clean and dry. CHG bath completed. Leads assessed and new placed. OG measures 53cm from lip and ETT 26cm from lip. Oral care, oral suction, and airway suctioning provided. ART line assessed, repositioned, and zeroed. Glucose 182, 3 units provided. PICC and lines assessed. IV tubing changed on all medications. IV access flushed and patent and saline locked. Vent settings at 50%, 30 RR, Tidal Volume 400, and PEEP of 12. Vital HP infusing at 76mL/hr with 63mL flushes programmed as ordered.

## 2019-03-17 NOTE — Progress Notes (Signed)
PT family member Bronson Ing called [phone]. PT family member updated on PT's status and health at current time. PT family understands information shared and is hopeful. Yvetted call back at [phone] requesting to facetime PT via Elink. Elink contacted and Mark at [facility] stated time frames facetime is appropriate. Contacted [name] with Elink information. [name] to call for facetime tomorrow during the day once family agrees with a time.

## 2019-03-17 NOTE — Progress Notes (Addendum)
Nutrition Follow-up  RD working remotely.   DOCUMENTATION CODES:   Obesity unspecified  INTERVENTION:  - will order TF: Vital High Protein @ 40 ml/hr with 60 ml prostat BID. This regimen will provide 1360 kcal, 144 grams of protein, 22 grams of fat, and 802 ml free water.  - will order liquid multivitamin per OGT.given TF rate <45 ml/hr. - free water flush, if desired, to be per MD/NP.  Monitor magnesium, potassium, and phosphorus daily for at least 3 days, MD to replete as needed, as pt is at risk for refeeding syndrome given persistent hypokalemia this admission, no nutrition x5 days, unsure of PO intakes prior to admission on 3/31.    NUTRITION DIAGNOSIS:   Inadequate oral intake related to inability to eat as evidenced by NPO status. -ongoing  GOAL:   Provide needs based on ASPEN/SCCM guidelines -will be met with TF regimen  MONITOR:   Vent status, TF tolerance, Weight trends, Labs  REASON FOR ASSESSMENT:   Consult Enteral/tube feeding initiation and management  ASSESSMENT:   63 year-old male admitted 3/31 with reports of fever, cough, SOB and confusion.  He was recently at a wedding mid-March and after multiple family members were diagnosed with COVID-19. Patient had been found on the floor and unable to get for several days. He developed worsening hypoxemia, respiratory distress 4/1 and PCCM consulted for evaluation. CXR showed patchy bilateral infiltrates. He was screened for COVID in the ED on 3/31 and treated empirically for CAP.  Patient remains intubated with OGT in place. Weight -3.5 kg/8 lb from 4/2-4/6. Talked with Brandi via secure chat and patient to remain supine at this time, unless respiratory status worsens then plan will be to return to prone positioning; confirmed okay to start TF today. No nutrition x5 days as last intake was 25% of breakfast on 4/1.   Per Brandi's note today: patient was prone yesterday and flipped to supine today, COVID test sent 4/2  was positive, ARDS 2/2 COVID-19, hypokalemia with repletion ordered.   Patient is currently intubated on ventilator support MV: 11.1 L/min as of 11:15 AM Temp (24hrs), Avg:98.2 F (36.8 C), Min:97.2 F (36.2 C), Max:100.6 F (38.1 C) Propofol: none  Medications reviewed; 40 mg IV lasix x1 dose 4/6, sliding scale novolog, 40 mEq KCl per OGT BID.  Labs reviewed; CBGs: 190, 125, and 152 mg/dl today; 4/5: K: 3.3 mmol/l, Cl: 114 mmol/l, Ca: 6.9 mg/dl, triglycerides: 695 mg/dl. Drips; fentanyl @ 200 mcg/hr, versed @ 5 mg/hr.     Diet Order:   Diet Order            Diet NPO time specified  Diet effective now              EDUCATION NEEDS:   Not appropriate for education at this time  Skin:  Skin Assessment: Reviewed RN Assessment  Last BM:  PTA/unknown  Height:   Ht Readings from Last 1 Encounters:  03/11/19 5' 7"  (1.702 m)    Weight:   Wt Readings from Last 1 Encounters:  03/17/19 87.7 kg    Ideal Body Weight:  67.27 kg  BMI:  Body mass index is 30.28 kg/m.  Estimated Nutritional Needs:   Kcal:  1052-1315 kcal  Protein:  >/= 134 grams  Fluid:  >/= 1.5L/day     Jarome Matin, MS, RD, LDN, CNSC Inpatient Clinical Dietitian Pager # [phone] After hours/weekend pager # [phone]

## 2019-03-17 NOTE — Progress Notes (Signed)
PT OG placed on low intermittent suction of 80.

## 2019-03-17 NOTE — Plan of Care (Signed)
Pt started on tube feeds today through his OG tube.  Will continue to monitor.  Jaclyn Shaggy RN

## 2019-03-18 ENCOUNTER — Inpatient Hospital Stay (HOSPITAL_COMMUNITY): Payer: 59

## 2019-03-18 LAB — GLUCOSE, CAPILLARY
Glucose-Capillary: 116 mg/dL — ABNORMAL HIGH (ref 70–99)
Glucose-Capillary: 174 mg/dL — ABNORMAL HIGH (ref 70–99)
Glucose-Capillary: 174 mg/dL — ABNORMAL HIGH (ref 70–99)
Glucose-Capillary: 175 mg/dL — ABNORMAL HIGH (ref 70–99)
Glucose-Capillary: 193 mg/dL — ABNORMAL HIGH (ref 70–99)
Glucose-Capillary: 202 mg/dL — ABNORMAL HIGH (ref 70–99)
Glucose-Capillary: 206 mg/dL — ABNORMAL HIGH (ref 70–99)
Glucose-Capillary: 217 mg/dL — ABNORMAL HIGH (ref 70–99)

## 2019-03-18 LAB — CBC
HCT: 35.4 % — ABNORMAL LOW (ref 39.0–52.0)
Hemoglobin: 10.8 g/dL — ABNORMAL LOW (ref 13.0–17.0)
MCH: 27 pg (ref 26.0–34.0)
MCHC: 30.5 g/dL (ref 30.0–36.0)
MCV: 88.5 fL (ref 80.0–100.0)
Platelets: 299 10*3/uL (ref 150–400)
RBC: 4 MIL/uL — ABNORMAL LOW (ref 4.22–5.81)
RDW: 13.8 % (ref 11.5–15.5)
WBC: 3.9 10*3/uL — ABNORMAL LOW (ref 4.0–10.5)
nRBC: 0 % (ref 0.0–0.2)

## 2019-03-18 LAB — BASIC METABOLIC PANEL
Anion gap: 8 (ref 5–15)
BUN: 28 mg/dL — ABNORMAL HIGH (ref 8–23)
CO2: 24 mmol/L (ref 22–32)
Calcium: 7.9 mg/dL — ABNORMAL LOW (ref 8.9–10.3)
Chloride: 114 mmol/L — ABNORMAL HIGH (ref 98–111)
Creatinine, Ser: 1.05 mg/dL (ref 0.61–1.24)
GFR calc Af Amer: >60 mL/min (ref >60)
GFR calc non Af Amer: >60 mL/min (ref >60)
Glucose, Bld: 194 mg/dL — ABNORMAL HIGH (ref 70–99)
Potassium: 4 mmol/L (ref 3.5–5.1)
Sodium: 146 mmol/L — ABNORMAL HIGH (ref 135–145)

## 2019-03-18 LAB — PHOSPHORUS
Phosphorus: 3.2 mg/dL (ref 2.5–4.6)
Phosphorus: 3.6 mg/dL (ref 2.5–4.6)

## 2019-03-18 LAB — MAGNESIUM
Magnesium: 2.2 mg/dL (ref 1.7–2.4)
Magnesium: 2.1 mg/dL (ref 1.7–2.4)

## 2019-03-18 MED ORDER — MIDAZOLAM HCL 2 MG/2ML IJ SOLN
1.0000 mg | INTRAMUSCULAR | Status: DC | PRN
  Administered 2019-03-21: 1 mg via INTRAVENOUS
  Filled 2019-03-18: qty 2

## 2019-03-18 MED ORDER — FENTANYL BOLUS VIA INFUSION
25.0000 ug | INTRAVENOUS | Status: DC | PRN
  Administered 2019-03-20 – 2019-03-22 (×5): 25 ug via INTRAVENOUS
  Filled 2019-03-18: qty 25

## 2019-03-18 MED ORDER — MIDAZOLAM HCL 2 MG/2ML IJ SOLN
1.0000 mg | INTRAMUSCULAR | Status: DC | PRN
  Administered 2019-03-21: 1 mg via INTRAVENOUS
  Filled 2019-03-18 (×2): qty 2

## 2019-03-18 MED ORDER — MIDAZOLAM 50MG/50ML (1MG/ML) PREMIX INFUSION
2.0000 mg/h | INTRAVENOUS | Status: DC
  Administered 2019-03-18 – 2019-03-20 (×6): 5 mg/h via INTRAVENOUS
  Administered 2019-03-20: 02:00:00 3 mg/h via INTRAVENOUS
  Filled 2019-03-18 (×7): qty 50

## 2019-03-18 MED ORDER — OXYCODONE HCL 5 MG PO TABS
5.0000 mg | ORAL_TABLET | ORAL | Status: DC
  Administered 2019-03-18 – 2019-03-19 (×5): 5 mg
  Filled 2019-03-18 (×5): qty 1

## 2019-03-18 MED ORDER — LABETALOL HCL 5 MG/ML IV SOLN
10.0000 mg | INTRAVENOUS | Status: DC | PRN
  Administered 2019-03-21 – 2019-03-23 (×3): 10 mg via INTRAVENOUS
  Filled 2019-03-18 (×4): qty 4

## 2019-03-18 MED ORDER — FUROSEMIDE 10 MG/ML IJ SOLN
40.0000 mg | Freq: Two times a day (BID) | INTRAMUSCULAR | Status: DC
  Administered 2019-03-19: 05:00:00 40 mg via INTRAVENOUS
  Filled 2019-03-18: qty 4

## 2019-03-18 MED ORDER — POTASSIUM CHLORIDE 20 MEQ/15ML (10%) PO SOLN
20.0000 meq | Freq: Once | ORAL | Status: AC
  Administered 2019-03-18: 15:00:00 20 meq
  Filled 2019-03-18: qty 15

## 2019-03-18 MED ORDER — FENTANYL 2500MCG IN NS 250ML (10MCG/ML) PREMIX INFUSION
25.0000 ug/h | INTRAVENOUS | Status: DC
  Administered 2019-03-18: 225 ug/h via INTRAVENOUS
  Administered 2019-03-19 – 2019-03-22 (×4): 175 ug/h via INTRAVENOUS
  Filled 2019-03-18 (×6): qty 250

## 2019-03-18 MED ORDER — POTASSIUM CHLORIDE 20 MEQ/15ML (10%) PO SOLN: 40.0000 meq | Freq: Once | ORAL | Status: AC

## 2019-03-18 MED ORDER — FENTANYL CITRATE (PF) 100 MCG/2ML IJ SOLN: 50.0000 ug | Freq: Once | INTRAMUSCULAR | Status: DC

## 2019-03-18 MED ORDER — FUROSEMIDE 10 MG/ML IJ SOLN
40.0000 mg | Freq: Once | INTRAMUSCULAR | Status: AC
  Administered 2019-03-18: 15:00:00 40 mg via INTRAVENOUS
  Filled 2019-03-18: qty 4

## 2019-03-18 NOTE — Progress Notes (Signed)
NAME:  LINNIE MCGLOCKLIN, MRN:  [id number], DOB:  [birth year], LOS: 7 ADMISSION DATE:  03/11/2019, CONSULTATION DATE:  03/12/19 REFERRING MD:  Dr. Rito Ehrlich, CHIEF COMPLAINT:  SOB   Brief History   63 y/o M admitted 3/31 with reports of fever, cough, SOB and confusion.  He was recently at a wedding mid March and after multiple family members were diagnosed with COVID including his wife.  Developed worsening hypoxemia, respiratory distress 4/1 and PCCM consulted for evaluation.  Intubated 4/2 with ARDS.   Past Medical History  HTN CVA - 2014, 04/2018 HTN  Significant Hospital Events   3/31 Admit, 2L, bilateral patchy infiltrates  4/01 PCCM consulted 4/02 Intubated 4/05 Net positive, remains on paralytic, still prone 4/06 Supine, plateau pressure 23, 50% / 12 PEEP  Consults:  PCCM  Procedures:  ETT 4/2 >> LUE PICC 4/2 >>  L Radial Aline 4/2 >>   Significant Diagnostic Tests:  LDH 3/31 360  Micro Data:  BCx2 3/31 >> granulicatella adiacens 1/2  COVID 3/31 >> positive  RVP 4/1 >>negative Trach asp 4/2 >> COVID trach asp 4/2 >> Positive  Quantiferon Gold 4/6 >>   Antimicrobials:  Azithro 3/31 >> 4/4 Rocephin 3/31 >> 4/4  Interim history/subjective:  Afebrile.  Remains on fentanyl at , versed at 5.  No acute events overnight. 1.5L UOP in last 24 hours / essentially even for 24 hours.  Was on 45% FiO2 but increased per RN due to desaturations.   Objective   Blood pressure 136/66, pulse 79, temperature 98.4 F (36.9 C), resp. rate (!) 30, height  (1.702 m), weight 87.3 kg, SpO2 (!) 89 %.    Vent Mode: PRVC FiO2 (%):  [50 %] 50 % Set Rate:  [30 bmp] 30 bmp Vt Set:  [400 mL] 400 mL PEEP:  [12 cmH20] 12 cmH20 Plateau Pressure:  [24 cmH20-25 cmH20] 25 cmH20   Intake/Output Summary (Last 24 hours) at 03/18/2019 1257 Last data filed at 03/18/2019 4098 Gross per 24 hour  Intake 1315.44 ml  Output 650 ml  Net 665.44 ml   Filed Weights   03/17/19 0400 03/17/19 2000  03/18/19 0400  Weight: 87.7 kg 87.1 kg 87.3 kg    Physical Exam: General: critically ill appearing elderly male lying in bed in NAD HEENT: MM pink/moist, ETT Neuro: sedate CV: s1s2 rrr, no m/r/g PULM: even/non-labored, lungs bilaterally coarse  JX:BJYN, non-tender, bsx4 active  Extremities: warm/dry, trace generalized edema  Skin: no rashes or lesions  Resolved Hospital Problem list   Hypotension Respiratory Alkalosis   Assessment & Plan:   ARDS secondary to COVID-19 -s/p toclizamab x 1 on 4/3 P: PRVC low Vt ventilation, 6cc/kg Follow PaO2/FiO2 ratio, if <150, consider prone positioning  Repeat lasix 40 mg IV x1 Follow CXR, ABG intermittently Add oxycodone 5 mg PT every 4 Wean fentanyl and Versed as tolerated  Hypo / Hypertension P: Monitor off vasopressors  PRN hydralazine   Hypokalemia P: Monitor, replace as indicated  KCL 40 mEq BID PT  DM II  P: SSI Hold home Amaryl and metformin  Hx CVA, HLD P: Supportive care  At Risk Malnutrition  P: TF per Nutrition   Best practice:  Diet: NPO Pain/Anxiety/Delirium protocol (if indicated): n/a VAP protocol (if indicated): n/a DVT prophylaxis: lovenox  GI prophylaxis: n/a  Glucose control: SSI  Mobility: as tolerated  Code Status: Full Code Family Communication: Ex-wife updated via phone 4/7.  Son lives with patient and is also sick / quarantined.  Disposition: ICU  Labs   CBC: Recent Labs  Lab 03/12/19 0709 03/13/19 0522 03/15/19 0500 03/18/19 0513  WBC 6.7 8.6 3.7* 3.9*  NEUTROABS 5.6  --   --   --   HGB 15.4 13.6 9.6* 10.8*  HCT 50.3 42.7 30.5* 35.4*  MCV 87.2 84.1 87.4 88.5  PLT 202 234 229 299    Basic Metabolic Panel: Recent Labs  Lab 03/13/19 0522 03/14/19 0930 03/15/19 0500 03/15/19 1400 03/16/19 0500 03/17/19 1643 03/18/19 0513  NA 140 142 141 141 142  --  146*  K 2.9* 3.4* 2.8* 3.7 3.3*  --  4.0  CL 106 113* 114* 114* 114*  --  114*  CO2 23 21* 20* 18* 19*  --  24   GLUCOSE 135* 98 296* 115* 158*  --  194*  BUN 12 17 13 12 10   --  28*  CREATININE 1.03 1.09 0.89 0.95 0.84  --  1.05  CALCIUM 7.6* 7.0* 6.4* 6.9* 6.9*  --  7.9*  MG 2.3 2.2 2.1  --   --  2.1 2.2  PHOS  --   --   --   --   --  3.5 3.2   GFR: Estimated Creatinine Clearance: 74 mL/min (by C-G formula based on SCr of 1.05 mg/dL). Recent Labs  Lab 03/11/19 1700 03/11/19 1850 03/12/19 0709 03/13/19 0522 03/15/19 0500 03/18/19 0513  PROCALCITON  --  <0.10  --   --   --   --   WBC  --   --  6.7 8.6 3.7* 3.9*  LATICACIDVEN 2.0*  --   --   --   --   --     Liver Function Tests: Recent Labs  Lab 03/12/19 0709 03/15/19 0500  AST 47* 29  ALT 23 12  ALKPHOS 62 49  BILITOT 1.4* 0.5  PROT 6.5 4.7*  ALBUMIN 2.9* 1.6*   No results for input(s): LIPASE, AMYLASE in the last 168 hours. No results for input(s): AMMONIA in the last 168 hours.  ABG    Component Value Date/Time   PHART 7.415 03/17/2019 1500   PCO2ART 39.2 03/17/2019 1500   PO2ART 96.5 03/17/2019 1500   HCO3 24.6 03/17/2019 1500   TCO2 22 04/29/2018 1755   ACIDBASEDEF 0.2 03/16/2019 2015   O2SAT 96.5 03/17/2019 1500     Coagulation Profile: No results for input(s): INR, PROTIME in the last 168 hours.  Cardiac Enzymes: No results for input(s): CKTOTAL, CKMB, CKMBINDEX, TROPONINI in the last 168 hours.  HbA1C: Hgb A1c MFr Bld  Date/Time Value Ref Range Status  03/11/2019 03:38 PM 12.4 (H) 4.8 - 5.6 % Final    Comment:    (NOTE) Pre diabetes:          5.7%-6.4% Diabetes:              >6.4% Glycemic control for   <7.0% adults with diabetes   04/30/2018 01:28 AM 11.5 (H) 4.8 - 5.6 % Final    Comment:    (NOTE) Pre diabetes:          5.7%-6.4% Diabetes:              >6.4% Glycemic control for   <7.0% adults with diabetes     CBG: Recent Labs  Lab 03/17/19 2031 03/18/19 0020 03/18/19 0451 03/18/19 1034 03/18/19 1253  GLUCAP 182* 175* 174* 174* 202*    Critical care time: 40 min    Canary BrimBrandi  Ollis, NP-C [facility]  Pgr: [phone] or if no answer [phone] 03/18/2019, 12:57 PM

## 2019-03-18 NOTE — Progress Notes (Signed)
eLink Physician-Brief Progress Note Patient Name: Benjamin Cohen DOB: 1956/09/08 MRN: [id number]   Date of Service  03/18/2019  HPI/Events of Note  Hypertension - BP = 175/88. Already on Hydralazine 10 mg IV Q 4 hours PRN.  eICU Interventions  Will order: 1. Labetalol 10 mg IV Q 2 hours PRN SBP > 170 or DBP > 100.      Intervention Category Major Interventions: Hypertension - evaluation and management  Veretta Sabourin Eugene 03/18/2019, 10:51 PM

## 2019-03-18 NOTE — Progress Notes (Signed)
All IV infusion lines have been changed and expire on 03/22/19.

## 2019-03-19 LAB — BLOOD GAS, ARTERIAL
Acid-Base Excess: 2.4 mmol/L — ABNORMAL HIGH (ref 0.0–2.0)
Bicarbonate: 26.5 mmol/L (ref 20.0–28.0)
Drawn by: 232811
FIO2: 50
MECHVT: 400 mL
O2 Saturation: 92.6 %
PEEP: 12 cmH2O
Patient temperature: 37.1
RATE: 30 resp/min
pCO2 arterial: 41.5 mmHg (ref 32.0–48.0)
pH, Arterial: 7.422 (ref 7.350–7.450)
pO2, Arterial: 68.1 mmHg — ABNORMAL LOW (ref 83.0–108.0)

## 2019-03-19 LAB — CBC
HCT: 37.7 % — ABNORMAL LOW (ref 39.0–52.0)
Hemoglobin: 11.3 g/dL — ABNORMAL LOW (ref 13.0–17.0)
MCH: 26.9 pg (ref 26.0–34.0)
MCHC: 30 g/dL (ref 30.0–36.0)
MCV: 89.8 fL (ref 80.0–100.0)
Platelets: 306 10*3/uL (ref 150–400)
RBC: 4.2 MIL/uL — ABNORMAL LOW (ref 4.22–5.81)
RDW: 13.9 % (ref 11.5–15.5)
WBC: 4.8 10*3/uL (ref 4.0–10.5)
nRBC: 0.4 % — ABNORMAL HIGH (ref 0.0–0.2)

## 2019-03-19 LAB — BASIC METABOLIC PANEL
Anion gap: 6 (ref 5–15)
BUN: 34 mg/dL — ABNORMAL HIGH (ref 8–23)
CO2: 28 mmol/L (ref 22–32)
Calcium: 8.3 mg/dL — ABNORMAL LOW (ref 8.9–10.3)
Chloride: 112 mmol/L — ABNORMAL HIGH (ref 98–111)
Creatinine, Ser: 1.01 mg/dL (ref 0.61–1.24)
GFR calc Af Amer: >60 mL/min (ref >60)
GFR calc non Af Amer: >60 mL/min (ref >60)
Glucose, Bld: 240 mg/dL — ABNORMAL HIGH (ref 70–99)
Potassium: 4 mmol/L (ref 3.5–5.1)
Sodium: 146 mmol/L — ABNORMAL HIGH (ref 135–145)

## 2019-03-19 LAB — QUANTIFERON-TB GOLD PLUS: QuantiFERON-TB Gold Plus: NEGATIVE

## 2019-03-19 LAB — QUANTIFERON-TB GOLD PLUS (RQFGPL)
QuantiFERON Mitogen Value: 0.82 IU/mL
QuantiFERON Nil Value: 0.24 IU/mL
QuantiFERON TB1 Ag Value: 0.25 IU/mL
QuantiFERON TB2 Ag Value: 0.23 IU/mL

## 2019-03-19 LAB — GLUCOSE, CAPILLARY
Glucose-Capillary: 201 mg/dL — ABNORMAL HIGH (ref 70–99)
Glucose-Capillary: 213 mg/dL — ABNORMAL HIGH (ref 70–99)
Glucose-Capillary: 220 mg/dL — ABNORMAL HIGH (ref 70–99)
Glucose-Capillary: 226 mg/dL — ABNORMAL HIGH (ref 70–99)
Glucose-Capillary: 231 mg/dL — ABNORMAL HIGH (ref 70–99)
Glucose-Capillary: 244 mg/dL — ABNORMAL HIGH (ref 70–99)
Glucose-Capillary: 305 mg/dL — ABNORMAL HIGH (ref 70–99)

## 2019-03-19 MED ORDER — OXYCODONE HCL 5 MG PO TABS
10.0000 mg | ORAL_TABLET | ORAL | Status: DC
  Administered 2019-03-19 – 2019-03-21 (×13): 10 mg
  Filled 2019-03-19 (×13): qty 2

## 2019-03-19 MED ORDER — VITAL HIGH PROTEIN PO LIQD
1000.0000 mL | ORAL | Status: DC
  Administered 2019-03-19 – 2019-03-21 (×4): 1000 mL

## 2019-03-19 MED ORDER — FUROSEMIDE 10 MG/ML IJ SOLN
60.0000 mg | Freq: Two times a day (BID) | INTRAMUSCULAR | Status: DC
  Administered 2019-03-19 – 2019-03-20 (×2): 60 mg via INTRAVENOUS
  Filled 2019-03-19 (×2): qty 6

## 2019-03-19 MED ORDER — FREE WATER
100.0000 mL | Freq: Three times a day (TID) | Status: DC
  Administered 2019-03-19 – 2019-03-23 (×12): 100 mL

## 2019-03-19 MED ORDER — PRO-STAT SUGAR FREE PO LIQD
60.0000 mL | Freq: Every day | ORAL | Status: DC
  Administered 2019-03-20 – 2019-03-23 (×4): 60 mL
  Filled 2019-03-19 (×4): qty 60

## 2019-03-19 NOTE — Progress Notes (Signed)
Inpatient Diabetes Program Recommendations  AACE/ADA: New Consensus Statement on Inpatient Glycemic Control (2015)  Target Ranges:  Prepandial:   less than 140 mg/dL      Peak postprandial:   less than 180 mg/dL (1-2 hours)      Critically ill patients:  140 - 180 mg/dL   Lab Results  Component Value Date   GLUCAP 244 (H) 03/19/2019   HGBA1C 12.4 (H) 03/11/2019      Review of Glycemic Control  Diabetes history: DM2 Outpatient Diabetes medications: Amaryl 1 mg QAM, metformin 1000 mg bid Current orders for Inpatient glycemic control: Novolog 0-15 units tidwc and hs  HgbA1C - 12.4% - uncontrolled. Has been on Lantus in the past. Needs better glycemic control.  Inpatient Diabetes Program Recommendations:     Add Lantus 10 units Q24H. Add Novolog 3 units Q4H.  Will continue to follow closely.  Thank you. Ailene Ards, RD, LDN, CDE Inpatient Diabetes Coordinator [phone]

## 2019-03-19 NOTE — Progress Notes (Signed)
NAME:  Benjamin Cohen, MRN:  [id number], DOB:  04/25/1956, LOS: 8 ADMISSION DATE:  03/11/2019, CONSULTATION DATE:  03/12/19 REFERRING MD:  Dr. Rito EhrlichKrishnan, CHIEF COMPLAINT:  SOB   Brief History   63 y/o M admitted 3/31 with reports of fever, cough, SOB and confusion.  He was recently at a wedding mid March and after multiple family members were diagnosed with COVID including his wife.  Developed worsening hypoxemia, respiratory distress 4/1 and PCCM consulted for evaluation.  Intubated 4/2 with ARDS.   Past Medical History  HTN CVA - 2014, 04/2018 HTN  Significant Hospital Events   3/31 Admit, 2L, bilateral patchy infiltrates  4/01 PCCM consulted 4/02 Intubated 4/05 Net positive, remains on paralytic, still prone 4/06 Supine, plateau pressure 23, 50% / 12 PEEP 4/08 Plateau 23, PEEP 12 / 40%  Consults:  PCCM  Procedures:  ETT 4/2 >> LUE PICC 4/2 >>  L Radial Aline 4/2 >>   Significant Diagnostic Tests:  LDH 3/31 360  Micro Data:  BCx2 3/31 >> granulicatella adiacens 1/2  COVID 3/31 >> positive  RVP 4/1 >>negative Trach asp 4/2 >> COVID trach asp 4/2 >> Positive  Quantiferon Gold 4/6 >>   Antimicrobials:  Azithro 3/31 >> 4/4 Rocephin 3/31 >> 4/4  Interim history/subjective:  RN reports hypernatremia.  No acute events overnight.  Remains on versed 5 / fent 175.    Objective   Blood pressure 120/79, pulse 92, temperature 98.1 F (36.7 C), resp. rate (!) 30, height 5\' 7"  (1.702 m), weight 89.6 kg, SpO2 91 %.    Vent Mode: PRVC FiO2 (%):  [40 %-60 %] 40 % Set Rate:  [30 bmp] 30 bmp Vt Set:  [400 mL] 400 mL PEEP:  [12 cmH20] 12 cmH20 Plateau Pressure:  [23 cmH20-31 cmH20] 31 cmH20   Intake/Output Summary (Last 24 hours) at 03/19/2019 1111 Last data filed at 03/19/2019 0900 Gross per 24 hour  Intake 1858.53 ml  Output 4250 ml  Net -2391.47 ml   Filed Weights   03/17/19 2000 03/18/19 0400 03/19/19 0500  Weight: 87.1 kg 87.3 kg 89.6 kg    Physical Exam: General:  adult male lying in bed on vent, critically ill appearing  HEENT: MM pink/moist, ETT Neuro: sedate, attempts to open eyes with voice CV: s1s2 rrr, no m/r/g PULM: even/non-labored, lungs bilaterally coarse / vent assisted breaths  WU:JWJXGI:soft, non-tender, bsx4 active  Extremities: warm/dry, 2+ generalized edema  Skin: no rashes or lesions  Resolved Hospital Problem list   Hypotension Respiratory Alkalosis   Assessment & Plan:   ARDS secondary to COVID-19 -s/p toclizamab x 1 on 4/3 P: PRVC, low Vt ventilation / on 6cc Follow plateau / driving pressures Pa/FiO2 ratio 136 4/8 Follow CXR / ABG intermittently  Increase Oxycodone to 10 mg PT Q4 Wean fentanyl / versed gtts, cap ceiling  Increase Lasix to 60 mg BID   Hypertension P: Monitor PRN hydralazine   Hypokalemia P: Monitor, replace as indicated  KCL 40 mEq BID PT  DM II  P: SSI  Hold home agents  Hx CVA, HLD P: Supportive care / monitor   At Risk Malnutrition  P: TF per Nutrition    Best practice:  Diet: NPO Pain/Anxiety/Delirium protocol (if indicated): n/a VAP protocol (if indicated): n/a DVT prophylaxis: lovenox  GI prophylaxis: n/a  Glucose control: SSI  Mobility: as tolerated  Code Status: Full Code Family Communication: Ex-wife updated on 4/8 via phone.  Confirmed yesterday with risk management that the family can  pick keys.  Will ask staff to coordinate.  Disposition: ICU  Labs   CBC: Recent Labs  Lab 03/13/19 0522 03/15/19 0500 03/18/19 0513 03/19/19 0500  WBC 8.6 3.7* 3.9* 4.8  HGB 13.6 9.6* 10.8* 11.3*  HCT 42.7 30.5* 35.4* 37.7*  MCV 84.1 87.4 88.5 89.8  PLT 234 229 299 306    Basic Metabolic Panel: Recent Labs  Lab 03/14/19 0930 03/15/19 0500 03/15/19 1400 03/16/19 0500 03/17/19 1643 03/18/19 0513 03/18/19 1712 03/19/19 0500  NA 142 141 141 142  --  146*  --  146*  K 3.4* 2.8* 3.7 3.3*  --  4.0  --  4.0  CL 113* 114* 114* 114*  --  114*  --  112*  CO2 21* 20* 18* 19*   --  24  --  28  GLUCOSE 98 296* 115* 158*  --  194*  --  240*  BUN 17 13 12 10   --  28*  --  34*  CREATININE 1.09 0.89 0.95 0.84  --  1.05  --  1.01  CALCIUM 7.0* 6.4* 6.9* 6.9*  --  7.9*  --  8.3*  MG 2.2 2.1  --   --  2.1 2.2 2.1  --   PHOS  --   --   --   --  3.5 3.2 3.6  --    GFR: Estimated Creatinine Clearance: 77.9 mL/min (by C-G formula based on SCr of 1.01 mg/dL). Recent Labs  Lab 03/13/19 0522 03/15/19 0500 03/18/19 0513 03/19/19 0500  WBC 8.6 3.7* 3.9* 4.8    Liver Function Tests: Recent Labs  Lab 03/15/19 0500  AST 29  ALT 12  ALKPHOS 49  BILITOT 0.5  PROT 4.7*  ALBUMIN 1.6*   No results for input(s): LIPASE, AMYLASE in the last 168 hours. No results for input(s): AMMONIA in the last 168 hours.  ABG    Component Value Date/Time   PHART 7.422 03/19/2019 0400   PCO2ART 41.5 03/19/2019 0400   PO2ART 68.1 (L) 03/19/2019 0400   HCO3 26.5 03/19/2019 0400   TCO2 22 04/29/2018 1755   ACIDBASEDEF 0.2 03/16/2019 2015   O2SAT 92.6 03/19/2019 0400     Coagulation Profile: No results for input(s): INR, PROTIME in the last 168 hours.  Cardiac Enzymes: No results for input(s): CKTOTAL, CKMB, CKMBINDEX, TROPONINI in the last 168 hours.  HbA1C: Hgb A1c MFr Bld  Date/Time Value Ref Range Status  03/11/2019 03:38 PM 12.4 (H) 4.8 - 5.6 % Final    Comment:    (NOTE) Pre diabetes:          5.7%-6.4% Diabetes:              >6.4% Glycemic control for   <7.0% adults with diabetes   04/30/2018 01:28 AM 11.5 (H) 4.8 - 5.6 % Final    Comment:    (NOTE) Pre diabetes:          5.7%-6.4% Diabetes:              >6.4% Glycemic control for   <7.0% adults with diabetes     CBG: Recent Labs  Lab 03/18/19 1711 03/18/19 2043 03/19/19 0014 03/19/19 0504 03/19/19 0803  GLUCAP 201* 206* 226* 220* 213*    Critical care time: 40 minutes    Canary Brim, NP-C [facility] Pgr: [phone] or if no answer [phone] 03/19/2019, 11:11 AM

## 2019-03-19 NOTE — Progress Notes (Signed)
Rwas changing the aline and blood started oozing from the insertion site. RT wasn't able the stop the bleeding and had to pull the aline

## 2019-03-19 NOTE — Progress Notes (Signed)
Nutrition Follow-up  RD working remotely.   DOCUMENTATION CODES:   Obesity unspecified  INTERVENTION:  - will adjust TF regimen to reduce nursing exposure: Vital High Protein @ 50 ml/hr with 60 ml prostat once/day. This regimen will provide 1400 kcal (106% estimated kcal need), 135 grams protein, and 1003 ml free water.  - will d/c multivitamin (ordered by this RD) as TF rate now >45 ml/hr. - free water flush, if desired, to be per MD/NP.   NUTRITION DIAGNOSIS:   Inadequate oral intake related to inability to eat as evidenced by NPO status. -ongoing  GOAL:   Provide needs based on ASPEN/SCCM guidelines with TF regimen  MONITOR:   Vent status, TF tolerance, Weight trends, Labs  ASSESSMENT:   63 year-old male admitted 3/31 with reports of fever, cough, SOB and confusion.  He was recently at a wedding mid-March and after multiple family members were diagnosed with COVID-19. Patient had been found on the floor and unable to get for several days. He developed worsening hypoxemia, respiratory distress 4/1 and PCCM consulted for evaluation. CXR showed patchy bilateral infiltrates. He was screened for COVID in the ED on 3/31 and treated empirically for CAP.  Weight slightly up compared to weight during last assessment (4/6). Estimated nutrition needs remain appropriate. Patient remains intubated with OGT in place and is receiving Vital High Protein @ 40 ml/hr with 60 ml prostat BID. This regimen is providing 1360 kcal (103% estimated kcal need), 144 grams protein, and 102 ml free water.   Per Brandi's note yesterday afternoon: ARDS 2/2 COVID-19, patient has been supine since 4/6.    Patient is currently intubated on ventilator support MV: 11.8 L/min as of 8 AM Temp (24hrs), Avg:98.7 F (37.1 C), Min:98.1 F (36.7 C), Max:99.1 F (37.3 C)   Medications reviewed; 40 mg IV lasix BID, sliding scale novolog, 15 ml liquid multivitamin/day, 20 mEq KCl per OGT x2 doses 4/7. Labs  reviewed; CBGs: 226, 220, and 213 mg/dl today, Na: 878 mmol/l, Cl: 112 mmol/l, Ca: 8.3 mg/dl, BUN: 34 mg/dl.     Diet Order:   Diet Order            Diet NPO time specified  Diet effective now              EDUCATION NEEDS:   Not appropriate for education at this time  Skin:  Skin Assessment: Reviewed RN Assessment  Last BM:  PTA/unknown  Height:   Ht Readings from Last 1 Encounters:  03/11/19 5\' 7"  (1.702 m)    Weight:   Wt Readings from Last 1 Encounters:  03/19/19 89.6 kg    Ideal Body Weight:  67.27 kg  BMI:  Body mass index is 30.94 kg/m.  Estimated Nutritional Needs:   Kcal:  1052-1315 kcal  Protein:  >/= 134 grams  Fluid:  >/= 1.5L/day     Trenton Gammon, MS, RD, LDN, CNSC Inpatient Clinical Dietitian Pager # [phone] After hours/weekend pager # [phone]

## 2019-03-20 LAB — BASIC METABOLIC PANEL
Anion gap: 10 (ref 5–15)
BUN: 39 mg/dL — ABNORMAL HIGH (ref 8–23)
CO2: 30 mmol/L (ref 22–32)
Calcium: 8.8 mg/dL — ABNORMAL LOW (ref 8.9–10.3)
Chloride: 106 mmol/L (ref 98–111)
Creatinine, Ser: 1.02 mg/dL (ref 0.61–1.24)
GFR calc Af Amer: >60 mL/min (ref >60)
GFR calc non Af Amer: >60 mL/min (ref >60)
Glucose, Bld: 282 mg/dL — ABNORMAL HIGH (ref 70–99)
Potassium: 3.8 mmol/L (ref 3.5–5.1)
Sodium: 146 mmol/L — ABNORMAL HIGH (ref 135–145)

## 2019-03-20 LAB — BLOOD GAS, ARTERIAL
Acid-Base Excess: 6.1 mmol/L — ABNORMAL HIGH (ref 0.0–2.0)
Bicarbonate: 30.9 mmol/L — ABNORMAL HIGH (ref 20.0–28.0)
Drawn by: 331471
FIO2: 40
MECHVT: 400 mL
O2 Saturation: 96.8 %
PEEP: 10 cmH2O
Patient temperature: 37.6
RATE: 30 resp/min
pCO2 arterial: 48.5 mmHg — ABNORMAL HIGH (ref 32.0–48.0)
pH, Arterial: 7.424 (ref 7.350–7.450)
pO2, Arterial: 99.2 mmHg (ref 83.0–108.0)

## 2019-03-20 LAB — GLUCOSE, CAPILLARY
Glucose-Capillary: 287 mg/dL — ABNORMAL HIGH (ref 70–99)
Glucose-Capillary: 234 mg/dL — ABNORMAL HIGH (ref 70–99)
Glucose-Capillary: 300 mg/dL — ABNORMAL HIGH (ref 70–99)
Glucose-Capillary: 325 mg/dL — ABNORMAL HIGH (ref 70–99)
Glucose-Capillary: 311 mg/dL — ABNORMAL HIGH (ref 70–99)
Glucose-Capillary: 306 mg/dL — ABNORMAL HIGH (ref 70–99)

## 2019-03-20 MED ORDER — FUROSEMIDE 10 MG/ML IJ SOLN
40.0000 mg | Freq: Two times a day (BID) | INTRAMUSCULAR | Status: DC
  Administered 2019-03-20 – 2019-03-21 (×2): 40 mg via INTRAVENOUS
  Filled 2019-03-20 (×2): qty 4

## 2019-03-20 MED ORDER — INSULIN GLARGINE 100 UNIT/ML ~~LOC~~ SOLN
5.0000 [IU] | Freq: Every day | SUBCUTANEOUS | Status: DC
  Administered 2019-03-21 – 2019-03-22 (×2): 5 [IU] via SUBCUTANEOUS
  Filled 2019-03-20 (×2): qty 0.05

## 2019-03-20 MED ORDER — INSULIN GLARGINE 100 UNIT/ML ~~LOC~~ SOLN
5.0000 [IU] | Freq: Every day | SUBCUTANEOUS | Status: DC
  Filled 2019-03-20: qty 0.05

## 2019-03-20 MED ORDER — ALBUMIN HUMAN 25 % IV SOLN
12.5000 g | Freq: Once | INTRAVENOUS | Status: AC
  Administered 2019-03-20: 13:00:00 12.5 g via INTRAVENOUS
  Filled 2019-03-20: qty 50

## 2019-03-20 MED ORDER — INSULIN GLARGINE 100 UNIT/ML ~~LOC~~ SOLN
5.0000 [IU] | Freq: Once | SUBCUTANEOUS | Status: AC
  Administered 2019-03-20: 5 [IU] via SUBCUTANEOUS
  Filled 2019-03-20: qty 0.05

## 2019-03-20 MED ORDER — DEXMEDETOMIDINE HCL IN NACL 200 MCG/50ML IV SOLN
0.4000 ug/kg/h | INTRAVENOUS | Status: DC
  Administered 2019-03-20 (×2): 0.4 ug/kg/h via INTRAVENOUS
  Administered 2019-03-21: 09:00:00 0.2 ug/kg/h via INTRAVENOUS
  Administered 2019-03-21: 22:00:00 0.6 ug/kg/h via INTRAVENOUS
  Administered 2019-03-21: 15:00:00 0.4 ug/kg/h via INTRAVENOUS
  Administered 2019-03-22: 02:00:00 0.9 ug/kg/h via INTRAVENOUS
  Administered 2019-03-22: 06:00:00 0.5 ug/kg/h via INTRAVENOUS
  Administered 2019-03-22 (×2): 0.4 ug/kg/h via INTRAVENOUS
  Filled 2019-03-20 (×11): qty 50

## 2019-03-20 NOTE — Progress Notes (Signed)
NAME:  Benjamin Cohen, MRN:  [id number], DOB:  October 02, 1956, LOS: 9 ADMISSION DATE:  03/11/2019, CONSULTATION DATE:  03/12/19 REFERRING MD:  Dr. Rito Ehrlich, CHIEF COMPLAINT:  SOB   Brief History   63 y/o M admitted 3/31 with reports of fever, cough, SOB and confusion.  He was recently at a wedding mid March and after multiple family members were diagnosed with COVID including his wife.  Developed worsening hypoxemia, respiratory distress 4/1 and PCCM consulted for evaluation.  Intubated 4/2 with ARDS.   Past Medical History  HTN CVA - 2014, 04/2018 HTN  Significant Hospital Events   3/31 Admit, 2L, bilateral patchy infiltrates  4/01 PCCM consulted 4/02 Intubated 4/05 Net positive, remains on paralytic, still prone 4/06 Supine, plateau pressure 23, 50% / 12 PEEP 4/08 Plateau 23, PEEP 12 / 40%  Consults:  PCCM  Procedures:  ETT 4/2 >>  LUE PICC 4/2 >>  L Radial Aline 4/2 >>   Significant Diagnostic Tests:  LDH 3/31 360  Micro Data:  BCx2 3/31 >> granulicatella adiacens 1/2  COVID 3/31 >> positive  RVP 4/1 >>negative Trach asp 4/2 >> COVID trach asp 4/2 >> Positive  Quantiferon Gold 4/6 >>   Antimicrobials:  Azithro 3/31 >> 4/4 Rocephin 3/31 >> 4/4  Interim history/subjective:   RN reports FiO2 weaned to 35%, PEEP 10.  Sedation changed to precedex this am.   Objective   Blood pressure 105/77, pulse (!) 114, temperature 98.1 F (36.7 C), resp. rate (!) 30, height  (1.702 m), weight 83.8 kg, SpO2 97 %.    Vent Mode: PRVC FiO2 (%):  [30 %-40 %] 40 % Set Rate:  [30 bmp] 30 bmp Vt Set:  [400 mL] 400 mL PEEP:  [10 cmH20-12 cmH20] 10 cmH20 Plateau Pressure:  [22 cmH20-29 cmH20] 29 cmH20   Intake/Output Summary (Last 24 hours) at 03/20/2019 1133 Last data filed at 03/20/2019 0900 Gross per 24 hour  Intake 1857.54 ml  Output 3400 ml  Net -1542.46 ml   Filed Weights   03/18/19 0400 03/19/19 0500 03/20/19 0500  Weight: 87.3 kg 89.6 kg 83.8 kg    Physical Exam:  General: adult male lying in bed on vent, critically ill appearing  HEENT: MM pink/moist, ETT Neuro: Sedate, opens eyes to voice  CV: s1s2 rrr, no m/r/g PULM: even/non-labored, lungs bilaterally clear anterior / difficult to auscultate due to ambient noise in room  OZ:HYQM, non-tender, bsx4 active  Extremities: warm/dry, 1-2+ generalized edema  Skin: no rashes or lesions  Resolved Hospital Problem list   Hypotension Respiratory Alkalosis   Assessment & Plan:   ARDS secondary to COVID-19 -s/p toclizamab x 1 on 4/3 P: PRVC 6cc/kg, low VT ventilation.  May be nearing a point to liberalize Vt.  Wean PEEP / FiO2 for sats > 90% Follow CXR / ABG intermittently  Stop versed  Continue precedex + fentanyl  Lasix to 40 mg BID (put out 4500 on 60 BID)  Hypotension  -suspect sedation related with addition of precedex to versed, fentanyl  P: Stop versed  Albumin x1  Reduce lasix to 40 BID   Hx Hypertension P: PRN hydralazine if needed  Hypokalemia At Risk AKI  P: Trend BMP / urinary output Replace electrolytes as indicated Avoid nephrotoxic agents, ensure adequate renal perfusion  DM II  Hyperglycemia  P: SSI  Add lantus 5 units QD Home hold agents   Hx CVA, HLD P: Supportive care / monitor   At Risk Malnutrition  P: TF  Best practice:  Diet: NPO Pain/Anxiety/Delirium protocol (if indicated): n/a VAP protocol (if indicated): n/a DVT prophylaxis: lovenox  GI prophylaxis: n/a  Glucose control: SSI  Mobility: as tolerated  Code Status: Full Code Family Communication: Will call ex-wife for update.  Staff looking for patients keys (family had requested in order to move patients truck.  Cleared per management with risk management.  Disposition: ICU  Labs   CBC: Recent Labs  Lab 03/15/19 0500 03/18/19 0513 03/19/19 0500  WBC 3.7* 3.9* 4.8  HGB 9.6* 10.8* 11.3*  HCT 30.5* 35.4* 37.7*  MCV 87.4 88.5 89.8  PLT 229 299 306    Basic Metabolic Panel:  Recent Labs  Lab 03/14/19 0930 03/15/19 0500 03/15/19 1400 03/16/19 0500 03/17/19 1643 03/18/19 0513 03/18/19 1712 03/19/19 0500 03/20/19 0618  NA 142 141 141 142  --  146*  --  146* 146*  K 3.4* 2.8* 3.7 3.3*  --  4.0  --  4.0 3.8  CL 113* 114* 114* 114*  --  114*  --  112* 106  CO2 21* 20* 18* 19*  --  24  --  28 30  GLUCOSE 98 296* 115* 158*  --  194*  --  240* 282*  BUN 17 13 12 10   --  28*  --  34* 39*  CREATININE 1.09 0.89 0.95 0.84  --  1.05  --  1.01 1.02  CALCIUM 7.0* 6.4* 6.9* 6.9*  --  7.9*  --  8.3* 8.8*  MG 2.2 2.1  --   --  2.1 2.2 2.1  --   --   PHOS  --   --   --   --  3.5 3.2 3.6  --   --    GFR: Estimated Creatinine Clearance: 74.8 mL/min (by C-G formula based on SCr of 1.02 mg/dL). Recent Labs  Lab 03/15/19 0500 03/18/19 0513 03/19/19 0500  WBC 3.7* 3.9* 4.8    Liver Function Tests: Recent Labs  Lab 03/15/19 0500  AST 29  ALT 12  ALKPHOS 49  BILITOT 0.5  PROT 4.7*  ALBUMIN 1.6*   No results for input(s): LIPASE, AMYLASE in the last 168 hours. No results for input(s): AMMONIA in the last 168 hours.  ABG    Component Value Date/Time   PHART 7.424 03/20/2019 1000   PCO2ART 48.5 (H) 03/20/2019 1000   PO2ART 99.2 03/20/2019 1000   HCO3 30.9 (H) 03/20/2019 1000   TCO2 22 04/29/2018 1755   ACIDBASEDEF 0.2 03/16/2019 2015   O2SAT 96.8 03/20/2019 1000     Coagulation Profile: No results for input(s): INR, PROTIME in the last 168 hours.  Cardiac Enzymes: No results for input(s): CKTOTAL, CKMB, CKMBINDEX, TROPONINI in the last 168 hours.  HbA1C: Hgb A1c MFr Bld  Date/Time Value Ref Range Status  03/11/2019 03:38 PM 12.4 (H) 4.8 - 5.6 % Final    Comment:    (NOTE) Pre diabetes:          5.7%-6.4% Diabetes:              >6.4% Glycemic control for   <7.0% adults with diabetes   04/30/2018 01:28 AM 11.5 (H) 4.8 - 5.6 % Final    Comment:    (NOTE) Pre diabetes:          5.7%-6.4% Diabetes:              >6.4% Glycemic control for    <7.0% adults with diabetes     CBG: Recent Labs  Lab 03/19/19 1721 03/19/19 2123 03/20/19 0105 03/20/19 0446 03/20/19 0808  GLUCAP 244* 305* 287* 234* 300*    Critical care time: 40 minutes    Canary BrimBrandi , NP-C [facility] Pgr: [phone] or if no answer [phone] 03/20/2019, 11:33 AM

## 2019-03-21 ENCOUNTER — Inpatient Hospital Stay (HOSPITAL_COMMUNITY): Payer: 59

## 2019-03-21 LAB — BASIC METABOLIC PANEL
Anion gap: 12 (ref 5–15)
BUN: 48 mg/dL — ABNORMAL HIGH (ref 8–23)
CO2: 30 mmol/L (ref 22–32)
Calcium: 9.4 mg/dL (ref 8.9–10.3)
Chloride: 106 mmol/L (ref 98–111)
Creatinine, Ser: 1.03 mg/dL (ref 0.61–1.24)
GFR calc Af Amer: >60 mL/min (ref >60)
GFR calc non Af Amer: >60 mL/min (ref >60)
Glucose, Bld: 337 mg/dL — ABNORMAL HIGH (ref 70–99)
Potassium: 4 mmol/L (ref 3.5–5.1)
Sodium: 148 mmol/L — ABNORMAL HIGH (ref 135–145)

## 2019-03-21 LAB — GLUCOSE, CAPILLARY
Glucose-Capillary: 302 mg/dL — ABNORMAL HIGH (ref 70–99)
Glucose-Capillary: 300 mg/dL — ABNORMAL HIGH (ref 70–99)
Glucose-Capillary: 287 mg/dL — ABNORMAL HIGH (ref 70–99)
Glucose-Capillary: 357 mg/dL — ABNORMAL HIGH (ref 70–99)
Glucose-Capillary: 327 mg/dL — ABNORMAL HIGH (ref 70–99)
Glucose-Capillary: 294 mg/dL — ABNORMAL HIGH (ref 70–99)

## 2019-03-21 LAB — CBC
HCT: 41.1 % (ref 39.0–52.0)
Hemoglobin: 12.1 g/dL — ABNORMAL LOW (ref 13.0–17.0)
MCH: 26.5 pg (ref 26.0–34.0)
MCHC: 29.4 g/dL — ABNORMAL LOW (ref 30.0–36.0)
MCV: 90.1 fL (ref 80.0–100.0)
Platelets: 324 10*3/uL (ref 150–400)
RBC: 4.56 MIL/uL (ref 4.22–5.81)
RDW: 13.6 % (ref 11.5–15.5)
WBC: 6.2 10*3/uL (ref 4.0–10.5)
nRBC: 0 % (ref 0.0–0.2)

## 2019-03-21 MED ORDER — MIDAZOLAM HCL 2 MG/2ML IJ SOLN
1.0000 mg | INTRAMUSCULAR | Status: DC | PRN
  Administered 2019-03-21 – 2019-03-22 (×2): 2 mg via INTRAVENOUS
  Administered 2019-03-22 (×2): 1 mg via INTRAVENOUS
  Filled 2019-03-21 (×2): qty 2

## 2019-03-21 MED ORDER — OXYCODONE HCL 5 MG PO TABS
5.0000 mg | ORAL_TABLET | Freq: Four times a day (QID) | ORAL | Status: DC
  Administered 2019-03-21 – 2019-03-23 (×6): 5 mg
  Filled 2019-03-21 (×6): qty 1

## 2019-03-21 MED ORDER — SENNOSIDES 8.8 MG/5ML PO SYRP
10.0000 mL | ORAL_SOLUTION | Freq: Every day | ORAL | Status: DC
  Administered 2019-03-21: 18:00:00 10 mL
  Filled 2019-03-21 (×4): qty 10

## 2019-03-21 MED ORDER — OXYCODONE HCL 5 MG PO TABS: 5.0000 mg | ORAL_TABLET | ORAL | Status: DC

## 2019-03-21 MED ORDER — FUROSEMIDE 10 MG/ML IJ SOLN
40.0000 mg | Freq: Every day | INTRAMUSCULAR | Status: DC
  Administered 2019-03-22 – 2019-03-25 (×4): 40 mg via INTRAVENOUS
  Filled 2019-03-21 (×5): qty 4

## 2019-03-21 NOTE — Progress Notes (Signed)
NAME:  Benjamin Cohen, MRN:  [id number], DOB:  08/04/1956, LOS: 10 ADMISSION DATE:  03/11/2019, CONSULTATION DATE:  03/12/19 REFERRING MD:  Dr. Rito Ehrlich, CHIEF COMPLAINT:  SOB   Brief History   63 y/o M admitted 3/31 with reports of fever, cough, SOB and confusion.  He was recently at a wedding mid March and after multiple family members were diagnosed with COVID including his wife.  Developed worsening hypoxemia, respiratory distress 4/1 and PCCM consulted for evaluation.  Intubated 4/2 with ARDS.   Past Medical History  HTN CVA - 2014, 04/2018 HTN  Significant Hospital Events   3/31 Admit, 2L, bilateral patchy infiltrates  4/01 PCCM consulted 4/02 Intubated 4/05 Net positive, remains on paralytic, still prone 4/06 Supine, plateau pressure 23, 50% / 12 PEEP 4/08 Plateau 23, PEEP 12 / 40%  Consults:  PCCM  Procedures:  ETT 4/2 >>  LUE PICC 4/2 >>  L Radial Aline 4/2 >> out   Significant Diagnostic Tests:  LDH 3/31 360  Micro Data:  BCx2 3/31 >> granulicatella adiacens 1/2  COVID 3/31 >> positive  RVP 4/1 >> negative Trach asp 4/2 >> COVID trach asp 4/2 >> Positive  Quantiferon Gold 4/6 >>   Antimicrobials:  Azithro 3/31 >> 4/4 Rocephin 3/31 >> 4/4  Interim history/subjective:  RN reports superficial abrasion on the back of neck, dressing applied.  On 35% FiO2.    Objective   Blood pressure 125/79, pulse 81, temperature (!) 97.5 F (36.4 C), resp. rate (!) 30, height  (1.702 m), weight 83.8 kg, SpO2 94 %.    Vent Mode: PRVC FiO2 (%):  [35 %] 35 % Set Rate:  [30 bmp] 30 bmp Vt Set:  [400 mL] 400 mL PEEP:  [8 cmH20-10 cmH20] 8 cmH20 Plateau Pressure:  [15 cmH20-22 cmH20] 21 cmH20   Intake/Output Summary (Last 24 hours) at 03/21/2019 1144 Last data filed at 03/21/2019 1100 Gross per 24 hour  Intake 2515.71 ml  Output 3375 ml  Net -859.29 ml   Filed Weights   03/18/19 0400 03/19/19 0500 03/20/19 0500  Weight: 87.3 kg 89.6 kg 83.8 kg    Physical Exam:  General: adult male on vent, ill appearing  HEENT: MM pink/moist, ETT  Neuro: awakens to voice, nods, does not follow commands / unable to hold arm up against gravity  CV: s1s2 rrr, no m/r/g PULM: even/non-labored, lungs bilaterally clear anterior, diminished bases  WN:UUVO, non-tender, bsx4 active  Extremities: warm/dry, 1+ edema  Skin: no rashes or lesions  Resolved Hospital Problem list   Hypotension Respiratory Alkalosis   Assessment & Plan:   ARDS secondary to COVID-19 -s/p toclizamab x 1 on 4/3 P: PRVC 6cc/kg low Vt ventilation  Trial of PSV ventilation  May be nearing point of liberalizing Vt  Follow CXR intermittently  Continue Precedex, fentanyl  Reduce oxycodone to  Q6  Hypotension  -suspect sedation related with addition of precedex to versed, fentanyl  P: Resolved  Hx Hypertension P: PRN hydralazine  Reduce lasix to 40 mg QD  Hypokalemia At Risk AKI  P: Trend BMP / urinary output Replace electrolytes as indicated Avoid nephrotoxic agents, ensure adequate renal perfusion  DM II  Hyperglycemia  P: SSI  Increase lantus to 10 untis QD Hold home agents   Hx CVA, HLD P: Supportive care / monitor   At Risk Malnutrition  P: TF  Best practice:  Diet: NPO Pain/Anxiety/Delirium protocol (if indicated): n/a VAP protocol (if indicated): n/a DVT prophylaxis: lovenox  GI  prophylaxis: n/a  Glucose control: SSI  Mobility: as tolerated  Code Status: Full Code Family Communication: Ex-wife updated on patients status 4/10  Disposition: ICU  Labs   CBC: Recent Labs  Lab 03/15/19 0500 03/18/19 0513 03/19/19 0500 03/21/19 0500  WBC 3.7* 3.9* 4.8 6.2  HGB 9.6* 10.8* 11.3* 12.1*  HCT 30.5* 35.4* 37.7* 41.1  MCV 87.4 88.5 89.8 90.1  PLT 229 299 306 324    Basic Metabolic Panel: Recent Labs  Lab 03/15/19 0500  03/16/19 0500 03/17/19 1643 03/18/19 0513 03/18/19 1712 03/19/19 0500 03/20/19 0618 03/21/19 0500  NA 141   < > 142  --  146*   --  146* 146* 148*  K 2.8*   < > 3.3*  --  4.0  --  4.0 3.8 4.0  CL 114*   < > 114*  --  114*  --  112* 106 106  CO2 20*   < > 19*  --  24  --  28 30 30   GLUCOSE 296*   < > 158*  --  194*  --  240* 282* 337*  BUN 13   < > 10  --  28*  --  34* 39* 48*  CREATININE 0.89   < > 0.84  --  1.05  --  1.01 1.02 1.03  CALCIUM 6.4*   < > 6.9*  --  7.9*  --  8.3* 8.8* 9.4  MG 2.1  --   --  2.1 2.2 2.1  --   --   --   PHOS  --   --   --  3.5 3.2 3.6  --   --   --    < > = values in this interval not displayed.   GFR: Estimated Creatinine Clearance: 74 mL/min (by C-G formula based on SCr of 1.03 mg/dL). Recent Labs  Lab 03/15/19 0500 03/18/19 0513 03/19/19 0500 03/21/19 0500  WBC 3.7* 3.9* 4.8 6.2    Liver Function Tests: Recent Labs  Lab 03/15/19 0500  AST 29  ALT 12  ALKPHOS 49  BILITOT 0.5  PROT 4.7*  ALBUMIN 1.6*   No results for input(s): LIPASE, AMYLASE in the last 168 hours. No results for input(s): AMMONIA in the last 168 hours.  ABG    Component Value Date/Time   PHART 7.424 03/20/2019 1000   PCO2ART 48.5 (H) 03/20/2019 1000   PO2ART 99.2 03/20/2019 1000   HCO3 30.9 (H) 03/20/2019 1000   TCO2 22 04/29/2018 1755   ACIDBASEDEF 0.2 03/16/2019 2015   O2SAT 96.8 03/20/2019 1000     Coagulation Profile: No results for input(s): INR, PROTIME in the last 168 hours.  Cardiac Enzymes: No results for input(s): CKTOTAL, CKMB, CKMBINDEX, TROPONINI in the last 168 hours.  HbA1C: Hgb A1c MFr Bld  Date/Time Value Ref Range Status  03/11/2019 03:38 PM 12.4 (H) 4.8 - 5.6 % Final    Comment:    (NOTE) Pre diabetes:          5.7%-6.4% Diabetes:              >6.4% Glycemic control for   <7.0% adults with diabetes   04/30/2018 01:28 AM 11.5 (H) 4.8 - 5.6 % Final    Comment:    (NOTE) Pre diabetes:          5.7%-6.4% Diabetes:              >6.4% Glycemic control for   <7.0% adults with diabetes  CBG: Recent Labs  Lab 03/20/19 2125 03/21/19 0203 03/21/19 0502  03/21/19 0744 03/21/19 1108  GLUCAP 306* 302* 300* 287* 357*    Critical care time: 35 minutes    Canary Brim, NP-C [facility] Pgr: [phone] or if no answer [phone] 03/21/2019, 11:44 AM

## 2019-03-21 NOTE — Progress Notes (Signed)
Assisted tele visit to patient with daughter.  Pascale Maves Anne, RN  

## 2019-03-22 LAB — BASIC METABOLIC PANEL
Anion gap: 11 (ref 5–15)
BUN: 50 mg/dL — ABNORMAL HIGH (ref 8–23)
CO2: 28 mmol/L (ref 22–32)
Calcium: 8.8 mg/dL — ABNORMAL LOW (ref 8.9–10.3)
Chloride: 108 mmol/L (ref 98–111)
Creatinine, Ser: 1.16 mg/dL (ref 0.61–1.24)
GFR calc Af Amer: >60 mL/min (ref >60)
GFR calc non Af Amer: >60 mL/min (ref >60)
Glucose, Bld: 320 mg/dL — ABNORMAL HIGH (ref 70–99)
Potassium: 4.4 mmol/L (ref 3.5–5.1)
Sodium: 147 mmol/L — ABNORMAL HIGH (ref 135–145)

## 2019-03-22 LAB — GLUCOSE, CAPILLARY
Glucose-Capillary: 321 mg/dL — ABNORMAL HIGH (ref 70–99)
Glucose-Capillary: 302 mg/dL — ABNORMAL HIGH (ref 70–99)
Glucose-Capillary: 255 mg/dL — ABNORMAL HIGH (ref 70–99)
Glucose-Capillary: 332 mg/dL — ABNORMAL HIGH (ref 70–99)
Glucose-Capillary: 267 mg/dL — ABNORMAL HIGH (ref 70–99)
Glucose-Capillary: 274 mg/dL — ABNORMAL HIGH (ref 70–99)

## 2019-03-22 LAB — CBC
HCT: 38.5 % — ABNORMAL LOW (ref 39.0–52.0)
Hemoglobin: 11.6 g/dL — ABNORMAL LOW (ref 13.0–17.0)
MCH: 27 pg (ref 26.0–34.0)
MCHC: 30.1 g/dL (ref 30.0–36.0)
MCV: 89.5 fL (ref 80.0–100.0)
Platelets: 301 10*3/uL (ref 150–400)
RBC: 4.3 MIL/uL (ref 4.22–5.81)
RDW: 13.7 % (ref 11.5–15.5)
WBC: 5.4 10*3/uL (ref 4.0–10.5)
nRBC: 0 % (ref 0.0–0.2)

## 2019-03-22 MED ORDER — FENTANYL BOLUS VIA INFUSION
25.0000 ug | INTRAVENOUS | Status: DC | PRN
  Administered 2019-03-22: 50 ug via INTRAVENOUS
  Administered 2019-03-22: 25 ug via INTRAVENOUS
  Filled 2019-03-22: qty 50

## 2019-03-22 MED ORDER — DEXMEDETOMIDINE HCL IN NACL 400 MCG/100ML IV SOLN
0.4000 ug/kg/h | INTRAVENOUS | Status: DC
  Administered 2019-03-22: 21:00:00 0.4 ug/kg/h via INTRAVENOUS
  Administered 2019-03-23: 18:00:00 0.2 ug/kg/h via INTRAVENOUS
  Administered 2019-03-24: 0.5 ug/kg/h via INTRAVENOUS
  Filled 2019-03-22 (×4): qty 100

## 2019-03-22 MED ORDER — INSULIN GLARGINE 100 UNIT/ML ~~LOC~~ SOLN
10.0000 [IU] | Freq: Every day | SUBCUTANEOUS | Status: DC
  Administered 2019-03-23 – 2019-03-27 (×5): 10 [IU] via SUBCUTANEOUS
  Filled 2019-03-22 (×5): qty 0.1

## 2019-03-22 NOTE — Progress Notes (Signed)
NAME:  Benjamin Cohen, MRN:  [id number], DOB:  10-23-1956, LOS: 11 ADMISSION DATE:  03/11/2019, CONSULTATION DATE:  03/12/19 REFERRING MD:  Dr. Rito Ehrlich, CHIEF COMPLAINT:  SOB   Brief History   63 y/o M admitted 3/31 with reports of fever, cough, SOB and confusion.  He was recently at a wedding mid March and after multiple family members were diagnosed with COVID including his wife.  Developed worsening hypoxemia, respiratory distress 4/1 and PCCM consulted for evaluation.  Intubated 4/2 with ARDS.   Past Medical History  HTN CVA - 2014, 04/2018 HTN  Significant Hospital Events   3/31 Admit, 2L, bilateral patchy infiltrates  4/01 PCCM consulted 4/02 Intubated 4/05 Net positive, remains on paralytic, still prone 4/06 Supine, plateau pressure 23, 50% / 12 PEEP 4/08 Plateau 23, PEEP 12 / 40%  Consults:  PCCM  Procedures:  ETT 4/2 >>  LUE PICC 4/2 >>  L Radial Aline 4/2 >> out   Significant Diagnostic Tests:  LDH 3/31 360  Micro Data:  BCx2 3/31 >> granulicatella adiacens 1/2  COVID 3/31 >> positive  RVP 4/1 >> negative Trach asp 4/2 >> COVID trach asp 4/2 >> Positive  Quantiferon Gold 4/6 >>   Antimicrobials:  Azithro 3/31 >> 4/4 Rocephin 3/31 >> 4/4  Interim history/subjective:  Tolerated SBT x 4 hours yesterday. Transitioned to full support for overnight to rest. Tolerating SBT this morning. RT reports thin clear secretions which is improved compared to three days ago.   Objective   Blood pressure 99/69, pulse 87, temperature 98.6 F (37 C), resp. rate (!) 25, height 5\' 7"  (1.702 m), weight 79.6 kg, SpO2 97 %.    Vent Mode: PRVC FiO2 (%):  [35 %] 35 % Set Rate:  [30 bmp] 30 bmp Vt Set:  [400 mL] 400 mL PEEP:  [8 cmH20] 8 cmH20 Pressure Support:  [5 cmH20] 5 cmH20 Plateau Pressure:  [20 cmH20-22 cmH20] 22 cmH20   Intake/Output Summary (Last 24 hours) at 03/22/2019 1105 Last data filed at 03/22/2019 1000 Gross per 24 hour  Intake 2029.25 ml  Output 3425 ml  Net  -1395.75 ml   Filed Weights   03/19/19 0500 03/20/19 0500 03/22/19 0500  Weight: 89.6 kg 83.8 kg 79.6 kg   Physical Exam: General: Ill-appearing male, laying in bed, drowsy on sedation HENT: [place], AT, ETT in place Eyes: EOMI, no scleral icterus Respiratory: Non-labored, vented breath sounds Cardiovascular: RRR, -M/R/G, no JVD GI: BS+, soft, nontender Extremities:-Edema,-tenderness Neuro: Drowsy, follows commands Skin: Intact, no rashes or bruising GU: Foley in place  Resolved Hospital Problem list   Hypotension Respiratory Alkalosis   Assessment & Plan:   ARDS secondary to COVID-19 -s/p toclizamab x 1 on 4/3 -s/p plaquenil x 5d -s/p ceftriaxone and azithro x 5d P: SBT as tolerated x 2 hours however became tachypneic  On full vent support Plan to repeat SBT this afternoon and return to full vent overnight If tolerates SBT tomorrow morning, plan to extubate Wean sedation Diuresis as tolerated   Agitation requiring sedation P: Reduce oxycodone to 5mg  Q6 Wean sedation for RASS goal -3  Hx Hypertension P: PRN hydralazine   Hypernatremia P: Free water  DM II  Hyperglycemia  P: SSI  Increase lantus to 10 untis QD Hold home agents   Hx CVA, HLD P: Supportive care / monitor   At Risk Malnutrition  P: TF  Best practice:  Diet: TF Pain/Anxiety/Delirium protocol (if indicated): n/a VAP protocol (if indicated): n/a DVT prophylaxis: lovenox  GI prophylaxis: n/a  Glucose control: SSI  Mobility: as tolerated  Code Status: Full Code Family Communication: [name], ex-wife, and daughter updated on 4/11 Disposition: ICU  Labs   CBC: Recent Labs  Lab 03/18/19 0513 03/19/19 0500 03/21/19 0500 03/22/19 0500  WBC 3.9* 4.8 6.2 5.4  HGB 10.8* 11.3* 12.1* 11.6*  HCT 35.4* 37.7* 41.1 38.5*  MCV 88.5 89.8 90.1 89.5  PLT 299 306 324 301    Basic Metabolic Panel: Recent Labs  Lab 03/17/19 1643 03/18/19 0513 03/18/19 1712 03/19/19 0500 03/20/19 0618  03/21/19 0500 03/22/19 0500  NA  --  146*  --  146* 146* 148* 147*  K  --  4.0  --  4.0 3.8 4.0 4.4  CL  --  114*  --  112* 106 106 108  CO2  --  24  --  28 30 30 28   GLUCOSE  --  194*  --  240* 282* 337* 320*  BUN  --  28*  --  34* 39* 48* 50*  CREATININE  --  1.05  --  1.01 1.02 1.03 1.16  CALCIUM  --  7.9*  --  8.3* 8.8* 9.4 8.8*  MG 2.1 2.2 2.1  --   --   --   --   PHOS 3.5 3.2 3.6  --   --   --   --    GFR: Estimated Creatinine Clearance: 64.2 mL/min (by C-G formula based on SCr of 1.16 mg/dL). Recent Labs  Lab 03/18/19 0513 03/19/19 0500 03/21/19 0500 03/22/19 0500  WBC 3.9* 4.8 6.2 5.4    Liver Function Tests: No results for input(s): AST, ALT, ALKPHOS, BILITOT, PROT, ALBUMIN in the last 168 hours. No results for input(s): LIPASE, AMYLASE in the last 168 hours. No results for input(s): AMMONIA in the last 168 hours.  ABG    Component Value Date/Time   PHART 7.424 03/20/2019 1000   PCO2ART 48.5 (H) 03/20/2019 1000   PO2ART 99.2 03/20/2019 1000   HCO3 30.9 (H) 03/20/2019 1000   TCO2 22 04/29/2018 1755   ACIDBASEDEF 0.2 03/16/2019 2015   O2SAT 96.8 03/20/2019 1000     Coagulation Profile: No results for input(s): INR, PROTIME in the last 168 hours.  Cardiac Enzymes: No results for input(s): CKTOTAL, CKMB, CKMBINDEX, TROPONINI in the last 168 hours.  HbA1C: Hgb A1c MFr Bld  Date/Time Value Ref Range Status  03/11/2019 03:38 PM 12.4 (H) 4.8 - 5.6 % Final    Comment:    (NOTE) Pre diabetes:          5.7%-6.4% Diabetes:              >6.4% Glycemic control for   <7.0% adults with diabetes   04/30/2018 01:28 AM 11.5 (H) 4.8 - 5.6 % Final    Comment:    (NOTE) Pre diabetes:          5.7%-6.4% Diabetes:              >6.4% Glycemic control for   <7.0% adults with diabetes     CBG: Recent Labs  Lab 03/21/19 1521 03/21/19 2000 03/22/19 0104 03/22/19 0535 03/22/19 0744  GLUCAP 327* 294* 321* 302* 255*    Critical care time: 48 minutes    Mechele CollinJane  Ellison, M.D. [facility] Pager: [phone] After hours pager: [phone]

## 2019-03-22 NOTE — Progress Notes (Signed)
Wean stopped and placed back on full support, patient triggers high pressure alarms instantaneously  Attempted to suction and patient was biting on ETT, after several verbal cues to stop biting patient is given medication by nursing per E-link MD and patient releases bite on ETT.Marland Kitchen

## 2019-03-22 NOTE — Progress Notes (Signed)
NAME:  Benjamin Cohen, MRN:  [id number], DOB:  1956-01-19, LOS: 11 ADMISSION DATE:  03/11/2019, CONSULTATION DATE:  03/12/19 REFERRING MD:  Dr. Rito Ehrlich, CHIEF COMPLAINT:  SOB   Brief History   63 y/o M admitted 3/31 with reports of fever, cough, SOB and confusion.  He was recently at a wedding mid March and after multiple family members were diagnosed with COVID including his wife.  Developed worsening hypoxemia, respiratory distress 4/1 and PCCM consulted for evaluation.  Intubated 4/2 with ARDS.   Past Medical History  HTN CVA - 2014, 04/2018 HTN  Significant Hospital Events   3/31 Admit, 2L, bilateral patchy infiltrates  4/01 PCCM consulted 4/02 Intubated 4/05 Net positive, remains on paralytic, still prone 4/06 Supine, plateau pressure 23, 50% / 12 PEEP 4/08 Plateau 23, PEEP 12 / 40%  Consults:  PCCM  Procedures:  ETT 4/2 >>  LUE PICC 4/2 >>  L Radial Aline 4/2 >> out   Significant Diagnostic Tests:  LDH 3/31 360  Micro Data:  BCx2 3/31 >> granulicatella adiacens 1/2  COVID 3/31 >> positive  RVP 4/1 >> negative Trach asp 4/2 >> COVID trach asp 4/2 >> Positive  Quantiferon Gold 4/6 >>   Antimicrobials:  Azithro 3/31 >> 4/4 Rocephin 3/31 >> 4/4  Interim history/subjective:  Tolerated SBT x 4 hours yesterday. Transitioned to full support for overnight to rest. Tolerating SBT this morning. RT reports thin clear secretions which is improved compared to three days ago.   Objective   Blood pressure 91/63, pulse 79, temperature 100.2 F (37.9 C), resp. rate (!) 30, height 5\' 7"  (1.702 m), weight 79.6 kg, SpO2 92 %.    Vent Mode: PRVC FiO2 (%):  [35 %] 35 % Set Rate:  [30 bmp] 30 bmp Vt Set:  [400 mL] 400 mL PEEP:  [8 cmH20] 8 cmH20 Pressure Support:  [5 cmH20] 5 cmH20 Plateau Pressure:  [20 cmH20-22 cmH20] 22 cmH20   Intake/Output Summary (Last 24 hours) at 03/22/2019 0836 Last data filed at 03/22/2019 0700 Gross per 24 hour  Intake 1977.28 ml  Output 2225 ml   Net -247.72 ml   Filed Weights   03/19/19 0500 03/20/19 0500 03/22/19 0500  Weight: 89.6 kg 83.8 kg 79.6 kg   Physical Exam: General: Ill-appearing male, laying in bed, drowsy on sedation HENT: [name], AT, ETT in place Eyes: EOMI, no scleral icterus Respiratory: Non-labored, vented breath sounds Cardiovascular: RRR, -M/R/G, no JVD GI: BS+, soft, nontender Extremities:-Edema,-tenderness Neuro: Drowsy, follows commands Skin: Intact, no rashes or bruising GU: Foley in place  Resolved Hospital Problem list   Hypotension Respiratory Alkalosis   Assessment & Plan:   ARDS secondary to COVID-19 -s/p toclizamab x 1 on 4/3 -s/p plaquenil x 5d -s/p ceftriaxone and azithro x 5d P: If am SBT tolerated, will plan to extubate Wean sedation Diuresis as tolerated   Agitation requiring sedation P: Reduce oxycodone to 5mg  Q6 Wean sedation in anticipation of extubation  Hx Hypertension P: PRN hydralazine   Hypernatremia P: Free water  DM II  Hyperglycemia  P: SSI  Increase lantus to 10 untis QD Hold home agents   Hx CVA, HLD P: Supportive care / monitor   At Risk Malnutrition  P: TF  Best practice:  Diet: Hold TF Pain/Anxiety/Delirium protocol (if indicated): n/a VAP protocol (if indicated): n/a DVT prophylaxis: lovenox  GI prophylaxis: n/a  Glucose control: SSI  Mobility: as tolerated  Code Status: Full Code Family Communication: Ex-wife updated on patients status 4/10  Disposition: ICU  Labs   CBC: Recent Labs  Lab 03/18/19 0513 03/19/19 0500 03/21/19 0500 03/22/19 0500  WBC 3.9* 4.8 6.2 5.4  HGB 10.8* 11.3* 12.1* 11.6*  HCT 35.4* 37.7* 41.1 38.5*  MCV 88.5 89.8 90.1 89.5  PLT 299 306 324 301    Basic Metabolic Panel: Recent Labs  Lab 03/17/19 1643 03/18/19 0513 03/18/19 1712 03/19/19 0500 03/20/19 0618 03/21/19 0500 03/22/19 0500  NA  --  146*  --  146* 146* 148* 147*  K  --  4.0  --  4.0 3.8 4.0 4.4  CL  --  114*  --  112* 106 106 108   CO2  --  24  --  28 30 30 28   GLUCOSE  --  194*  --  240* 282* 337* 320*  BUN  --  28*  --  34* 39* 48* 50*  CREATININE  --  1.05  --  1.01 1.02 1.03 1.16  CALCIUM  --  7.9*  --  8.3* 8.8* 9.4 8.8*  MG 2.1 2.2 2.1  --   --   --   --   PHOS 3.5 3.2 3.6  --   --   --   --    GFR: Estimated Creatinine Clearance: 64.2 mL/min (by C-G formula based on SCr of 1.16 mg/dL). Recent Labs  Lab 03/18/19 0513 03/19/19 0500 03/21/19 0500 03/22/19 0500  WBC 3.9* 4.8 6.2 5.4    Liver Function Tests: No results for input(s): AST, ALT, ALKPHOS, BILITOT, PROT, ALBUMIN in the last 168 hours. No results for input(s): LIPASE, AMYLASE in the last 168 hours. No results for input(s): AMMONIA in the last 168 hours.  ABG    Component Value Date/Time   PHART 7.424 03/20/2019 1000   PCO2ART 48.5 (H) 03/20/2019 1000   PO2ART 99.2 03/20/2019 1000   HCO3 30.9 (H) 03/20/2019 1000   TCO2 22 04/29/2018 1755   ACIDBASEDEF 0.2 03/16/2019 2015   O2SAT 96.8 03/20/2019 1000     Coagulation Profile: No results for input(s): INR, PROTIME in the last 168 hours.  Cardiac Enzymes: No results for input(s): CKTOTAL, CKMB, CKMBINDEX, TROPONINI in the last 168 hours.  HbA1C: Hgb A1c MFr Bld  Date/Time Value Ref Range Status  03/11/2019 03:38 PM 12.4 (H) 4.8 - 5.6 % Final    Comment:    (NOTE) Pre diabetes:          5.7%-6.4% Diabetes:              >6.4% Glycemic control for   <7.0% adults with diabetes   04/30/2018 01:28 AM 11.5 (H) 4.8 - 5.6 % Final    Comment:    (NOTE) Pre diabetes:          5.7%-6.4% Diabetes:              >6.4% Glycemic control for   <7.0% adults with diabetes     CBG: Recent Labs  Lab 03/21/19 1521 03/21/19 2000 03/22/19 0104 03/22/19 0535 03/22/19 0744  GLUCAP 327* 294* 321* 302* 255*    Critical care time: 45 minutes    Mechele CollinJane Ellison, M.D. [facility]/Critical Care Medicine Pager: [phone] After hours pager: [phone]

## 2019-03-22 NOTE — Progress Notes (Signed)
Vent alarming, peak pressures overnight. Increased sedation with better compliance. Able to back off sedation this AM.

## 2019-03-23 ENCOUNTER — Inpatient Hospital Stay (HOSPITAL_COMMUNITY): Payer: 59

## 2019-03-23 DIAGNOSIS — L8990 Pressure ulcer of unspecified site, unspecified stage: Secondary | ICD-10-CM

## 2019-03-23 LAB — GLUCOSE, CAPILLARY
Glucose-Capillary: 192 mg/dL — ABNORMAL HIGH (ref 70–99)
Glucose-Capillary: 258 mg/dL — ABNORMAL HIGH (ref 70–99)
Glucose-Capillary: 281 mg/dL — ABNORMAL HIGH (ref 70–99)
Glucose-Capillary: 285 mg/dL — ABNORMAL HIGH (ref 70–99)
Glucose-Capillary: 308 mg/dL — ABNORMAL HIGH (ref 70–99)
Glucose-Capillary: 325 mg/dL — ABNORMAL HIGH (ref 70–99)

## 2019-03-23 MED ORDER — IPRATROPIUM-ALBUTEROL 0.5-2.5 (3) MG/3ML IN SOLN
3.0000 mL | Freq: Once | RESPIRATORY_TRACT | Status: AC
  Administered 2019-03-23: 09:00:00 3 mL via RESPIRATORY_TRACT

## 2019-03-23 MED ORDER — OXYCODONE HCL 5 MG PO TABS: 5.0000 mg | ORAL_TABLET | Freq: Four times a day (QID) | ORAL | Status: DC | PRN

## 2019-03-23 MED ORDER — CLEVIDIPINE BUTYRATE 0.5 MG/ML IV EMUL
0.0000 mg/h | INTRAVENOUS | Status: DC
  Administered 2019-03-23: 1 mg/h via INTRAVENOUS
  Administered 2019-03-23: 22:00:00 6 mg/h via INTRAVENOUS
  Administered 2019-03-23: 17:00:00 3 mg/h via INTRAVENOUS
  Administered 2019-03-24: 6 mg/h via INTRAVENOUS
  Filled 2019-03-23 (×5): qty 50

## 2019-03-23 MED ORDER — IPRATROPIUM BROMIDE HFA 17 MCG/ACT IN AERS
2.0000 | INHALATION_SPRAY | Freq: Four times a day (QID) | RESPIRATORY_TRACT | Status: DC
  Administered 2019-03-23 – 2019-03-24 (×2): 2 via RESPIRATORY_TRACT
  Filled 2019-03-23: qty 12.9

## 2019-03-23 NOTE — Progress Notes (Signed)
NAME:  Benjamin Cohen, MRN:  [id number], DOB:  1956/10/18, LOS: 12 ADMISSION DATE:  03/11/2019, CONSULTATION DATE:  03/12/19 REFERRING MD:  Dr. Rito Ehrlich, CHIEF COMPLAINT:  SOB   Brief History   63 y/o M admitted 3/31 with reports of fever, cough, SOB and confusion.  He was recently at a wedding mid March and after multiple family members were diagnosed with COVID including his wife.  Developed worsening hypoxemia, respiratory distress 4/1 and PCCM consulted for evaluation.  Intubated 4/2 with ARDS.   Past Medical History  HTN CVA - 2014, 04/2018 HTN  Significant Hospital Events   3/31 Admit, 2L, bilateral patchy infiltrates  4/01 PCCM consulted 4/02 Intubated 4/05 Net positive, remains on paralytic, still prone 4/06 Supine, plateau pressure 23, 50% / 12 PEEP 4/08 Plateau 23, PEEP 12 / 40%  Consults:  PCCM  Procedures:  ETT 4/2 >>  LUE PICC 4/2 >>  L Radial Aline 4/2 >> out   Significant Diagnostic Tests:  LDH 3/31 360  Micro Data:  BCx2 3/31 >> granulicatella adiacens 1/2  COVID 3/31 >> positive  RVP 4/1 >> negative Trach asp 4/2 >> COVID trach asp 4/2 >> Positive  Quantiferon Gold 4/6 >>   Antimicrobials:  Azithro 3/31 >> 4/4 Rocephin 3/31 >> 4/4  Interim history/subjective:  Tolerated SBT. Receiving duoneb via vent. Plan to extubate this morning  Objective   Blood pressure (!) 186/106, pulse 84, temperature (!) 97.3 F (36.3 C), resp. rate 20, height 5\' 7"  (1.702 m), weight 79.3 kg, SpO2 98 %.    Vent Mode: PRVC FiO2 (%):  [35 %] 35 % Set Rate:  [30 bmp] 30 bmp Vt Set:  [400 mL] 400 mL PEEP:  [8 cmH20] 8 cmH20 Pressure Support:  [10 cmH20] 10 cmH20 Plateau Pressure:  [22 cmH20-25 cmH20] 25 cmH20   Intake/Output Summary (Last 24 hours) at 03/23/2019 0944 Last data filed at 03/23/2019 0454 Gross per 24 hour  Intake 933.42 ml  Output 2426 ml  Net -1492.58 ml   Filed Weights   03/20/19 0500 03/22/19 0500 03/23/19 0450  Weight: 83.8 kg 79.6 kg 79.3 kg    Physical Exam: General: Ill-appearing, no acute distress HENT: [place], AT, ETT in place Eyes: EOMI, no scleral icterus Respiratory: Clear to auscultation bilaterally.  No crackles, wheezing or rales Cardiovascular: RRR, -M/R/G, no JVD GI: soft, nontender Extremities:-Edema,-tenderness Neuro: Awake, follows commands GU: Foley in place  Resolved Hospital Problem list   Hypotension Respiratory Alkalosis   Assessment & Plan:   ARDS secondary to COVID-19 -s/p toclizamab x 1 on 4/3 -s/p plaquenil x 5d -s/p ceftriaxone and azithro x 5d P: Extubate to face mask and HFNC Wean supplemental oxygen for goal SpO2 88-95% Diuresis Incentive spirometry Atrovent q6h to minimize tachycardia. Use spacer  Hypertension P: PRN hydralazine   DM II  Hyperglycemia  P: SSI  Lantus to 10 untis QD Hold home agents   Hx CVA, HLD P: Supportive care / monitor   Best practice:  Diet: NPO Pain/Anxiety/Delirium protocol (if indicated):-- VAP protocol (if indicated): -- DVT prophylaxis: lovenox  GI prophylaxis: n/a  Glucose control: SSI  Mobility: as tolerated  Code Status: Full Code Family Communication: Bronson Ing, ex-wife, updated on 4/12 Disposition: ICU  Labs   CBC: Recent Labs  Lab 03/18/19 0513 03/19/19 0500 03/21/19 0500 03/22/19 0500  WBC 3.9* 4.8 6.2 5.4  HGB 10.8* 11.3* 12.1* 11.6*  HCT 35.4* 37.7* 41.1 38.5*  MCV 88.5 89.8 90.1 89.5  PLT 299 306 324 301  Basic Metabolic Panel: Recent Labs  Lab 03/17/19 1643 03/18/19 0513 03/18/19 1712 03/19/19 0500 03/20/19 0618 03/21/19 0500 03/22/19 0500  NA  --  146*  --  146* 146* 148* 147*  K  --  4.0  --  4.0 3.8 4.0 4.4  CL  --  114*  --  112* 106 106 108  CO2  --  24  --  28 30 30 28   GLUCOSE  --  194*  --  240* 282* 337* 320*  BUN  --  28*  --  34* 39* 48* 50*  CREATININE  --  1.05  --  1.01 1.02 1.03 1.16  CALCIUM  --  7.9*  --  8.3* 8.8* 9.4 8.8*  MG 2.1 2.2 2.1  --   --   --   --   PHOS 3.5 3.2 3.6  --   --    --   --    GFR: Estimated Creatinine Clearance: 59.4 mL/min (by C-G formula based on SCr of 1.16 mg/dL). Recent Labs  Lab 03/18/19 0513 03/19/19 0500 03/21/19 0500 03/22/19 0500  WBC 3.9* 4.8 6.2 5.4    Liver Function Tests: No results for input(s): AST, ALT, ALKPHOS, BILITOT, PROT, ALBUMIN in the last 168 hours. No results for input(s): LIPASE, AMYLASE in the last 168 hours. No results for input(s): AMMONIA in the last 168 hours.  ABG    Component Value Date/Time   PHART 7.424 03/20/2019 1000   PCO2ART 48.5 (H) 03/20/2019 1000   PO2ART 99.2 03/20/2019 1000   HCO3 30.9 (H) 03/20/2019 1000   TCO2 22 04/29/2018 1755   ACIDBASEDEF 0.2 03/16/2019 2015   O2SAT 96.8 03/20/2019 1000     Coagulation Profile: No results for input(s): INR, PROTIME in the last 168 hours.  Cardiac Enzymes: No results for input(s): CKTOTAL, CKMB, CKMBINDEX, TROPONINI in the last 168 hours.  HbA1C: Hgb A1c MFr Bld  Date/Time Value Ref Range Status  03/11/2019 03:38 PM 12.4 (H) 4.8 - 5.6 % Final    Comment:    (NOTE) Pre diabetes:          5.7%-6.4% Diabetes:              >6.4% Glycemic control for   <7.0% adults with diabetes   04/30/2018 01:28 AM 11.5 (H) 4.8 - 5.6 % Final    Comment:    (NOTE) Pre diabetes:          5.7%-6.4% Diabetes:              >6.4% Glycemic control for   <7.0% adults with diabetes     CBG: Recent Labs  Lab 03/22/19 1700 03/22/19 2029 03/23/19 0040 03/23/19 0443 03/23/19 0839  GLUCAP 267* 274* 325* 308* 285*    Critical care time: 60 minutes    Mechele CollinJane Marbeth Smedley, M.D. [facility] Pager: [phone] After hours pager: [phone]

## 2019-03-23 NOTE — Progress Notes (Signed)
eLink Physician-Brief Progress Note Patient Name: Benjamin Cohen DOB: 12-04-1956 MRN: [id number]   Date of Service  03/23/2019  HPI/Events of Note  Sign out to check on resp status, extubated earlier  eICU Interventions  Resting. sats 95% on nasal o2. Continue care, watch for now.      Intervention Category Intermediate Interventions: Respiratory distress - evaluation and management  Ranee Gosselin 03/23/2019, 7:38 PM

## 2019-03-23 NOTE — Procedures (Signed)
Extubation Procedure Note  Patient Details:   Name: Benjamin Cohen DOB: March 22, 1956 MRN: [id number]   Airway Documentation:    Vent end date: 03/23/19 Vent end time: 0905   Evaluation  O2 sats: stable throughout Complications: No apparent complications Patient did tolerate procedure well. Bilateral Breath Sounds: Diminished Leak test preformed and successful. Pt placed on HFNC at 5 lpm, and 100% NRB mask. Sating at 100%. Yes  Veryl Speak 03/23/2019, 9:51 AM

## 2019-03-23 NOTE — Progress Notes (Signed)
SLP Cancellation Note  Patient Details Name: Benjamin Cohen MRN: [id number] DOB: May 08, 1956   Cancelled treatment:        Order received for swallowing assessment. Pt extubated 9:05 this morning. He was intubated for approximately 11 day. Talked with RN who reported pt is awake however has not attempted to speak and RN has not attempted po's. Will see pt if pt is appropriate for BSE tomorrow.    Royce Macadamia 03/23/2019, 12:51 PM  Breck Coons Lonell Face.Ed Nurse, children's [phone] Office [phone]

## 2019-03-24 LAB — GLUCOSE, CAPILLARY
Glucose-Capillary: 172 mg/dL — ABNORMAL HIGH (ref 70–99)
Glucose-Capillary: 177 mg/dL — ABNORMAL HIGH (ref 70–99)
Glucose-Capillary: 180 mg/dL — ABNORMAL HIGH (ref 70–99)
Glucose-Capillary: 180 mg/dL — ABNORMAL HIGH (ref 70–99)
Glucose-Capillary: 182 mg/dL — ABNORMAL HIGH (ref 70–99)
Glucose-Capillary: 192 mg/dL — ABNORMAL HIGH (ref 70–99)
Glucose-Capillary: 213 mg/dL — ABNORMAL HIGH (ref 70–99)

## 2019-03-24 LAB — TRIGLYCERIDES: Triglycerides: 301 mg/dL — ABNORMAL HIGH (ref <150)

## 2019-03-24 MED ORDER — LABETALOL HCL 5 MG/ML IV SOLN
10.0000 mg | INTRAVENOUS | Status: DC | PRN
  Administered 2019-03-24 – 2019-03-25 (×7): 10 mg via INTRAVENOUS
  Filled 2019-03-24 (×8): qty 4

## 2019-03-24 MED ORDER — ACETAMINOPHEN 325 MG PO TABS: 650.0000 mg | ORAL_TABLET | Freq: Four times a day (QID) | ORAL | Status: DC | PRN

## 2019-03-24 MED ORDER — HYDRALAZINE HCL 20 MG/ML IJ SOLN
10.0000 mg | INTRAMUSCULAR | Status: DC | PRN
  Administered 2019-03-24 – 2019-03-25 (×3): 10 mg via INTRAVENOUS
  Filled 2019-03-24 (×3): qty 1

## 2019-03-24 MED ORDER — LORAZEPAM 2 MG/ML IJ SOLN
1.0000 mg | INTRAMUSCULAR | Status: DC | PRN
  Administered 2019-03-24 – 2019-03-25 (×3): 1 mg via INTRAVENOUS
  Filled 2019-03-24 (×3): qty 1

## 2019-03-24 MED ORDER — METOPROLOL TARTRATE 5 MG/5ML IV SOLN
5.0000 mg | INTRAVENOUS | Status: DC | PRN
  Administered 2019-03-28 – 2019-03-29 (×2): 5 mg via INTRAVENOUS
  Filled 2019-03-24 (×2): qty 5

## 2019-03-24 MED ORDER — SODIUM CHLORIDE 0.9 % IV SOLN
INTRAVENOUS | Status: DC
  Administered 2019-03-24 (×2): via INTRAVENOUS

## 2019-03-24 MED ORDER — IPRATROPIUM BROMIDE HFA 17 MCG/ACT IN AERS
2.0000 | INHALATION_SPRAY | RESPIRATORY_TRACT | Status: DC | PRN
  Administered 2019-03-27 (×2): 2 via RESPIRATORY_TRACT
  Filled 2019-03-24: qty 12.9

## 2019-03-24 MED ORDER — OXYCODONE HCL 5 MG PO TABS: 5.0000 mg | ORAL_TABLET | Freq: Four times a day (QID) | ORAL | Status: DC | PRN

## 2019-03-24 NOTE — Progress Notes (Signed)
NAME:  Benjamin Cohen, MRN:  [id number], DOB:  November 27, 1956, LOS: 13 ADMISSION DATE:  03/11/2019, CONSULTATION DATE:  03/12/19 REFERRING MD:  Dr. Rito EhrlichKrishnan, CHIEF COMPLAINT:  SOB   Brief History   63 y/o M admitted 3/31 with reports of fever, cough, SOB and confusion.  He was recently at a wedding mid March and after multiple family members were diagnosed with COVID including his wife.  Developed worsening hypoxemia, respiratory distress 4/1 and PCCM consulted for evaluation.  Intubated 4/2 with ARDS.   Past Medical History  HTN, CVA - 2014 and 04/2018  Significant Hospital Events   3/31 Admit, 2L, bilateral patchy infiltrates  4/01 PCCM consulted 4/02 Intubated 4/03 toclizamab x 1 4/05 Net positive, remains on paralytic, still prone 4/06 Supine, plateau pressure 23, 50% / 12 PEEP; complete 5 days of plaquenil 4/08 Plateau 23, PEEP 12 / 40%  Consults:  PCCM  Procedures:  ETT 4/2 >> 4/12 LUE PICC 4/2 >>   Significant Diagnostic Tests:  LDH 3/31 360  Micro Data:  BCx2 3/31 >> granulicatella adiacens 1/2  COVID 3/31 >> positive  RVP 4/1 >> negative Trach asp 4/2 >> oral flora COVID trach asp 4/2 >> Positive  Quantiferon Gold 4/6 >> negative  Antimicrobials:  Azithro 3/31 >> 4/4 Rocephin 3/31 >> 4/4  Interim history/subjective:  Non verbal.  Follows commands.    Objective   Blood pressure 124/76, pulse 79, temperature (!) 97.5 F (36.4 C), resp. rate (!) 22, height 5\' 7"  (1.702 m), weight 84.8 kg, SpO2 96 %.        Intake/Output Summary (Last 24 hours) at 03/24/2019 0850 Last data filed at 03/24/2019 0418 Gross per 24 hour  Intake 263.76 ml  Output 2650 ml  Net -2386.24 ml   Filed Weights   03/22/19 0500 03/23/19 0450 03/24/19 0410  Weight: 79.6 kg 79.3 kg 84.8 kg   Physical Exam:  General - alert Eyes - pupils reactive ENT - no sinus tenderness, no stridor Cardiac - regular rate/rhythm, no murmur Chest - scattered rhonchi Abdomen - soft, non tender, + bowel  sounds Extremities - no cyanosis, clubbing, or edema Skin - no rashes Neuro - follows commands appropriately    Resolved Hospital Problem list   Hypotension, Respiratory Alkalosis, ARDS  Assessment & Plan:   Acute hypoxic respiratory failure from COVID 19 pneumonia. Plan: - oxygen to keep SpO2 > 92% - bronchial hygiene - f/u CXR intermittently - prn BDs  Hypertension. Hx of CVA, HLD. Plan: - wean off cleviprex to keep SBP < 160 - prn labetalol, hydralazine - lasix 40 mg x one on 4/13 - transition to oral medications when able to swallow - hold outpt lipitor, plavix until able to swallow pills  DM II. Plan - SSI with lantus 10 units daily  Acute metabolic encephalopathy. Plan - wean off precedex for RASS goal 0  Dysphagia. Plan - speech therapy  Deconditioning. Plan - PT/OT  Best practice:  Diet: NPO DVT prophylaxis: lovenox  GI prophylaxis: n/a  Mobility: as tolerated  Code Status: Full Code Family Communication: no family at bedside Disposition: ICU  Labs    CMP Latest Ref Rng & Units 03/22/2019 03/21/2019 03/20/2019  Glucose 70 - 99 mg/dL 409(W320(H) 119(J337(H) 478(G282(H)  BUN 8 - 23 mg/dL 95(A50(H) 21(H48(H) 08(M39(H)  Creatinine 0.61 - 1.24 mg/dL 5.781.16 4.691.03 6.291.02  Sodium 135 - 145 mmol/L 147(H) 148(H) 146(H)  Potassium 3.5 - 5.1 mmol/L 4.4 4.0 3.8  Chloride 98 - 111 mmol/L 108 106  106  CO2 22 - 32 mmol/L 28 30 30   Calcium 8.9 - 10.3 mg/dL 0.0(F) 9.4 7.4(B)  Total Protein 6.5 - 8.1 g/dL - - -  Total Bilirubin 0.3 - 1.2 mg/dL - - -  Alkaline Phos 38 - 126 U/L - - -  AST 15 - 41 U/L - - -  ALT 0 - 44 U/L - - -   CBC Latest Ref Rng & Units 03/22/2019 03/21/2019 03/19/2019  WBC 4.0 - 10.5 K/uL 5.4 6.2 4.8  Hemoglobin 13.0 - 17.0 g/dL 11.6(L) 12.1(L) 11.3(L)  Hematocrit 39.0 - 52.0 % 38.5(L) 41.1 37.7(L)  Platelets 150 - 400 K/uL 301 324 306   CBG (last 3)  Recent Labs    03/24/19 0023 03/24/19 0402 03/24/19 0855  GLUCAP 213* 180* 172*    Coralyn Helling, MD [facility] 03/24/2019, 8:59 AM

## 2019-03-24 NOTE — Evaluation (Signed)
SLP Cancellation Note  Patient Details Name: Benjamin Cohen MRN: [id number] DOB: 12/23/1955   Cancelled treatment:       Reason Eval/Treat Not Completed: Other (comment);Pain limiting ability to participate(pt had a run of vtach, RN advised she would call SLP when pt is appropriate for swallow eval)   Chales Abrahams 03/24/2019, 10:47 AM   Donavan Burnet, MS [facility] S[facility] Acute Rehab Services Pager [phone] Office [phone]

## 2019-03-24 NOTE — Progress Notes (Signed)
Updated [name] over the phone and discussed treatment plan.  Coralyn Helling, MD [facility] Pulmonary/Critical Care 03/24/2019, 1:35 PM

## 2019-03-24 NOTE — Consult Note (Addendum)
WOC Nurse wound consult note Reason for Consult: Consult requested posterior neck wound.  Pt is on isolation for positive Covid-19, according to progress notes.   The WOC nurses are performing telehealth consults for patients who are in Covid-19 isolation. Discussed wound appearance with the bedside nurse via phone call.   She states the patient developed a partial thickness wound to their neck, possibly from the ETT securement device. According to the bedside nurse; wound is a stage 2 pressure injury;  approx 2X2X.2cm, pink and dry, and dressings are becoming adhered to the location. Dressing procedure/placement/frequency: Xeroform gauze to reduce discomfort with dressing changes and adherence to the wound bed, silicone foam dressing to reduce skin stripping and promote healing. Topical treatment orders provided for bedside nurses to perform daily. Please re-consult if further assistance is needed.  Thank-you,  Cammie Mcgee MSN, RN, CWOCN, [place], CNS [phone]

## 2019-03-24 NOTE — Progress Notes (Signed)
Nutrition Follow-up  RD working remotely.   DOCUMENTATION CODES:   Obesity unspecified  INTERVENTION:  - diet advancement as medically feasible. - will order appropriate/needed interventions with diet advancement.   NUTRITION DIAGNOSIS:   Inadequate oral intake related to inability to eat as evidenced by NPO status. -ongoing  GOAL:   Patient will meet greater than or equal to 90% of their needs -unable to meet  MONITOR:   Diet advancement, Labs, Weight trends, Skin  ASSESSMENT:   63 year-old male admitted 3/31 with reports of fever, cough, SOB and confusion.  He was recently at a wedding mid-March and after multiple family members were diagnosed with COVID-19. Patient had been found on the floor and unable to get for several days. He developed worsening hypoxemia, respiratory distress 4/1 and PCCM consulted for evaluation. CXR showed patchy bilateral infiltrates. He was screened for COVID in the ED on 3/31 and treated empirically for CAP.  Weight has fluctuated throughout admission. Estimated nutrition needs update based on extubation 4/12 at ~9:00 AM. Patient was previously receiving Vital High Protein @ 50 ml/hr with 60 ml prostat once/day. Diet remains NPO this AM. Noted new pressure injury documented as of 4/12.  Per Dr. Evlyn Courier note this AM: acute hypoxic respiratory failure d/t COVID-19, PNA, HTN with plan to wean off cleviprex, plan to transition to oral medications when able to swallow pills, dysphagia with SLP to evaluate.   Medications reviewed; 40 mg IV lasix/day, sliding scale novolog,  Labs reviewed; CBGs: 213, 180, and 172 mg/dl this AM and triglycerides: 301 mg/dl; from 0/16--WF: 093 mmol/l, BUN: 50 mg/dl. IVF; NS @ 50 ml/hr.  Drips; cleviprex @ 4 mg/hr (384 kcal from lipid), precedex @ 0.3 mcg/kg/hr.      NUTRITION - FOCUSED PHYSICAL EXAM:  remain unable to complete d/t COVID-19.  Diet Order:   Diet Order            Diet NPO time specified  Diet  effective now              EDUCATION NEEDS:   Not appropriate for education at this time  Skin:  Skin Assessment: Skin Integrity Issues: Skin Integrity Issues:: Stage II Stage II: R face (new 4/12)  Last BM:  4/12  Height:   Ht Readings from Last 1 Encounters:  03/19/19 5\' 7"  (1.702 m)    Weight:   Wt Readings from Last 1 Encounters:  03/24/19 84.8 kg    Ideal Body Weight:  67.27 kg  BMI:  Body mass index is 29.28 kg/m.  Estimated Nutritional Needs:   Kcal:  2290-2460 kcal  Protein:  100-115 grams  Fluid:  >/= 1.8 L/day     Trenton Gammon, MS, RD, LDN, [facility] Inpatient Clinical Dietitian Pager # [phone] After hours/weekend pager # [phone]

## 2019-03-24 NOTE — Progress Notes (Signed)
OT Cancellation Note  Patient Details Name: Benjamin Cohen MRN: [id number] DOB: 1956/10/03   Cancelled Treatment:    Reason Eval/Treat Not Completed: Patient not medically ready  Pain limiting ability to participate(pt had a run of vtach) per RN Lise Auer, OT Acute Rehabilitation Services [phone] Office- [phone]     Karlon Schlafer, Karin Golden D 03/24/2019, 10:49 AM

## 2019-03-24 NOTE — Evaluation (Signed)
Clinical/Bedside Swallow Evaluation Patient Details  Name: Benjamin Cohen MRN: [id number] Date of Birth: Nov 28, 1956  Today's Date: 03/24/2019 Time: SLP Start Time (ACUTE ONLY): 1450 SLP Stop Time (ACUTE ONLY): 1515 SLP Time Calculation (min) (ACUTE ONLY): 25 min  Past Medical History:  Past Medical History:  Diagnosis Date  . Hypertension   . Stroke [facility])    Past Surgical History:  Past Surgical History:  Procedure Laterality Date  . NO PAST SURGERIES     HPI:  63 yo male adm to [facility] with respiratory issues 03/11/2019 -  fever, confusion, cough, dyspnea - diagnosed with COVID 19. Pt required intubation 03/13/2019 to 03/22/2019 suffering from ARDS.  He also has h/o CVA 2014 and 04/2018, HTN.  BSE done 11/12/2013 left labial and lingual weakness with recommendation for regular/thin diet.  Prior imaging studies showed small chronic anterior bilateral basal ganglia cva, right cerebellar cva and right posterior lentiform nucleus.  He also has spondylosis from C3-T1 - worse C3-C4, C6-C7.  Pt recent CXR showed persistent lung disease both lungs but improved.  He is on HFNC 5%.  Swallow evaluation ordered.       Assessment / Plan / Recommendation Clinical Impression  This pt currently presents with s/s of gross oropharyngeal dysphagia presumed due to illness-related deconditioning, prolonged intubation and cognitive status.  He is aphonic with audible secretions heard at his larynx that he does not clear with cued throat clear or cough.  Suspect possible severe pharyngeal/laryngeal edema due to his intubation.  In addition, pt with significantly impaired attention, required moderate verbal/visual cues to remain on task for longer than 1 minute.  He looks away from this SLP and will only inconsistently follow directions.    Provided him with intake of 3 small single ice chips and 1/2 tsp amounts of water.  Pt did not "masticate" nor swallow any bolus provided in less time than 15 seconds.  Most swallow  triggers were from 23 seconds to over 38 seconds.  He winces with swallow and admits to throat pain with swallowing - however uncertain to his ability to understand fully.  Following all intake, his audible secretions near larynx worsened and cued cough not effective to clear.     At this time, anticipate Mr Rothermel may need at least several days for his swallowing to improve given severity of dysphagia.    Recommend NPO except 1/2 tsp of water after oral care (sitting upright and oral suction if he does not swallow).    Anticipate he may benefit from small bore feeding tube for nutrition at this time.  Educated him to findings/recommendations of improved secretion management, cough strength and ability to initiate a swallow to help determine readiness for transitioning into po diet.  Informed RN of concerns, recommendations. Will follow clinically.  Thanks for this consult.   SLP Visit Diagnosis: Dysphagia, oropharyngeal phase (R13.12)    Aspiration Risk  Severe aspiration risk;Risk for inadequate nutrition/hydration    Diet Recommendation NPO;Other (Comment);Alternative means - temporary(tsps of water by RN after oral care, suction if pt does not swallow)   Medication Administration: Via alternative means    Other  Recommendations Oral Care Recommendations: Oral care QID   Follow up Recommendations        Frequency and Duration min 1 x/week  1 week       Prognosis Prognosis for Safe Diet Advancement: Guarded Barriers to Reach Goals: Cognitive deficits;Time post onset;Severity of deficits      Swallow Study   General  Date of Onset: 03/24/19 HPI: 63 [facility] male [facility] respiratory issues 03/11/2019 -  fever, confusion, cough, dyspnea - diagnosed with COVID 19. Pt required intubation 03/13/2019 to 03/22/2019 suffering from ARDS.  He also has h/o CVA 2014 and 04/2018, HTN.  BSE done 11/12/2013 left labial and lingual weakness with recommendation for regular/thin diet.  Prior imaging  studies showed small chronic anterior bilateral basal ganglia cva, right cerebellar cva and right posterior lentiform nucleus.  He also has spondylosis from C3-T1 - worse C3-C4, C6-C7.  Pt recent CXR showed persistent lung disease both lungs but improved.  He is on HFNC 5%.  Swallow evaluation ordered.     Type of Study: Bedside Swallow Evaluation Previous Swallow Assessment: Clinical swallow eval 11/12/2013 Diet Prior to this Study: NPO Temperature Spikes Noted: No Respiratory Status: Other (comment)(3 HFNC) History of Recent Intubation: Yes Length of Intubations (days): 11 days Date extubated: 03/23/19 Behavior/Cognition: Alert;Distractible;Doesn't follow directions;Other (Comment);Impulsive(inconsistently follows directions) Oral Cavity Assessment: Dried secretions;Other (comment)(secretions on tongue - coated/tan colored) Oral Care Completed by SLP: Yes Oral Cavity - Dentition: Other (Comment);Missing dentition(some dentition present, lowers on few dentition) Self-Feeding Abilities: Total assist Patient Positioning: Upright in bed Baseline Vocal Quality: Aphonic;Not observed;Suspected CN X (Vagus) involvement(? if pt has pharyngeal/laryngeal edema from prolonged intubation) Volitional Cough: Weak Volitional Swallow: Unable to elicit    Oral/Motor/Sensory Function Overall Oral Motor/Sensory Function: Severe impairment(pt with gross weakness but also h/o left labial/lingual weakness) Facial Strength: Reduced left;Suspected CN VII (facial) dysfunction Lingual ROM: Suspected CN XII (hypoglossal) dysfunction Lingual Strength: Suspected CN XII (hypoglossal) dysfunction;Reduced Lingual Sensation: Reduced;Suspected CN VII (facial) dysfunction-anterior 2/3 tongue Velum: (appeared sluggish but bilateral) Mandible: Other (Comment)(dnt)   Ice Chips Ice chips: Impaired Presentation: Spoon Oral Phase Impairments: Reduced labial seal;Reduced lingual movement/coordination;Impaired mastication;Poor  awareness of bolus Oral Phase Functional Implications: Oral holding;Prolonged oral transit Pharyngeal Phase Impairments: Suspected delayed Swallow;Other (comments)(increased audible secretions at laryngeal area that did not clear with cued cough and pt did not elicit dry swallow on command despite dry spoon pressure and verbal/visual cue) Other Comments: pt did not masticate small ice chip bolus nor swallow for over 35 seconds despite cues - ? cognition   Thin Liquid Thin Liquid: Impaired Presentation: Spoon Oral Phase Impairments: Reduced labial seal;Reduced lingual movement/coordination Oral Phase Functional Implications: Oral holding;Left anterior spillage Pharyngeal  Phase Impairments: Suspected delayed Swallow;Other (comments)(increased audible secretions at laryngeal area that did not clear with cued cough and pt did not elicit dry swallow on command despite dry spoon pressure and verbal/visual cues) Other Comments: pt swallow elicitation was grossly delayed (up to 23 seconds) despite max verbal/visual cues and dry spoon to trigger initial swallow, ? cognitive component    Nectar Thick Nectar Thick Liquid: Not tested   Honey Thick Honey Thick Liquid: Not tested   Puree Puree: Not tested   Solid     Solid: Not tested      Chales AbrahamsKimball, Andria Head Ann 03/24/2019,4:16 PM  Donavan Burnetamara Jaquita Bessire, MS [facility] SLP Acute Rehab Services Pager [phone] Office [phone]

## 2019-03-24 NOTE — Progress Notes (Signed)
PT Cancellation Note  Patient Details Name: Benjamin Cohen MRN: [id number] DOB: [birth year]   Cancelled Treatment:    Reason Eval/Treat Not Completed: Medical issues which prohibited therapy; run of VT earlier, RR 26--defer at this time and continue efforts;   [facility] 03/24/2019, 12:37 PM

## 2019-03-25 ENCOUNTER — Inpatient Hospital Stay (HOSPITAL_COMMUNITY): Payer: 59

## 2019-03-25 LAB — GLUCOSE, CAPILLARY
Glucose-Capillary: 172 mg/dL — ABNORMAL HIGH (ref 70–99)
Glucose-Capillary: 202 mg/dL — ABNORMAL HIGH (ref 70–99)
Glucose-Capillary: 181 mg/dL — ABNORMAL HIGH (ref 70–99)
Glucose-Capillary: 216 mg/dL — ABNORMAL HIGH (ref 70–99)
Glucose-Capillary: 168 mg/dL — ABNORMAL HIGH (ref 70–99)

## 2019-03-25 LAB — BASIC METABOLIC PANEL
Anion gap: 9 (ref 5–15)
BUN: 43 mg/dL — ABNORMAL HIGH (ref 8–23)
CO2: 26 mmol/L (ref 22–32)
Calcium: 9.3 mg/dL (ref 8.9–10.3)
Chloride: 119 mmol/L — ABNORMAL HIGH (ref 98–111)
Creatinine, Ser: 1.04 mg/dL (ref 0.61–1.24)
GFR calc Af Amer: >60 mL/min (ref >60)
GFR calc non Af Amer: >60 mL/min (ref >60)
Glucose, Bld: 197 mg/dL — ABNORMAL HIGH (ref 70–99)
Potassium: 3.4 mmol/L — ABNORMAL LOW (ref 3.5–5.1)
Sodium: 154 mmol/L — ABNORMAL HIGH (ref 135–145)

## 2019-03-25 LAB — CBC
HCT: 47.4 % (ref 39.0–52.0)
Hemoglobin: 13.6 g/dL (ref 13.0–17.0)
MCH: 26.2 pg (ref 26.0–34.0)
MCHC: 28.7 g/dL — ABNORMAL LOW (ref 30.0–36.0)
MCV: 91.2 fL (ref 80.0–100.0)
Platelets: 284 10*3/uL (ref 150–400)
RBC: 5.2 MIL/uL (ref 4.22–5.81)
RDW: 14 % (ref 11.5–15.5)
WBC: 7.1 10*3/uL (ref 4.0–10.5)
nRBC: 0 % (ref 0.0–0.2)

## 2019-03-25 LAB — MAGNESIUM: Magnesium: 2.9 mg/dL — ABNORMAL HIGH (ref 1.7–2.4)

## 2019-03-25 MED ORDER — OXYCODONE HCL 5 MG/5ML PO SOLN: 5.0000 mg | Freq: Four times a day (QID) | ORAL | Status: DC | PRN

## 2019-03-25 MED ORDER — OSMOLITE 1.5 CAL PO LIQD
1000.0000 mL | ORAL | Status: DC
  Administered 2019-03-26 – 2019-03-31 (×4): 1000 mL
  Filled 2019-03-25 (×12): qty 1000

## 2019-03-25 MED ORDER — HYDRALAZINE HCL 20 MG/ML IJ SOLN
10.0000 mg | INTRAMUSCULAR | Status: DC | PRN
  Administered 2019-03-25: 10 mg via INTRAVENOUS
  Filled 2019-03-25: qty 1

## 2019-03-25 MED ORDER — ACETAMINOPHEN 650 MG RE SUPP: 650.0000 mg | Freq: Four times a day (QID) | RECTAL | Status: DC | PRN

## 2019-03-25 MED ORDER — JEVITY 1.2 CAL PO LIQD: 1000.0000 mL | ORAL | Status: DC

## 2019-03-25 MED ORDER — ACETAMINOPHEN 650 MG RE SUPP: 650.0000 mg | RECTAL | Status: DC | PRN

## 2019-03-25 MED ORDER — ACETAMINOPHEN 160 MG/5ML PO SOLN
650.0000 mg | Freq: Four times a day (QID) | ORAL | Status: DC | PRN
  Administered 2019-03-30: 650 mg
  Filled 2019-03-25: qty 20.3

## 2019-03-25 MED ORDER — JUVEN PO PACK
1.0000 | PACK | Freq: Two times a day (BID) | ORAL | Status: DC
  Administered 2019-03-26 – 2019-04-01 (×8): 1
  Filled 2019-03-25 (×18): qty 1

## 2019-03-25 MED ORDER — KCL IN DEXTROSE-NACL 20-5-0.45 MEQ/L-%-% IV SOLN
INTRAVENOUS | Status: DC
  Administered 2019-03-25 – 2019-03-26 (×2): via INTRAVENOUS
  Filled 2019-03-25 (×2): qty 1000

## 2019-03-25 MED ORDER — POLYETHYLENE GLYCOL 3350 17 G PO PACK: 17.0000 g | PACK | Freq: Every day | ORAL | Status: DC | PRN

## 2019-03-25 MED ORDER — CLOPIDOGREL BISULFATE 75 MG PO TABS
75.0000 mg | ORAL_TABLET | Freq: Every day | ORAL | Status: DC
  Administered 2019-03-27 – 2019-03-31 (×5): 75 mg
  Filled 2019-03-25 (×9): qty 1

## 2019-03-25 MED ORDER — PRO-STAT SUGAR FREE PO LIQD
60.0000 mL | ORAL | Status: DC
  Administered 2019-03-26 – 2019-03-30 (×4): 60 mL
  Filled 2019-03-25 (×5): qty 60

## 2019-03-25 MED ORDER — OSMOLITE 1.5 CAL PO LIQD
1000.0000 mL | ORAL | Status: DC
  Filled 2019-03-25: qty 1000

## 2019-03-25 MED ORDER — AMLODIPINE BESYLATE 5 MG PO TABS
5.0000 mg | ORAL_TABLET | Freq: Every day | ORAL | Status: DC
  Administered 2019-03-27 – 2019-03-31 (×5): 5 mg
  Filled 2019-03-25 (×7): qty 1

## 2019-03-25 MED ORDER — POTASSIUM CHLORIDE 10 MEQ/50ML IV SOLN
10.0000 meq | INTRAVENOUS | Status: AC
  Administered 2019-03-25 (×2): 10 meq via INTRAVENOUS
  Filled 2019-03-25 (×2): qty 50

## 2019-03-25 MED ORDER — ACETAMINOPHEN 325 MG PO TABS: 650.0000 mg | ORAL_TABLET | Freq: Four times a day (QID) | ORAL | Status: DC | PRN

## 2019-03-25 MED ORDER — ATORVASTATIN CALCIUM 40 MG PO TABS
40.0000 mg | ORAL_TABLET | Freq: Every day | ORAL | Status: DC
  Administered 2019-03-26 – 2019-04-01 (×5): 40 mg
  Filled 2019-03-25 (×6): qty 1

## 2019-03-25 MED ORDER — ATORVASTATIN CALCIUM 40 MG PO TABS: 40.0000 mg | ORAL_TABLET | Freq: Every day | ORAL | Status: DC

## 2019-03-25 MED ORDER — CLOPIDOGREL BISULFATE 75 MG PO TABS: 75.0000 mg | ORAL_TABLET | Freq: Every day | ORAL | Status: DC

## 2019-03-25 NOTE — Progress Notes (Signed)
PT Cancellation Note  Patient Details Name: OLGA NORSWORTHY MRN: [id number] DOB: 11-19-1956   Cancelled Treatment:    Reason Eval/Treat Not Completed: Other (comment)RN has been  Working with patient for some time. Will check back another time.   Rada Hay 03/25/2019, 11:23 AM Blanchard Kelch PT Acute Rehabilitation Services Pager [phone] Office [phone]

## 2019-03-25 NOTE — Progress Notes (Addendum)
Upon entering patients room the NGT appears to have been pulled back 15cm.   RN consulted Charge and directed to advance and repeat xray for confirmation of placement.  RN advanced NGT and secured.  Xray order placed to confirm placement.  Receiving nurse Angelica Chessman updated that patient will need a repeat abd. xry and that guide wire still present RN directed to contact ICU upon results of xry so the wire can be removed.

## 2019-03-25 NOTE — Progress Notes (Signed)
[facility] ADULT ICU REPLACEMENT PROTOCOL FOR AM LAB REPLACEMENT ONLY  The patient does apply for the [facility] Adult ICU Electrolyte Replacment Protocol based on the criteria listed below:   1. Is GFR >/= 40 ml/min? Yes.    Patient's GFR today is >60 2. Is urine output >/= 0.5 ml/kg/hr for the last 6 hours? Yes.   Patient's UOP is .6 ml/kg/hr 3. Is BUN < 60 mg/dL? Yes.    Patient's BUN today is 43 4. Abnormal electrolyte(s):3.4 5. Ordered repletion with: per protocol 6. If a panic level lab has been reported, has the CCM MD in charge been notified? Yes.  .   Physician:  Rolena Infante  MD  Melrose Nakayama 03/25/2019 6:02 AM

## 2019-03-25 NOTE — Progress Notes (Signed)
eLink Physician-Brief Progress Note Patient Name: Benjamin Cohen DOB: 12-May-1956 MRN: [id number]   Date of Service  03/25/2019  HPI/Events of Note  SBP again > 160. Asking for increase in frequency of med.   eICU Interventions  Hydralazine frquency increased from q4 to q2 hrs prn. On labetalol prn q2 hrs alo. If uncontrolled consider cardene gtt.      Intervention Category Intermediate Interventions: Hypertension - evaluation and management  Ranee Gosselin 03/25/2019, 5:33 AM

## 2019-03-25 NOTE — Progress Notes (Signed)
RN contacted Radiologist Dr. Mayford Knife to review abd. xry results to see if possible to start utilizing NGT.  Unfortunately after three tries the tube is not progressing into the appropriate area.  RN directed by Dr. Mayford Knife that the NGT may need to be placed by IR.  RN updated Dr. Craige Cotta and contacted current RN Angelica Chessman and directed her to remove NGT at this time.

## 2019-03-25 NOTE — Progress Notes (Signed)
Paged Elink regarding MEWS score/sepsis flag.

## 2019-03-25 NOTE — Progress Notes (Signed)
OT Cancellation Note  Patient Details Name: Benjamin Cohen MRN: [id number] DOB: 01-Feb-1956   Cancelled Treatment:    Reason Eval/Treat Not Completed: Other (comment)   RN has been  Working with patient for some time. Will check back another time.  Lise Auer, OT Acute Rehabilitation Services [phone] Office- [phone]    Lonzell Dorris, Karin Golden D 03/25/2019, 11:37 AM

## 2019-03-25 NOTE — Progress Notes (Signed)
MD notified of MEWS score.

## 2019-03-25 NOTE — Progress Notes (Signed)
SLP Cancellation Note  Patient Details Name: Benjamin Cohen MRN: [id number] DOB: October 07, 1956   Cancelled treatment:       Reason Eval/Treat Not Completed: Other (comment)(pt just finished with xray, feeding tube in place with guidewire; SlP to continue efforts)   Chales Abrahams 03/25/2019, 2:14 PM  Donavan Burnet, MS [facility] SLP Acute Rehab Services Pager [phone] Office [phone]

## 2019-03-25 NOTE — Progress Notes (Addendum)
Nutrition Follow-up  RD working remotely.   DOCUMENTATION CODES:   Obesity unspecified  INTERVENTION:  - will order TF: 60 ml prostat once/day and Juven BID with Osmolite 1.5 @ 25 ml/hr to advance by 10 ml every 8 hours to reach goal rate of 55 ml/hr. - at goal rate, this regimen will provide 2340 kcal, 113 grams protein, 28 grams amino acids, and 1006 ml free water. - free water flush per MD given current hypernatremia.  - recommend 250 mg ascorbic acid and 220 mg zinc sulfate per NGT/day x10-14 days.    NUTRITION DIAGNOSIS:   Inadequate oral intake related to inability to eat as evidenced by NPO status. -ongoing  GOAL:   Patient will meet greater than or equal to 90% of their needs -unmet  MONITOR:   TF tolerance, Labs, Weight trends, Skin  REASON FOR ASSESSMENT:   Consult Enteral/tube feeding initiation and management  ASSESSMENT:   63 year-old male admitted 3/31 with reports of fever, cough, SOB and confusion.  He was recently at a wedding mid-March and after multiple family members were diagnosed with COVID-19. Patient had been found on the floor and unable to get for several days. He developed worsening hypoxemia, respiratory distress 4/1 and PCCM consulted for evaluation. CXR showed patchy bilateral infiltrates. He was screened for COVID in the ED on 3/31 and treated empirically for CAP.  Significant Events:  3/31- admission 4/2- intubated; OGT placed; found to be COVID-19 positive 4/5- proned 4/6- supine; TF initiation 4/12- extubated 4/13- SLP performed bedside swallow evaluation and           recommended NPO 4/14- NGT placed by RN    Weight stable from yesterday. SLP recommended NPO and NGT was placed by RN ~2 hours ago. Will order TF as outlined above. He had been receiving Vital High Protein @ 50 ml/hr with 60 ml prostat once/day while intubated.   Per Dr. Evlyn Courier note today: plan to transfer to the floor today and for hospitalist to take over care with  PCCM to sign off 4/15.    Medications reviewed; sliding scale novolog, 10 units lantus/day, 10 mEq IV KCl x2 runs 4/14.  Labs reviewed; CBGs: 172, 202, and 181 mg/dl today, Na: 585 mmol/l, K: 3.4 mmol/l, Cl: 119 mmol/l, BUN: 43 mg/dl, Mg: 2,9 mg/dl.  IVF; D5-1/2 NS-20 mEq IV KCl @ 50 ml/hr (204 kcal).    Diet Order:   Diet Order            Diet NPO time specified  Diet effective now              EDUCATION NEEDS:   Not appropriate for education at this time  Skin:  Skin Assessment: Skin Integrity Issues: Skin Integrity Issues:: Stage II Stage II: R face (new 4/12)  Last BM:  4/12  Height:   Ht Readings from Last 1 Encounters:  03/19/19 5\' 7"  (1.702 m)    Weight:   Wt Readings from Last 1 Encounters:  03/25/19 84.1 kg    Ideal Body Weight:  67.27 kg  BMI:  Body mass index is 29.04 kg/m.  Estimated Nutritional Needs:   Kcal:  2300-2500 kcal  Protein:  100-125 grams (1.2-1.5 grams/kg)  Fluid:  >/= 2.2 L/day     Trenton Gammon, MS, RD, LDN, [facility] Inpatient Clinical Dietitian Pager # [phone] After hours/weekend pager # [phone]

## 2019-03-25 NOTE — Progress Notes (Signed)
Report given to receiving nurse Angelica Chessman, all questions answered at this time.  Pt. VSS with no s/s of distress noted.  Patient stable at transfer.

## 2019-03-25 NOTE — Progress Notes (Signed)
MEWS 3, BP 141/101, HR 110, RR 26. Paged Donnamarie Poag.

## 2019-03-25 NOTE — Progress Notes (Signed)
eLink Physician-Brief Progress Note Patient Name: ANDRA MOTTON DOB: 1956/02/27 MRN: [id number]   Date of Service  03/25/2019  HPI/Events of Note  SBP 150's. On hydralazine and labetalol prn. HR ok. Not in pain. On ativan prn also.   eICU Interventions  - continue care.  Watch for now. Not on anti HTN meds at home. If consistently high over 160 to call back. Discussed with bed side RN.      Intervention Category Intermediate Interventions: Hypertension - evaluation and management  Ranee Gosselin 03/25/2019, 1:15 AM

## 2019-03-25 NOTE — Progress Notes (Addendum)
NAME:  Benjamin Cohen, MRN:  [id number], DOB:  December 16, 1955, LOS: 14 ADMISSION DATE:  03/11/2019, CONSULTATION DATE:  03/12/19 REFERRING MD:  Dr. Rito EhrlichKrishnan, CHIEF COMPLAINT:  SOB   Brief History   63 y/o M admitted 3/31 with reports of fever, cough, SOB and confusion.  He was recently at a wedding mid March and after multiple family members were diagnosed with COVID including his wife.  Developed worsening hypoxemia, respiratory distress 4/1 and PCCM consulted for evaluation.  Intubated 4/2 with ARDS.   Past Medical History  HTN, CVA - 2014 and 04/2018  Significant Hospital Events   3/31 Admit, 2L, bilateral patchy infiltrates  4/01 PCCM consulted 4/02 Intubated 4/03 toclizamab x 1 4/05 Net positive, remains on paralytic, still prone 4/06 Supine, plateau pressure 23, 50% / 12 PEEP; complete 5 days of plaquenil 4/08 Plateau 23, PEEP 12 / 40% 4/14 transfer to floor bed  Consults:    Procedures:  ETT 4/2 >> 4/12 LUE PICC 4/2 >>   Significant Diagnostic Tests:  LDH 3/31 360  Micro Data:  BCx2 3/31 >> granulicatella adiacens 1/2  COVID 3/31 >> positive  RVP 4/1 >> negative Trach asp 4/2 >> oral flora COVID trach asp 4/2 >> Positive  Quantiferon Gold 4/6 >> negative  Antimicrobials:  Azithro 3/31 >> 4/4 Rocephin 3/31 >> 4/4  Interim history/subjective:  Follows commands.  Non verbal.    Objective   Blood pressure (!) 144/97, pulse 90, temperature (!) 97.5 F (36.4 C), temperature source Oral, resp. rate (!) 21, height 5\' 7"  (1.702 m), weight 84.1 kg, SpO2 91 %.        Intake/Output Summary (Last 24 hours) at 03/25/2019 1124 Last data filed at 03/25/2019 1034 Gross per 24 hour  Intake 948.35 ml  Output 575 ml  Net 373.35 ml   Filed Weights   03/23/19 0450 03/24/19 0410 03/25/19 0426  Weight: 79.3 kg 84.8 kg 84.1 kg   Physical Exam:  General - alert Eyes - pupils reactive ENT - no sinus tenderness, no stridor Cardiac - regular rate/rhythm, no murmur Chest - equal  breath sounds b/l, no wheezing or rales Abdomen - soft, non tender, + bowel sounds Extremities - no cyanosis, clubbing, or edema Skin - no rashes Neuro - follows commands  Resolved Hospital Problem list   Hypotension, Respiratory Alkalosis, ARDS, Acute metabolic encephalopathy, Acute hypoxic respiratory failure, COVID PNA  Assessment & Plan:   Hypertension. Hx of CVA, HLD. Plan: - add oral meds after getting cortrak - prn labetalol, hydralazine - resume lipitor, plavix after getting cortrak  DM II. Plan - SSI with lantus 10 units daily  Hypernatremia from diuresis. Plan - hold lasix - IV fluids D5 1/2 with 20 meq KCL at 50 ml/hr - f/u BMET  Dysphagia. Plan - f/u with speech therapy  Deconditioning. Plan - PT/OT  Best practice:  Diet: NPO DVT prophylaxis: lovenox  GI prophylaxis: n/a  Mobility: as tolerated  Code Status: Full Code Disposition: transfer to tele bed 4/14.  To [facility] 4/15 and PCCM off.  Labs    CMP Latest Ref Rng & Units 03/25/2019 03/22/2019 03/21/2019  Glucose 70 - 99 mg/dL 409(W197(H) 119(J320(H) 478(G337(H)  BUN 8 - 23 mg/dL 95(A43(H) 21(H50(H) 08(M48(H)  Creatinine 0.61 - 1.24 mg/dL 5.781.04 4.691.16 6.291.03  Sodium 135 - 145 mmol/L 154(H) 147(H) 148(H)  Potassium 3.5 - 5.1 mmol/L 3.4(L) 4.4 4.0  Chloride 98 - 111 mmol/L 119(H) 108 106  CO2 22 - 32 mmol/L 26 28 30  Calcium 8.9 - 10.3 mg/dL 9.3 8.0(S) 9.4  Total Protein 6.5 - 8.1 g/dL - - -  Total Bilirubin 0.3 - 1.2 mg/dL - - -  Alkaline Phos 38 - 126 U/L - - -  AST 15 - 41 U/L - - -  ALT 0 - 44 U/L - - -   CBC Latest Ref Rng & Units 03/25/2019 03/22/2019 03/21/2019  WBC 4.0 - 10.5 K/uL 7.1 5.4 6.2  Hemoglobin 13.0 - 17.0 g/dL 81.1 11.6(L) 12.1(L)  Hematocrit 39.0 - 52.0 % 47.4 38.5(L) 41.1  Platelets 150 - 400 K/uL 284 301 324   CBG (last 3)  Recent Labs    03/24/19 2302 03/25/19 0414 03/25/19 0825  GLUCAP 177* 172* 202*    Coralyn Helling, MD [facility] 03/25/2019, 11:24 AM

## 2019-03-26 ENCOUNTER — Inpatient Hospital Stay (HOSPITAL_COMMUNITY): Payer: 59

## 2019-03-26 LAB — COMPREHENSIVE METABOLIC PANEL
ALT: 38 U/L (ref 0–44)
AST: 26 U/L (ref 15–41)
Albumin: 3.5 g/dL (ref 3.5–5.0)
Alkaline Phosphatase: 99 U/L (ref 38–126)
Anion gap: 8 (ref 5–15)
BUN: 42 mg/dL — ABNORMAL HIGH (ref 8–23)
CO2: 25 mmol/L (ref 22–32)
Calcium: 9.3 mg/dL (ref 8.9–10.3)
Chloride: 123 mmol/L — ABNORMAL HIGH (ref 98–111)
Creatinine, Ser: 1.17 mg/dL (ref 0.61–1.24)
GFR calc Af Amer: >60 mL/min (ref >60)
GFR calc non Af Amer: >60 mL/min (ref >60)
Glucose, Bld: 189 mg/dL — ABNORMAL HIGH (ref 70–99)
Potassium: 3.2 mmol/L — ABNORMAL LOW (ref 3.5–5.1)
Sodium: 156 mmol/L — ABNORMAL HIGH (ref 135–145)
Total Bilirubin: 1.4 mg/dL — ABNORMAL HIGH (ref 0.3–1.2)
Total Protein: 7.4 g/dL (ref 6.5–8.1)

## 2019-03-26 LAB — GLUCOSE, CAPILLARY
Glucose-Capillary: 142 mg/dL — ABNORMAL HIGH (ref 70–99)
Glucose-Capillary: 173 mg/dL — ABNORMAL HIGH (ref 70–99)
Glucose-Capillary: 176 mg/dL — ABNORMAL HIGH (ref 70–99)
Glucose-Capillary: 182 mg/dL — ABNORMAL HIGH (ref 70–99)
Glucose-Capillary: 195 mg/dL — ABNORMAL HIGH (ref 70–99)
Glucose-Capillary: 207 mg/dL — ABNORMAL HIGH (ref 70–99)

## 2019-03-26 LAB — CBC
HCT: 46.7 % (ref 39.0–52.0)
Hemoglobin: 13.8 g/dL (ref 13.0–17.0)
MCH: 27 pg (ref 26.0–34.0)
MCHC: 29.6 g/dL — ABNORMAL LOW (ref 30.0–36.0)
MCV: 91.4 fL (ref 80.0–100.0)
Platelets: 258 K/uL (ref 150–400)
RBC: 5.11 MIL/uL (ref 4.22–5.81)
RDW: 14.3 % (ref 11.5–15.5)
WBC: 6.1 K/uL (ref 4.0–10.5)
nRBC: 0 % (ref 0.0–0.2)

## 2019-03-26 LAB — MAGNESIUM: Magnesium: 3 mg/dL — ABNORMAL HIGH (ref 1.7–2.4)

## 2019-03-26 LAB — PHOSPHORUS: Phosphorus: 4.7 mg/dL — ABNORMAL HIGH (ref 2.5–4.6)

## 2019-03-26 MED ORDER — INSULIN ASPART 100 UNIT/ML ~~LOC~~ SOLN
0.0000 [IU] | Freq: Three times a day (TID) | SUBCUTANEOUS | Status: DC
  Administered 2019-03-26 (×3): 3 [IU] via SUBCUTANEOUS
  Administered 2019-03-26: 5 [IU] via SUBCUTANEOUS
  Administered 2019-03-27 (×4): 8 [IU] via SUBCUTANEOUS
  Administered 2019-03-28: 15 [IU] via SUBCUTANEOUS

## 2019-03-26 MED ORDER — ALTEPLASE 2 MG IJ SOLR
2.0000 mg | Freq: Once | INTRAMUSCULAR | Status: AC
  Administered 2019-03-26: 05:00:00 2 mg
  Filled 2019-03-26: qty 2

## 2019-03-26 MED ORDER — INSULIN ASPART 100 UNIT/ML ~~LOC~~ SOLN: 0.0000 [IU] | Freq: Four times a day (QID) | SUBCUTANEOUS | Status: DC

## 2019-03-26 MED ORDER — POTASSIUM CHLORIDE 2 MEQ/ML IV SOLN
INTRAVENOUS | Status: DC
  Administered 2019-03-26 – 2019-03-28 (×4): via INTRAVENOUS
  Filled 2019-03-26 (×5): qty 1000

## 2019-03-26 MED ORDER — ALTEPLASE 2 MG IJ SOLR
2.0000 mg | Freq: Once | INTRAMUSCULAR | Status: AC
  Administered 2019-03-26: 2 mg
  Filled 2019-03-26: qty 2

## 2019-03-26 NOTE — Progress Notes (Signed)
PT Cancellation Note  Patient Details Name: Benjamin Cohen MRN: [id number] DOB: Jul 05, 1956   Cancelled Treatment:    Reason Eval/Treat Not Completed: Medical issues which prohibited therapy   Rada Hay 03/26/2019, 12:28 PM Blanchard Kelch PT Acute Rehabilitation Services Pager [phone] Office [phone]

## 2019-03-26 NOTE — Progress Notes (Addendum)
   03/26/19 2020  Vitals  Temp 98 F (36.7 C)  Temp Source Oral  BP 117/77  MAP (mmHg) 90  BP Location Right Arm  BP Method Automatic  Patient Position (if appropriate) Lying  Pulse Rate (!) 115  Pulse Rate Source Dinamap  Resp (!) 30  Oxygen Therapy  SpO2 93 %  O2 Device Nasal Cannula  O2 Flow Rate (L/min) 2.5 L/min  MEWS Score  MEWS RR 2  MEWS Pulse 2  MEWS Systolic 0  MEWS LOC 0  MEWS Temp 0  MEWS Score 4  MEWS Score Color Red  MEWS Assessment  Is this an acute change? No  Provider Notification  Provider Name/Title Craige Cotta NP  Date Provider Notified 03/26/19  Time Provider Notified 2136  Notification Type Page  Notification Reason Other (Comment) (Red MEWS- no acute change)  Response No new orders     03/26/19 2020  Vitals  Temp 98 F (36.7 C)  Temp Source Oral  BP 117/77  MAP (mmHg) 90  BP Location Right Arm  BP Method Automatic  Patient Position (if appropriate) Lying  Pulse Rate (!) 115  Pulse Rate Source Dinamap  Resp (!) 30  Oxygen Therapy  SpO2 93 %  O2 Device Nasal Cannula  O2 Flow Rate (L/min) 2.5 L/min  MEWS Score  MEWS RR 2  MEWS Pulse 2  MEWS Systolic 0  MEWS LOC 0  MEWS Temp 0  MEWS Score 4  MEWS Score Color Red  MEWS Assessment  Is this an acute change? No  Provider Notification  Provider Name/Title Craige Cotta NP  Date Provider Notified 03/26/19  Time Provider Notified 2136  Notification Type Page  Notification Reason Other (Comment) (Red MEWS- no acute change)  Response No new orders   Pt's MEWS score turned red. No acute changes with pt. RR 30s. Pt with Rhonchi in bilateral lung fields, however, no distress noted. HR 110s-117. PRN order for metoprolol if HR sustains > 125. Pt's oxygen saturation 88% 2.5L placed on pt. Oxygen saturation now 94. On call provider made aware. Will continue to monitor closely.

## 2019-03-26 NOTE — Progress Notes (Signed)
OT Cancellation Note  Patient Details Name: Benjamin Cohen MRN: [id number] DOB: 01-03-1956   Cancelled Treatment:    Reason Eval/Treat Not Completed: Other (comment).  Pt was busy with nursing; plan to check back this pm  [name] 03/26/2019, 11:33 AM  Marica Otter, OTR/L Acute Rehabilitation Services [phone] [facility] pager [phone] office 03/26/2019

## 2019-03-26 NOTE — Plan of Care (Signed)
  Problem: Health Behavior/Discharge Planning: Goal: Ability to manage health-related needs will improve Outcome: Progressing   Problem: Clinical Measurements: Goal: Ability to maintain clinical measurements within normal limits will improve Outcome: Progressing Goal: Will remain free from infection Outcome: Progressing Goal: Diagnostic test results will improve Outcome: Progressing Goal: Respiratory complications will improve Outcome: Progressing Goal: Cardiovascular complication will be avoided Outcome: Progressing   Problem: Nutrition: Goal: Adequate nutrition will be maintained Outcome: Progressing   Problem: Elimination: Goal: Will not experience complications related to bowel motility Outcome: Progressing   Problem: Nutrition: Goal: Adequate nutrition will be maintained Outcome: Progressing   Problem: Safety: Goal: Ability to remain free from injury will improve Outcome: Progressing   Problem: Skin Integrity: Goal: Risk for impaired skin integrity will decrease Outcome: Progressing

## 2019-03-26 NOTE — Progress Notes (Signed)
Attempted to place a 10 Fr panda feeding tube for nutrition. Sat the patient upright in high fowlers and coached the patient to try to swallow once placing tube. Patient was able to follow these basic instructions. Patient was alert. Attempted to place in right and left nares but unsuccessful. Was able to advance through the nasal passageways on both attempts but patient began to cough violently once the tube was at the 30 to 40 cm mark. Tube was removed due to risk of placing in lungs. Patient stable after removal. Witnessed by [employer] on 4th floor. Notified IR in regards to unsuccessful attempts. Spoke with an IR MD due to situation. Three attempts were tried yesterday as well.

## 2019-03-26 NOTE — Progress Notes (Signed)
  Speech Language Pathology Treatment: Dysphagia  Patient Details Name: Benjamin Cohen MRN: [id number] DOB: [birth year] Today's Date: 03/26/2019 Time: 4098-1191 SLP Time Calculation (min) (ACUTE ONLY): 31 min  Assessment / Plan / Recommendation Clinical Impression  Pt sitting upright in bed, looking at the television.  He did respond to SLPs questions as best able - following direction better than 2 days prior.  Tongue with slight coating *tan colored*.  Pt continues with grossly weak voice nearly aphonic and weak cough x3 during session.  Suspect cough was due to aspiration of small amount of nectar, and teaspoon amount of water.  Pt's cough (even encouraged) is weak and not effective.   Again pt wincing with swallowing but denies pain.  He orally held tsp of water for up to 22 seconds finally with anterior loss (right) without awareness.     SLP questions if pt may have pharyngeal edema from ETT now possibly exacerbated with trying to place feeding tube.  This pt is not appropriate for po diet at this time due to his gross deconditioning, cognitive related oral deficit and prolonged vent requirement.    Continue to recommend small single ice chips to decrease disuse muscle atrophy and maximize oral care.  Ice chip ONLY after oral care and assuring oral suction completed if pt does not swallow. Advised to pt concerns re: aspiration risk and recovery from ARDS using teach back.    Pt will benefit from instrumental swallow evaluation *MBS* prior to starting po due to his multiple risk factors. Advised pt to speech/swallow goals being to elicit swallow and be able to phonate.   He reported understanding to information provided by this SLP but uncertain to his ability to comprehend.      HPI HPI: 63 yo male adm to [facility] with respiratory issues 03/11/2019 -  fever, confusion, cough, dyspnea - diagnosed with COVID 19. Pt required intubation 03/13/2019 to 03/22/2019 suffering from ARDS.  He also has h/o CVA  2014 and 04/2018, HTN.  BSE done 11/12/2013 left labial and lingual weakness with recommendation for regular/thin diet.  Prior imaging studies showed small chronic anterior bilateral basal ganglia cva, right cerebellar cva and right posterior lentiform nucleus.  He also has spondylosis from C3-T1 - worse C3-C4, C6-C7.  Pt recent CXR showed persistent lung disease both lungs but improved.  He is on HFNC 5%.  Swallow evaluation ordered.          SLP Plan  Continue with current plan of care       Recommendations  Diet recommendations: NPO(single small ice chips only) Medication Administration: Via alternative means                Oral Care Recommendations: Oral care QID Follow up Recommendations: Skilled Nursing facility(snf) SLP Visit Diagnosis: Dysphagia, oropharyngeal phase (R13.12);Aphonia (R49.1) Plan: Continue with current plan of care       GO               Donavan Burnet, MS [facility] SLP Acute Rehab Services Pager [phone] Office [phone]  Chales Abrahams 03/26/2019, 2:32 PM

## 2019-03-26 NOTE — Progress Notes (Signed)
Patient weak, unable to move himself in bed, weak hand grips. Patient nods yes or no when asked questions. Respiration in low 20's, O2 sats mid 90's on room air.

## 2019-03-26 NOTE — Progress Notes (Signed)
PROGRESS NOTE                                                                                                                                                                                                             Patient Demographics:    Benjamin Cohen, is a 63 y.o. male, DOB - [birth year], YQM:[id number]  Admit date - 03/11/2019   Admitting Physician Karmen Bongo, MD  Outpatient Primary MD for the patient is Scifres, Durel Salts  LOS - 87   Chief Complaint  Patient presents with   Altered Mental Status       Brief Narrative   63 y/o M admitted 3/31 with reports of fever, cough, SOB and confusion.  He was recently at a wedding mid March and after multiple family members were diagnosed with COVID including his wife.  Developed worsening hypoxemia, respiratory distress 4/1 and PCCM consulted for evaluation.  Intubated 4/2 with ARDS.   3/31 Admit, 2L, bilateral patchy infiltrates  4/01 PCCM consulted 4/02 Intubated 4/03 toclizamab x 1 4/05 Net positive, remains on paralytic, still prone 4/06 Supine, plateau pressure 23, 50% / 12 PEEP; complete 5 days of plaquenil 4/08 Plateau 23, PEEP 12 / 40% 4/14 transfer to floor bed    Subjective:    [name] today self unable to provide any complaints, but per discussion with staff, his NGT was dislodged yesterday, supposed to have tried multiple attempts with no success .   Assessment  & Plan :    Principal Problem:   Acute respiratory failure with hypoxia (HCC) Active Problems:   Diabetes ([place])   HLD (hyperlipidemia)   CVA (cerebral vascular accident) ([place])   Pressure injury of skin  Acute hypoxic respiratory failure/ARDS secondary to COVID-19 -Patient required intubation, he was successfully extubated for 11/30/2019 -He received Actemra -He was treated with Plaquenil x5 days  Hypertension -Blood pressure is elevated, continue with Norvasc, I will add PRN  hydralazine  Hyperlipidemia -Resume statin once stable  Hx of CVA -Continue with statin and Plavix  Diabetes mellitus type II -Continue with Lantus and insulin sliding scale  Hypernatremia -From diuresis, and no oral intake, no NGT over last 24 hours despite multiple attempts, will change his fluid to D5W, monitor BMP daily. -Holding Lasix  Dysphagia. -Discussed with SLP, patient is extremely frail, ready for oral intake yet, will  plan to continue with feeding through NGT  Deconditioning. - PT/OT, extremely weak and debilitated    Code Status : Full Code  Family Communication  : None at bedside  Disposition Plan  : pending further PT  Consults  :  PCCM  Procedures  :  ETT 4/2 >> 4/12 LUE PICC 4/2 >>   DVT Prophylaxis  :   Lab Results  Component Value Date   PLT 258 03/26/2019    Antibiotics  :    Anti-infectives (From admission, onward)   Start     Dose/Rate Route Frequency Ordered Stop   03/14/19 1000  hydroxychloroquine (PLAQUENIL) tablet 200 mg     200 mg Per Tube 2 times daily 03/14/19 0759 03/17/19 0923   03/13/19 2200  hydroxychloroquine (PLAQUENIL) tablet 200 mg  Status:  Discontinued     200 mg Oral 2 times daily 03/12/19 1615 03/14/19 0759   03/13/19 1000  hydroxychloroquine (PLAQUENIL) tablet 400 mg     400 mg Oral  Once 03/12/19 1615 03/13/19 1129   03/12/19 1700  hydroxychloroquine (PLAQUENIL) tablet 400 mg     400 mg Oral  Once 03/12/19 1615 03/12/19 1704   03/11/19 1215  cefTRIAXone (ROCEPHIN) 2 g in sodium chloride 0.9 % 100 mL IVPB     2 g 200 mL/hr over 30 Minutes Intravenous Every 24 hours 03/11/19 1210 03/15/19 1220   03/11/19 1215  azithromycin (ZITHROMAX) 500 mg in sodium chloride 0.9 % 250 mL IVPB     500 mg 250 mL/hr over 60 Minutes Intravenous Every 24 hours 03/11/19 1210 03/15/19 1321        Objective:   Vitals:   03/26/19 0411 03/26/19 0755 03/26/19 0955 03/26/19 1136  BP: (!) 157/100 (!) 153/95 (!) 153/106 (!) 143/105   Pulse: 98 93 97 (!) 102  Resp: (!) 22 (!) 21 20 (!) 22  Temp: 99 F (37.2 C) 98.3 F (36.8 C)  98.6 F (37 C)  TempSrc: Oral Oral  Oral  SpO2: 94% 96% 98% 96%  Weight: 83.2 kg     Height:        Wt Readings from Last 3 Encounters:  03/26/19 83.2 kg  05/27/18 95.3 kg  04/30/18 95.7 kg     Intake/Output Summary (Last 24 hours) at 03/26/2019 1341 Last data filed at 03/26/2019 0645 Gross per 24 hour  Intake 827.29 ml  Output 1075 ml  Net -247.71 ml     Physical Exam  Awake Alert,follow simple commands, in bed in no apparent distress Symmetrical Chest wall movement, diminished air entry at the bases, no wheezing RRR,No Gallops,Rubs or new Murmurs, No Parasternal Heave +ve B.Sounds, Abd Soft, No rebound - guarding or rigidity. No Cyanosis, Clubbing or edema, No new Rash or bruise     Data Review:    CBC Recent Labs  Lab 03/21/19 0500 03/22/19 0500 03/25/19 0511 03/26/19 0823  WBC 6.2 5.4 7.1 6.1  HGB 12.1* 11.6* 13.6 13.8  HCT 41.1 38.5* 47.4 46.7  PLT 324 301 284 258  MCV 90.1 89.5 91.2 91.4  MCH 26.5 27.0 26.2 27.0  MCHC 29.4* 30.1 28.7* 29.6*  RDW 13.6 13.7 14.0 14.3    Chemistries  Recent Labs  Lab 03/20/19 0618 03/21/19 0500 03/22/19 0500 03/25/19 0511 03/26/19 0823  NA 146* 148* 147* 154* 156*  K 3.8 4.0 4.4 3.4* 3.2*  CL 106 106 108 119* 123*  CO2 30 30 28 26 25   GLUCOSE 282* 337* 320* 197* 189*  BUN 39*  48* 50* 43* 42*  CREATININE 1.02 1.03 1.16 1.04 1.17  CALCIUM 8.8* 9.4 8.8* 9.3 9.3  MG  --   --   --  2.9* 3.0*  AST  --   --   --   --  26  ALT  --   --   --   --  38  ALKPHOS  --   --   --   --  99  BILITOT  --   --   --   --  1.4*   ------------------------------------------------------------------------------------------------------------------ Recent Labs    03/24/19 0656  TRIG 301*    Lab Results  Component Value Date   HGBA1C 12.4 (H) 03/11/2019    ------------------------------------------------------------------------------------------------------------------ No results for input(s): TSH, T4TOTAL, T3FREE, THYROIDAB in the last 72 hours.  Invalid input(s): FREET3 ------------------------------------------------------------------------------------------------------------------ No results for input(s): VITAMINB12, FOLATE, FERRITIN, TIBC, IRON, RETICCTPCT in the last 72 hours.  Coagulation profile No results for input(s): INR, PROTIME in the last 168 hours.  No results for input(s): DDIMER in the last 72 hours.  Cardiac Enzymes No results for input(s): CKMB, TROPONINI, MYOGLOBIN in the last 168 hours.  Invalid input(s): CK ------------------------------------------------------------------------------------------------------------------ No results found for: BNP  Inpatient Medications  Scheduled Meds:  amLODipine  5 mg Per Tube QAC breakfast   atorvastatin  40 mg Per Tube q1800   chlorhexidine gluconate (MEDLINE KIT)  15 mL Mouth Rinse BID   Chlorhexidine Gluconate Cloth  6 each Topical Daily   clopidogrel  75 mg Per Tube QAC breakfast   enoxaparin (LOVENOX) injection  40 mg Subcutaneous Q24H   feeding supplement (PRO-STAT SUGAR FREE 64)  60 mL Per Tube Q24H   insulin aspart  0-15 Units Subcutaneous TID AC & HS   insulin glargine  10 Units Subcutaneous Daily   nutrition supplement (JUVEN)  1 packet Per Tube BID BM   sodium chloride flush  10-40 mL Intracatheter Q12H   sodium chloride flush  3 mL Intravenous Q12H   Continuous Infusions:  dextrose 5 % with kcl     feeding supplement (OSMOLITE 1.5 CAL) Stopped (03/25/19 1718)   PRN Meds:.acetaminophen (TYLENOL) oral liquid 160 mg/5 mL **OR** acetaminophen, bisacodyl, hydrALAZINE, ipratropium, labetalol, LORazepam, metoprolol tartrate, [DISCONTINUED] ondansetron **OR** ondansetron (ZOFRAN) IV, oxyCODONE, polyethylene glycol, sodium chloride flush  Micro  Results No results found for this or any previous visit (from the past 240 hour(s)).  Radiology Reports Dg Chest 1 View  Result Date: 03/23/2019 CLINICAL DATA:  Initial evaluation for COVID-19 infection EXAM: CHEST  1 VIEW COMPARISON:  Prior radiograph from 03/21/2019 FINDINGS: Patient is intubated with the tip of the endotracheal tube positioned 2.4 cm above the carina. Left-sided PICC catheter in place with tip overlying the distal SVC. Enteric tube courses into the abdomen. Cardiac and mediastinal silhouette stable, and remain within normal limits. Lungs mildly hypoinflated. Scattered patchy multifocal bilateral airspace opacities persist, but are overall improved from previous exam. Underlying mild pulmonary interstitial congestion without overt pulmonary edema. No pleural effusion. No pneumothorax. Osseous structures unchanged. IMPRESSION: 1. Tip of the endotracheal tube positioned 2.4 cm above the carina. Remaining support apparatus in satisfactory position. 2. Persistent but improved multifocal bilateral airspace opacities with improved aeration of both lungs. Electronically Signed   By: Jeannine Boga M.D.   On: 03/23/2019 05:46   Dg Chest Port 1 View  Result Date: 03/21/2019 CLINICAL DATA:  ARDS secondary to COVID-19. EXAM: PORTABLE CHEST 1 VIEW COMPARISON:  03/18/2019 FINDINGS: Endotracheal tube tip is 3.5 cm above the  carina. Orogastric or nasogastric tube enters the stomach. Left arm PICC tip is in the SVC at the SVC RA junction. Widespread patchy bilateral pulmonary infiltrates persist, improved on the right at somewhat worsened in the left lower lobe. IMPRESSION: Lines and tubes satisfactory. Improvement in pulmonary infiltrate on the right. Some worsening of infiltrate and/or atelectasis in the left lower lobe. Electronically Signed   By: Nelson Chimes M.D.   On: 03/21/2019 05:07   Dg Chest Port 1 View  Result Date: 03/18/2019 CLINICAL DATA:  Hypoxia EXAM: PORTABLE CHEST 1 VIEW  COMPARISON:  Three days ago FINDINGS: Endotracheal tube tip at the clavicular heads. PICC with tip at the right atrium. The orogastric tube at least reaches the stomach. Bilateral airspace disease in the setting of ARDS/viral pneumonia. No convincing change from yesterday when allowing for differences in rotation. No effusion or pneumothorax. Normal heart size IMPRESSION: Stable hardware positioning and bilateral airspace disease. Electronically Signed   By: Monte Fantasia M.D.   On: 03/18/2019 06:17   Dg Chest Port 1 View  Result Date: 03/15/2019 CLINICAL DATA:  Acute respiratory failure. Intubated patient. Positive for COVID-19. Follow-up exam. EXAM: PORTABLE CHEST 1 VIEW COMPARISON:  03/13/2019 and older exams. FINDINGS: Bilateral airspace opacities have progressed since the most recent prior exam, but are more similar to the exam from 03/13/2019 at 4:04 a.m. Increased consolidations most evident in the lung bases, as well as in the inferior right upper lobe. No pneumothorax. Endotracheal tube, nasogastric tube and left PICC are stable. IMPRESSION: 1. Lung aeration appears worsened compared to the most recent prior exam, 03/13/2019 at 9:39 a.m., but similar to the pre intubation exam from 03/13/2019 at 4:04 a.m. Some of this apparent change is likely technical due to larger lung volumes on the most recent prior study. However, lung opacities appear convincingly increased at the bases compared to the prior studies, some of which is likely atelectasis. 2. No other change.  Stable support apparatus. Electronically Signed   By: Lajean Manes M.D.   On: 03/15/2019 05:31   Dg Chest Port 1 View  Result Date: 03/13/2019 CLINICAL DATA:  63 year old with confirmed diagnosis of COVID-19. Acute respiratory distress requiring intubation. EXAM: PORTABLE CHEST 1 VIEW 9:39 a.m.: COMPARISON:  Chest x-ray earlier same day 4:04 a.m. and previously. FINDINGS: Endotracheal tube tip in satisfactory position projecting  approximately 5 cm above the carina. Gastric tube tip in the distal stomach. Improved aeration in both lungs post intubation, with patchy airspace opacities scattered throughout both lungs. No visible pleural effusions. IMPRESSION: 1. Endotracheal tube tip in satisfactory position projecting approximately 5 cm above the carina. 2. Gastric tube tip in the distal stomach. 3. Improved aeration in both lungs post intubation, with patchy pneumonia scattered throughout both lungs. Electronically Signed   By: Evangeline Dakin M.D.   On: 03/13/2019 10:22   Dg Chest Port 1 View  Result Date: 03/13/2019 CLINICAL DATA:  Pulmonary infiltrates. EXAM: PORTABLE CHEST 1 VIEW COMPARISON:  03/11/2019 FINDINGS: Patchy bilateral airspace disease with significant worsening. The heart is largely obscured without evident change in size. Lung volumes are low. Where there is aeration, no Kerley lines are seen. No pleural fluid or pneumothorax IMPRESSION: Bilateral pneumonia with significant progression compared to 03/11/2019. Electronically Signed   By: Monte Fantasia M.D.   On: 03/13/2019 07:19   Dg Chest Port 1 View  Result Date: 03/11/2019 CLINICAL DATA:  Cough EXAM: PORTABLE CHEST 1 VIEW COMPARISON:  01/19/2018 FINDINGS: Examination is degraded due to portable  technique. Grossly unchanged cardiac silhouette and mediastinal contours given AP projection. Potential developing slightly nodular airspace opacities within the bilateral upper lungs. No pleural effusion or pneumothorax no evidence of edema. No acute osseous abnormalities. IMPRESSION: Potential developing nodular airspace opacities within the bilateral upper lungs, potentially artifactual due to technique though developing infection could have a similar appearance. Clinical correlation is advised. Further evaluation with a PA and lateral chest radiograph may be obtained as clinically indicated. Electronically Signed   By: Sandi Mariscal M.D.   On: 03/11/2019 13:02   Dg Abd  Portable 1v  Result Date: 03/25/2019 CLINICAL DATA:  Evaluate feeding tube EXAM: PORTABLE ABDOMEN - 1 VIEW COMPARISON:  None. FINDINGS: The distal tip of the feeding tube now all flips back on itself and is probably in the distal stomach. IMPRESSION: The distal tip of the feeding tube is likely in the stomach, directed to the left. Electronically Signed   By: Dorise Bullion III M.D   On: 03/25/2019 16:43   Dg Abd Portable 1v  Result Date: 03/25/2019 CLINICAL DATA:  Evaluate feeding tube. EXAM: PORTABLE ABDOMEN - 1 VIEW COMPARISON:  March 25, 2019 FINDINGS: More of the feeding tube is located in the stomach. The distal tip is likely in the proximal duodenum, it least 14 cm proximal to the ligament of Treitz. IMPRESSION: Much of the advancement resulted in increased feeding tube in the gastric fundus. However, the distal tip has been advanced approximately 1 cm and likely terminates in the proximal duodenum, at least 14 cm proximal to the ligament of Treitz. Electronically Signed   By: Dorise Bullion III M.D   On: 03/25/2019 14:17   Dg Abd Portable 1v  Result Date: 03/25/2019 CLINICAL DATA:  Evaluate tube placement EXAM: PORTABLE ABDOMEN - 1 VIEW COMPARISON:  November 30, 2013 FINDINGS: A feeding tube terminates in the right mid abdomen, either in the distal stomach or proximal duodenum. IMPRESSION: A feeding tube terminates in the right mid abdomen, either in the distal stomach or proximal most aspect of the duodenum. Recommend advancement before use. Electronically Signed   By: Dorise Bullion III M.D   On: 03/25/2019 12:39   [place] Ekg Site Rite  Result Date: 03/13/2019 If Site Rite image not attached, placement could not be confirmed due to current cardiac rhythm.   Phillips Climes M.D on 03/26/2019 at 1:41 PM  Between 7am to 7pm - Pager - [phone]  After 7pm go to [email or web address] - password [facility]  [facility] -  Office  [phone]

## 2019-03-27 ENCOUNTER — Inpatient Hospital Stay (HOSPITAL_COMMUNITY): Payer: 59

## 2019-03-27 LAB — CBC
HCT: 43.2 % (ref 39.0–52.0)
Hemoglobin: 12.2 g/dL — ABNORMAL LOW (ref 13.0–17.0)
MCH: 26 pg (ref 26.0–34.0)
MCHC: 28.2 g/dL — ABNORMAL LOW (ref 30.0–36.0)
MCV: 92.1 fL (ref 80.0–100.0)
Platelets: 220 10*3/uL (ref 150–400)
RBC: 4.69 MIL/uL (ref 4.22–5.81)
RDW: 14.3 % (ref 11.5–15.5)
WBC: 6.6 10*3/uL (ref 4.0–10.5)
nRBC: 0 % (ref 0.0–0.2)

## 2019-03-27 LAB — BASIC METABOLIC PANEL
Anion gap: 5 (ref 5–15)
Anion gap: 8 (ref 5–15)
BUN: 39 mg/dL — ABNORMAL HIGH (ref 8–23)
BUN: 40 mg/dL — ABNORMAL HIGH (ref 8–23)
CO2: 23 mmol/L (ref 22–32)
CO2: 24 mmol/L (ref 22–32)
Calcium: 8.2 mg/dL — ABNORMAL LOW (ref 8.9–10.3)
Calcium: 8.6 mg/dL — ABNORMAL LOW (ref 8.9–10.3)
Chloride: 117 mmol/L — ABNORMAL HIGH (ref 98–111)
Chloride: 121 mmol/L — ABNORMAL HIGH (ref 98–111)
Creatinine, Ser: 1.31 mg/dL — ABNORMAL HIGH (ref 0.61–1.24)
Creatinine, Ser: 1.4 mg/dL — ABNORMAL HIGH (ref 0.61–1.24)
GFR calc Af Amer: >60 mL/min (ref >60)
GFR calc Af Amer: >60 mL/min (ref >60)
GFR calc non Af Amer: 57 mL/min — ABNORMAL LOW (ref >60)
GFR calc non Af Amer: 52 mL/min — ABNORMAL LOW (ref >60)
Glucose, Bld: 561 mg/dL (ref 70–99)
Glucose, Bld: 317 mg/dL — ABNORMAL HIGH (ref 70–99)
Potassium: 6.9 mmol/L (ref 3.5–5.1)
Potassium: 3.9 mmol/L (ref 3.5–5.1)
Sodium: 145 mmol/L (ref 135–145)
Sodium: 153 mmol/L — ABNORMAL HIGH (ref 135–145)

## 2019-03-27 LAB — GLUCOSE, CAPILLARY
Glucose-Capillary: 285 mg/dL — ABNORMAL HIGH (ref 70–99)
Glucose-Capillary: 275 mg/dL — ABNORMAL HIGH (ref 70–99)
Glucose-Capillary: 266 mg/dL — ABNORMAL HIGH (ref 70–99)
Glucose-Capillary: 297 mg/dL — ABNORMAL HIGH (ref 70–99)
Glucose-Capillary: 251 mg/dL — ABNORMAL HIGH (ref 70–99)

## 2019-03-27 MED ORDER — FUROSEMIDE 10 MG/ML IJ SOLN
40.0000 mg | Freq: Every day | INTRAMUSCULAR | Status: DC
  Administered 2019-03-27: 40 mg via INTRAVENOUS
  Filled 2019-03-27: qty 4

## 2019-03-27 MED ORDER — FREE WATER
300.0000 mL | Freq: Three times a day (TID) | Status: DC
  Administered 2019-03-27 – 2019-03-28 (×3): 300 mL

## 2019-03-27 MED ORDER — INSULIN GLARGINE 100 UNIT/ML ~~LOC~~ SOLN
15.0000 [IU] | Freq: Every day | SUBCUTANEOUS | Status: DC
  Filled 2019-03-27: qty 0.15

## 2019-03-27 MED ORDER — INSULIN GLARGINE 100 UNIT/ML ~~LOC~~ SOLN
5.0000 [IU] | Freq: Once | SUBCUTANEOUS | Status: AC
  Administered 2019-03-27: 14:00:00 5 [IU] via SUBCUTANEOUS
  Filled 2019-03-27: qty 0.05

## 2019-03-27 NOTE — Evaluation (Signed)
Physical Therapy Evaluation Patient Details Name: Benjamin Cohen MRN: [id number] DOB: March 05, 1956 Today's Date: 03/27/2019   History of Present Illness  Pt was admitted for acute respiratory failure.  + for COVID 19, s/p intubation 4/2-4/11.  PMH:  CVA 5/19 and HTN  Clinical Impression  Pt admitted as above and presenting with functional mobility limitations 2* severe deconditioning, weakness and balance deficits.  Pt would benefit from follow up rehab at SNF level to maximize IND and safety prior to return home with ltd assist.    Follow Up Recommendations SNF    Equipment Recommendations       Recommendations for Other Services       Precautions / Restrictions Precautions Precautions: Fall Restrictions Weight Bearing Restrictions: No      Mobility  Bed Mobility Overal bed mobility: Needs Assistance Bed Mobility: Rolling;Sidelying to Sit Rolling: Max assist;+2 for physical assistance Sidelying to sit: Total assist;+2 for physical assistance       General bed mobility comments: assist for legs and trunk  Transfers Overall transfer level: Needs assistance Equipment used: 2 person hand held assist Transfers: Squat Pivot Transfers     Squat pivot transfers: Max assist;+2 physical assistance        Ambulation/Gait                Stairs            Wheelchair Mobility    Modified Rankin (Stroke Patients Only)       Balance Overall balance assessment: Needs assistance Sitting-balance support: Bilateral upper extremity supported;Feet supported Sitting balance-Leahy Scale: Poor Sitting balance - Comments: mostly min A for balance; mod as he fatiqued.  Pt leaned anteriorly and to L                                     Pertinent Vitals/Pain Pain Assessment: Faces Faces Pain Scale: No hurt    Home Living Family/patient expects to be discharged to:: Unsure Living Arrangements: Alone               Additional Comments: per  nursing admission notes, pt lived alone prior to admission    Prior Function Level of Independence: Independent               Hand Dominance   Dominant Hand: Right    Extremity/Trunk Assessment   Upper Extremity Assessment Upper Extremity Assessment: Generalized weakness;Defer to OT evaluation RUE Deficits / Details: both UEs very weak, generally 2+/5.  h/o L weakness after CVA.  Able to grip with bil hands 3-/5 to 3/5    Lower Extremity Assessment Lower Extremity Assessment: Generalized weakness;RLE deficits/detail;LLE deficits/detail RLE Deficits / Details: ~2/5 LLE Deficits / Details: ~2-/5       Communication   Communication: (very little verbalization)  Cognition Arousal/Alertness: Awake/alert Behavior During Therapy: Flat affect Overall Cognitive Status: Difficult to assess                                 General Comments: followed commands; pt weak      General Comments      Exercises     Assessment/Plan    PT Assessment Patient needs continued PT services  PT Problem List Decreased strength;Decreased range of motion;Decreased activity tolerance;Decreased balance;Decreased mobility;Decreased knowledge of use of DME       PT Treatment Interventions DME  instruction;Gait training;Functional mobility training;Therapeutic activities;Therapeutic exercise;Patient/family education    PT Goals (Current goals can be found in the Care Plan section)  Acute Rehab PT Goals Patient Stated Goal: none stated; agreeable to therapy PT Goal Formulation: With patient Time For Goal Achievement: 04/10/19 Potential to Achieve Goals: Fair    Frequency Min 3X/week   Barriers to discharge Decreased caregiver support Lived alone    Co-evaluation PT/OT/SLP Co-Evaluation/Treatment: Yes Reason for Co-Treatment: Complexity of the patient's impairments (multi-system involvement);For patient/therapist safety PT goals addressed during session: Mobility/safety  with mobility OT goals addressed during session: Strengthening/ROM       AM-PAC PT "6 Clicks" Mobility  Outcome Measure Help needed turning from your back to your side while in a flat bed without using bedrails?: Total Help needed moving from lying on your back to sitting on the side of a flat bed without using bedrails?: Total Help needed moving to and from a bed to a chair (including a wheelchair)?: Total Help needed standing up from a chair using your arms (e.g., wheelchair or bedside chair)?: Total Help needed to walk in hospital room?: Total Help needed climbing 3-5 steps with a railing? : Total 6 Click Score: 6    End of Session Equipment Utilized During Treatment: Gait belt Activity Tolerance: Patient limited by fatigue Patient left: in chair;with call bell/phone within reach;with chair alarm set Nurse Communication: Mobility status PT Visit Diagnosis: Difficulty in walking, not elsewhere classified (R26.2);Muscle weakness (generalized) (M62.81)    Time: [id number] PT Time Calculation (min) (ACUTE ONLY): 38 min   Charges:   PT Evaluation $PT Eval Low Complexity: 1 Low PT Treatments $Therapeutic Activity: 8-22 mins        Mauro KaufmannHunter Clarivel Callaway PT Acute Rehabilitation Services Pager [phone] Office [phone]   Brent[facility] Surgery Center LLCBRADSHAW,Angelika Jerrett 03/27/2019, 12:37 PM

## 2019-03-27 NOTE — Progress Notes (Signed)
PROGRESS NOTE                                                                                                                                                                                                             Patient Demographics:    Benjamin Cohen, is a 63 y.o. male, DOB - 1956-05-06, JSE:[id number]  Admit date - 03/11/2019   Admitting Physician Karmen Bongo, MD  Outpatient Primary MD for the patient is Scifres, Durel Salts  LOS - 25   Chief Complaint  Patient presents with  . Altered Mental Status       Brief Narrative   63 y/o M admitted 3/31 with reports of fever, cough, SOB and confusion.  He was recently at a wedding mid March and after multiple family members were diagnosed with COVID including his wife.  Developed worsening hypoxemia, respiratory distress 4/1 and PCCM consulted for evaluation.  Intubated 4/2 with ARDS.   3/31 Admit, 2L, bilateral patchy infiltrates  4/01 PCCM consulted 4/02 Intubated 4/03 toclizamab x 1 4/05 Net positive, remains on paralytic, still prone 4/06 Supine, plateau pressure 23, 50% / 12 PEEP; complete 5 days of plaquenil 4/08 Plateau 23, PEEP 12 / 40% 4/14 transfer to floor bed    Subjective:    Makye Radle today still unable to provide any complaints, but per staff he did require oxygen overnight, 4 L nasal cannula, I was able to wean to 3 L nasal cannula currently, has NGT inserted yesterday and resumed on tube feed .   Assessment  & Plan :    Principal Problem:   Acute respiratory failure with hypoxia (HCC) Active Problems:   Diabetes ([place])   HLD (hyperlipidemia)   CVA (cerebral vascular accident) ([place])   Pressure injury of skin  Acute hypoxic respiratory failure/ARDS secondary to COVID-19 -Patient required intubation, he was successfully extubated for 11/30/2019 -He received Actemra -He was treated with Plaquenil x5 days -Today he is back on 3 L nasal cannula, chest x-ray was  reviewed, showing left lung base atelectasis, questionable right lung base pneumonia with some pleural effusion, patient remains afebrile, with no leukocytosis, I will hold initiating antibiotics, encourage staff to continue incentive spirometry and ambulating patient for now, start on low-dose Lasix giving some pleural effusion.  Hypertension -Blood pressure is elevated, continue with Norvasc, continue with PRN hydralazine  Hyperlipidemia -Resume statin once stable  Hx of CVA -Continue with statin and Plavix  Diabetes mellitus type II -CBG started to increase now he is on tube feed, and D5W, increase his Lantus to 15 units subcu daily, continue with insulin sliding scale every 6 hours  Hypernatremia -Volume depletion, diuresis, and no oral intake given no NG tube for more than 24 hours, it started to improve on D5W with potassium, will continue with current rate, and now he has NG tube I will start on free water as well .  AKI -He is 1.4 today, will monitor closely, patient has been started on diuresis  Dysphagia. -Discussed with SLP, patient is extremely frail, ready for oral intake yet, will plan to continue with feeding through NGT  Deconditioning. - PT/OT, extremely weak and debilitated    Code Status : Full Code  Family Communication  : With son via phone [phone], and earlier this morning  Disposition Plan  : pending further PT  Consults  :  PCCM  Procedures  :  ETT 4/2 >> 4/12 LUE PICC 4/2 >>   DVT Prophylaxis  : Subcu Lovenox  Lab Results  Component Value Date   PLT 220 03/27/2019    Antibiotics  :    Anti-infectives (From admission, onward)   Start     Dose/Rate Route Frequency Ordered Stop   03/14/19 1000  hydroxychloroquine (PLAQUENIL) tablet 200 mg     200 mg Per Tube 2 times daily 03/14/19 0759 03/17/19 0923   03/13/19 2200  hydroxychloroquine (PLAQUENIL) tablet 200 mg  Status:  Discontinued     200 mg Oral 2 times daily 03/12/19 1615 03/14/19 0759    03/13/19 1000  hydroxychloroquine (PLAQUENIL) tablet 400 mg     400 mg Oral  Once 03/12/19 1615 03/13/19 1129   03/12/19 1700  hydroxychloroquine (PLAQUENIL) tablet 400 mg     400 mg Oral  Once 03/12/19 1615 03/12/19 1704   03/11/19 1215  cefTRIAXone (ROCEPHIN) 2 g in sodium chloride 0.9 % 100 mL IVPB     2 g 200 mL/hr over 30 Minutes Intravenous Every 24 hours 03/11/19 1210 03/15/19 1220   03/11/19 1215  azithromycin (ZITHROMAX) 500 mg in sodium chloride 0.9 % 250 mL IVPB     500 mg 250 mL/hr over 60 Minutes Intravenous Every 24 hours 03/11/19 1210 03/15/19 1321        Objective:   Vitals:   03/27/19 0400 03/27/19 0500 03/27/19 0603 03/27/19 0749  BP:   (!) 137/97 (!) 154/105  Pulse:   (!) 108 (!) 106  Resp:   (!) 24 (!) 24  Temp:   98.9 F (37.2 C) 99.2 F (37.3 C)  TempSrc:   Oral Oral  SpO2: 95%  97% 96%  Weight:  82.7 kg    Height:        Wt Readings from Last 3 Encounters:  03/27/19 82.7 kg  05/27/18 95.3 kg  04/30/18 95.7 kg     Intake/Output Summary (Last 24 hours) at 03/27/2019 1148 Last data filed at 03/27/2019 1023 Gross per 24 hour  Intake 1839.22 ml  Output 650 ml  Net 1189.22 ml     Physical Exam  I wake, alert, follows simple commands, easily distracted,  Significantly diminished air entry at the bases, but no use of accessory muscles, no wheezing Regular rate and rhythm, no rubs murmurs or gallops Soft, nontender, nondistended, bowel sounds present Extremities with no edema, clubbing or cyanosis no wheezing    Data  Review:    CBC Recent Labs  Lab 03/21/19 0500 03/22/19 0500 03/25/19 0511 03/26/19 0823 03/27/19 0500  WBC 6.2 5.4 7.1 6.1 6.6  HGB 12.1* 11.6* 13.6 13.8 12.2*  HCT 41.1 38.5* 47.4 46.7 43.2  PLT 324 301 284 258 220  MCV 90.1 89.5 91.2 91.4 92.1  MCH 26.5 27.0 26.2 27.0 26.0  MCHC 29.4* 30.1 28.7* 29.6* 28.2*  RDW 13.6 13.7 14.0 14.3 14.3    Chemistries  Recent Labs  Lab 03/22/19 0500 03/25/19 0511 03/26/19 0823  03/27/19 0500 03/27/19 0648  NA 147* 154* 156* 145 153*  K 4.4 3.4* 3.2* 6.9* 3.9  CL 108 119* 123* 117* 121*  CO2 _0 GLUCOSE 320* 197* 189* 561* 317*  BUN 50* 43* 42* 39* 40*  CREATININE 1.16 1.04 1.17 1.31* 1.40*  CALCIUM 8.8* 9.3 9.3 8.2* 8.6*  MG  --  2.9* 3.0*  --   --   AST  --   --  26  --   --   ALT  --   --  38  --   --   ALKPHOS  --   --  99  --   --   BILITOT  --   --  1.4*  --   --    ------------------------------------------------------------------------------------------------------------------ No results for input(s): CHOL, HDL, LDLCALC, TRIG, CHOLHDL, LDLDIRECT in the last 72 hours.  Lab Results  Component Value Date   HGBA1C 12.4 (H) 03/11/2019   ------------------------------------------------------------------------------------------------------------------ No results for input(s): TSH, T4TOTAL, T3FREE, THYROIDAB in the last 72 hours.  Invalid input(s): FREET3 ------------------------------------------------------------------------------------------------------------------ No results for input(s): VITAMINB12, FOLATE, FERRITIN, TIBC, IRON, RETICCTPCT in the last 72 hours.  Coagulation profile No results for input(s): INR, PROTIME in the last 168 hours.  No results for input(s): DDIMER in the last 72 hours.  Cardiac Enzymes No results for input(s): CKMB, TROPONINI, MYOGLOBIN in the last 168 hours.  Invalid input(s): CK ------------------------------------------------------------------------------------------------------------------ No results found for: BNP  Inpatient Medications  Scheduled Meds: . amLODipine  5 mg Per Tube QAC breakfast  . atorvastatin  40 mg Per Tube q1800  . chlorhexidine gluconate (MEDLINE KIT)  15 mL Mouth Rinse BID  . clopidogrel  75 mg Per Tube QAC breakfast  . enoxaparin (LOVENOX) injection  40 mg Subcutaneous Q24H  . feeding supplement (PRO-STAT SUGAR FREE 64)  60 mL Per Tube Q24H  . free water  300 mL Per  Tube Q8H  . insulin aspart  0-15 Units Subcutaneous TID AC & HS  . [START ON 03/28/2019] insulin glargine  15 Units Subcutaneous Daily  . insulin glargine  5 Units Subcutaneous Once  . nutrition supplement (JUVEN)  1 packet Per Tube BID BM  . sodium chloride flush  10-40 mL Intracatheter Q12H  . sodium chloride flush  3 mL Intravenous Q12H   Continuous Infusions: . dextrose 5 % with kcl 75 mL/hr at 03/27/19 1023  . feeding supplement (OSMOLITE 1.5 CAL) 35 mL/hr at 03/27/19 0842   PRN Meds:.acetaminophen (TYLENOL) oral liquid 160 mg/5 mL **OR** acetaminophen, bisacodyl, hydrALAZINE, ipratropium, labetalol, LORazepam, metoprolol tartrate, [DISCONTINUED] ondansetron **OR** ondansetron (ZOFRAN) IV, oxyCODONE, polyethylene glycol, sodium chloride flush  Micro Results No results found for this or any previous visit (from the past 240 hour(s)).  Radiology Reports Dg Chest 1 View  Result Date: 03/23/2019 CLINICAL DATA:  Initial evaluation for COVID-19 infection EXAM: CHEST  1 VIEW COMPARISON:  Prior radiograph from 03/21/2019 FINDINGS: Patient is intubated with the tip of  the endotracheal tube positioned 2.4 cm above the carina. Left-sided PICC catheter in place with tip overlying the distal SVC. Enteric tube courses into the abdomen. Cardiac and mediastinal silhouette stable, and remain within normal limits. Lungs mildly hypoinflated. Scattered patchy multifocal bilateral airspace opacities persist, but are overall improved from previous exam. Underlying mild pulmonary interstitial congestion without overt pulmonary edema. No pleural effusion. No pneumothorax. Osseous structures unchanged. IMPRESSION: 1. Tip of the endotracheal tube positioned 2.4 cm above the carina. Remaining support apparatus in satisfactory position. 2. Persistent but improved multifocal bilateral airspace opacities with improved aeration of both lungs. Electronically Signed   By: Jeannine Boga M.D.   On: 03/23/2019 05:46    Dg Chest Port 1 View  Result Date: 03/27/2019 CLINICAL DATA:  Hypoxia EXAM: PORTABLE CHEST 1 VIEW COMPARISON:  March 23, 2019 FINDINGS: Central catheter tip is at the cavoatrial junction. Nasogastric tube tip and side port are below the diaphragm. No pneumothorax. There is a small right pleural effusion with patchy airspace opacity in the right base. There is atelectatic change in the left base. Left lung otherwise clear. Heart is upper normal in size with pulmonary vascularity normal. No adenopathy. No bone lesions. IMPRESSION: Tube and catheter positions as described without pneumothorax. Small right pleural effusion with suspected superimposed right base pneumonia. Left base atelectasis noted. Heart upper normal in size. Electronically Signed   By: Lowella Grip III M.D.   On: 03/27/2019 09:57   Dg Chest Port 1 View  Result Date: 03/21/2019 CLINICAL DATA:  ARDS secondary to COVID-19. EXAM: PORTABLE CHEST 1 VIEW COMPARISON:  03/18/2019 FINDINGS: Endotracheal tube tip is 3.5 cm above the carina. Orogastric or nasogastric tube enters the stomach. Left arm PICC tip is in the SVC at the SVC RA junction. Widespread patchy bilateral pulmonary infiltrates persist, improved on the right at somewhat worsened in the left lower lobe. IMPRESSION: Lines and tubes satisfactory. Improvement in pulmonary infiltrate on the right. Some worsening of infiltrate and/or atelectasis in the left lower lobe. Electronically Signed   By: Nelson Chimes M.D.   On: 03/21/2019 05:07   Dg Chest Port 1 View  Result Date: 03/18/2019 CLINICAL DATA:  Hypoxia EXAM: PORTABLE CHEST 1 VIEW COMPARISON:  Three days ago FINDINGS: Endotracheal tube tip at the clavicular heads. PICC with tip at the right atrium. The orogastric tube at least reaches the stomach. Bilateral airspace disease in the setting of ARDS/viral pneumonia. No convincing change from yesterday when allowing for differences in rotation. No effusion or pneumothorax. Normal heart  size IMPRESSION: Stable hardware positioning and bilateral airspace disease. Electronically Signed   By: Monte Fantasia M.D.   On: 03/18/2019 06:17   Dg Chest Port 1 View  Result Date: 03/15/2019 CLINICAL DATA:  Acute respiratory failure. Intubated patient. Positive for COVID-19. Follow-up exam. EXAM: PORTABLE CHEST 1 VIEW COMPARISON:  03/13/2019 and older exams. FINDINGS: Bilateral airspace opacities have progressed since the most recent prior exam, but are more similar to the exam from 03/13/2019 at 4:04 a.m. Increased consolidations most evident in the lung bases, as well as in the inferior right upper lobe. No pneumothorax. Endotracheal tube, nasogastric tube and left PICC are stable. IMPRESSION: 1. Lung aeration appears worsened compared to the most recent prior exam, 03/13/2019 at 9:39 a.m., but similar to the pre intubation exam from 03/13/2019 at 4:04 a.m. Some of this apparent change is likely technical due to larger lung volumes on the most recent prior study. However, lung opacities appear convincingly increased at  the bases compared to the prior studies, some of which is likely atelectasis. 2. No other change.  Stable support apparatus. Electronically Signed   By: Lajean Manes M.D.   On: 03/15/2019 05:31   Dg Chest Port 1 View  Result Date: 03/13/2019 CLINICAL DATA:  63 year old with confirmed diagnosis of COVID-19. Acute respiratory distress requiring intubation. EXAM: PORTABLE CHEST 1 VIEW 9:39 a.m.: COMPARISON:  Chest x-ray earlier same day 4:04 a.m. and previously. FINDINGS: Endotracheal tube tip in satisfactory position projecting approximately 5 cm above the carina. Gastric tube tip in the distal stomach. Improved aeration in both lungs post intubation, with patchy airspace opacities scattered throughout both lungs. No visible pleural effusions. IMPRESSION: 1. Endotracheal tube tip in satisfactory position projecting approximately 5 cm above the carina. 2. Gastric tube tip in the distal  stomach. 3. Improved aeration in both lungs post intubation, with patchy pneumonia scattered throughout both lungs. Electronically Signed   By: Evangeline Dakin M.D.   On: 03/13/2019 10:22   Dg Chest Port 1 View  Result Date: 03/13/2019 CLINICAL DATA:  Pulmonary infiltrates. EXAM: PORTABLE CHEST 1 VIEW COMPARISON:  03/11/2019 FINDINGS: Patchy bilateral airspace disease with significant worsening. The heart is largely obscured without evident change in size. Lung volumes are low. Where there is aeration, no Kerley lines are seen. No pleural fluid or pneumothorax IMPRESSION: Bilateral pneumonia with significant progression compared to 03/11/2019. Electronically Signed   By: Monte Fantasia M.D.   On: 03/13/2019 07:19   Dg Chest Port 1 View  Result Date: 03/11/2019 CLINICAL DATA:  Cough EXAM: PORTABLE CHEST 1 VIEW COMPARISON:  01/19/2018 FINDINGS: Examination is degraded due to portable technique. Grossly unchanged cardiac silhouette and mediastinal contours given AP projection. Potential developing slightly nodular airspace opacities within the bilateral upper lungs. No pleural effusion or pneumothorax no evidence of edema. No acute osseous abnormalities. IMPRESSION: Potential developing nodular airspace opacities within the bilateral upper lungs, potentially artifactual due to technique though developing infection could have a similar appearance. Clinical correlation is advised. Further evaluation with a PA and lateral chest radiograph may be obtained as clinically indicated. Electronically Signed   By: Sandi Mariscal M.D.   On: 03/11/2019 13:02   Dg Abd Portable 1v  Result Date: 03/26/2019 CLINICAL DATA:  63 year old male with NG tube placement EXAM: PORTABLE ABDOMEN - 1 VIEW COMPARISON:  03/25/2019, FINDINGS: Gastric tube terminates in the region of the pylorus. Interval removal of weighted tip enteric feeding tube. Gas within stomach small bowel and colon. IMPRESSION: Gastric tube terminates near the  pylorus. Interval removal of the weighted tip enteric feeding tube. Electronically Signed   By: Corrie Mckusick D.O.   On: 03/26/2019 15:37   Dg Abd Portable 1v  Result Date: 03/25/2019 CLINICAL DATA:  Evaluate feeding tube EXAM: PORTABLE ABDOMEN - 1 VIEW COMPARISON:  None. FINDINGS: The distal tip of the feeding tube now all flips back on itself and is probably in the distal stomach. IMPRESSION: The distal tip of the feeding tube is likely in the stomach, directed to the left. Electronically Signed   By: Dorise Bullion III M.D   On: 03/25/2019 16:43   Dg Abd Portable 1v  Result Date: 03/25/2019 CLINICAL DATA:  Evaluate feeding tube. EXAM: PORTABLE ABDOMEN - 1 VIEW COMPARISON:  March 25, 2019 FINDINGS: More of the feeding tube is located in the stomach. The distal tip is likely in the proximal duodenum, it least 14 cm proximal to the ligament of Treitz. IMPRESSION: Much of the advancement  resulted in increased feeding tube in the gastric fundus. However, the distal tip has been advanced approximately 1 cm and likely terminates in the proximal duodenum, at least 14 cm proximal to the ligament of Treitz. Electronically Signed   By: Dorise Bullion III M.D   On: 03/25/2019 14:17   Dg Abd Portable 1v  Result Date: 03/25/2019 CLINICAL DATA:  Evaluate tube placement EXAM: PORTABLE ABDOMEN - 1 VIEW COMPARISON:  November 30, 2013 FINDINGS: A feeding tube terminates in the right mid abdomen, either in the distal stomach or proximal duodenum. IMPRESSION: A feeding tube terminates in the right mid abdomen, either in the distal stomach or proximal most aspect of the duodenum. Recommend advancement before use. Electronically Signed   By: Dorise Bullion III M.D   On: 03/25/2019 12:39   [place] Ekg Site Rite  Result Date: 03/13/2019 If Site Rite image not attached, placement could not be confirmed due to current cardiac rhythm.   Phillips Climes M.D on 03/27/2019 at 11:48 AM  Between 7am to 7pm - Pager -  [phone]  After 7pm go to [email or web address] - password [facility]  [facility] -  Office  [phone]

## 2019-03-27 NOTE — Progress Notes (Signed)
Inpatient Diabetes Program Recommendations  AACE/ADA: New Consensus Statement on Inpatient Glycemic Control (2015)  Target Ranges:  Prepandial:   less than 140 mg/dL      Peak postprandial:   less than 180 mg/dL (1-2 hours)      Critically ill patients:  140 - 180 mg/dL   Lab Results  Component Value Date   GLUCAP 266 (H) 03/27/2019   HGBA1C 12.4 (H) 03/11/2019    Review of Glycemic Control  CBGs 176-561 mg/dL On Osmolite TF.   Inpatient Diabetes Program Recommendations:     Increase Lantus to 12 units QD Add Novolog 3 units Q4H for TF coverage  Will continue to follow.  Thank you. Ailene Ards, RD, LDN, CDE Inpatient Diabetes Coordinator [phone]

## 2019-03-27 NOTE — Progress Notes (Signed)
CRITICAL VALUE ALERT  Critical Value:  Glucose 561, follow up CBG 285  Date & Time Notied: 03/27/19 0615   Provider Notified: Craige Cotta, NP  Orders Received/Actions taken: STAT lab redraw

## 2019-03-27 NOTE — Progress Notes (Signed)
Pt confirmed today with this RN and D. Elgergawy that he wants his ex-wife Bronson Ing as primary contact.  Remains primary contact listed in chart.

## 2019-03-27 NOTE — Progress Notes (Signed)
Upon main RN assessment for pt, RN noted that NG tube was halfway dislodged. 2 RN's, including Clinical research associate, entered room to secure NG tube. NG tube advanced to old marked position with no distress noted from patient. O2 saturation maintained at 94% at 2L O2 via [place]. On call NP Bodenheimer paged and made aware, requested for an X-Ray to confirm placement. Will continue to monitor pt. Closely and will carry out any new orders.

## 2019-03-27 NOTE — Progress Notes (Signed)
NTS performed x3 passes down right nare without difficulty. Suctioned for copious amounts of thick yellow/white secretions. Patient tolerated very well and confirms that his breathing feels better. RN at bedside with assist. VS remained stable throughout the entire process.

## 2019-03-27 NOTE — Progress Notes (Signed)
  Speech Language Pathology Treatment: Dysphagia  Patient Details Name: Benjamin Cohen MRN: [id number] DOB: January 11, 1956 Today's Date: 03/27/2019 Time: 2919-1660 SLP Time Calculation (min) (ACUTE ONLY): 31 min  Assessment / Plan / Recommendation Clinical Impression  Today pt clinically with decreased ability to manage secretions compared to yesterday. He is rattling at proximal sternal region concerning for significant aspiration of secretions.  He continues to be dysphonic/aphonic and his cough remains weak and nonproductive.  Please do not lower HOB more than 30* due to concern for aspiration.  Note CXR results - Concern for possible further airway compromise with large bore NG tube in place worsening secretion aspiration.  Anticipate pt's swallow will not recover within a few days and anticipate he will need ongoing tube feeding for at least a week.    Oral care provided to pt using toothbrush with suction and Yankeur's - pt tolerates better with helping hand over hand.  He is sensitive to blue swab - and closes his lips turning his head with cleaning attempts x2.     SLP is concerned pt may have oral candidiasis - most notably on right lingual musculature.  Made RN and MD aware.    Basic yes/no questions asked and pt incorrectly identified SlP as a male and denied this location being hospital.  With familiar questions in his immediate environment, tv on, etc, he answers appropriately, however beyond this level he is not accurate.  Concern for cognitive impairment present.    ONLY recommend small single ice chip with RN ONLY if pt fully awake and not grossly aspirating secretions.  Advised RN to recommendations and messaged MD with secure text.      HPI HPI: Pt was admitted for acute respiratory failure.  + for COVID 19, s/p intubation 4/2-4/11.  PMH:  CVA 5/19 and HTN  Pt had feeding tube placed yesterday for nutrition - today his CXR concerning for right base pna, suspect right pleural effusion  and he is requiring oxygen - was 4 now 2 liters.        SLP Plan  Continue with current plan of care       Recommendations  Medication Administration: Via alternative means                Oral Care Recommendations: Oral care QID Follow up Recommendations: Skilled Nursing facility(snf) SLP Visit Diagnosis: Dysphagia, oropharyngeal phase (R13.12);Aphonia (R49.1) Plan: Continue with current plan of care       GO                Chales Abrahams 03/27/2019, 3:04 PM   Donavan Burnet, MS [facility] SLP Acute Rehab Services Pager [phone] Office [phone]

## 2019-03-27 NOTE — Progress Notes (Addendum)
While giving pt bath, pt suddenly had decreased LOC. Vital signs remained stable. Bilateral lung fields still with rhonchi. Pt suctioned and prompted to cough. Pt able to clear airway. When this RN asked pt if he feels SOB, pt nodded head "yes."  Inhaler given to pt. Pt more alert at this time. On call provider made aware. Will continue to monitor closely.

## 2019-03-27 NOTE — Evaluation (Signed)
Occupational Therapy Evaluation Patient Details Name: Benjamin Cohen MRN: [id number] DOB: 08/22/1956 Today's Date: 03/27/2019    History of Present Illness Pt was admitted for acute respiratory failure.  + for COVID 19, s/p intubation 4/2-4/11.  PMH:  CVA 5/19 and HTN   Clinical Impression   This 63 year old man was admitted for the above.  Pt was very fatiqued and only nodded/shook head. Room was loud because of ventilation and therapists were wearing masks/shields or goggles.  Per chart, he lived alone.  Pt is profoundly weak and needs total A for most ADLs, +1-2 depending on task. Will follow in acute setting with the goals listed below     Follow Up Recommendations  SNF    Equipment Recommendations  (tba further)    Recommendations for Other Services       Precautions / Restrictions Precautions Precautions: Fall Restrictions Weight Bearing Restrictions: No      Mobility Bed Mobility Overal bed mobility: Needs Assistance Bed Mobility: Rolling;Sidelying to Sit Rolling: Max assist;+2 for physical assistance Sidelying to sit: total;+2 for physical assistance       General bed mobility comments: assist for legs and trunk  Transfers Overall transfer level: Needs assistance Equipment used: 2 person hand held assist Transfers: Squat Pivot Transfers     Squat pivot transfers: Max assist;+2 physical assistance          Balance Overall balance assessment: Needs assistance Sitting-balance support: Bilateral upper extremity supported;Feet supported Sitting balance-Leahy Scale: Poor Sitting balance - Comments: mostly min A for balance; mod as he fatiqued.  Pt leaned anteriorly and to L                                   ADL either performed or assessed with clinical judgement   ADL Overall ADL's : Needs assistance/impaired Eating/Feeding: NPO                       Toilet Transfer: Maximal assistance;+2 for physical assistance;Squat-pivot(to  chair)             General ADL Comments: pt needs total A, +2 for LB and transfers at this time.       Vision         Perception     Praxis      Pertinent Vitals/Pain Pain Assessment: Faces Faces Pain Scale: No hurt     Hand Dominance Right   Extremity/Trunk Assessment Upper Extremity Assessment Upper Extremity Assessment: Generalized weakness;RUE deficits/detail;LUE deficits/detail RUE Deficits / Details: both UEs very weak, generally 2+/5.  h/o L weakness after CVA.  Able to grip with bil hands 3-/5 to 3/5           Communication Communication Communication: (very little verbalization)   Cognition Arousal/Alertness: Awake/alert Behavior During Therapy: Flat affect Overall Cognitive Status: Difficult to assess                                 General Comments: followed commands; pt weak   General Comments       Exercises     Shoulder Instructions      Home Living Family/patient expects to be discharged to:: Unsure  Additional Comments: per nursing admission notes, pt lived alone prior to admission      Prior Functioning/Environment                   OT Problem List: Decreased strength;Decreased activity tolerance;Impaired balance (sitting and/or standing);Decreased knowledge of use of DME or AE;Impaired UE functional use      OT Treatment/Interventions: Self-care/ADL training;DME and/or AE instruction;Therapeutic activities;Patient/family education;Balance training;Therapeutic exercise    OT Goals(Current goals can be found in the care plan section) Acute Rehab OT Goals Patient Stated Goal: none stated; agreeable to therapy OT Goal Formulation: Patient unable to participate in goal setting Time For Goal Achievement: 04/10/19 Potential to Achieve Goals: Good ADL Goals Pt Will Perform Grooming: with min assist;sitting;bed level Pt Will Transfer to Toilet: with mod assist;bedside  commode;with +2 assist;squat pivot transfer;stand pivot transfer Additional ADL Goal #1: pt will perform UB adls with mod A seated Additional ADL Goal #2: pt will go from sit to stand with mod +2 assist for adls Additional ADL Goal #3: Pt will tolerate A/AAROM to bil UEs to strengthening arms for adls and toilet transfers  OT Frequency: Min 2X/week   Barriers to D/C:            Co-evaluation PT/OT/SLP Co-Evaluation/Treatment: Yes Reason for Co-Treatment: Complexity of the patient's impairments (multi-system involvement);For patient/therapist safety PT goals addressed during session: Mobility/safety with mobility OT goals addressed during session: Strengthening/ROM      AM-PAC OT "6 Clicks" Daily Activity     Outcome Measure Help from another person eating meals?: Total(NPO) Help from another person taking care of personal grooming?: Total Help from another person toileting, which includes using toliet, bedpan, or urinal?: Total Help from another person bathing (including washing, rinsing, drying)?: Total Help from another person to put on and taking off regular upper body clothing?: Total Help from another person to put on and taking off regular lower body clothing?: Total 6 Click Score: 6   End of Session    Activity Tolerance: Patient limited by fatigue Patient left: in chair;with call bell/phone within reach;with chair alarm set(lift pad in chair)  OT Visit Diagnosis: Muscle weakness (generalized) (M62.81)                Time: [id number] OT Time Calculation (min): 39 min Charges:  OT General Charges $OT Visit: 1 Visit OT Evaluation $OT Eval Low Complexity: 1 Low  [name], OTR/L Acute Rehabilitation Services [phone] [facility] pager [phone] office 03/27/2019  [name] 03/27/2019, 11:53 AM

## 2019-03-27 NOTE — Progress Notes (Signed)
CRITICAL VALUE ALERT  Critical Value:  K 6.9  Date & Time Notied:  03/27/19 7902   Provider Notified: Craige Cotta, NP  Orders Received/Actions taken: STAT lab redraw

## 2019-03-28 ENCOUNTER — Inpatient Hospital Stay (HOSPITAL_COMMUNITY): Payer: 59

## 2019-03-28 DIAGNOSIS — J988 Other specified respiratory disorders: Secondary | ICD-10-CM

## 2019-03-28 DIAGNOSIS — R1313 Dysphagia, pharyngeal phase: Secondary | ICD-10-CM

## 2019-03-28 DIAGNOSIS — R1310 Dysphagia, unspecified: Secondary | ICD-10-CM

## 2019-03-28 LAB — CBC
HCT: 41.3 % (ref 39.0–52.0)
Hemoglobin: 12.3 g/dL — ABNORMAL LOW (ref 13.0–17.0)
MCH: 27 pg (ref 26.0–34.0)
MCHC: 29.8 g/dL — ABNORMAL LOW (ref 30.0–36.0)
MCV: 90.8 fL (ref 80.0–100.0)
Platelets: 164 10*3/uL (ref 150–400)
RBC: 4.55 MIL/uL (ref 4.22–5.81)
RDW: 13.9 % (ref 11.5–15.5)
WBC: 7 10*3/uL (ref 4.0–10.5)
nRBC: 0 % (ref 0.0–0.2)

## 2019-03-28 LAB — COMPREHENSIVE METABOLIC PANEL
ALT: 26 U/L (ref 0–44)
AST: 16 U/L (ref 15–41)
Albumin: 3.2 g/dL — ABNORMAL LOW (ref 3.5–5.0)
Alkaline Phosphatase: 99 U/L (ref 38–126)
Anion gap: 8 (ref 5–15)
BUN: 32 mg/dL — ABNORMAL HIGH (ref 8–23)
CO2: 24 mmol/L (ref 22–32)
Calcium: 8.6 mg/dL — ABNORMAL LOW (ref 8.9–10.3)
Chloride: 117 mmol/L — ABNORMAL HIGH (ref 98–111)
Creatinine, Ser: 1.14 mg/dL (ref 0.61–1.24)
GFR calc Af Amer: >60 mL/min (ref >60)
GFR calc non Af Amer: >60 mL/min (ref >60)
Glucose, Bld: 362 mg/dL — ABNORMAL HIGH (ref 70–99)
Potassium: 3.7 mmol/L (ref 3.5–5.1)
Sodium: 149 mmol/L — ABNORMAL HIGH (ref 135–145)
Total Bilirubin: 0.8 mg/dL (ref 0.3–1.2)
Total Protein: 7.2 g/dL (ref 6.5–8.1)

## 2019-03-28 LAB — GLUCOSE, CAPILLARY
Glucose-Capillary: 373 mg/dL — ABNORMAL HIGH (ref 70–99)
Glucose-Capillary: 413 mg/dL — ABNORMAL HIGH (ref 70–99)
Glucose-Capillary: 217 mg/dL — ABNORMAL HIGH (ref 70–99)
Glucose-Capillary: 304 mg/dL — ABNORMAL HIGH (ref 70–99)
Glucose-Capillary: 311 mg/dL — ABNORMAL HIGH (ref 70–99)

## 2019-03-28 LAB — BLOOD GAS, ARTERIAL
Acid-Base Excess: 1.2 mmol/L (ref 0.0–2.0)
Bicarbonate: 24 mmol/L (ref 20.0–28.0)
Drawn by: 257881
FIO2: 100
MECHVT: 530 mL
O2 Saturation: 95.9 %
PEEP: 5 cmH2O
Patient temperature: 98.6
RATE: 24 resp/min
pCO2 arterial: 33.7 mmHg (ref 32.0–48.0)
pH, Arterial: 7.465 — ABNORMAL HIGH (ref 7.350–7.450)
pO2, Arterial: 86.2 mmHg (ref 83.0–108.0)

## 2019-03-28 LAB — MAGNESIUM: Magnesium: 2.4 mg/dL (ref 1.7–2.4)

## 2019-03-28 MED ORDER — HYDROMORPHONE HCL 1 MG/ML IJ SOLN
0.5000 mg | INTRAMUSCULAR | Status: DC | PRN
  Filled 2019-03-28 (×3): qty 0.5

## 2019-03-28 MED ORDER — DOCUSATE SODIUM 50 MG/5ML PO LIQD: 100.0000 mg | Freq: Two times a day (BID) | ORAL | Status: DC | PRN

## 2019-03-28 MED ORDER — ORAL CARE MOUTH RINSE: 15.0000 mL | Freq: Two times a day (BID) | OROMUCOSAL | Status: DC

## 2019-03-28 MED ORDER — FUROSEMIDE 10 MG/ML IJ SOLN
60.0000 mg | Freq: Two times a day (BID) | INTRAMUSCULAR | Status: DC
  Administered 2019-03-28 (×2): 60 mg via INTRAVENOUS
  Filled 2019-03-28 (×2): qty 6

## 2019-03-28 MED ORDER — FUROSEMIDE 10 MG/ML IJ SOLN: 40.0000 mg | Freq: Two times a day (BID) | INTRAMUSCULAR | Status: DC

## 2019-03-28 MED ORDER — INSULIN ASPART 100 UNIT/ML ~~LOC~~ SOLN
3.0000 [IU] | SUBCUTANEOUS | Status: DC
  Administered 2019-03-28 – 2019-03-31 (×18): 3 [IU] via SUBCUTANEOUS

## 2019-03-28 MED ORDER — MIDAZOLAM HCL 2 MG/2ML IJ SOLN
2.0000 mg | Freq: Once | INTRAMUSCULAR | Status: AC
  Administered 2019-03-28: 2 mg via INTRAVENOUS

## 2019-03-28 MED ORDER — SODIUM CHLORIDE 0.9 % IV SOLN
3.0000 g | Freq: Four times a day (QID) | INTRAVENOUS | Status: DC
  Administered 2019-03-28: 3 g via INTRAVENOUS
  Filled 2019-03-28 (×3): qty 3

## 2019-03-28 MED ORDER — INSULIN GLARGINE 100 UNIT/ML ~~LOC~~ SOLN
20.0000 [IU] | Freq: Every day | SUBCUTANEOUS | Status: DC
  Administered 2019-03-28 – 2019-03-31 (×4): 20 [IU] via SUBCUTANEOUS
  Filled 2019-03-28 (×5): qty 0.2

## 2019-03-28 MED ORDER — MIDAZOLAM HCL 2 MG/2ML IJ SOLN
1.0000 mg | INTRAMUSCULAR | Status: DC | PRN
  Administered 2019-03-29 – 2019-03-30 (×2): 1 mg via INTRAVENOUS
  Filled 2019-03-28 (×5): qty 2

## 2019-03-28 MED ORDER — ORAL CARE MOUTH RINSE
15.0000 mL | OROMUCOSAL | Status: DC
  Administered 2019-03-28 – 2019-03-31 (×27): 15 mL via OROMUCOSAL

## 2019-03-28 MED ORDER — CHLORHEXIDINE GLUCONATE 0.12 % MT SOLN: 15.0000 mL | Freq: Two times a day (BID) | OROMUCOSAL | Status: DC

## 2019-03-28 MED ORDER — FREE WATER
300.0000 mL | Freq: Four times a day (QID) | Status: DC
  Administered 2019-03-28 – 2019-03-31 (×11): 300 mL

## 2019-03-28 MED ORDER — FENTANYL CITRATE (PF) 100 MCG/2ML IJ SOLN
INTRAMUSCULAR | Status: AC
  Filled 2019-03-28: qty 2

## 2019-03-28 MED ORDER — VANCOMYCIN HCL 10 G IV SOLR: 1500.0000 mg | INTRAVENOUS | Status: DC

## 2019-03-28 MED ORDER — MIDAZOLAM HCL 2 MG/2ML IJ SOLN
INTRAMUSCULAR | Status: AC
  Administered 2019-03-28: 2 mg
  Filled 2019-03-28: qty 2

## 2019-03-28 MED ORDER — HYDROMORPHONE HCL 1 MG/ML IJ SOLN
0.5000 mg | INTRAMUSCULAR | Status: DC | PRN
  Administered 2019-03-29 – 2019-03-30 (×9): 1 mg via INTRAVENOUS
  Administered 2019-03-31 (×2): 2 mg via INTRAVENOUS
  Filled 2019-03-28 (×4): qty 1
  Filled 2019-03-28: qty 2
  Filled 2019-03-28 (×7): qty 1

## 2019-03-28 MED ORDER — MIDAZOLAM HCL 2 MG/2ML IJ SOLN
1.0000 mg | INTRAMUSCULAR | Status: DC | PRN
  Administered 2019-03-28 – 2019-03-30 (×6): 1 mg via INTRAVENOUS
  Filled 2019-03-28 (×3): qty 2

## 2019-03-28 MED ORDER — ETOMIDATE 2 MG/ML IV SOLN
40.0000 mg | Freq: Once | INTRAVENOUS | Status: AC
  Administered 2019-03-28: 40 mg via INTRAVENOUS

## 2019-03-28 MED ORDER — ROCURONIUM BROMIDE 50 MG/5ML IV SOLN
60.0000 mg | Freq: Once | INTRAVENOUS | Status: AC
  Administered 2019-03-28: 60 mg via INTRAVENOUS
  Filled 2019-03-28: qty 6

## 2019-03-28 MED ORDER — VANCOMYCIN HCL 10 G IV SOLR
1750.0000 mg | Freq: Once | INTRAVENOUS | Status: AC
  Administered 2019-03-28: 1750 mg via INTRAVENOUS
  Filled 2019-03-28: qty 1750

## 2019-03-28 MED ORDER — OXYCODONE HCL 5 MG/5ML PO SOLN: 5.0000 mg | Freq: Four times a day (QID) | ORAL | Status: DC | PRN

## 2019-03-28 MED ORDER — INSULIN ASPART 100 UNIT/ML ~~LOC~~ SOLN
0.0000 [IU] | SUBCUTANEOUS | Status: DC
  Administered 2019-03-28: 11 [IU] via SUBCUTANEOUS
  Administered 2019-03-28: 15 [IU] via SUBCUTANEOUS
  Administered 2019-03-28: 5 [IU] via SUBCUTANEOUS
  Administered 2019-03-28: 20:00:00 11 [IU] via SUBCUTANEOUS
  Administered 2019-03-29 (×2): 3 [IU] via SUBCUTANEOUS
  Administered 2019-03-29: 2 [IU] via SUBCUTANEOUS
  Administered 2019-03-29: 08:00:00 3 [IU] via SUBCUTANEOUS
  Administered 2019-03-29: 5 [IU] via SUBCUTANEOUS
  Administered 2019-03-29: 8 [IU] via SUBCUTANEOUS
  Administered 2019-03-30: 04:00:00 3 [IU] via SUBCUTANEOUS
  Administered 2019-03-30 (×4): 5 [IU] via SUBCUTANEOUS
  Administered 2019-03-31 (×2): 3 [IU] via SUBCUTANEOUS
  Administered 2019-03-31: 5 [IU] via SUBCUTANEOUS
  Administered 2019-03-31: 2 [IU] via SUBCUTANEOUS
  Administered 2019-03-31: 3 [IU] via SUBCUTANEOUS
  Administered 2019-03-31 – 2019-04-01 (×2): 2 [IU] via SUBCUTANEOUS
  Administered 2019-04-01 – 2019-04-02 (×2): 3 [IU] via SUBCUTANEOUS
  Administered 2019-04-02: 2 [IU] via SUBCUTANEOUS
  Administered 2019-04-02: 3 [IU] via SUBCUTANEOUS
  Administered 2019-04-03: 2 [IU] via SUBCUTANEOUS
  Administered 2019-04-03: 3 [IU] via SUBCUTANEOUS
  Administered 2019-04-03 (×2): 2 [IU] via SUBCUTANEOUS
  Administered 2019-04-04: 3 [IU] via SUBCUTANEOUS
  Administered 2019-04-04: 2 [IU] via SUBCUTANEOUS
  Administered 2019-04-04: 16:00:00 3 [IU] via SUBCUTANEOUS
  Administered 2019-04-04 – 2019-04-05 (×2): 2 [IU] via SUBCUTANEOUS
  Administered 2019-04-05 (×5): 3 [IU] via SUBCUTANEOUS
  Administered 2019-04-06 (×2): 2 [IU] via SUBCUTANEOUS
  Administered 2019-04-06 – 2019-04-07 (×7): 3 [IU] via SUBCUTANEOUS
  Administered 2019-04-07: 2 [IU] via SUBCUTANEOUS
  Administered 2019-04-07: 3 [IU] via SUBCUTANEOUS
  Administered 2019-04-08: 2 [IU] via SUBCUTANEOUS
  Administered 2019-04-08 (×2): 3 [IU] via SUBCUTANEOUS
  Administered 2019-04-09 – 2019-04-10 (×7): 2 [IU] via SUBCUTANEOUS
  Administered 2019-04-10: 3 [IU] via SUBCUTANEOUS

## 2019-03-28 MED ORDER — SODIUM CHLORIDE 0.9 % IV SOLN
2.0000 g | Freq: Three times a day (TID) | INTRAVENOUS | Status: DC
  Administered 2019-03-28 – 2019-03-29 (×3): 2 g via INTRAVENOUS
  Filled 2019-03-28 (×3): qty 2

## 2019-03-28 MED ORDER — FLUCONAZOLE 100MG IVPB
100.0000 mg | INTRAVENOUS | Status: DC
  Administered 2019-03-28 – 2019-03-30 (×3): 100 mg via INTRAVENOUS
  Filled 2019-03-28 (×5): qty 50

## 2019-03-28 MED ORDER — BISACODYL 10 MG RE SUPP: 10.0000 mg | Freq: Every day | RECTAL | Status: DC | PRN

## 2019-03-28 MED ORDER — FENTANYL CITRATE (PF) 100 MCG/2ML IJ SOLN
100.0000 ug | Freq: Once | INTRAMUSCULAR | Status: AC
  Administered 2019-03-28: 100 ug via INTRAVENOUS

## 2019-03-28 NOTE — Progress Notes (Addendum)
NAME:  Benjamin Cohen, MRN:  [id number], DOB:  05-Aug-1956, LOS: 17 ADMISSION DATE:  03/11/2019, CONSULTATION DATE:  03/12/19 REFERRING MD:  Dr. Rito Ehrlich, CHIEF COMPLAINT:  SOB   Brief History   63 y/o M admitted 3/31 with reports of fever, cough, SOB and confusion.  He was recently at a wedding mid March and after multiple family members were diagnosed with COVID including his wife.  Developed worsening hypoxemia, respiratory distress 4/1 and PCCM consulted for evaluation.  Intubated 4/2 with ARDS.   Past Medical History  HTN, CVA - 2014 and 04/2018  Significant Hospital Events   3/31 Admit, 2L, bilateral patchy infiltrates  4/01 PCCM consulted 4/02 Intubated 4/03 toclizamab x 1 4/05 Net positive, remains on paralytic, still prone 4/06 Supine, plateau pressure 23, 50% / 12 PEEP; complete 5 days of plaquenil 4/08 Plateau 23, PEEP 12 / 40% 4/14 transfer to floor bed 4/17 back to ICU, intubated.  Consults:    Procedures:  ETT 4/2 >> 4/12 LUE PICC 4/2 >>    Significant Diagnostic Tests:  LDH 3/31 360  Micro Data:  BCx2 3/31 >> granulicatella adiacens 1/2  COVID 3/31 >> positive  RVP 4/1 >> negative Trach asp 4/2 >> oral flora COVID trach asp 4/2 >> Positive  Quantiferon Gold 4/6 >> negative Tracheal aspirate 4/17 >  Antimicrobials:  Azithro 3/31 >> 4/4 Rocephin 3/31 >> 4/4 Unasyn 4/17 Cefepime 4/17 > Vanco 4/17 >  Interim history/subjective:  Extubated 4/12, tolerated well. Continued to struggle with dysphagia and was fed via NGT. O2 weaned down to 2L in days since extubation, but now 4/17 he has developed worsening hypoxemia requiring 15L HFNC. Sats still remain in the 80s. He was transferred to ICU and PCCM has been asked to evaluate for further management.     Objective   Blood pressure 115/74, pulse (!) 115, temperature 99 F (37.2 C), temperature source Oral, resp. rate (!) 22, height 5\' 7"  (1.702 m), weight 83.4 kg, SpO2 94 %.        Intake/Output Summary  (Last 24 hours) at 03/28/2019 1306 Last data filed at 03/28/2019 0800 Gross per 24 hour  Intake 3241.55 ml  Output 125 ml  Net 3116.55 ml   Filed Weights   03/26/19 0411 03/27/19 0500 03/28/19 0534  Weight: 83.2 kg 82.7 kg 83.4 kg   Physical Exam: Done by Dr. Delton Coombes General:  Frail middle aged male  Neuro:  Lethargic, poorly responsive, intermittently follows simple commands.  HEENT:  [place]/AT, No JVD noted, PERRL Cardiovascular: Borderline tachy, regular, no MRG Lungs:  Distant, poor effort. Upper airway rattle.  Abdomen:  Soft, non-distended, non-tender Musculoskeletal:  No acute deformity  Skin:  Intact, MMM  Resolved Hospital Problem list   Hypotension, Respiratory Alkalosis, ARDS, Acute metabolic encephalopathy, Acute hypoxic respiratory failure, COVID PNA  Assessment & Plan:   Acute hypoxemic respiratory failure: Has recovered from ARDS related to COVID-19. Now back in ICU with progressive hypoxic failure. Differential includes aspiration (favored), pulmonary edema, and deconditioning related atelectasis. - Supplemental O2 via HFNC with SpO2 goal 90-95% - Pulmonary hygiene  - Incentive spirometry if he is able - NTS PRN - Low threshold for intubation.  - Broaden unasyn to cover HCAP  Acute encephalopathy Ex-wife makes it clear this encephalopathy is acute. Not well understood why he would have this level of mental status depression.  - Supportive care - Close monitoring in ICU - Avoid sedating medications.  - Perhaps need imaging of the brain should he stabilize.  Hypertension.  Hx of CVA, HLD. Plan: - holding plavix - On no home antihypertensives.   DM II with current hyperglycemia.  Plan - SSI with lantus 10 units daily - Holding glimepiride, metformin.  - Per primary  Hypernatremia from diuresis. Plan - per primary  Dysphagia. Plan - TF via NGT. Has failed SLP eval.   Deconditioning. Plan - PT/OT  Best practice:  Diet: NPO DVT prophylaxis: lovenox   GI prophylaxis: n/a  Mobility: as tolerated  Code Status: Full Code Disposition: ICU  Labs    CMP Latest Ref Rng & Units 03/28/2019 03/27/2019 03/27/2019  Glucose 70 - 99 mg/dL 161(W362(H) 960(A317(H) 540(JW561(HH)  BUN 8 - 23 mg/dL 11(B32(H) 14(N40(H) 82(N39(H)  Creatinine 0.61 - 1.24 mg/dL 5.621.14 1.30(Q1.40(H) 6.57(Q1.31(H)  Sodium 135 - 145 mmol/L 149(H) 153(H) 145  Potassium 3.5 - 5.1 mmol/L 3.7 3.9 6.9(HH)  Chloride 98 - 111 mmol/L 117(H) 121(H) 117(H)  CO2 22 - 32 mmol/L 24 24 23   Calcium 8.9 - 10.3 mg/dL 4.6(N8.6(L) 6.2(X8.6(L) 5.2(W8.2(L)  Total Protein 6.5 - 8.1 g/dL 7.2 - -  Total Bilirubin 0.3 - 1.2 mg/dL 0.8 - -  Alkaline Phos 38 - 126 U/L 99 - -  AST 15 - 41 U/L 16 - -  ALT 0 - 44 U/L 26 - -   CBC Latest Ref Rng & Units 03/28/2019 03/27/2019 03/26/2019  WBC 4.0 - 10.5 K/uL 7.0 6.6 6.1  Hemoglobin 13.0 - 17.0 g/dL 12.3(L) 12.2(L) 13.8  Hematocrit 39.0 - 52.0 % 41.3 43.2 46.7  Platelets 150 - 400 K/uL 164 220 258   CBG (last 3)  Recent Labs    03/27/19 2112 03/28/19 0821 03/28/19 1105  GLUCAP 251* 373* 413*     Joneen RoachPaul Sheilia Reznick, AGACNP-BC [facility] Pager [phone] or [phone]  03/28/2019 2:17 PM

## 2019-03-28 NOTE — Progress Notes (Signed)
OT Cancellation Note  Patient Details Name: Benjamin Cohen MRN: [id number] DOB: 1956/12/11   Cancelled Treatment:    Reason Eval/Treat Not Completed: Medical issues which prohibited therapy.  Decrease in status: possible transfer to ICU. Will check back another day.  Apphia Cropley 03/28/2019, 9:20 AM  Marica Otter, OTR/L Acute Rehabilitation Services [phone] [facility] pager [phone] office 03/28/2019

## 2019-03-28 NOTE — Progress Notes (Addendum)
PROGRESS NOTE                                                                                                                                                                                                             Patient Demographics:    Benjamin Cohen, is a 63 y.o. male, DOB - Dec 08, 1956, MQK:[id number]  Admit date - 03/11/2019   Admitting Physician Benjamin Bongo, MD  Outpatient Primary MD for the patient is Benjamin Cohen  LOS - 88   Chief Complaint  Patient presents with  . Altered Mental Status       Brief Narrative   62 y/o M admitted 3/31 with reports of fever, cough, SOB and confusion.  He was recently at a wedding mid March and after multiple family members were diagnosed with COVID including his wife.  Developed worsening hypoxemia, respiratory distress 4/1 and PCCM consulted for evaluation.  Intubated 4/2 with ARDS.  Was extubated 03/23/2019, usually with no oxygen requirement, currently back on 4 L nasal cannula, secondary to aspiration pneumonia on his oral secretions.  3/31 Admit, 2L, bilateral patchy infiltrates  4/01 PCCM consulted 4/02 Intubated 4/03 toclizamab x 1 4/05 Net positive, remains on paralytic, still prone 4/06 Supine, plateau pressure 23, 50% / 12 PEEP; complete 5 days of plaquenil 4/08 Plateau 23, PEEP 12 / 40% 4/14 transfer to floor bed 4/17 transfer to stepdown    Subjective:    Benjamin Cohen with worsening respiratory status overnight,  rapid response was called, increased oxygen requirement, chest x-ray significant for aspiration pneumonia, had copious oral secretion..   Assessment  & Plan :    Principal Problem:   Acute respiratory failure with hypoxia (HCC) Active Problems:   Diabetes (Benjamin Cohen)   HLD (hyperlipidemia)   CVA (cerebral vascular accident) (Benjamin Cohen)   Pressure injury of skin  Acute hypoxic respiratory failure/ARDS secondary to COVID-19 -Patient required intubation, he was  successfully extubated for 11/30/2019 -He received Actemra -He was treated with Plaquenil x5 days -And was on room air once extubated, but he does have very poor inspiratory effort, extremely weak and debilitated, and apparently aspirating on his oral secretions, now he is requiring oxygen, back on 7 L nasal cannula, transferred back to stepdown. -Continue with chest PT, will require frequent NTS, had copious amount suctioned yesterday. -Continue with strict n.p.o.'s, head of bed  elevated all times more than 45 degrees, continue with oral hygiene and frequent suctioning. -He was started back on diuresis, monitor closely secondary to hypernatremia(for which patient is on D5W)  Aspiration  Pneumonia -Patient aspirating his own secretions, continue with aspiration precaution, head of bed elevated, frequent NTS, started on Unasyn.  Hypertension which patient is on D5W -Blood pressure is elevated, continue with Norvasc, continue with PRN hydralazine  Hyperlipidemia -Resume statin once stable  Hx of CVA -Continue with statin and Plavix  Diabetes mellitus type II -CBG significantly elevated today, this is most likely due to acute illness, but as well she is on D5W in the setting of hypernatremia( I wil decrease 50 cc/h today), I will increase her Lantus to 20 units subcu daily, and change her sliding scale to moderate every 4 hours   Hypernatremia -Volume depletion, on diuresis, proving with D5W, I have increased his free water via PEG as well 300 cc every 6 hours, , monitor closely as on IV diuresis .  AKI -Resolved, continue to monitor closely  Dysphagia. -Discussed with SLP, patient is extremely frail, ready for oral intake yet, will plan to continue with feeding through NGT -Continue with aspiration precaution, keep head of bed elevated at all time  Oral Candidiasis -Continue with IV Diflucan  Deconditioning. - PT/OT, extremely weak and debilitated   I have discussed at length  with his wife Benjamin Cohen ready guarded prognosis, in the setting of extremely deconditioned male with poor inspiratory effort and high risk for aspiration, she confirmed full CODE STATUS, and to proceed with reintubation if indicated.   Code Status : Full Code,  was reconfirmed by Benjamin Cohen again today 4/17  Family Communication  : Discussed with ex wife Benjamin Cohen is the contact person) Disposition Plan  : Transferred back to stepdown unit given worsening respiratory status.  Consults  :  PCCM  Procedures  :  ETT 4/2 >> 4/12 LUE PICC 4/2 >>   DVT Prophylaxis  : Subcu Lovenox  Lab Results  Component Value Date   PLT 164 03/28/2019    Antibiotics  :    Anti-infectives (From admission, onward)   Start     Dose/Rate Route Frequency Ordered Stop   03/28/19 1000  Ampicillin-Sulbactam (UNASYN) 3 g in sodium chloride 0.9 % 100 mL IVPB     3 g 200 mL/hr over 30 Minutes Intravenous Every 6 hours 03/28/19 0833     03/28/19 0800  fluconazole (DIFLUCAN) IVPB 100 mg     100 mg 50 mL/hr over 60 Minutes Intravenous Every 24 hours 03/28/19 0720     03/14/19 1000  hydroxychloroquine (PLAQUENIL) tablet 200 mg     200 mg Per Tube 2 times daily 03/14/19 0759 03/17/19 0923   03/13/19 2200  hydroxychloroquine (PLAQUENIL) tablet 200 mg  Status:  Discontinued     200 mg Oral 2 times daily 03/12/19 1615 03/14/19 0759   03/13/19 1000  hydroxychloroquine (PLAQUENIL) tablet 400 mg     400 mg Oral  Once 03/12/19 1615 03/13/19 1129   03/12/19 1700  hydroxychloroquine (PLAQUENIL) tablet 400 mg     400 mg Oral  Once 03/12/19 1615 03/12/19 1704   03/11/19 1215  cefTRIAXone (ROCEPHIN) 2 g in sodium chloride 0.9 % 100 mL IVPB     2 g 200 mL/hr over 30 Minutes Intravenous Every 24 hours 03/11/19 1210 03/15/19 1220   03/11/19 1215  azithromycin (ZITHROMAX) 500 mg in sodium chloride 0.9 % 250 mL IVPB  500 mg 250 mL/hr over 60 Minutes Intravenous Every 24 hours 03/11/19 1210 03/15/19 1321         Objective:   Vitals:   03/27/19 2104 03/27/19 2110 03/27/19 2115 03/28/19 0534  BP: 135/89   138/87  Pulse:    98  Resp: (!) 23   (!) 22  Temp: 98.5 F (36.9 C)   98.9 F (37.2 C)  TempSrc: Oral   Oral  SpO2: 90% (!) 86% 93% 94%  Weight:    83.4 kg  Height:        Wt Readings from Last 3 Encounters:  03/28/19 83.4 kg  05/27/18 95.3 kg  04/30/18 95.7 kg     Intake/Output Summary (Last 24 hours) at 03/28/2019 1129 Last data filed at 03/28/2019 0800 Gross per 24 hour  Intake 3241.55 ml  Output 125 ml  Net 3116.55 ml     Physical Exam  Patient is awake, left interactive and responsive today, still follow simple commands, distracted Poor inspiratory effort, with diminished air entry at the bases,no wheezing Regular rate and rhythm, no rubs or gallops Abdomen soft, nontender, bowel sounds present Remedies with no edema, clubbing or cyanosis.   Data Review:    CBC Recent Labs  Lab 03/22/19 0500 03/25/19 0511 03/26/19 0823 03/27/19 0500 03/28/19 0613  WBC 5.4 7.1 6.1 6.6 7.0  HGB 11.6* 13.6 13.8 12.2* 12.3*  HCT 38.5* 47.4 46.7 43.2 41.3  PLT 301 284 258 220 164  MCV 89.5 91.2 91.4 92.1 90.8  MCH 27.0 26.2 27.0 26.0 27.0  MCHC 30.1 28.7* 29.6* 28.2* 29.8*  RDW 13.7 14.0 14.3 14.3 13.9    Chemistries  Recent Labs  Lab 03/25/19 0511 03/26/19 0823 03/27/19 0500 03/27/19 0648 03/28/19 0613  NA 154* 156* 145 153* 149*  K 3.4* 3.2* 6.9* 3.9 3.7  CL 119* 123* 117* 121* 117*  CO2 _0 GLUCOSE 197* 189* 561* 317* 362*  BUN 43* 42* 39* 40* 32*  CREATININE 1.04 1.17 1.31* 1.40* 1.14  CALCIUM 9.3 9.3 8.2* 8.6* 8.6*  MG 2.9* 3.0*  --   --  2.4  AST  --  26  --   --  16  ALT  --  38  --   --  26  ALKPHOS  --  99  --   --  99  BILITOT  --  1.4*  --   --  0.8   ------------------------------------------------------------------------------------------------------------------ No results for input(s): CHOL, HDL, LDLCALC, TRIG, CHOLHDL, LDLDIRECT in  the last 72 hours.  Lab Results  Component Value Date   HGBA1C 12.4 (H) 03/11/2019   ------------------------------------------------------------------------------------------------------------------ No results for input(s): TSH, T4TOTAL, T3FREE, THYROIDAB in the last 72 hours.  Invalid input(s): FREET3 ------------------------------------------------------------------------------------------------------------------ No results for input(s): VITAMINB12, FOLATE, FERRITIN, TIBC, IRON, RETICCTPCT in the last 72 hours.  Coagulation profile No results for input(s): INR, PROTIME in the last 168 hours.  No results for input(s): DDIMER in the last 72 hours.  Cardiac Enzymes No results for input(s): CKMB, TROPONINI, MYOGLOBIN in the last 168 hours.  Invalid input(s): CK ------------------------------------------------------------------------------------------------------------------ No results found for: BNP  Inpatient Medications  Scheduled Meds: . amLODipine  5 mg Per Tube QAC breakfast  . atorvastatin  40 mg Per Tube q1800  . chlorhexidine gluconate (MEDLINE KIT)  15 mL Mouth Rinse BID  . clopidogrel  75 mg Per Tube QAC breakfast  . enoxaparin (LOVENOX) injection  40 mg Subcutaneous Q24H  . feeding supplement (PRO-STAT SUGAR FREE 64)  60 mL Per Tube Q24H  . free water  300 mL Per Tube Q8H  . furosemide  60 mg Intravenous BID  . insulin aspart  0-15 Units Subcutaneous TID AC & HS  . insulin glargine  20 Units Subcutaneous Daily  . nutrition supplement (JUVEN)  1 packet Per Tube BID BM  . sodium chloride flush  10-40 mL Intracatheter Q12H  . sodium chloride flush  3 mL Intravenous Q12H   Continuous Infusions: . ampicillin-sulbactam (UNASYN) IV    . dextrose 5 % with kcl 75 mL/hr at 03/28/19 1039  . feeding supplement (OSMOLITE 1.5 CAL) 1,000 mL (03/28/19 0546)  . fluconazole (DIFLUCAN) IV     PRN Meds:.acetaminophen (TYLENOL) oral liquid 160 mg/5 mL **OR** acetaminophen,  bisacodyl, hydrALAZINE, ipratropium, labetalol, LORazepam, metoprolol tartrate, [DISCONTINUED] ondansetron **OR** ondansetron (ZOFRAN) IV, oxyCODONE, polyethylene glycol, sodium chloride flush  Micro Results No results found for this or any previous visit (from the past 240 hour(s)).  Radiology Reports Dg Chest 1 View  Result Date: 03/23/2019 CLINICAL DATA:  Initial evaluation for COVID-19 infection EXAM: CHEST  1 VIEW COMPARISON:  Prior radiograph from 03/21/2019 FINDINGS: Patient is intubated with the tip of the endotracheal tube positioned 2.4 cm above the carina. Left-sided PICC catheter in place with tip overlying the distal SVC. Enteric tube courses into the abdomen. Cardiac and mediastinal silhouette stable, and remain within normal limits. Lungs mildly hypoinflated. Scattered patchy multifocal bilateral airspace opacities persist, but are overall improved from previous exam. Underlying mild pulmonary interstitial congestion without overt pulmonary edema. No pleural effusion. No pneumothorax. Osseous structures unchanged. IMPRESSION: 1. Tip of the endotracheal tube positioned 2.4 cm above the carina. Remaining support apparatus in satisfactory position. 2. Persistent but improved multifocal bilateral airspace opacities with improved aeration of both lungs. Electronically Signed   By: Jeannine Boga M.D.   On: 03/23/2019 05:46   Dg Chest Port 1 View  Result Date: 03/27/2019 CLINICAL DATA:  Positive COVID-19, respiratory distress EXAM: PORTABLE CHEST 1 VIEW COMPARISON:  03/27/2019 FINDINGS: Cardiac shadows within normal limits. Left PICC line and nasogastric catheter are noted and stable. Right lung is clear. Left basilar infiltrate is noted. No bony abnormality is noted. IMPRESSION: Persistent left basilar infiltrate Electronically Signed   By: Inez Catalina M.D.   On: 03/27/2019 22:48   Dg Chest Port 1 View  Result Date: 03/27/2019 CLINICAL DATA:  Hypoxia EXAM: PORTABLE CHEST 1 VIEW  COMPARISON:  March 23, 2019 FINDINGS: Central catheter tip is at the cavoatrial junction. Nasogastric tube tip and side port are below the diaphragm. No pneumothorax. There is a small right pleural effusion with patchy airspace opacity in the right base. There is atelectatic change in the left base. Left lung otherwise clear. Heart is upper normal in size with pulmonary vascularity normal. No adenopathy. No bone lesions. IMPRESSION: Tube and catheter positions as described without pneumothorax. Small right pleural effusion with suspected superimposed right base pneumonia. Left base atelectasis noted. Heart upper normal in size. Electronically Signed   By: Lowella Grip III M.D.   On: 03/27/2019 09:57   Dg Chest Port 1 View  Result Date: 03/21/2019 CLINICAL DATA:  ARDS secondary to COVID-19. EXAM: PORTABLE CHEST 1 VIEW COMPARISON:  03/18/2019 FINDINGS: Endotracheal tube tip is 3.5 cm above the carina. Orogastric or nasogastric tube enters the stomach. Left arm PICC tip is in the SVC at the SVC RA junction. Widespread patchy bilateral pulmonary infiltrates persist, improved on the right at somewhat worsened in the left lower  lobe. IMPRESSION: Lines and tubes satisfactory. Improvement in pulmonary infiltrate on the right. Some worsening of infiltrate and/or atelectasis in the left lower lobe. Electronically Signed   By: Nelson Chimes M.D.   On: 03/21/2019 05:07   Dg Chest Port 1 View  Result Date: 03/18/2019 CLINICAL DATA:  Hypoxia EXAM: PORTABLE CHEST 1 VIEW COMPARISON:  Three days ago FINDINGS: Endotracheal tube tip at the clavicular heads. PICC with tip at the right atrium. The orogastric tube at least reaches the stomach. Bilateral airspace disease in the setting of ARDS/viral pneumonia. No convincing change from yesterday when allowing for differences in rotation. No effusion or pneumothorax. Normal heart size IMPRESSION: Stable hardware positioning and bilateral airspace disease. Electronically Signed    By: Monte Fantasia M.D.   On: 03/18/2019 06:17   Dg Chest Port 1 View  Result Date: 03/15/2019 CLINICAL DATA:  Acute respiratory failure. Intubated patient. Positive for COVID-19. Follow-up exam. EXAM: PORTABLE CHEST 1 VIEW COMPARISON:  03/13/2019 and older exams. FINDINGS: Bilateral airspace opacities have progressed since the most recent prior exam, but are more similar to the exam from 03/13/2019 at 4:04 a.m. Increased consolidations most evident in the lung bases, as well as in the inferior right upper lobe. No pneumothorax. Endotracheal tube, nasogastric tube and left PICC are stable. IMPRESSION: 1. Lung aeration appears worsened compared to the most recent prior exam, 03/13/2019 at 9:39 a.m., but similar to the pre intubation exam from 03/13/2019 at 4:04 a.m. Some of this apparent change is likely technical due to larger lung volumes on the most recent prior study. However, lung opacities appear convincingly increased at the bases compared to the prior studies, some of which is likely atelectasis. 2. No other change.  Stable support apparatus. Electronically Signed   By: Lajean Manes M.D.   On: 03/15/2019 05:31   Dg Chest Port 1 View  Result Date: 03/13/2019 CLINICAL DATA:  63 year old with confirmed diagnosis of COVID-19. Acute respiratory distress requiring intubation. EXAM: PORTABLE CHEST 1 VIEW 9:39 a.m.: COMPARISON:  Chest x-ray earlier same day 4:04 a.m. and previously. FINDINGS: Endotracheal tube tip in satisfactory position projecting approximately 5 cm above the carina. Gastric tube tip in the distal stomach. Improved aeration in both lungs post intubation, with patchy airspace opacities scattered throughout both lungs. No visible pleural effusions. IMPRESSION: 1. Endotracheal tube tip in satisfactory position projecting approximately 5 cm above the carina. 2. Gastric tube tip in the distal stomach. 3. Improved aeration in both lungs post intubation, with patchy pneumonia scattered throughout  both lungs. Electronically Signed   By: Evangeline Dakin M.D.   On: 03/13/2019 10:22   Dg Chest Port 1 View  Result Date: 03/13/2019 CLINICAL DATA:  Pulmonary infiltrates. EXAM: PORTABLE CHEST 1 VIEW COMPARISON:  03/11/2019 FINDINGS: Patchy bilateral airspace disease with significant worsening. The heart is largely obscured without evident change in size. Lung volumes are low. Where there is aeration, no Kerley lines are seen. No pleural fluid or pneumothorax IMPRESSION: Bilateral pneumonia with significant progression compared to 03/11/2019. Electronically Signed   By: Monte Fantasia M.D.   On: 03/13/2019 07:19   Dg Chest Port 1 View  Result Date: 03/11/2019 CLINICAL DATA:  Cough EXAM: PORTABLE CHEST 1 VIEW COMPARISON:  01/19/2018 FINDINGS: Examination is degraded due to portable technique. Grossly unchanged cardiac silhouette and mediastinal contours given AP projection. Potential developing slightly nodular airspace opacities within the bilateral upper lungs. No pleural effusion or pneumothorax no evidence of edema. No acute osseous abnormalities. IMPRESSION: Potential developing  nodular airspace opacities within the bilateral upper lungs, potentially artifactual due to technique though developing infection could have a similar appearance. Clinical correlation is advised. Further evaluation with a PA and lateral chest radiograph may be obtained as clinically indicated. Electronically Signed   By: Sandi Mariscal M.D.   On: 03/11/2019 13:02   Dg Abd Portable 1v  Result Date: 03/27/2019 CLINICAL DATA:  Check nasogastric catheter placement EXAM: PORTABLE ABDOMEN - 1 VIEW COMPARISON:  03/26/2019 FINDINGS: Scattered large and small bowel gas is noted. Gastric catheter is noted extending into the duodenum. No bony abnormality is noted. Persistent left basilar infiltrate is seen. IMPRESSION: Left basilar infiltrate is noted. Gastric catheter extending into the duodenum. Electronically Signed   By: Inez Catalina  M.D.   On: 03/27/2019 22:51   Dg Abd Portable 1v  Result Date: 03/26/2019 CLINICAL DATA:  63 year old male with NG tube placement EXAM: PORTABLE ABDOMEN - 1 VIEW COMPARISON:  03/25/2019, FINDINGS: Gastric tube terminates in the region of the pylorus. Interval removal of weighted tip enteric feeding tube. Gas within stomach small bowel and colon. IMPRESSION: Gastric tube terminates near the pylorus. Interval removal of the weighted tip enteric feeding tube. Electronically Signed   By: Corrie Mckusick D.O.   On: 03/26/2019 15:37   Dg Abd Portable 1v  Result Date: 03/25/2019 CLINICAL DATA:  Evaluate feeding tube EXAM: PORTABLE ABDOMEN - 1 VIEW COMPARISON:  None. FINDINGS: The distal tip of the feeding tube now all flips back on itself and is probably in the distal stomach. IMPRESSION: The distal tip of the feeding tube is likely in the stomach, directed to the left. Electronically Signed   By: Dorise Bullion III M.D   On: 03/25/2019 16:43   Dg Abd Portable 1v  Result Date: 03/25/2019 CLINICAL DATA:  Evaluate feeding tube. EXAM: PORTABLE ABDOMEN - 1 VIEW COMPARISON:  March 25, 2019 FINDINGS: More of the feeding tube is located in the stomach. The distal tip is likely in the proximal duodenum, it least 14 cm proximal to the ligament of Treitz. IMPRESSION: Much of the advancement resulted in increased feeding tube in the gastric fundus. However, the distal tip has been advanced approximately 1 cm and likely terminates in the proximal duodenum, at least 14 cm proximal to the ligament of Treitz. Electronically Signed   By: Dorise Bullion III M.D   On: 03/25/2019 14:17   Dg Abd Portable 1v  Result Date: 03/25/2019 CLINICAL DATA:  Evaluate tube placement EXAM: PORTABLE ABDOMEN - 1 VIEW COMPARISON:  November 30, 2013 FINDINGS: A feeding tube terminates in the right mid abdomen, either in the distal stomach or proximal duodenum. IMPRESSION: A feeding tube terminates in the right mid abdomen, either in the distal  stomach or proximal most aspect of the duodenum. Recommend advancement before use. Electronically Signed   By: Dorise Bullion III M.D   On: 03/25/2019 12:39   [place] Ekg Site Rite  Result Date: 03/13/2019 If Site Rite image not attached, placement could not be confirmed due to current cardiac rhythm.   Phillips Climes M.D on 03/28/2019 at 11:29 AM  Between 7am to 7pm - Pager - [phone]  After 7pm go to [email or web address] - password [facility]  [facility] -  Office  [phone]

## 2019-03-28 NOTE — Progress Notes (Signed)
Pt with labored breathing, rhonchi noted in both lung fields, and rattling heard from back of his throat. Pt's oxygen saturation dropped to lower 80s. PRN inhaler given to pt with no relief. Pt unable to cough up secretions properly. Oral suctioning performed with yankauer. Pt placed on 8L HFNC. Rapid response and respiratory called to bedside. On call provider made aware of situation. New order for chest xray and NT suctioning. Pt now high risk and placed on airborne precautions with observer present. Will continue to monitor closely.

## 2019-03-28 NOTE — Progress Notes (Signed)
Inpatient Diabetes Program Recommendations  AACE/ADA: New Consensus Statement on Inpatient Glycemic Control (2015)  Target Ranges:  Prepandial:   less than 140 mg/dL      Peak postprandial:   less than 180 mg/dL (1-2 hours)      Critically ill patients:  140 - 180 mg/dL   Lab Results  Component Value Date   GLUCAP 413 (H) 03/28/2019   HGBA1C 12.4 (H) 03/11/2019    Review of Glycemic Control  Would benefit from TF coverage.  Inpatient Diabetes Program Recommendations:     Add Novolog 3 units Q4H for TF coverage. (Hold if TF stopped for any reason)  Continue to follow closely.  Thank you. Ailene Ards, RD, LDN, CDE Inpatient Diabetes Coordinator [phone]

## 2019-03-28 NOTE — Progress Notes (Signed)
PCCM INTERVAL PROGRESS NOTE  Bronson Ing Updated about intubation   Joneen Roach, AGACNP-BC [facility] Pulmonary/Critical Care Pager [phone] or [phone]  03/28/2019 4:12 PM

## 2019-03-28 NOTE — Progress Notes (Addendum)
Patient SATS are running 84-92 on 8L HFNC. Critical Care team at the bedside.

## 2019-03-28 NOTE — Procedures (Signed)
Intubation Procedure Note Benjamin Cohen [id number] 1955/12/19  Procedure: Intubation Indications: Respiratory insufficiency  Procedure Details Consent: Risks of procedure as well as the alternatives and risks of each were explained to the (patient/caregiver).  Consent for procedure obtained. Time Out: Verified patient identification, verified procedure, site/side was marked, verified correct patient position, special equipment/implants available, medications/allergies/relevent history reviewed, required imaging and test results available.  Performed  Maximum sterile technique was used including cap, gloves, gown, hand hygiene and mask.  Mac3 Glidescope.   7.5 ETT was placed using Mac-3 Glidescope without difficulty. There was some glottic erythema but good visualization. White secretions suctioned prior to tube placement. Positioning confirmed by visualization, ETCO2 and auscultation.   Meds given >> etomidate 39m, rocuronium 640m versed 46m50mfentanyl 100 mcg    Evaluation Hemodynamic Status: BP stable throughout; O2 sats: stable throughout Patient's Current Condition: stable Complications: No apparent complications Patient did tolerate procedure well. Chest X-ray ordered to verify placement.  CXR: pending.    RobBaltazar ApoD, PhD 03/28/2019, 4:01 PM [facility] [phone] if no answer [phone]

## 2019-03-28 NOTE — Progress Notes (Signed)
Pharmacy Antibiotic Note  Benjamin Cohen is a 63 y.o. male admitted on 03/11/2019 with COVID-19.  Pharmacy has been consulted for Unasyn dosing.  -He is currently Afebrile with normal WBC, however noted still with labored breathing & copious secretions. CXR overnight + L infiltrate.  Plan: Unasyn 3gm IV q6h F/u renal function  Height: 5\' 7"  (170.2 cm) Weight: 183 lb 13.8 oz (83.4 kg) IBW/kg (Calculated) : 66.1  Temp (24hrs), Avg:98.9 F (37.2 C), Min:98.5 F (36.9 C), Max:99.3 F (37.4 C)  Recent Labs  Lab 03/22/19 0500 03/25/19 0511 03/26/19 0823 03/27/19 0500 03/27/19 0648 03/28/19 0613  WBC 5.4 7.1 6.1 6.6  --  7.0  CREATININE 1.16 1.04 1.17 1.31* 1.40* 1.14    Estimated Creatinine Clearance: 66.7 mL/min (by C-G formula based on SCr of 1.14 mg/dL).    No Known Allergies  Antimicrobials this admission:  4/1 HCQ >>  4/6 3/31 azith>> 4/5 3/31 CTX>> 4/5 4/3 Actemra x 1 4/17 Fluconazole 100mg  IV q24 >> 4/17 Unasyn>>  Microbiology results:  3/31 RVP neg 3/31 BCx2: 1/4 Bcx GPC, no result on BCID> likely contaminant - resulted Granulicatella adiacens 3/31 novel coronavirus NAA: detected 3/31 MRSA PCR neg 4/1 novel coronavirus NAA: detected 4/2 trach asp: normal flora- FINAL 4/1 IL-6: 117.1 elevated 4/1 resp panel pcr: neg 4/6 Quantiferon-TB Gold: negative  Thank you for allowing pharmacy to be a part of this patient's care.  Elson Clan 03/28/2019 8:27 AM

## 2019-03-28 NOTE — Progress Notes (Signed)
Pt's SAT Probe wasn't picking up his SAT RT placed the Probe on the Pt's ear. The Pt's SATS are running 93-96% on 7L salter. RT and nurse used a sx catheter an yaunker to suction the Pt. His secretions were whit/tan thick secretions. RT will continue to monitor

## 2019-03-28 NOTE — Progress Notes (Signed)
Pharmacy Antibiotic Note  Benjamin Cohen is a 63 y.o. male admitted on 03/11/2019 with HAP and positive for coronavirus and multiple abx's used already.  Pharmacy has been consulted for vanc/cefepime dosing.  Plan: 1) Vanc 1750mg  IV x 1 then 1500mg  IV q24 - goal AUC 400-550 2) Cefepime 2g IV q8 for CrCl > 60 ml/min  Height: 5\' 7"  (170.2 cm) Weight: 183 lb 13.8 oz (83.4 kg) IBW/kg (Calculated) : 66.1  Temp (24hrs), Avg:98.8 F (37.1 C), Min:98.5 F (36.9 C), Max:99 F (37.2 C)  Recent Labs  Lab 03/22/19 0500 03/25/19 0511 03/26/19 0823 03/27/19 0500 03/27/19 0648 03/28/19 0613  WBC 5.4 7.1 6.1 6.6  --  7.0  CREATININE 1.16 1.04 1.17 1.31* 1.40* 1.14    Estimated Creatinine Clearance: 66.7 mL/min (by C-G formula based on SCr of 1.14 mg/dL).    No Known Allergies  Antimicrobials this admission: 4/1 HCQ >>  4/6 3/31 azith>> 4/5 3/31 CTX>> 4/5 4/3 Actemra x 1 4/17 Fluconazole 100mg  IV q24 >> 4/17 Unasyn>> 4/17 4/17 Vanc >> 4/17 Cefepime >>  Dose adjustments this admission:   Microbiology results: 3/31 RVP neg 3/31 BCx2: 1/4 Bcx GPC, no result on BCID> likely contaminant - resulted Granulicatella adiacens 3/31 novel coronavirus NAA: detected 3/31 MRSA PCR neg 4/1 novel coronavirus NAA: detected 4/2 trach asp: normal flora- FINAL 4/1 IL-6: 117.1 elevated 4/1 resp panel pcr: neg 4/6 Quantiferon-TB Gold: negative  Thank you for allowing pharmacy to be a part of this patient's care.  Berkley Harvey 03/28/2019 2:30 PM

## 2019-03-28 NOTE — Progress Notes (Signed)
PT Cancellation Note  Patient Details Name: ETTA GLEAVE MRN: [id number] DOB: 1956/09/28   Cancelled Treatment:     PT deferred this date, pt condition declining with possible transfer to ICU.  Will follow.   Kinleigh Nault 03/28/2019, 8:59 AM

## 2019-03-29 LAB — CBC
HCT: 41.9 % (ref 39.0–52.0)
Hemoglobin: 12.2 g/dL — ABNORMAL LOW (ref 13.0–17.0)
MCH: 26.1 pg (ref 26.0–34.0)
MCHC: 29.1 g/dL — ABNORMAL LOW (ref 30.0–36.0)
MCV: 89.7 fL (ref 80.0–100.0)
Platelets: 166 10*3/uL (ref 150–400)
RBC: 4.67 MIL/uL (ref 4.22–5.81)
RDW: 14 % (ref 11.5–15.5)
WBC: 8.2 10*3/uL (ref 4.0–10.5)
nRBC: 0 % (ref 0.0–0.2)

## 2019-03-29 LAB — COMPREHENSIVE METABOLIC PANEL
ALT: 28 U/L (ref 0–44)
AST: 27 U/L (ref 15–41)
Albumin: 3.2 g/dL — ABNORMAL LOW (ref 3.5–5.0)
Alkaline Phosphatase: 103 U/L (ref 38–126)
Anion gap: 12 (ref 5–15)
BUN: 46 mg/dL — ABNORMAL HIGH (ref 8–23)
CO2: 24 mmol/L (ref 22–32)
Calcium: 8.7 mg/dL — ABNORMAL LOW (ref 8.9–10.3)
Chloride: 115 mmol/L — ABNORMAL HIGH (ref 98–111)
Creatinine, Ser: 1.61 mg/dL — ABNORMAL HIGH (ref 0.61–1.24)
GFR calc Af Amer: 51 mL/min — ABNORMAL LOW (ref >60)
GFR calc non Af Amer: 44 mL/min — ABNORMAL LOW (ref >60)
Glucose, Bld: 212 mg/dL — ABNORMAL HIGH (ref 70–99)
Potassium: 3.5 mmol/L (ref 3.5–5.1)
Sodium: 151 mmol/L — ABNORMAL HIGH (ref 135–145)
Total Bilirubin: 0.9 mg/dL (ref 0.3–1.2)
Total Protein: 7.5 g/dL (ref 6.5–8.1)

## 2019-03-29 LAB — GLUCOSE, CAPILLARY
Glucose-Capillary: 193 mg/dL — ABNORMAL HIGH (ref 70–99)
Glucose-Capillary: 170 mg/dL — ABNORMAL HIGH (ref 70–99)
Glucose-Capillary: 198 mg/dL — ABNORMAL HIGH (ref 70–99)
Glucose-Capillary: 142 mg/dL — ABNORMAL HIGH (ref 70–99)
Glucose-Capillary: 272 mg/dL — ABNORMAL HIGH (ref 70–99)
Glucose-Capillary: 245 mg/dL — ABNORMAL HIGH (ref 70–99)

## 2019-03-29 MED ORDER — DOCUSATE SODIUM 50 MG/5ML PO LIQD: 100.0000 mg | Freq: Two times a day (BID) | ORAL | Status: DC | PRN

## 2019-03-29 MED ORDER — LACTATED RINGERS IV BOLUS
1000.0000 mL | Freq: Once | INTRAVENOUS | Status: AC
  Administered 2019-03-29: 1000 mL via INTRAVENOUS

## 2019-03-29 MED ORDER — VANCOMYCIN HCL IN DEXTROSE 1-5 GM/200ML-% IV SOLN
1000.0000 mg | INTRAVENOUS | Status: DC
  Administered 2019-03-29 – 2019-03-31 (×3): 1000 mg via INTRAVENOUS
  Filled 2019-03-29 (×4): qty 200

## 2019-03-29 MED ORDER — SODIUM CHLORIDE 0.9 % IV SOLN
2.0000 g | Freq: Two times a day (BID) | INTRAVENOUS | Status: DC
  Administered 2019-03-29: 16:00:00 2 g via INTRAVENOUS
  Filled 2019-03-29 (×2): qty 2

## 2019-03-29 MED ORDER — POLYETHYLENE GLYCOL 3350 17 G PO PACK: 17.0000 g | PACK | Freq: Every day | ORAL | Status: DC | PRN

## 2019-03-29 NOTE — Progress Notes (Signed)
PCCM Interval Note  I spoke with the patient's ex-wife Bronson Ing, explained that we were planning transfer to [facility] when able. She was concerned that he would be at more risk if he were to be re-exposed to COVID-19 disease, that he might not be able to receive same level of care in a busier ICU. I reassured her about both of these issues. I believe all questions were answered to her satisfaction. We will of course update her when he gets to the new ICU, is settled there.   Levy Pupa, MD, PhD 03/29/2019, 12:21 PM [facility] [phone] or if no answer [phone]

## 2019-03-29 NOTE — Progress Notes (Signed)
Pharmacy Antibiotic Note  Benjamin Cohen is a 63 y.o. male admitted on 03/11/2019 with HAP and positive for coronavirus and multiple abx's used already.  Pharmacy has been consulted for vanc/cefepime dosing.  Today, 03/29/19  Day 2 Vanc/cefepime  Rising SCr  Afebrile  WBC stable  Plan: 1) Due to rising scr, change vanc from 1500mg  to 1g IV q24- goal AUC 400-550 2) Also change cefepime form 2g IV q8to 2g IV q12 3) Daily SCr - closely monitor vanc as almost expect SCr to rise again tomorrow  Height: 5\' 7"  (170.2 cm) Weight: 185 lb 6.5 oz (84.1 kg) IBW/kg (Calculated) : 66.1  Temp (24hrs), Avg:97.9 F (36.6 C), Min:96.6 F (35.9 C), Max:99 F (37.2 C)  Recent Labs  Lab 03/25/19 0511 03/26/19 0823 03/27/19 0500 03/27/19 0648 03/28/19 0613 03/29/19 0434  WBC 7.1 6.1 6.6  --  7.0 8.2  CREATININE 1.04 1.17 1.31* 1.40* 1.14 1.61*    Estimated Creatinine Clearance: 47.4 mL/min (A) (by C-G formula based on SCr of 1.61 mg/dL (H)).    No Known Allergies  Antimicrobials this admission: 4/1 HCQ >>  4/6 3/31 azith>> 4/5 3/31 CTX>> 4/5 4/3 Actemra x 1 4/17 Fluconazole 100mg  IV q24 >> 4/17 Unasyn>> 4/17 4/17 Vanc >> 4/17 Cefepime >>  Dose adjustments this admission:   Microbiology results: 3/31 RVP neg 3/31 BCx2: 1/4 Bcx GPC, no result on BCID> likely contaminant - resulted Granulicatella adiacens 3/31 novel coronavirus NAA: detected 3/31 MRSA PCR neg 4/1 novel coronavirus NAA: detected 4/2 trach asp: normal flora- FINAL 4/1 IL-6: 117.1 elevated 4/1 resp panel pcr: neg 4/6 Quantiferon-TB Gold: negative  Thank you for allowing pharmacy to be a part of this patient's care.  Berkley Harvey 03/29/2019 10:52 AM

## 2019-03-29 NOTE — Progress Notes (Signed)
PT Cancellation Note  Patient Details Name: NORMA MINETTE MRN: [id number] DOB: 01/10/1956   Cancelled Treatment:     PT deferred this date.  Pt re-intubated yesterday afternoon and continues on vent.  Will follow.   Volanda Mangine 03/29/2019, 8:13 AM

## 2019-03-29 NOTE — Progress Notes (Signed)
[facility] PCCM Attending, Justice Britain  Benjamin Cohen Benjamin Cohen is well-known to our service.  He was diagnosed with COVID-19 after attending a wedding where multiple family members were sick.  He developed ARDS and required intubation.  He was intubated on April 2, received a dose of Actemra on April 3, required prone positioning, paralytic therapy to maintain adequate ventilator synchrony for oxygenation.  Ultimately he was extubated on April 12 but unfortunately 5 days later came back to the ICU largely due to inability to control his secretions requiring intubation.  After intubation he had a tracheal aspirate culture performed which is now growing staph aureus.  Today he has been afebrile, his white count is normal.  His creatinine did increase slightly overnight.  Urine output has been adequate.  On physical exam his O2 saturation is 100% on 40% FiO2 and only 5 of PEEP. He is generally weak, crackles in bases of his lungs Cardiovascular regular rate and rhythm no murmurs gallops rubs Belly soft nontender nondistended  Lab work reviewed showing a creatinine of 1.6 today which is up from 1.1 yesterday White blood cell count is normal  Chest x-ray images independently reviewed showing some bibasilar infiltrate  Impression: Staph pneumonia: Continue vancomycin, stop cefepime Acute respiratory failure with hypoxemia largely due to inability control secretions and generalized weakness: Continue nutrition, continue full mechanical ventilatory support Consider tracheostomy after discussion with wife, would need to have [facility] test for COVID-19 sent from lower respiratory secretions. Acute kidney injury: Bolus lactated Ringer's today  Additional critical care time today by me is 35 minutes  [name], MD [place] PCCM Pager: [phone] Cell: [phone] If no response, call [phone]

## 2019-03-29 NOTE — Progress Notes (Signed)
PROGRESS NOTE    RILLEY STASH  YIR:[id number] DOB: [birth year] DOA: 03/11/2019 PCP: Filiberto Pinks    Brief Narrative:  63 y/o M admitted 3/31 with reports of fever, cough, SOB and confusion. He was recently at a wedding mid March and after multiple family members were diagnosed with COVID including his wife. Developed worsening hypoxemia, respiratory distress 4/1 and PCCM consulted for evaluation. Intubated 4/2 with ARDS.  Was extubated 03/23/2019, usually with no oxygen requirement.  Patient remained frail, aspirating.  He started going back on oxygen and ended up reintubated on 03/28/2019.   3/31 Admit, 2L, bilateral patchy infiltrates  4/01 PCCM consulted 4/02 Intubated 4/03 toclizamab x 1 4/05 Net positive, remains on paralytic, still prone 4/06 Supine, plateau pressure 23, 50% / 12 PEEP; complete 5 days of plaquenil 4/08 Plateau 23, PEEP 12 / 40% 4/14 transfer to floor bed 4/17 transfer to stepdown and ETT    Assessment & Plan:   Principal Problem:   Acute respiratory failure with hypoxia ([place]) Active Problems:   Diabetes ([place])   HLD (hyperlipidemia)   CVA (cerebral vascular accident) ([place])   Pressure injury of skin   COVID-19   Dysphagia  Acute hypoxic respiratory failure/ARDS secondary to COVID-19  -Patient required intubation, he was successfully extubated for 11/30/2019 -He received Actemra -He was treated with Plaquenil x5 days -And was on room air once extubated, but he did have frailty, difficult to manage secretions and required increasing oxygen.  -Reintubated on 03/28/2019.  -Treat as healthcare acquired pneumonia, post viral pneumonia and aspiration.  -Antibiotics broadened to cefepime and vancomycin.  -Patient will need prolonged ventilator management.  -We will transfer to [facility].     Hypertension: On amlodipine.  PRN hydralazine.  History of a stroke: No new deficit.  He is on a statin and Plavix.  Type 2  diabetes: Blood sugars elevated now because of being on dextrose infusion for hypernatremia.  On increasing dose of long-acting insulin and scheduled insulin.  Continue.  May need to go up on insulin.  Acute kidney injury: Fluctuating levels.  Continue on fluid and monitor closely.  Hypernatremia: Due to free water deficit.  On increasing free water flushes and also on dextrose infusion.  Monitor closely.   DVT prophylaxis: Lovenox Code Status: Full code Family Communication: Patient's ex-wife is next of kin and making decisions. Disposition Plan: Unknown at this time.  Patient may end up on trach and PEG. He will need prolonged ventilator management, will transfer to [facility] when bed available.   Consultants:   PCCM  Procedures:   ETT  Antimicrobials:   Rocephin and azithromycin 03/11/2019-03/15/2019  Hydroxychloroquine 03/12/2019-03/17/2019  Diflucan 03/28/2019-ongoing  Vancomycin and cefepime 03/28/2019-ongoing  Subjective: Patient was seen and examined.  Overnight events noted.  Patient was intubated last evening for increased work of breathing and hypoxia.  Currently remains on 50% FiO2.  He is sedated on vent.  Afebrile overnight. Seen along with critical care team.  Objective: Vitals:   03/29/19 0800 03/29/19 0900 03/29/19 0929 03/29/19 0930  BP: (!) 138/96 (!) 145/91  97/79  Pulse: (!) 107 (!) 105 (!) 101 100  Resp: (!) 24 (!) 24 (!) 24 (!) 24  Temp:      TempSrc:      SpO2: 100% 99% 99% 99%  Weight:      Height:        Intake/Output Summary (Last 24 hours) at 03/29/2019 0947 Last data filed at 03/29/2019 0900 Gross per  24 hour  Intake 2277.67 ml  Output 1600 ml  Net 677.67 ml   Filed Weights   03/27/19 0500 03/28/19 0534 03/29/19 0404  Weight: 82.7 kg 83.4 kg 84.1 kg    Examination:  General exam: Appears calm and comfortable on ventilator. Respiratory system: Appears comfortable.  Conducted upper airway sounds.  Respiratory effort  normal. Cardiovascular system: S1 & S2 heard, RRR. No JVD, murmurs, rubs, gallops or clicks. No pedal edema. Gastrointestinal system: Abdomen is nondistended, soft and nontender. No organomegaly or masses felt. Normal bowel sounds heard. Central nervous system: Sedated on vent. Extremities: Symmetric 5 x 5 power. Skin: No rashes, lesions or ulcers Psychiatry: Sedated on vent.    Data Reviewed: I have personally reviewed following labs and imaging studies  CBC: Recent Labs  Lab 03/25/19 0511 03/26/19 0823 03/27/19 0500 03/28/19 0613 03/29/19 0434  WBC 7.1 6.1 6.6 7.0 8.2  HGB 13.6 13.8 12.2* 12.3* 12.2*  HCT 47.4 46.7 43.2 41.3 41.9  MCV 91.2 91.4 92.1 90.8 89.7  PLT 284 258 220 164 832   Basic Metabolic Panel: Recent Labs  Lab 03/25/19 0511 03/26/19 0823 03/27/19 0500 03/27/19 0648 03/28/19 0613 03/29/19 0434  NA 154* 156* 145 153* 149* 151*  K 3.4* 3.2* 6.9* 3.9 3.7 3.5  CL 119* 123* 117* 121* 117* 115*  CO2 _0 GLUCOSE 197* 189* 561* 317* 362* 212*  BUN 43* 42* 39* 40* 32* 46*  CREATININE 1.04 1.17 1.31* 1.40* 1.14 1.61*  CALCIUM 9.3 9.3 8.2* 8.6* 8.6* 8.7*  MG 2.9* 3.0*  --   --  2.4  --   PHOS  --  4.7*  --   --   --   --    GFR: Estimated Creatinine Clearance: 47.4 mL/min (A) (by C-G formula based on SCr of 1.61 mg/dL (H)). Liver Function Tests: Recent Labs  Lab 03/26/19 0823 03/28/19 0613 03/29/19 0434  AST _1 ALT 38 26 28  ALKPHOS 99 99 103  BILITOT 1.4* 0.8 0.9  PROT 7.4 7.2 7.5  ALBUMIN 3.5 3.2* 3.2*   No results for input(s): LIPASE, AMYLASE in the last 168 hours. No results for input(s): AMMONIA in the last 168 hours. Coagulation Profile: No results for input(s): INR, PROTIME in the last 168 hours. Cardiac Enzymes: No results for input(s): CKTOTAL, CKMB, CKMBINDEX, TROPONINI in the last 168 hours. BNP (last 3 results) No results for input(s): PROBNP in the last 8760 hours. HbA1C: No results for input(s): HGBA1C in  the last 72 hours. CBG: Recent Labs  Lab 03/28/19 1701 03/28/19 1952 03/28/19 2257 03/29/19 0355 03/29/19 0745  GLUCAP 217* 304* 311* 193* 170*   Lipid Profile: No results for input(s): CHOL, HDL, LDLCALC, TRIG, CHOLHDL, LDLDIRECT in the last 72 hours. Thyroid Function Tests: No results for input(s): TSH, T4TOTAL, FREET4, T3FREE, THYROIDAB in the last 72 hours. Anemia Panel: No results for input(s): VITAMINB12, FOLATE, FERRITIN, TIBC, IRON, RETICCTPCT in the last 72 hours. Sepsis Labs: No results for input(s): PROCALCITON, LATICACIDVEN in the last 168 hours.  Recent Results (from the past 240 hour(s))  Culture, respiratory (non-expectorated)     Status: None (Preliminary result)   Collection Time: 03/28/19  3:58 PM  Result Value Ref Range Status   Specimen Description   Final    TRACHEAL ASPIRATE Performed at [facility] [address] [place], [place] [address]    Special Requests   Final    NONE Performed at [facility]  [place] [address] [place], [place] [address]    Gram Stain   Final    ABUNDANT WBC PRESENT,BOTH PMN AND MONONUCLEAR ABUNDANT GRAM POSITIVE COCCI IN PAIRS Performed at [facility], [place] [address] [place], [place] [address]    Culture PENDING  Incomplete   Report Status PENDING  Incomplete         Radiology Studies: Dg Chest Port 1 View  Result Date: 03/28/2019 CLINICAL DATA:  Intubated, COVID-19 positive EXAM: PORTABLE CHEST 1 VIEW COMPARISON:  Chest radiograph from earlier today. FINDINGS: Endotracheal tube tip is 1.8 cm above the carina. Left PICC terminates at the cavoatrial junction. Enteric tube enters stomach with the tip not seen on this image. Stable cardiomediastinal silhouette with normal heart size. No pneumothorax. No pleural effusion. Patchy right infrahilar and left lung base opacity, similar, with improved aeration of lower lungs. IMPRESSION: 1.  Endotracheal tube tip is 1.8 cm above the  carina. 2. Improved lower lung aeration with persistent patchy right infrahilar and left lung base opacities compatible with pneumonia. Electronically Signed   By: Ilona Sorrel M.D.   On: 03/28/2019 16:35   Dg Chest Port 1 View  Result Date: 03/28/2019 CLINICAL DATA:  Hypoxia EXAM: PORTABLE CHEST 1 VIEW COMPARISON:  03/27/2019 FINDINGS: Left lower lobe airspace disease. Small left pleural effusion. No pneumothorax. Stable cardiomediastinal silhouette. No aggressive osseous lesion. IMPRESSION: 1. Left lower lobe airspace disease which may reflect atelectasis versus pneumonia. Small left pleural effusion. Electronically Signed   By: Kathreen Devoid   On: 03/28/2019 14:58   Dg Chest Port 1 View  Result Date: 03/27/2019 CLINICAL DATA:  Positive COVID-19, respiratory distress EXAM: PORTABLE CHEST 1 VIEW COMPARISON:  03/27/2019 FINDINGS: Cardiac shadows within normal limits. Left PICC line and nasogastric catheter are noted and stable. Right lung is clear. Left basilar infiltrate is noted. No bony abnormality is noted. IMPRESSION: Persistent left basilar infiltrate Electronically Signed   By: Inez Catalina M.D.   On: 03/27/2019 22:48   Dg Chest Port 1 View  Result Date: 03/27/2019 CLINICAL DATA:  Hypoxia EXAM: PORTABLE CHEST 1 VIEW COMPARISON:  March 23, 2019 FINDINGS: Central catheter tip is at the cavoatrial junction. Nasogastric tube tip and side port are below the diaphragm. No pneumothorax. There is a small right pleural effusion with patchy airspace opacity in the right base. There is atelectatic change in the left base. Left lung otherwise clear. Heart is upper normal in size with pulmonary vascularity normal. No adenopathy. No bone lesions. IMPRESSION: Tube and catheter positions as described without pneumothorax. Small right pleural effusion with suspected superimposed right base pneumonia. Left base atelectasis noted. Heart upper normal in size. Electronically Signed   By: Lowella Grip III M.D.    On: 03/27/2019 09:57   Dg Abd Portable 1v  Result Date: 03/27/2019 CLINICAL DATA:  Check nasogastric catheter placement EXAM: PORTABLE ABDOMEN - 1 VIEW COMPARISON:  03/26/2019 FINDINGS: Scattered large and small bowel gas is noted. Gastric catheter is noted extending into the duodenum. No bony abnormality is noted. Persistent left basilar infiltrate is seen. IMPRESSION: Left basilar infiltrate is noted. Gastric catheter extending into the duodenum. Electronically Signed   By: Inez Catalina M.D.   On: 03/27/2019 22:51        Scheduled Meds:  amLODipine  5 mg Per Tube QAC breakfast   atorvastatin  40 mg Per Tube q1800   chlorhexidine gluconate (MEDLINE KIT)  15 mL Mouth Rinse BID   clopidogrel  75 mg Per Tube QAC breakfast   enoxaparin (LOVENOX) injection  40 mg Subcutaneous Q24H   feeding supplement (PRO-STAT SUGAR FREE 64)  60 mL Per Tube Q24H   free water  300 mL Per Tube Q6H   insulin aspart  0-15 Units Subcutaneous Q4H   insulin aspart  3 Units Subcutaneous Q4H   insulin glargine  20 Units Subcutaneous Daily   mouth rinse  15 mL Mouth Rinse 10 times per day   nutrition supplement (JUVEN)  1 packet Per Tube BID BM   sodium chloride flush  10-40 mL Intracatheter Q12H   sodium chloride flush  3 mL Intravenous Q12H   Continuous Infusions:  ceFEPime (MAXIPIME) IV Stopped (03/29/19 0845)   feeding supplement (OSMOLITE 1.5 CAL) 55 mL/hr at 03/29/19 0900   fluconazole (DIFLUCAN) IV Stopped (03/29/19 0851)   vancomycin       LOS: 18 days    Time spent: 25 minutes    Barb Merino, MD [facility] Pager [phone]  If 7PM-7AM, please contact night-coverage [email or web address] Password [facility] 03/29/2019, 9:47 AM

## 2019-03-29 NOTE — Progress Notes (Signed)
Patient wife concerned about patient being transported to [facility] as he has had virus for weeks and is concerned he may not still have the virus. RN explained that patient is still having respiratory symptoms and still on the ventilator. This RN explained to wife that this move would be best for the care of the patient, as this is what the hospital was designed for. MD Byrum made aware of concern and he will call wife when he got a chance. After talking to wife via phone for the second time, she was much more agreeable to patient being transported.  Assistant Director Oneita Hurt notified of the incident.  RN at [employer] notified of the incident.   Adolph Pollack RN

## 2019-03-29 NOTE — Progress Notes (Signed)
NAME:  Benjamin Cohen, MRN:  [id number], DOB:  [birth year], LOS: 18 ADMISSION DATE:  03/11/2019, CONSULTATION DATE:  03/12/19 REFERRING MD:  Dr. Rito Ehrlich, CHIEF COMPLAINT:  SOB   Brief History   63 y/o M admitted 3/31 with reports of fever, cough, SOB and confusion.  He was recently at a wedding mid March and after multiple family members were diagnosed with COVID including his wife.  Developed worsening hypoxemia, respiratory distress 4/1 and PCCM consulted for evaluation.  Intubated 4/2 with ARDS.   Past Medical History  HTN, CVA - 2014 and 04/2018  Significant Hospital Events   3/31 Admit, 2L, bilateral patchy infiltrates  4/01 PCCM consulted 4/02 Intubated 4/03 toclizamab x 1 4/05 Net positive, remains on paralytic, still prone 4/06 Supine, plateau pressure 23, 50% / 12 PEEP; complete 5 days of plaquenil 4/08 Plateau 23, PEEP 12 / 40% 4/14 transfer to floor bed 4/17 back to ICU, intubated.  Consults:    Procedures:  ETT 4/2 >> 4/12 ETT 4/17 >>  LUE PICC 4/2 >>    Significant Diagnostic Tests:  LDH 3/31 360  Micro Data:  BCx2 3/31 >> granulicatella adiacens 1/2  COVID 3/31 >> positive  RVP 4/1 >> negative Trach asp 4/2 >> oral flora COVID trach asp 4/2 >> Positive  Quantiferon Gold 4/6 >> negative Tracheal aspirate 4/17 >  Antimicrobials:  Azithro 3/31 >> 4/4 Rocephin 3/31 >> 4/4 Unasyn 4/17 Cefepime 4/17 > Vanco 4/17 >  Interim history/subjective:  Required intubation 4/17 Currently on FiO2 50%, PEEP 5 Plateau pressure is 18 on 8 cc/kg   Objective   Blood pressure 107/84, pulse 96, temperature 97.6 F (36.4 C), temperature source Axillary, resp. rate (!) 24, height 5\' 7"  (1.702 m), weight 84.1 kg, SpO2 99 %.    Vent Mode: PRVC FiO2 (%):  [50 %-100 %] 50 % Set Rate:  [24 bmp] 24 bmp Vt Set:  [530 mL] 530 mL PEEP:  [5 cmH20] 5 cmH20 Plateau Pressure:  [14 cmH20-17 cmH20] 17 cmH20   Intake/Output Summary (Last 24 hours) at 03/29/2019 1111 Last data filed  at 03/29/2019 1000 Gross per 24 hour  Intake 2332.67 ml  Output 1600 ml  Net 732.67 ml   Filed Weights   03/27/19 0500 03/28/19 0534 03/29/19 0404  Weight: 82.7 kg 83.4 kg 84.1 kg   Physical Exam:  General: Weak ill-appearing man, mechanically ventilated Neuro: Opens eyes to voice, nodded to questions, intermittently follow commands bilateral upper extremities HEENT: ET tube in place, no oral lesions.  He has some eschar and skin breakdown at his right maxillary area Cardiovascular: Regular, no murmur Lungs: Distant, clear bilaterally, no wheeze Abdomen: Soft, nondistended with positive bowel sounds Musculoskeletal: No deformities Skin: Some skin breakdown right maxillary area,  Resolved Hospital Problem list   Hypotension, Respiratory Alkalosis, ARDS, Acute metabolic encephalopathy, Acute hypoxic respiratory failure, COVID PNA  Assessment & Plan:   Acute hypoxemic respiratory failure: Has recovered from ARDS related to COVID-19. Now back in ICU with progressive hypoxic failure. Differential includes intermittent aspiration (favored) and atelectasis due to profound weakness and deconditioning.  Consider also pulmonary edema  -PRVC, using 8 cc/kg as it appears his acute lung injury has resolved and this episode is due to a more focal abnormality in the left lower lobe, profound weakness.  Pplat 17-18. -Antibiotics broadened to cover possible HCAP  Acute encephalopathy Ex-wife makes it clear this encephalopathy is acute. Not well understood why he would have this level of mental status depression.  Could just reflect overall weakness, hypoxemia -Attempt to minimize sedating medications if able -Consider brain imaging if persistent  Hypertension.  Hx of CVA, HLD. Plan: -Plavix on hold -Amlodipine  DM II with current hyperglycemia.  Plan -sliding-scale insulin, tube feed coverage, Lantus as ordered -Glimepiride and metformin on hold  Hypernatremia from diuresis. Plan -Free  water, hold off on diuresis at this time  Dysphagia. Plan - TF via NGT. Has failed SLP eval.   Severe deconditioning. Plan -Unfortunately this may have driven his recurrent respiratory failure.  PT-OT when he is able to participate.  Hopefully he can strengthen to a place where we can retry extubation  Best practice:  Diet: NPO DVT prophylaxis: lovenox  GI prophylaxis: n/a  Mobility: as tolerated  Code Status: Full Code [facility]position[facility] ICU[facility]  Labs    CMP Latest Ref Rng & Units 03/29/2019 03/28/2019 03/27/2019  Glucose 70 - 99 mg/dL 161(W212(H) 960(A362(H) 540(J317(H)  BUN 8 - 23 mg/dL 81(X46(H) 91(Y32(H) 78(G40(H)  Creatinine 0.61 - 1.24 mg/dL 9.56(O1.61(H) 1.301.14 8.65(H1.40(H)  Sodium 135 - 145 mmol/L 151(H) 149(H) 153(H)  Potassium 3.5 - 5.1 mmol/L 3.5 3.7 3.9  Chloride 98 - 111 mmol/L 115(H) 117(H) 121(H)  CO2 22 - 32 mmol/L 24 24 24   Calcium 8.9 - 10.3 mg/dL 8.4(O8.7(L) 9.6(E8.6(L) 9.5(M8.6(L)  Total Protein 6.5 - 8.1 g/dL 7.5 7.2 -  Total Bilirubin 0.3 - 1.2 mg/dL 0.9 0.8 -  Alkaline Phos 38 - 126 U/L 103 99 -  AST 15 - 41 U/L 27 16 -  ALT 0 - 44 U/L 28 26 -   CBC Latest Ref Rng & Units 03/29/2019 03/28/2019 03/27/2019  WBC 4.0 - 10.5 K/uL 8.2 7.0 6.6  Hemoglobin 13.0 - 17.0 g/dL 12.2(L) 12.3(L) 12.2(L)  Hematocrit 39.0 - 52.0 % 41.9 41.3 43.2  Platelets 150 - 400 K/uL 166 164 220   CBG (last 3)  Recent Labs    03/29/19 0355 03/29/19 0745 03/29/19 1110  GLUCAP 193* 170* 198*    Independent critical care time 32 minutes  Levy Pupaobert Byrum, MD, PhD 03/29/2019, 11:18 AM [facility] [phone] or if no answer [phone]

## 2019-03-30 ENCOUNTER — Inpatient Hospital Stay (HOSPITAL_COMMUNITY): Payer: 59

## 2019-03-30 DIAGNOSIS — E870 Hyperosmolality and hypernatremia: Secondary | ICD-10-CM

## 2019-03-30 DIAGNOSIS — A4901 Methicillin susceptible Staphylococcus aureus infection, unspecified site: Secondary | ICD-10-CM

## 2019-03-30 DIAGNOSIS — J1520 Pneumonia due to staphylococcus, unspecified: Secondary | ICD-10-CM

## 2019-03-30 LAB — CBC
HCT: 35.5 % — ABNORMAL LOW (ref 39.0–52.0)
Hemoglobin: 10.5 g/dL — ABNORMAL LOW (ref 13.0–17.0)
MCH: 26.6 pg (ref 26.0–34.0)
MCHC: 29.6 g/dL — ABNORMAL LOW (ref 30.0–36.0)
MCV: 89.9 fL (ref 80.0–100.0)
Platelets: ADEQUATE 10*3/uL (ref 150–400)
RBC: 3.95 MIL/uL — ABNORMAL LOW (ref 4.22–5.81)
RDW: 13.9 % (ref 11.5–15.5)
WBC: 7.3 10*3/uL (ref 4.0–10.5)
nRBC: 0 % (ref 0.0–0.2)

## 2019-03-30 LAB — BASIC METABOLIC PANEL
Anion gap: 9 (ref 5–15)
BUN: 52 mg/dL — ABNORMAL HIGH (ref 8–23)
CO2: 25 mmol/L (ref 22–32)
Calcium: 8.4 mg/dL — ABNORMAL LOW (ref 8.9–10.3)
Chloride: 115 mmol/L — ABNORMAL HIGH (ref 98–111)
Creatinine, Ser: 1.41 mg/dL — ABNORMAL HIGH (ref 0.61–1.24)
GFR calc Af Amer: >60 mL/min (ref >60)
GFR calc non Af Amer: 52 mL/min — ABNORMAL LOW (ref >60)
Glucose, Bld: 212 mg/dL — ABNORMAL HIGH (ref 70–99)
Potassium: 3.1 mmol/L — ABNORMAL LOW (ref 3.5–5.1)
Sodium: 149 mmol/L — ABNORMAL HIGH (ref 135–145)

## 2019-03-30 LAB — GLUCOSE, CAPILLARY
Glucose-Capillary: 198 mg/dL — ABNORMAL HIGH (ref 70–99)
Glucose-Capillary: 250 mg/dL — ABNORMAL HIGH (ref 70–99)
Glucose-Capillary: 241 mg/dL — ABNORMAL HIGH (ref 70–99)
Glucose-Capillary: 249 mg/dL — ABNORMAL HIGH (ref 70–99)
Glucose-Capillary: 233 mg/dL — ABNORMAL HIGH (ref 70–99)

## 2019-03-30 LAB — CULTURE, RESPIRATORY W GRAM STAIN

## 2019-03-30 LAB — MAGNESIUM: Magnesium: 2.7 mg/dL — ABNORMAL HIGH (ref 1.7–2.4)

## 2019-03-30 MED ORDER — FAMOTIDINE 40 MG/5ML PO SUSR
20.0000 mg | Freq: Two times a day (BID) | ORAL | Status: DC
  Administered 2019-03-30 – 2019-04-01 (×4): 20 mg via ORAL
  Filled 2019-03-30 (×6): qty 2.5

## 2019-03-30 MED ORDER — POTASSIUM CHLORIDE 20 MEQ/15ML (10%) PO SOLN
40.0000 meq | Freq: Once | ORAL | Status: AC
  Administered 2019-03-30: 09:00:00 40 meq
  Filled 2019-03-30: qty 30

## 2019-03-30 NOTE — Progress Notes (Signed)
I spoke to pts ex-wife Bronson Ing (she called into room). I updated her on plan of care.

## 2019-03-30 NOTE — Progress Notes (Addendum)
PROGRESS NOTE    Benjamin ISADORE  Cohen:[id number] DOB: 1956/08/27 DOA: 03/11/2019 PCP: Filiberto Pinks    Brief Narrative:  63 y/o M admitted 3/31 with reports of fever, cough, SOB and confusion. He was recently at a wedding mid March and after multiple family members were diagnosed with COVID including his wife. Developed worsening hypoxemia, respiratory distress 4/1 and PCCM consulted for evaluation. Intubated 4/2 with ARDS.  Was extubated 03/23/2019, usually with no oxygen requirement.  Patient remained frail, aspirating.  He started going back on oxygen and ended up reintubated on 03/28/2019.   3/31 Admit, 2L, bilateral patchy infiltrates  4/01 PCCM consulted 4/02 Intubated 4/03 toclizamab x 1 4/05 Net positive, remains on paralytic, still prone 4/06 Supine, plateau pressure 23, 50% / 12 PEEP; complete 5 days of plaquenil 4/08 Plateau 23, PEEP 12 / 40% 4/14 transfer to floor bed 4/17 transfer to stepdown and ETT    Assessment & Plan:   Principal Problem:   Acute respiratory failure with hypoxia ([place]) Active Problems:   Diabetes ([place])   HLD (hyperlipidemia)   CVA (cerebral vascular accident) ([place])   Pressure injury of skin   COVID-19   Dysphagia  Acute hypoxic respiratory failure/ARDS secondary to COVID-19  -Patient required intubation, he was successfully extubated for 11/30/2019 -He received Actemra -He was treated with Plaquenil x5 days -And was on room air once extubated, but he did have frailty, difficult to manage secretions and required increasing oxygen.  -Reintubated on 03/28/2019.  -Treat as healthcare acquired pneumonia, post viral pneumonia and aspiration.  -Antibiotics broadened to cefepime and vancomycin.  -Cultures from tracheal aspirate positive for staph aureus.  Cefepime was discontinued.  Patient remains on vancomycin.  Patient also noted to be on fluconazole.  No clear reason for same.  No yeast noted in any of the cultures or UA.  Will  discontinue for now. Pulmonology following and managing ventilator.   Essential hypertension: On amlodipine.  PRN hydralazine.  History of a stroke: No new deficit.  He is on a statin and Plavix.  Acute kidney injury: Creatinine is fluctuating.  Continue to monitor.  Monitor urinary output.  He had 2 unmeasured outputs yesterday.  Hypernatremia: Due to free water deficit.  Sodium peaked at 156.  Noted to be 149 today.  Continue free water flushes down the feeding tube.    Hypokalemia Repleted this morning.  Magnesium 2.7.  Type 2 diabetes: Continue to monitor CBGs.  No longer on dextrose infusion.  Continue Lantus SSI.  May need to increase the dose of Lantus.    Nutrition On tube feeds.  Free water flushes.  DVT prophylaxis: Lovenox Code Status: Full code Family Communication: Patient's ex-wife is next of kin and making decisions. Disposition Plan: Unknown at this time.    Consultants:   PCCM  Procedures:   ETT  Antimicrobials:   Rocephin and azithromycin 03/11/2019-03/15/2019  Hydroxychloroquine 03/12/2019-03/17/2019  Diflucan 03/28/2019-ongoing  Vancomycin and cefepime 03/28/2019-ongoing  Subjective: Patient noted to be awake alert.  Currently being weaned.  Does not appear to be in any discomfort.    Objective: Vitals:   03/30/19 0722 03/30/19 0800 03/30/19 0830 03/30/19 0900  BP: 115/76 129/86 125/80 127/78  Pulse: 97     Resp: (!) 23     Temp:      TempSrc:      SpO2: 100% 100% 99% 100%  Weight:      Height:        Intake/Output Summary (Last 24 hours) at  03/30/2019 1011 Last data filed at 03/30/2019 0900 Gross per 24 hour  Intake 1908.58 ml  Output 850 ml  Net 1058.58 ml   Filed Weights   03/28/19 0534 03/29/19 0404 03/30/19 0500  Weight: 83.4 kg 84.1 kg 90.1 kg    Examination:  General appearance: Noted to be awake.  Calm.  Comfortable.  On mechanical ventilation. Resp: Coarse breath sounds bilaterally.  Diminished air entry at the bases.   Few crackles.  No wheezing.   Cardio: S1-S2 is normal regular.  No S3-S4.  No rubs murmurs or bruit GI: Abdomen is soft.  Nontender nondistended.  Bowel sounds are present normal.  No masses organomegaly Extremities: No edema.  Full range of motion of lower extremities. Neurologic: On mechanical ventilation.  Following commands.  Awake.   Data Reviewed: I have personally reviewed following labs and imaging studies  CBC: Recent Labs  Lab 03/26/19 0823 03/27/19 0500 03/28/19 0613 03/29/19 0434 03/30/19 0310  WBC 6.1 6.6 7.0 8.2 7.3  HGB 13.8 12.2* 12.3* 12.2* 10.5*  HCT 46.7 43.2 41.3 41.9 35.5*  MCV 91.4 92.1 90.8 89.7 89.9  PLT 258 220 164 166 PLATELET CLUMPS NOTED ON SMEAR, COUNT APPEARS ADEQUATE   Basic Metabolic Panel: Recent Labs  Lab 03/25/19 0511 03/26/19 0823 03/27/19 0500 03/27/19 0648 03/28/19 0613 03/29/19 0434 03/30/19 0310  NA 154* 156* 145 153* 149* 151* 149*  K 3.4* 3.2* 6.9* 3.9 3.7 3.5 3.1*  CL 119* 123* 117* 121* 117* 115* 115*  CO2 26 25 23 24 24 24 25   GLUCOSE 197* 189* 561* 317* 362* 212* 212*  BUN 43* 42* 39* 40* 32* 46* 52*  CREATININE 1.04 1.17 1.31* 1.40* 1.14 1.61* 1.41*  CALCIUM 9.3 9.3 8.2* 8.6* 8.6* 8.7* 8.4*  MG 2.9* 3.0*  --   --  2.4  --  2.7*  PHOS  --  4.7*  --   --   --   --   --    GFR: Estimated Creatinine Clearance: 55.9 mL/min (A) (by C-G formula based on SCr of 1.41 mg/dL (H)). Liver Function Tests: Recent Labs  Lab 03/26/19 0823 03/28/19 0613 03/29/19 0434  AST 26 16 27   ALT 38 26 28  ALKPHOS 99 99 103  BILITOT 1.4* 0.8 0.9  PROT 7.4 7.2 7.5  ALBUMIN 3.5 3.2* 3.2*   CBG: Recent Labs  Lab 03/29/19 1545 03/29/19 1938 03/29/19 2325 03/30/19 0337 03/30/19 0750  GLUCAP 142* 272* 245* 198* 250*    Recent Results (from the past 240 hour(s))  Culture, respiratory (non-expectorated)     Status: None (Preliminary result)   Collection Time: 03/28/19  3:58 PM  Result Value Ref Range Status   Specimen Description    Final    TRACHEAL ASPIRATE Performed at [facility], [place] [address], [place], [place] [address]    Special Requests   Final    NONE Performed at [facility], [place] [address] [place], [place] [address]    Gram Stain   Final    ABUNDANT WBC PRESENT,BOTH PMN AND MONONUCLEAR ABUNDANT GRAM POSITIVE COCCI IN PAIRS    Culture   Final    MODERATE STAPHYLOCOCCUS AUREUS SUSCEPTIBILITIES TO FOLLOW Performed at [facility], [place] [address], [place], [place] [address]    Report Status PENDING  Incomplete         Radiology Studies: Dg Chest Port 1 View  Result Date: 03/30/2019 CLINICAL DATA:  63 y/o M; COVID-19 positive. Acute respiratory failure.  EXAM: PORTABLE CHEST 1 VIEW COMPARISON:  None. FINDINGS: Endotracheal tube tip 3.3 cm above the carina. Enteric tube tip is below the field of view in the abdomen. Patchy opacities in the lung bases. No pleural effusion or pneumothorax. No acute osseous abnormality is evident. IMPRESSION: Endotracheal tube tip 3.3 cm above the carina. Enteric tube tip is below the field of view in the abdomen. Patchy opacities in the lung bases compatible with pneumonia including atypical and viral pneumonia. Electronically Signed   By: Kristine Garbe M.D.   On: 03/30/2019 04:19   Dg Chest Port 1 View  Result Date: 03/28/2019 CLINICAL DATA:  Intubated, COVID-19 positive EXAM: PORTABLE CHEST 1 VIEW COMPARISON:  Chest radiograph from earlier today. FINDINGS: Endotracheal tube tip is 1.8 cm above the carina. Left PICC terminates at the cavoatrial junction. Enteric tube enters stomach with the tip not seen on this image. Stable cardiomediastinal silhouette with normal heart size. No pneumothorax. No pleural effusion. Patchy right infrahilar and left lung base opacity, similar, with improved aeration of lower lungs. IMPRESSION: 1.  Endotracheal tube tip is 1.8 cm above the carina. 2. Improved lower lung aeration with  persistent patchy right infrahilar and left lung base opacities compatible with pneumonia. Electronically Signed   By: Ilona Sorrel M.D.   On: 03/28/2019 16:35   Dg Chest Port 1 View  Result Date: 03/28/2019 CLINICAL DATA:  Hypoxia EXAM: PORTABLE CHEST 1 VIEW COMPARISON:  03/27/2019 FINDINGS: Left lower lobe airspace disease. Small left pleural effusion. No pneumothorax. Stable cardiomediastinal silhouette. No aggressive osseous lesion. IMPRESSION: 1. Left lower lobe airspace disease which may reflect atelectasis versus pneumonia. Small left pleural effusion. Electronically Signed   By: Kathreen Devoid   On: 03/28/2019 14:58      Scheduled Meds:  amLODipine  5 mg Per Tube QAC breakfast   atorvastatin  40 mg Per Tube q1800   chlorhexidine gluconate (MEDLINE KIT)  15 mL Mouth Rinse BID   clopidogrel  75 mg Per Tube QAC breakfast   enoxaparin (LOVENOX) injection  40 mg Subcutaneous Q24H   feeding supplement (PRO-STAT SUGAR FREE 64)  60 mL Per Tube Q24H   free water  300 mL Per Tube Q6H   insulin aspart  0-15 Units Subcutaneous Q4H   insulin aspart  3 Units Subcutaneous Q4H   insulin glargine  20 Units Subcutaneous Daily   mouth rinse  15 mL Mouth Rinse 10 times per day   nutrition supplement (JUVEN)  1 packet Per Tube BID BM   sodium chloride flush  10-40 mL Intracatheter Q12H   sodium chloride flush  3 mL Intravenous Q12H   Continuous Infusions:  feeding supplement (OSMOLITE 1.5 CAL) 55 mL/hr at 03/30/19 0900   fluconazole (DIFLUCAN) IV 100 mg (03/30/19 0841)   vancomycin Stopped (03/29/19 1840)     LOS: 19 days     Bonnielee Haff, MD [facility]   Pager on [email or web address] Password [email or web address]  03/30/2019, 10:11 AM

## 2019-03-30 NOTE — Progress Notes (Signed)
NAME:  Benjamin Cohen, MRN:  [id number], DOB:  1955-12-20, LOS: 19 ADMISSION DATE:  03/11/2019, CONSULTATION DATE:  03/12/19 REFERRING MD:  Dr. Rito Ehrlich, CHIEF COMPLAINT:  SOB   Brief History   63 y/o M admitted 3/31 with reports of fever, cough, SOB and confusion.  He was recently at a wedding mid March and after multiple family members were diagnosed with COVID including his wife.  Developed worsening hypoxemia, respiratory distress 4/1 and PCCM consulted for evaluation.  Intubated 4/2 with ARDS.   Past Medical History  HTN, CVA - 2014 and 04/2018  Significant Hospital Events   3/31 Admit, 2L, bilateral patchy infiltrates  4/01 PCCM consulted 4/02 Intubated 4/03 toclizamab x 1 4/05 Net positive, remains on paralytic, still prone 4/06 Supine, plateau pressure 23, 50% / 12 PEEP; complete 5 days of plaquenil 4/08 Plateau 23, PEEP 12 / 40% 4/14 transfer to floor bed 4/17 back to ICU, intubated. 418 moved to [facility]  Consults:    Procedures:  ETT 4/2 >> 4/12 ETT 4/17 >>  LUE PICC 4/2 >>    Significant Diagnostic Tests:  LDH 3/31 360  Micro Data:  BCx2 3/31 >> granulicatella adiacens 1/2  COVID 3/31 >> positive  RVP 4/1 >> negative Trach asp 4/2 >> oral flora COVID trach asp 4/2 >> Positive  Quantiferon Gold 4/6 >> negative Tracheal aspirate 4/17 > staph aureus  Antimicrobials:  Azithro 3/31 >> 4/4 Rocephin 3/31 >> 4/4 Unasyn 4/17 Cefepime 4/17 > 4/18 Vanco 4/17 >  Interim history/subjective:  Weaning fairly well this morning Nods head appropriately   Objective   Blood pressure 129/86, pulse 97, temperature 98 F (36.7 C), temperature source Axillary, resp. rate (!) 23, height 5\' 7"  (1.702 m), weight 90.1 kg, SpO2 100 %.    Vent Mode: PRVC FiO2 (%):  [40 %-50 %] 40 % Set Rate:  [24 bmp] 24 bmp Vt Set:  [530 mL] 530 mL PEEP:  [5 cmH20] 5 cmH20 Plateau Pressure:  [18 cmH20-21 cmH20] 20 cmH20   Intake/Output Summary (Last 24 hours) at 03/30/2019 0827 Last data  filed at 03/30/2019 0400 Gross per 24 hour  Intake 1791.5 ml  Output 450 ml  Net 1341.5 ml   Filed Weights   03/28/19 0534 03/29/19 0404 03/30/19 0500  Weight: 83.4 kg 84.1 kg 90.1 kg   Physical Exam:   General:  In bed on vent HENT: NCAT ETT in place PULM: CTA B, vent supported breathing CV: RRR, no mgr GI: BS+, soft, nontender MSK: generally weak, some tone in arms, legs Neuro: awake on vent, nods head appropriately, moves all four extremities well   Resolved Hospital Problem list   Hypotension, Respiratory Alkalosis, ARDS, Acute metabolic encephalopathy, Acute hypoxic respiratory failure, COVID PNA  Assessment & Plan:   Acute hypoxemic respiratory failure due to COVID 19 pneumonia Re-intubated 4/17 due to staph pneumonia, inability to clear secretions -Improved 4/19, Weaning well Consider extubation on 4/20 if continues to tolerate weaning trials However, may need to consider tracheostomy Continue ventilator associated pneumonia prevention Hold diuresis today  Staph aureus pneumonia Continue cefepime  Acute encephalopathy : improved Frequent orientation Low dose sedation as needed for ventilator synchrony RASS goal 0 to -1  Hypernatremia Continue free water replacement   Best practice:  Diet: NPO DVT prophylaxis: lovenox  GI prophylaxis: n/a  Mobility: as tolerated  Code Status: Full Code Disposition: ICU  Labs    CMP Latest Ref Rng & Units 03/30/2019 03/29/2019 03/28/2019  Glucose 70 - 99 mg/dL  212(H) 212(H) 362(H)  BUN 8 - 23 mg/dL 19(J52(H) 47(W46(H) 29(F32(H)  Creatinine 0.61 - 1.24 mg/dL 6.21(H1.41(H) 0.86(V1.61(H) 7.841.14  Sodium 135 - 145 mmol/L 149(H) 151(H) 149(H)  Potassium 3.5 - 5.1 mmol/L 3.1(L) 3.5 3.7  Chloride 98 - 111 mmol/L 115(H) 115(H) 117(H)  CO2 22 - 32 mmol/L 25 24 24   Calcium 8.9 - 10.3 mg/dL 6.9(G8.4(L) 2.9(B8.7(L) 2.8(U8.6(L)  Total Protein 6.5 - 8.1 g/dL - 7.5 7.2  Total Bilirubin 0.3 - 1.2 mg/dL - 0.9 0.8  Alkaline Phos 38 - 126 U/L - 103 99  AST 15 - 41 U/L - 27  16  ALT 0 - 44 U/L - 28 26   CBC Latest Ref Rng & Units 03/30/2019 03/29/2019 03/28/2019  WBC 4.0 - 10.5 K/uL 7.3 8.2 7.0  Hemoglobin 13.0 - 17.0 g/dL 10.5(L) 12.2(L) 12.3(L)  Hematocrit 39.0 - 52.0 % 35.5(L) 41.9 41.3  Platelets 150 - 400 K/uL PLATELET CLUMPS NOTED ON SMEAR, COUNT APPEARS ADEQUATE 166 164   CBG (last 3)  Recent Labs    03/29/19 2325 03/30/19 0337 03/30/19 0750  GLUCAP 245* 198* 250*    Independent critical care time 31 minutes   Heber CarolinaBrent Deshannon Hinchliffe, MD [facility] Pager: [phone] Cell: [phone] If no response, call [phone]

## 2019-03-30 NOTE — TOC Transition Note (Signed)
Transition of Care [facility]) - CM/SW Discharge Note   Patient Details  Name: Benjamin Cohen MRN: [id number] Date of Birth: [birth year]  [facility]) CM/SW Contact:  Durenda Guthrie, RN Phone Number: 03/30/2019, 8:44 AM   Clinical Narrative:   63 yr old gentleman transferred to [facility] from [place]. Intubated. COVID 19 Positive. On IV Vance for pneumonia. Remains on Ventilator.          Patient Goals and CMS Choice    Discharge Placement                       Discharge Plan and Services    Case manager will continue to monitor for appropriate disposition when medically improved. May God Bless him to recover.                              Social Determinants of Health (SDOH) Interventions     Readmission Risk Interventions No flowsheet data found.

## 2019-03-31 DIAGNOSIS — R1310 Dysphagia, unspecified: Secondary | ICD-10-CM

## 2019-03-31 DIAGNOSIS — J15212 Pneumonia due to Methicillin resistant Staphylococcus aureus: Secondary | ICD-10-CM

## 2019-03-31 LAB — CBC
HCT: 31.5 % — ABNORMAL LOW (ref 39.0–52.0)
Hemoglobin: 9.1 g/dL — ABNORMAL LOW (ref 13.0–17.0)
MCH: 26.1 pg (ref 26.0–34.0)
MCHC: 28.9 g/dL — ABNORMAL LOW (ref 30.0–36.0)
MCV: 90.5 fL (ref 80.0–100.0)
Platelets: 139 10*3/uL — ABNORMAL LOW (ref 150–400)
RBC: 3.48 MIL/uL — ABNORMAL LOW (ref 4.22–5.81)
RDW: 13.9 % (ref 11.5–15.5)
WBC: 6 10*3/uL (ref 4.0–10.5)
nRBC: 0 % (ref 0.0–0.2)

## 2019-03-31 LAB — BASIC METABOLIC PANEL
Anion gap: 7 (ref 5–15)
BUN: 39 mg/dL — ABNORMAL HIGH (ref 8–23)
CO2: 25 mmol/L (ref 22–32)
Calcium: 8 mg/dL — ABNORMAL LOW (ref 8.9–10.3)
Chloride: 117 mmol/L — ABNORMAL HIGH (ref 98–111)
Creatinine, Ser: 1.04 mg/dL (ref 0.61–1.24)
GFR calc Af Amer: >60 mL/min (ref >60)
GFR calc non Af Amer: >60 mL/min (ref >60)
Glucose, Bld: 196 mg/dL — ABNORMAL HIGH (ref 70–99)
Potassium: 3.2 mmol/L — ABNORMAL LOW (ref 3.5–5.1)
Sodium: 149 mmol/L — ABNORMAL HIGH (ref 135–145)

## 2019-03-31 LAB — GLUCOSE, CAPILLARY
Glucose-Capillary: 121 mg/dL — ABNORMAL HIGH (ref 70–99)
Glucose-Capillary: 124 mg/dL — ABNORMAL HIGH (ref 70–99)
Glucose-Capillary: 176 mg/dL — ABNORMAL HIGH (ref 70–99)
Glucose-Capillary: 177 mg/dL — ABNORMAL HIGH (ref 70–99)
Glucose-Capillary: 177 mg/dL — ABNORMAL HIGH (ref 70–99)
Glucose-Capillary: 204 mg/dL — ABNORMAL HIGH (ref 70–99)

## 2019-03-31 LAB — VANCOMYCIN, PEAK: Vancomycin Pk: 18 ug/mL — ABNORMAL LOW (ref 30–40)

## 2019-03-31 MED ORDER — POTASSIUM CHLORIDE 20 MEQ/15ML (10%) PO SOLN
40.0000 meq | ORAL | Status: AC
  Administered 2019-03-31 (×2): 40 meq
  Filled 2019-03-31: qty 30

## 2019-03-31 MED ORDER — DEXMEDETOMIDINE HCL IN NACL 400 MCG/100ML IV SOLN
0.4000 ug/kg/h | INTRAVENOUS | Status: DC
  Administered 2019-03-31: 0.4 ug/kg/h via INTRAVENOUS
  Filled 2019-03-31 (×2): qty 100

## 2019-03-31 MED ORDER — ORAL CARE MOUTH RINSE
15.0000 mL | Freq: Two times a day (BID) | OROMUCOSAL | Status: DC
  Administered 2019-03-31 – 2019-04-11 (×19): 15 mL via OROMUCOSAL

## 2019-03-31 MED ORDER — HYDROMORPHONE HCL 1 MG/ML IJ SOLN
0.5000 mg | INTRAMUSCULAR | Status: DC | PRN
  Administered 2019-03-31: 0.5 mg via INTRAVENOUS
  Filled 2019-03-31: qty 0.5

## 2019-03-31 NOTE — Progress Notes (Signed)
NAME:  MOISE MALZAHN, MRN:  [id number], DOB:  Jan 29, 1956, LOS: 20 ADMISSION DATE:  03/11/2019, CONSULTATION DATE:  03/12/19 REFERRING MD:  Dr. Rito Ehrlich, CHIEF COMPLAINT:  SOB   Brief History   63 y/o M admitted 3/31 with reports of fever, cough, SOB and confusion.  He was recently at a wedding mid March and after multiple family members were diagnosed with COVID including his wife.  Developed worsening hypoxemia, respiratory distress 4/1 and PCCM consulted for evaluation.  Intubated 4/2 with ARDS.   Past Medical History  HTN, CVA - 2014 and 04/2018  Significant Hospital Events   3/31 Admit, 2L, bilateral patchy infiltrates  4/01 PCCM consulted 4/02 Intubated 4/03 toclizamab x 1 4/05 Net positive, remains on paralytic, still prone 4/06 Supine, plateau pressure 23, 50% / 12 PEEP; complete 5 days of plaquenil 4/08 Plateau 23, PEEP 12 / 40% 4/14 transfer to floor bed 4/17 back to ICU, intubated. [facility] moved to [facility]  Consults:    Procedures:  ETT 4/2 >> 4/12 ETT 4/17 >>  LUE PICC 4/2 >>    Significant Diagnostic Tests:  LDH 3/31 360  Micro Data:  BCx2 3/31 >> granulicatella adiacens 1/2  COVID 3/31 >> positive  RVP 4/1 >> negative Trach asp 4/2 >> oral flora COVID trach asp 4/2 >> Positive  Quantiferon Gold 4/6 >> negative Tracheal aspirate 4/17 > staph aureus  Antimicrobials:  Azithro 3/31 >> 4/4 Rocephin 3/31 >> 4/4 Unasyn 4/17 Cefepime 4/17 > 4/18 Vanco 4/17 >  Interim history/subjective:  Weaning very well this AM No events overnight   Objective   Blood pressure 139/83, pulse 94, temperature 100.3 F (37.9 C), temperature source Axillary, resp. rate 15, height 5\' 7"  (1.702 m), weight 103.7 kg, SpO2 100 %.    Vent Mode: PSV;CPAP FiO2 (%):  [40 %] 40 % Set Rate:  [24 bmp] 24 bmp Vt Set:  [530 mL] 530 mL PEEP:  [5 cmH20] 5 cmH20 Pressure Support:  [10 cmH20-12 cmH20] 12 cmH20 Plateau Pressure:  [18 cmH20-21 cmH20] 19 cmH20   Intake/Output Summary (Last 24  hours) at 03/31/2019 0905 Last data filed at 03/31/2019 0700 Gross per 24 hour  Intake 2880 ml  Output 875 ml  Net 2005 ml   Filed Weights   03/30/19 0500 03/30/19 2317 03/31/19 0412  Weight: 90.1 kg 103.7 kg 103.7 kg   Physical Exam:   General:  Acutely ill appearing male, NAD, on vent HENT: [place]/AT, PERRL, EOM-I and MMM PULM: CTA bilaterally but diminished CV: RRR, Nl S1/S2 and -M/R/G GI: Soft, NT, ND and +BS MSK: generally weak, some tone in arms, legs Neuro: Moving all ext to noxious stimuli  I reviewed CXR myself, infiltrate noted with ETT in a good position  Resolved Hospital Problem list   Hypotension, Respiratory Alkalosis, ARDS, Acute metabolic encephalopathy, Acute hypoxic respiratory failure, COVID PNA  Assessment & Plan:   Acute respiratory failure with ARDS:  - Attempt extubation today  - If fails then will reintubate and proceed with trach  - Titrate O2 for sat of 88-92%  - PT evaluation  - OB to chair as tolerated  - SLP  Hemodynamics:  - Tele monitoring   - D/C pressors  Sedation:  - D/C all sedation  - PRN fentanyl for pain  Best practice:  Diet: NPO DVT prophylaxis: lovenox  GI prophylaxis: n/a  Mobility: as tolerated  Code Status: Full Code Disposition: ICU  Labs    CMP Latest Ref Rng & Units 03/31/2019 03/30/2019  03/29/2019  Glucose 70 - 99 mg/dL 161(W196(H) 960(A212(H) 540(J212(H)  BUN 8 - 23 mg/dL 81(X39(H) 91(Y52(H) 78(G46(H)  Creatinine 0.61 - 1.24 mg/dL 9.561.04 2.13(Y1.41(H) 8.65(H1.61(H)  Sodium 135 - 145 mmol/L 149(H) 149(H) 151(H)  Potassium 3.5 - 5.1 mmol/L 3.2(L) 3.1(L) 3.5  Chloride 98 - 111 mmol/L 117(H) 115(H) 115(H)  CO2 22 - 32 mmol/L 25 25 24   Calcium 8.9 - 10.3 mg/dL 8.0(L) 8.4(L) 8.7(L)  Total Protein 6.5 - 8.1 g/dL - - 7.5  Total Bilirubin 0.3 - 1.2 mg/dL - - 0.9  Alkaline Phos 38 - 126 U/L - - 103  AST 15 - 41 U/L - - 27  ALT 0 - 44 U/L - - 28   CBC Latest Ref Rng & Units 03/31/2019 03/30/2019 03/29/2019  WBC 4.0 - 10.5 K/uL 6.0 7.3 8.2  Hemoglobin 13.0 -  17.0 g/dL 8.4(O9.1(L) 10.5(L) 12.2(L)  Hematocrit 39.0 - 52.0 % 31.5(L) 35.5(L) 41.9  Platelets 150 - 400 K/uL 139(L) PLATELET CLUMPS NOTED ON SMEAR, COUNT APPEARS ADEQUATE 166   CBG (last 3)  Recent Labs    03/30/19 2355 03/31/19 0312 03/31/19 0750  GLUCAP 177* 177* 204*   The patient is critically ill with multiple organ systems failure and requires high complexity decision making for assessment and support, frequent evaluation and titration of therapies, application of advanced monitoring technologies and extensive interpretation of multiple databases.   Critical Care Time devoted to patient care services described in this note is  42  Minutes. This time reflects time of care of this signee Dr Koren BoundWesam [place]. This critical care time does not reflect procedure time, or teaching time or supervisory time of PA/NP/Med student/Med Resident etc but could involve care discussion time.  Alyson ReedyWesam G. Khallid Pasillas, M.D. [facility]. Pager: [phone]. After hours pager: [phone].

## 2019-03-31 NOTE — Progress Notes (Signed)
Spoke with ex wife, Bronson Ing, via phone call.  Discussed updates, goals and plan of care.  All questions were answered.

## 2019-03-31 NOTE — Progress Notes (Signed)
PROGRESS NOTE  Benjamin Cohen YIR:[id number] DOB: 1956/05/10 DOA: 03/11/2019  PCP: Maude Leriche, PA-C  Brief History/Interval Summary: 63 y/o M admitted 3/31 with reports of fever, cough, SOB and confusion. He was recently at a wedding mid March and after multiple family members were diagnosed with COVID including his wife. Developed worsening hypoxemia, respiratory distress 4/1 and PCCM consulted for evaluation. Intubated 4/2 with ARDS.Was extubated 03/23/2019, usually with no oxygen requirement.  Patient remained frail, aspirating.  He started going back on oxygen and ended up reintubated on 03/28/2019.   3/31 Admit, 2L, bilateral patchy infiltrates  4/01 PCCM consulted 4/02 Intubated 4/03 toclizamab x 1 4/05 Net positive, remains on paralytic, still prone 4/06 Supine, plateau pressure 23, 50% / 12 PEEP; complete 5 days of plaquenil 4/08 Plateau 23, PEEP 12 / 40% 4/14 transfer to floor bed 4/17 transfer to stepdown and ETT    Consultants: Pulmonology  Procedures: ETT  Antibiotics:  Rocephin and azithromycin 03/11/2019-03/15/2019  Hydroxychloroquine 03/12/2019-03/17/2019  Diflucan 03/28/2019-ongoing  Vancomycin and cefepime 4/17  Cefepime discontinued on 4/18  Subjective/Interval History: Patient intubated.  However sedation has been turned off.  He is awake.  Following commands.  Denies any complaints   Assessment/Plan:  Acute Hypoxic Resp. Failure due to Acute Covid 19 Viral Illness during the ongoing 2020 Covid 19 Pandemic/MRSA Pneumonia -Patient required intubation -He received Actemra -He was treated with Plaquenil x5 days -And was on room air once extubated, but he did have frailty, difficult to manage secretions and required increasing oxygen.  -Reintubated on 03/28/2019.  -Treat as healthcare acquired pneumonia, post viral pneumonia and aspiration.  -Antibiotics broadened to cefepime and vancomycin.  -Cultures from tracheal aspirate positive for MRSA.   Cefepime was discontinued.  Patient remains on vancomycin.  Patient also noted to be on fluconazole.  No clear reason for same.  No yeast noted in any of the cultures or UA. This was subsequently discontinued. Pulmonology following and managing ventilator.    Vent Mode: PSV;CPAP FiO2 (%):  [40 %] 40 % Set Rate:  [24 bmp] 24 bmp Vt Set:  [530 mL] 530 mL PEEP:  [5 cmH20] 5 cmH20 Pressure Support:  [10 JKK93-81 cmH20] 12 cmH20 Plateau Pressure:  [18 cmH20-21 cmH20] 19 cmH20     Component Value Date/Time   PHART 7.465 (H) 03/28/2019 1623   PCO2ART 33.7 03/28/2019 1623   PO2ART 86.2 03/28/2019 1623   HCO3 24.0 03/28/2019 1623   TCO2 22 04/29/2018 1755   ACIDBASEDEF 0.2 03/16/2019 2015   O2SAT 95.9 03/28/2019 1623    The treatment plan and use of medications and known side effects were discussed with patient/family, they were clearly explained that there is no proven definitive treatment for COVID-19 infection, any medications used here are based on published clinical articles/anecdotal data which are not peer-reviewed or randomized control trials.  Complete risks and long-term side effects are unknown, however in the best clinical judgment they seem to be of some clinical benefit rather than medical risks.  Patient/family agree with the treatment plan and want to receive the given medications.  Patient was subsequently given Actemra as well as hydroxychloroquine.  Essential hypertension: On amlodipine.  PRN hydralazine.  Blood pressure is reasonably well controlled.  History of a stroke: No new deficit.  He is on a statin and Plavix.  Acute kidney injury: Creatinine has been fluctuating.  Monitor urine output.  Renal function is stable.    Hypernatremia: Likely due to free water deficit.  Sodium peaked at 156.  Sodium at 149 today.  Continue to monitor.  Has been stable.  Continue free water flushes.    Hypokalemia Potassium 3.2 today.  Magnesium was 2.7 yesterday.  Potassium  will be repleted.    Type 2 diabetes: Continue to monitor CBGs.  No longer on dextrose infusion.  Continue Lantus SSI.    CBG appears to be reasonably well controlled.  No adjustments to insulin dosage today.  Normocytic anemia Patient has had a drop in hemoglobin.  No overt bleeding has been noted.  Could be due to dilution as well as blood draws.  Continue to monitor.    Nutrition On tube feeds.  Free water flushes.  DVT prophylaxis: Lovenox Code Status: Full code Family Communication: Patient's ex-wife is next of kin and making decisions. Disposition Plan: Unknown at this time.    DVT Prophylaxis: On Lovenox  Lab Results  Component Value Date   PLT 139 (L) 03/31/2019     PUD Prophylaxis: Famotidine  Code Status: Full code Family Communication: CCM to discuss with family Disposition Plan: Will remain in ICU.  Plan is for possible extubation later today.   Medications:  Scheduled: . amLODipine  5 mg Per Tube QAC breakfast  . atorvastatin  40 mg Per Tube q1800  . chlorhexidine gluconate (MEDLINE KIT)  15 mL Mouth Rinse BID  . clopidogrel  75 mg Per Tube QAC breakfast  . enoxaparin (LOVENOX) injection  40 mg Subcutaneous Q24H  . famotidine  20 mg Oral BID  . feeding supplement (PRO-STAT SUGAR FREE 64)  60 mL Per Tube Q24H  . free water  300 mL Per Tube Q6H  . insulin aspart  0-15 Units Subcutaneous Q4H  . insulin aspart  3 Units Subcutaneous Q4H  . insulin glargine  20 Units Subcutaneous Daily  . mouth rinse  15 mL Mouth Rinse 10 times per day  . nutrition supplement (JUVEN)  1 packet Per Tube BID BM  . potassium chloride  40 mEq Per Tube Q4H  . sodium chloride flush  10-40 mL Intracatheter Q12H  . sodium chloride flush  3 mL Intravenous Q12H   Continuous: . feeding supplement (OSMOLITE 1.5 CAL) 1,000 mL (03/31/19 0414)  . vancomycin Stopped (03/30/19 1647)   KXF:GHWEXHBZJIRCV (TYLENOL) oral liquid 160 mg/5 mL **OR** acetaminophen, bisacodyl, bisacodyl,  docusate, hydrALAZINE, HYDROmorphone (DILAUDID) injection, HYDROmorphone (DILAUDID) injection, ipratropium, labetalol, metoprolol tartrate, midazolam, midazolam, [DISCONTINUED] ondansetron **OR** ondansetron (ZOFRAN) IV, oxyCODONE, polyethylene glycol, sodium chloride flush   Objective:  Vital Signs  Vitals:   03/31/19 0630 03/31/19 0700 03/31/19 0750 03/31/19 0800  BP: 96/71 97/72 124/74 139/83  Pulse:   94   Resp:   15   Temp:      TempSrc:      SpO2: 98% 99% 100% 100%  Weight:      Height:        Intake/Output Summary (Last 24 hours) at 03/31/2019 1006 Last data filed at 03/31/2019 0700 Gross per 24 hour  Intake 2475 ml  Output 875 ml  Net 1600 ml   Filed Weights   03/30/19 0500 03/30/19 2317 03/31/19 0412  Weight: 90.1 kg 103.7 kg 103.7 kg    General appearance: Intubated.  Awake on the vent.  Following commands. Head: Normocephalic, without obvious abnormality, atraumatic Resp: Intubated.  Coarse breath sounds.  Good air entry at the bases.  No wheezing or rhonchi.  Few crackles. Cardio: regular rate and rhythm, S1, S2 normal, no murmur, click, rub or gallop GI: soft, non-tender; bowel sounds  normal; no masses,  no organomegaly Extremities: extremities normal, atraumatic, no cyanosis or edema Pulses: 2+ and symmetric Neurologic: Following commands.  Moving all his extremities.  Lab Results:  Data Reviewed: I have personally reviewed following labs and imaging studies  CBC: Recent Labs  Lab 03/27/19 0500 03/28/19 0613 03/29/19 0434 03/30/19 0310 03/31/19 0500  WBC 6.6 7.0 8.2 7.3 6.0  HGB 12.2* 12.3* 12.2* 10.5* 9.1*  HCT 43.2 41.3 41.9 35.5* 31.5*  MCV 92.1 90.8 89.7 89.9 90.5  PLT 220 164 166 PLATELET CLUMPS NOTED ON SMEAR, COUNT APPEARS ADEQUATE 139*    Basic Metabolic Panel: Recent Labs  Lab 03/25/19 0511 03/26/19 0823  03/27/19 0648 03/28/19 0613 03/29/19 0434 03/30/19 0310 03/31/19 0500  NA 154* 156*   < > 153* 149* 151* 149* 149*  K 3.4*  3.2*   < > 3.9 3.7 3.5 3.1* 3.2*  CL 119* 123*   < > 121* 117* 115* 115* 117*  CO2 26 25   < > 24 24 24 25 25   GLUCOSE 197* 189*   < > 317* 362* 212* 212* 196*  BUN 43* 42*   < > 40* 32* 46* 52* 39*  CREATININE 1.04 1.17   < > 1.40* 1.14 1.61* 1.41* 1.04  CALCIUM 9.3 9.3   < > 8.6* 8.6* 8.7* 8.4* 8.0*  MG 2.9* 3.0*  --   --  2.4  --  2.7*  --   PHOS  --  4.7*  --   --   --   --   --   --    < > = values in this interval not displayed.    GFR: Estimated Creatinine Clearance: 81.2 mL/min (by C-G formula based on SCr of 1.04 mg/dL).  Liver Function Tests: Recent Labs  Lab 03/26/19 0823 03/28/19 0613 03/29/19 0434  AST 26 16 27   ALT 38 26 28  ALKPHOS 99 99 103  BILITOT 1.4* 0.8 0.9  PROT 7.4 7.2 7.5  ALBUMIN 3.5 3.2* 3.2*    CBG: Recent Labs  Lab 03/30/19 1620 03/30/19 1936 03/30/19 2355 03/31/19 0312 03/31/19 0750  GLUCAP 249* 233* 177* 177* 204*     Recent Results (from the past 240 hour(s))  Culture, respiratory (non-expectorated)     Status: None   Collection Time: 03/28/19  3:58 PM  Result Value Ref Range Status   Specimen Description   Final    TRACHEAL ASPIRATE Performed at [facility], [place] [address], [place], [place] [address]    Special Requests   Final    NONE Performed at [facility], [place] [address], [place], [place] [address]    Gram Stain   Final    ABUNDANT WBC PRESENT,BOTH PMN AND MONONUCLEAR ABUNDANT GRAM POSITIVE COCCI IN PAIRS Performed at [facility], [place] [address] [place], [place] [address]    Culture   Final    MODERATE METHICILLIN RESISTANT STAPHYLOCOCCUS AUREUS   Report Status 03/30/2019 FINAL  Final   Organism ID, Bacteria METHICILLIN RESISTANT STAPHYLOCOCCUS AUREUS  Final      Susceptibility   Methicillin resistant staphylococcus aureus - MIC*    CIPROFLOXACIN >=8 RESISTANT Resistant     ERYTHROMYCIN >=8 RESISTANT Resistant     GENTAMICIN <=0.5 SENSITIVE Sensitive     OXACILLIN  >=4 RESISTANT Resistant     TETRACYCLINE <=1 SENSITIVE Sensitive     VANCOMYCIN 1 SENSITIVE Sensitive     TRIMETH/SULFA <=10 SENSITIVE Sensitive     CLINDAMYCIN <=0.25  SENSITIVE Sensitive     RIFAMPIN <=0.5 SENSITIVE Sensitive     Inducible Clindamycin NEGATIVE Sensitive     * MODERATE METHICILLIN RESISTANT STAPHYLOCOCCUS AUREUS      Radiology Studies: Dg Chest Port 1 View  Result Date: 03/30/2019 CLINICAL DATA:  63 y/o M; COVID-19 positive. Acute respiratory failure. EXAM: PORTABLE CHEST 1 VIEW COMPARISON:  None. FINDINGS: Endotracheal tube tip 3.3 cm above the carina. Enteric tube tip is below the field of view in the abdomen. Patchy opacities in the lung bases. No pleural effusion or pneumothorax. No acute osseous abnormality is evident. IMPRESSION: Endotracheal tube tip 3.3 cm above the carina. Enteric tube tip is below the field of view in the abdomen. Patchy opacities in the lung bases compatible with pneumonia including atypical and viral pneumonia. Electronically Signed   By: Kristine Garbe M.D.   On: 03/30/2019 04:19       LOS: 20 days   [facility] Pager on [email or web address]  03/31/2019, 10:06 AM

## 2019-03-31 NOTE — Evaluation (Signed)
Physical Therapy Re-Evaluation Patient Details Name: Benjamin Cohen MRN: [id number] DOB: [birth year] Today's Date: 03/31/2019   History of Present Illness  63 y.o. male admitted for acute respiratory failure (+) COVID 19, s/p intubation 4/2-4/11, re-intubated 4/17-4/20/20.  Pt with significant PMH of CVA and HTN.    Clinical Impression  Pt is very weak and deconditioned, but he tolerated EOB today with 2 L O2 [place], a short run of V-tach which resolved with repositioning back in bed.  He was able to sit up EOB with BPs and O2 sats stable.  He keeps removing his O2 2L and remained stable on RA for short periods of time until O2 could be placed back.  He is very weak.  I spoke to his x-wife Benjamin Cohen who [facility].  She is interested to see if he is an intensive therapy candidate.  PT to follow acutely for deficits listed below.      Follow Up Recommendations CIR    Equipment Recommendations  Rolling walker with 5" wheels;3in1 (PT);Wheelchair (measurements PT);Wheelchair cushion (measurements PT)    Recommendations for Other Services Rehab consult     Precautions / Restrictions Precautions Precautions: Fall      Mobility  Bed Mobility Overal bed mobility: Needs Assistance Bed Mobility: Supine to Sit;Sit to Supine     Supine to sit: Mod assist;HOB elevated Sit to supine: Mod assist;HOB elevated   General bed mobility comments: One person heavy mod assist to come to sitting EOB.  Pt with flexed trunk posture, bed utilized to assist with transitions.    Transfers                 General transfer comment: too weak to attempt standing today.         Balance Overall balance assessment: Needs assistance Sitting-balance support: Feet supported;Bilateral upper extremity supported Sitting balance-Leahy Scale: Poor Sitting balance - Comments: mod assist EOB, cues to hold his head up.  VSS for ~8 mins, then he had a run of V-tach and I  repositioned him in supine.  All other VSS on 2 L O2 [place].  Pt sounded like he needed to cough, but he was unable to cough with cues.  Postural control: (flexed trunk EOB)                                   Pertinent Vitals/Pain Pain Assessment: Faces Faces Pain Scale: Hurts little more Pain Location: generalized Pain Descriptors / Indicators: Sore Pain Intervention(s): Limited activity within patient's tolerance;Repositioned;Monitored during session    Home Living Family/patient expects to be discharged to:: Private residence Living Arrangements: Children(with son who is 65 yo) Available Help at Discharge: Available 24 hours/day;Family Type of Home: House Home Access: Stairs to enter Entrance Stairs-Rails: Doctor, general practiceight;Left Entrance Stairs-Number of Steps: 5(steps on the walkway 1 (no rail), 1 (no rail), 4 (bil rails)) Home Layout: Two level;1/2 bath on main level Home Equipment: None Additional Comments: [facility] [name], x-wife    Prior Function Level of Independence: Independent         Comments: Drives; 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   LLE Deficits / Details: seems equally weak bil LEs, ROM intact, ankles at least 3/5, knee 3-/5 (seated LAQ) , hip flexion 2/5.      Cervical / Trunk Assessment Cervical / Trunk Assessment: Other exceptions Cervical / Trunk Exceptions: flexed trunk EOB, difficulty holding head erect.   Communication   Communication: Expressive difficulties(low tone, hushed voice)  Cognition Arousal/Alertness:  Awake/alert Behavior During Therapy: Flat affect Overall Cognitive Status: Impaired/Different from baseline Area of Impairment: Orientation;Attention;Memory;Following commands;Safety/judgement;Awareness;Problem solving                 Orientation Level: Disoriented to;Place;Time;Situation Current Attention Level: Sustained Memory: Decreased short-term memory Following Commands: Follows one step commands consistently;Follows one step commands with increased time Safety/Judgement: Decreased awareness of safety;Decreased awareness of deficits Awareness: Intellectual Problem Solving: Slow processing General Comments: Follows one step commands with increased processing time, keeps pulling at his mask and O2 tube in his nose.        General Comments General comments (skin integrity, edema, etc.): See vitals flow sheet for vitals.      Exercises General Exercises - Upper Extremity Shoulder Flexion: AAROM;Both;10 reps Elbow Flexion: AAROM;Both;10 reps General Exercises - Lower Extremity Ankle Circles/Pumps: AAROM;Both;20 reps;Supine Short Arc Quad: AAROM;Both;10 reps;Supine Long Arc Quad: AROM;Both;5 reps;Seated Hip ABduction/ADduction: AAROM;Both;10 reps;Supine   Assessment/Plan    PT Assessment Patient needs continued PT services  PT Problem List Decreased strength;Decreased activity tolerance;Decreased balance;Decreased mobility;Decreased cognition;Decreased knowledge of use of DME;Decreased safety awareness;Decreased knowledge of precautions;Cardiopulmonary status limiting activity       PT Treatment Interventions DME instruction;Gait training;Stair training;Functional mobility training;Therapeutic activities;Therapeutic exercise;Balance training;Patient/family education;Cognitive remediation    PT Goals (Current goals can be found in the Care Plan section)  Acute Rehab PT Goals Patient Stated Goal: pt agreeable to PT, confused, restless, x-wife [name] would like to see if  he could go to intensive therapy before d/c home.   PT Goal Formulation: With family Time For Goal Achievement: 04/14/19 Potential to Achieve Goals: Good    Frequency Min 3X/week           AM-PAC PT "6 Clicks" Mobility  Outcome Measure Help needed turning from your back to your side while in a flat bed without using bedrails?: A Lot Help needed moving from lying on your back to sitting on the side of a flat bed without using bedrails?: A Lot Help needed moving to and from a bed to a chair (including a wheelchair)?: Total Help needed standing up from a chair using your arms (e.g., wheelchair or bedside chair)?: Total Help needed to walk in hospital room?: Total Help needed climbing 3-5 steps with a railing? : Total 6 Click Score: 8    End of Session Equipment Utilized During Treatment: Oxygen(2 L O2 [place]) Activity Tolerance: Patient limited by fatigue;Other (comment)(limited by cognition) Patient left: in bed;Other (comment)(bed in chair mode) Nurse Communication: Mobility status PT Visit Diagnosis: Difficulty in walking, not elsewhere classified (R26.2);Muscle weakness (generalized) (M62.81)    Time: 9407-6808 PT Time Calculation (min) (ACUTE ONLY): 20 min   Charges:         Lurena Joiner B. Jeramey Lanuza, PT, DPT  Acute Rehabilitation #[phone] pager #[phone] office   PT Evaluation $PT Re-evaluation: 1 Re-eval PT Treatments $Therapeutic Activity: 8-22 mins       03/31/2019, 6:18 PM

## 2019-04-01 ENCOUNTER — Inpatient Hospital Stay (HOSPITAL_COMMUNITY): Payer: 59

## 2019-04-01 DIAGNOSIS — R410 Disorientation, unspecified: Secondary | ICD-10-CM

## 2019-04-01 LAB — GLUCOSE, CAPILLARY
Glucose-Capillary: 101 mg/dL — ABNORMAL HIGH (ref 70–99)
Glucose-Capillary: 104 mg/dL — ABNORMAL HIGH (ref 70–99)
Glucose-Capillary: 111 mg/dL — ABNORMAL HIGH (ref 70–99)
Glucose-Capillary: 112 mg/dL — ABNORMAL HIGH (ref 70–99)
Glucose-Capillary: 131 mg/dL — ABNORMAL HIGH (ref 70–99)
Glucose-Capillary: 167 mg/dL — ABNORMAL HIGH (ref 70–99)

## 2019-04-01 LAB — CBC
HCT: 30.9 % — ABNORMAL LOW (ref 39.0–52.0)
Hemoglobin: 9.3 g/dL — ABNORMAL LOW (ref 13.0–17.0)
MCH: 27.1 pg (ref 26.0–34.0)
MCHC: 30.1 g/dL (ref 30.0–36.0)
MCV: 90.1 fL (ref 80.0–100.0)
Platelets: 152 10*3/uL (ref 150–400)
RBC: 3.43 MIL/uL — ABNORMAL LOW (ref 4.22–5.81)
RDW: 13.4 % (ref 11.5–15.5)
WBC: 5.6 10*3/uL (ref 4.0–10.5)
nRBC: 0 % (ref 0.0–0.2)

## 2019-04-01 LAB — VITAMIN B12: Vitamin B-12: 848 pg/mL (ref 180–914)

## 2019-04-01 LAB — COMPREHENSIVE METABOLIC PANEL
ALT: 29 U/L (ref 0–44)
AST: 26 U/L (ref 15–41)
Albumin: 2.6 g/dL — ABNORMAL LOW (ref 3.5–5.0)
Alkaline Phosphatase: 101 U/L (ref 38–126)
Anion gap: 7 (ref 5–15)
BUN: 21 mg/dL (ref 8–23)
CO2: 26 mmol/L (ref 22–32)
Calcium: 8.2 mg/dL — ABNORMAL LOW (ref 8.9–10.3)
Chloride: 116 mmol/L — ABNORMAL HIGH (ref 98–111)
Creatinine, Ser: 0.79 mg/dL (ref 0.61–1.24)
GFR calc Af Amer: >60 mL/min (ref >60)
GFR calc non Af Amer: >60 mL/min (ref >60)
Glucose, Bld: 125 mg/dL — ABNORMAL HIGH (ref 70–99)
Potassium: 3.6 mmol/L (ref 3.5–5.1)
Sodium: 149 mmol/L — ABNORMAL HIGH (ref 135–145)
Total Bilirubin: 0.7 mg/dL (ref 0.3–1.2)
Total Protein: 6.8 g/dL (ref 6.5–8.1)

## 2019-04-01 LAB — IRON AND TIBC
Iron: 22 ug/dL — ABNORMAL LOW (ref 45–182)
Saturation Ratios: 10 % — ABNORMAL LOW (ref 17.9–39.5)
TIBC: 220 ug/dL — ABNORMAL LOW (ref 250–450)
UIBC: 198 ug/dL

## 2019-04-01 LAB — RETICULOCYTES
Immature Retic Fract: 16.4 % — ABNORMAL HIGH (ref 2.3–15.9)
RBC.: 3.43 MIL/uL — ABNORMAL LOW (ref 4.22–5.81)
Retic Count, Absolute: 33.3 10*3/uL (ref 19.0–186.0)
Retic Ct Pct: 1 % (ref 0.4–3.1)

## 2019-04-01 LAB — FERRITIN: Ferritin: 371 ng/mL — ABNORMAL HIGH (ref 24–336)

## 2019-04-01 LAB — MAGNESIUM: Magnesium: 2.5 mg/dL — ABNORMAL HIGH (ref 1.7–2.4)

## 2019-04-01 LAB — FOLATE: Folate: 13.1 ng/mL (ref >5.9)

## 2019-04-01 LAB — VANCOMYCIN, RANDOM: Vancomycin Rm: 9

## 2019-04-01 MED ORDER — OSMOLITE 1.5 CAL PO LIQD
1000.0000 mL | ORAL | Status: DC
  Administered 2019-04-01: 1000 mL

## 2019-04-01 MED ORDER — VANCOMYCIN HCL IN DEXTROSE 1-5 GM/200ML-% IV SOLN
1000.0000 mg | Freq: Two times a day (BID) | INTRAVENOUS | Status: AC
  Administered 2019-04-01 – 2019-04-03 (×6): 1000 mg via INTRAVENOUS
  Filled 2019-04-01 (×6): qty 200

## 2019-04-01 MED ORDER — POTASSIUM CHLORIDE 10 MEQ/100ML IV SOLN
10.0000 meq | INTRAVENOUS | Status: AC
  Administered 2019-04-01 (×3): 10 meq via INTRAVENOUS
  Filled 2019-04-01: qty 100

## 2019-04-01 MED ORDER — HALOPERIDOL LACTATE 5 MG/ML IJ SOLN
2.0000 mg | Freq: Four times a day (QID) | INTRAMUSCULAR | Status: DC | PRN
  Administered 2019-04-01: 22:00:00 2 mg via INTRAVENOUS
  Filled 2019-04-01: qty 1

## 2019-04-01 MED ORDER — PRO-STAT SUGAR FREE PO LIQD
30.0000 mL | Freq: Two times a day (BID) | ORAL | Status: DC
  Administered 2019-04-01: 21:00:00 30 mL
  Filled 2019-04-01 (×2): qty 30

## 2019-04-01 MED ORDER — DEXMEDETOMIDINE HCL IN NACL 400 MCG/100ML IV SOLN
0.2000 ug/kg/h | INTRAVENOUS | Status: DC
  Administered 2019-04-01: 23:00:00 0.2 ug/kg/h via INTRAVENOUS
  Administered 2019-04-02: 04:00:00 0.6 ug/kg/h via INTRAVENOUS
  Filled 2019-04-01: qty 100

## 2019-04-01 NOTE — Progress Notes (Signed)
Shift summary: patient's status upgraded to PCU/SDU from ICU. Precedex and restraints successfully discontinued. Flexi-seal out. PICC dressing changed. CHG bath completed. OT and SLP both saw patient today. Per SLP, patient to remain NPO for now. NGT placed and TFs initiated. Condom cath exchanged. 3 runs of IV KCl infused this shift. IV Vanco received this shift. Spoke with and updated patient's emergency contact and ex-wife, Bronson Ing.

## 2019-04-01 NOTE — Progress Notes (Signed)
Attempted to contact patient's emergency contact, Bronson Ing (ex-wife), for update. No answer. Will attempt to contact again later in the morning if time permits.

## 2019-04-01 NOTE — Progress Notes (Signed)
Occupational Therapy Evaluation Patient Details Name: Benjamin Cohen MRN: [id number] DOB: [birth year] Today's Date: 04/01/2019    History of Present Illness 63 y.o. male admitted for acute respiratory failure (+) COVID 19, s/p intubation 4/2-4/11, re-intubated 4/17-4/20/20.  Pt with significant PMH of CVA and HTN.     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 Pt currently requires overall Max A with ADL and Mod A +2 with sit - stand due to below listed deficits. Pt with flat affect, but demonstrates good participation. Pt's goal is to play golf again. VSS during session on RA. Short segment of V tach - unsure if artifact. At this time, recommend rehab at [facility]. Will follow acutely to facilitate safe DC to next venue of care.     Follow Up Recommendations  CIR;Supervision/Assistance - 24 hour    Equipment Recommendations  3 in 1 bedside commode    Recommendations for Other Services Rehab consult     Precautions / Restrictions Precautions Precautions: Fall Precaution Comments: rectal foley      Mobility Bed Mobility Overal bed mobility: Needs Assistance Bed Mobility: Supine to Sit;Sit to Supine Rolling: Mod assist Sidelying to sit: +2 for physical assistance;Max assist   Sit to supine: Mod assist;+2 for physical assistance      Transfers Overall transfer level: Needs assistance Equipment used: 2 person hand held assist Transfers: Sit to/from Stand Sit to Stand: +2 physical assistance;Mod assist              Balance Overall balance assessment: Needs assistance   Sitting balance-Leahy Scale: Poor Sitting balance - Comments: L bias     Standing balance-Leahy Scale: Poor Standing balance comment: unable to achieve full upright standing                           ADL either performed or assessed with clinical judgement   ADL Overall ADL's : Needs assistance/impaired Eating/Feeding:  NPO   Grooming: Maximal assistance Grooming Details (indicate cue type and reason): difficulty holding onto items; Able to use B hands to wash face with support to R elbow Upper Body Bathing: Moderate assistance;Sitting   Lower Body Bathing: Maximal assistance;Bed level   Upper Body Dressing : Total assistance;Sitting   Lower Body Dressing: Total assistance;Sit to/from stand               Functional mobility during ADLs: +2 for physical assistance;Moderate assistance General ADL Comments: Able to stand with +2 bearing weight through BLE; Unable to achieve full upright standing at this time. Once standing, pt requiring less assistance; L bias     Vision   Additional Comments: Will further Assess     Perception     Praxis      Pertinent Vitals/Pain Pain Assessment: Faces Faces Pain Scale: Hurts little more Pain Location: generalized Pain Descriptors / Indicators: Sore Pain Intervention(s): Limited activity within patient's tolerance     Hand Dominance Right   Extremity/Trunk Assessment Upper Extremity Assessment Upper Extremity Assessment: Generalized weakness RUE Deficits / Details: generalized weakness; unable to bring hand to mouth due to weakness; poor in hand manipulation skills RUE Coordination: decreased fine motor;decreased gross motor LUE Deficits / Details: Appeasr weakenr than R; unable to reach hand to mouth; poor in-hand manipulation skills; able to hold washcloth LUE Coordination: decreased fine motor;decreased gross motor   Lower Extremity Assessment Lower Extremity Assessment: Defer to PT  evaluation   Cervical / Trunk Assessment Cervical / Trunk Assessment: Kyphotic;Other exceptions(forward head; L lateral lean)   Communication Communication Communication: Expressive difficulties   Cognition Arousal/Alertness: Awake/alert Behavior During Therapy: Flat affect Overall Cognitive Status: Impaired/Different from baseline Area of Impairment:  Orientation;Attention;Safety/judgement;Awareness;Problem solving                 Orientation Level: Disoriented to;Time;Place;Situation Current Attention Level: Sustained Memory: Decreased recall of precautions Following Commands: Follows one step commands with increased time Safety/Judgement: Decreased awareness of safety;Decreased awareness of deficits Awareness: Emergent Problem Solving: Slow processing General Comments: Delay in processing; appears improved when comparing notes from previous PT session; Pt began engaging in conversation when talking about golf; trying to cover himself up   General Comments       Exercises Other Exercises Other Exercises: General BUE AROM/strengthening; shoulder FF x 5; Elbow flex/exte x 10   Shoulder Instructions      Home Living Family/patient expects to be discharged to:: Private residence Living Arrangements: Children Available Help at Discharge: Available 24 hours/day;Family Type of Home: House Home Access: Stairs to enter Secretary/administratorntrance Stairs-Number of Steps: 5 Entrance Stairs-Rails: Right;Left Home Layout: Two level;1/2 bath on main level Alternate Level Stairs-Number of Steps: flight Alternate Level Stairs-Rails: Right Bathroom Shower/Tub: Chief Strategy OfficerTub/shower unit   Bathroom Toilet: Standard Bathroom Accessibility: Yes How Accessible: Accessible via walker Home Equipment: None          Prior Functioning/Environment Level of Independence: Independent        Comments: Drives; 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); likes to fish; enjoys playing golf        OT Problem List: Decreased strength;Decreased range of motion;Decreased activity tolerance;Impaired balance (sitting and/or standing);Decreased coordination;Decreased cognition;Decreased safety awareness;Decreased knowledge of use of DME or AE;Cardiopulmonary status limiting activity;Impaired UE functional use;Pain      OT Treatment/Interventions:  Self-care/ADL training;Therapeutic exercise;Neuromuscular education;DME and/or AE instruction;Energy conservation;Cognitive remediation/compensation;Therapeutic activities;Patient/family education;Balance training    OT Goals(Current goals can be found in the care plan section) Acute Rehab OT Goals Patient Stated Goal: pt play golf OT Goal Formulation: With patient Time For Goal Achievement: 04/15/19 Potential to Achieve Goals: Good  OT Frequency: Min 3X/week   Barriers to D/C:            Co-evaluation              AM-PAC OT "6 Clicks" Daily Activity     Outcome Measure Help from another person eating meals?: Total Help from another person taking care of personal grooming?: A Lot Help from another person toileting, which includes using toliet, bedpan, or urinal?: Total Help from another person bathing (including washing, rinsing, drying)?: A Lot Help from another person to put on and taking off regular upper body clothing?: A Lot Help from another person to put on and taking off regular lower body clothing?: Total 6 Click Score: 9   End of Session Nurse Communication: Mobility status  Activity Tolerance: Patient tolerated treatment well Patient left: in bed;with call bell/phone within reach;with nursing/sitter in room  OT Visit Diagnosis: Other abnormalities of gait and mobility (R26.89);Muscle weakness (generalized) (M62.81);Pain;Other symptoms and signs involving cognitive function Pain - part of body: (genealized weakness)                Time: 1240-1320 OT Time Calculation (min): 40 min Charges:  OT General Charges $OT Visit: 1 Visit OT Evaluation $OT Eval High Complexity: 1 High OT Treatments $Self Care/Home Management : 8-22  mins $Therapeutic Activity: 8-22 mins  Luisa Dago, OT/L   Acute OT Clinical Specialist Acute Rehabilitation Services Pager [phone] Office [phone]   [facility] 04/01/2019, 2:52 PM

## 2019-04-01 NOTE — Evaluation (Signed)
Clinical/Bedside Swallow Evaluation Patient Details  Name: Benjamin Cohen MRN: [id number] Date of Birth: [birth year]  Today's Date: 04/01/2019 Time:        Past Medical History:  Past Medical History:  Diagnosis Date  . Hypertension   . Stroke [facility])    Past Surgical History:  Past Surgical History:  Procedure Laterality Date  . NO PAST SURGERIES     HPI:  63 y/o M admitted 3/31 COVID (+).Developed worsening hypoxemia, respiratory distress 4/1 and PCCM consulted for evaluation. Intubated 4/2 with ARDS.Was extubated 03/23/2019, with no oxygen requirement. Initial clinical swallow evaluation 4/13 revealed concerns for severe dysphagia; NPO was recommended, TF.  Pt developed respiratory distress and was reintubated on 03/28/2019; extubated 4/20. Marland Kitchen    Assessment / Plan / Recommendation Clinical Impression  Pt presents with a likely severe dysphagia with multiple issues predisposing him to aspiration.  Cough, phonation are weak, suggesting glottal incompetence. He demonstrates multiple (5-6) subswallows with each bolus, concerning for inadequate bolus transfer and accumulation of residue.  He continues to have some difficulty with secretion management.  Anticipate pt will need some time and therapy before he is ready for an oral diet.  Recommend NPO;  enteral feeding. SLP will follow for PO readiness, therapeutic intervention as pt is able.   SLP Visit Diagnosis: Dysphagia, unspecified (R13.10)    Aspiration Risk  Severe aspiration risk;Risk for inadequate nutrition/hydration    Diet Recommendation   NPO       Other  Recommendations Oral Care Recommendations: Oral care QID   Follow up Recommendations        Frequency and Duration min 3x week  2 weeks       Prognosis Prognosis for Safe Diet Advancement: Guarded Barriers to Reach Goals: Cognitive deficits;Time post onset;Severity of deficits      Swallow Study   General HPI: 63 y/o M admitted 3/31 COVID (+).Developed  worsening hypoxemia, respiratory distress 4/1 and PCCM consulted for evaluation. Intubated 4/2 with ARDS.Was extubated 03/23/2019, with no oxygen requirement. Initial clinical swallow evaluation 4/13 revealed concerns for severe dysphagia; NPO was recommended, TF.  Pt developed respiratory distress and was reintubated on 03/28/2019; extubated 4/20. .  Type of Study: Bedside Swallow Evaluation Previous Swallow Assessment: 03/24/19 Diet Prior to this Study: NPO Temperature Spikes Noted: No Respiratory Status: Room air History of Recent Intubation: Yes Length of Intubations (days): 14 days(over two intubations) Date extubated: 03/31/19 Behavior/Cognition: Alert;Cooperative;Distractible Oral Cavity Assessment: Within Functional Limits Oral Care Completed by SLP: Recent completion by staff Oral Cavity - Dentition: Adequate natural dentition Self-Feeding Abilities: Needs assist Patient Positioning: Upright in bed Baseline Vocal Quality: Low vocal intensity;Breathy Volitional Cough: Weak Volitional Swallow: Able to elicit    Oral/Motor/Sensory Function Overall Oral Motor/Sensory Function: Generalized oral weakness(asymmetry attributable to prior stroke)   [place] chips: Impaired Presentation: Spoon Pharyngeal Phase Impairments: Multiple swallows;Suspected delayed Swallow;Cough - Immediate   Thin Liquid Thin Liquid: Impaired Presentation: Spoon Pharyngeal  Phase Impairments: Cough - Immediate    Nectar Thick Nectar Thick Liquid: Not tested   Honey Thick Honey Thick Liquid: Not tested   Puree Puree: Not tested   Solid     Solid: Not tested      [name] [name] 04/01/2019,1:34 PM   [name] L. [name], MA CCC/SLP Acute Rehabilitation Services Office number [phone] Pager [phone]

## 2019-04-01 NOTE — Plan of Care (Signed)
  Problem: Health Behavior/Discharge Planning: Goal: Ability to manage health-related needs will improve Outcome: Progressing   Problem: Clinical Measurements: Goal: Ability to maintain clinical measurements within normal limits will improve Outcome: Progressing Goal: Will remain free from infection Outcome: Progressing Goal: Diagnostic test results will improve Outcome: Progressing Goal: Respiratory complications will improve Outcome: Progressing Goal: Cardiovascular complication will be avoided Outcome: Progressing   Problem: Activity: Goal: Risk for activity intolerance will decrease Outcome: Progressing   Problem: Nutrition: Goal: Adequate nutrition will be maintained Outcome: Progressing   Problem: Coping: Goal: Level of anxiety will decrease Outcome: Progressing   Problem: Elimination: Goal: Will not experience complications related to bowel motility Outcome: Progressing Goal: Will not experience complications related to urinary retention Outcome: Progressing   Problem: Safety: Goal: Ability to remain free from injury will improve Outcome: Progressing   Problem: Skin Integrity: Goal: Risk for impaired skin integrity will decrease Outcome: Progressing   Problem: Education: Goal: Knowledge of General Education information will improve Description Including pain rating scale, medication(s)/side effects and non-pharmacologic comfort measures Outcome: Progressing   Problem: Health Behavior/Discharge Planning: Goal: Ability to manage health-related needs will improve Outcome: Progressing   Problem: Clinical Measurements: Goal: Ability to maintain clinical measurements within normal limits will improve Outcome: Progressing Goal: Will remain free from infection Outcome: Progressing Goal: Diagnostic test results will improve Outcome: Progressing Goal: Respiratory complications will improve Outcome: Progressing Goal: Cardiovascular complication will be  avoided Outcome: Progressing   Problem: Activity: Goal: Risk for activity intolerance will decrease Outcome: Progressing   Problem: Nutrition: Goal: Adequate nutrition will be maintained Outcome: Progressing   Problem: Coping: Goal: Level of anxiety will decrease Outcome: Progressing   Problem: Elimination: Goal: Will not experience complications related to bowel motility Outcome: Progressing Goal: Will not experience complications related to urinary retention Outcome: Progressing   Problem: Safety: Goal: Ability to remain free from injury will improve Outcome: Progressing   Problem: Skin Integrity: Goal: Risk for impaired skin integrity will decrease Outcome: Progressing   Problem: Activity: Goal: Ability to tolerate increased activity will improve Outcome: Progressing   Problem: Respiratory: Goal: Ability to maintain a clear airway and adequate ventilation will improve Outcome: Progressing   Problem: Role Relationship: Goal: Method of communication will improve Outcome: Progressing

## 2019-04-01 NOTE — Progress Notes (Signed)
Pharmacy Antibiotic Note  Benjamin Cohen is a 63 y.o. male admitted on 03/11/2019 with acute respiratory failure, +SARS-CoV-2, and MRSA HAP.  Pharmacy has been consulted for vancomycin dosing.  Today, 04/01/19  Day 5 Vanc  SCr decreased to 0.79  Afebrile, Tm 99.1  WBC decreasing  Plan:  Increase to Vancomycin 1000 mg IV q12h.  Goal AUC = 400 - 500.  Expected AUC 508 with SCr 0.8.  Follow up renal function, culture results, and clinical course.  Daily SCr  Height: 5\' 7"  (170.2 cm) Weight: 229 lb 15 oz (104.3 kg) IBW/kg (Calculated) : 66.1  Temp (24hrs), Avg:99.1 F (37.3 C), Min:99 F (37.2 C), Max:99.1 F (37.3 C)  Recent Labs  Lab 03/28/19 0613 03/29/19 0434 03/30/19 0310 03/31/19 0500 03/31/19 2037 04/01/19 0304  WBC 7.0 8.2 7.3 6.0  --  5.6  CREATININE 1.14 1.61* 1.41* 1.04  --  0.79  VANCOPEAK  --   --   --   --  18*  --   VANCORANDOM  --   --   --   --   --  9    Estimated Creatinine Clearance: 106 mL/min (by C-G formula based on SCr of 0.79 mg/dL).    No Known Allergies  Antimicrobials this admission: 4/1 HCQ >>  4/6 3/31 azith>> 4/5 3/31 CTX>> 4/5 4/3 Actemra x 1 4/17 Fluconazole 100mg  IV q24 >>4/19 4/17 Unasyn>> 4/17 4/17 Vanc >> 4/17 Cefepime >> 4/18  Dose adjustments this admission: 4/20 Vanc level 1 = 18, vanc level 2 = 9; Calculated AUC 254.  Vanc dosing increased.  Microbiology results: 3/31 RVP neg 3/31 BCx2: 1/4 Bcx GPC, no result on BCID> likely contaminant - resulted Granulicatella adiacens 3/31 novel coronavirus NAA: detected 3/31 MRSA PCR neg 4/1 novel coronavirus NAA: detected 4/2 trach asp: normal flora- FINAL 4/1 IL-6: 117.1 elevated 4/1 resp panel pcr: neg 4/6 Quantiferon-TB Gold: negative 4/17: TA: Mod MRSA  Thank you for allowing pharmacy to be a part of this patient's care.  Lynann Beaver PharmD, BCPS 04/01/2019 7:49 AM

## 2019-04-01 NOTE — Progress Notes (Signed)
Rehab Admissions Coordinator Note:  Patient was screened by Clois Dupes for appropriateness for an Inpatient Acute Rehab Consult per PT recommendation. I will follow his progress to assist with planning dispo once able to tolerate more therapy. I will follow.  Clois Dupes RN MSN 04/01/2019, 2:23 PM  I can be reached at [phone].

## 2019-04-01 NOTE — Progress Notes (Addendum)
Nutrition Follow-up   RD working remotely.   DOCUMENTATION CODES:   Obesity unspecified  INTERVENTION:   Tube feeding:  Osmolite 1.5 at 65 ml/hr Pro-Stat 30 mL BID Provides 2540 kcals, 128 g of protein and 1186 mL of free water  Continue Juven BID, each packet provides 80 calories, 8 grams of carbohydrate, 2.5  grams of protein (collagen), 7 grams of L-arginine and 7 grams of L-glutamine; supplement contains CaHMB, Vitamins C, E, B12 and Zinc to promote wound healing    NUTRITION DIAGNOSIS:   Inadequate oral intake related to inability to eat as evidenced by NPO status.  Being addressed via nutrition support  GOAL:   Patient will meet greater than or equal to 90% of their needs  Progressing   MONITOR:   TF tolerance, Labs, Weight trends, Skin  REASON FOR ASSESSMENT:   Consult Enteral/tube feeding initiation and management  ASSESSMENT:   63 year-old male admitted 3/31 with reports of fever, cough, SOB and confusion.  He was recently at a wedding mid-March and after multiple family members were diagnosed with COVID-19. Patient had been found on the floor and unable to get for several days. He developed worsening hypoxemia, respiratory distress 4/1 and PCCM consulted for evaluation. CXR showed patchy bilateral infiltrates. He was screened for COVID in the ED on 3/31 and treated empirically for CAP.  3/31 Admit 4/02 Intubated 4/12 Extubated 4/17 Re-intubated 4/20 Extubated  Pt is very weak and deconditioned  SLP eval today with recommendations that pt remain NPO with alternative means for feeding. Plan for NG tube  Admission weight 90.2 kg; current wt 104.3 kg. Noted lowest weight this admission of 79 kg. Generalized non-pitting edema.   Noted pt with several wounds that were not present on admission   Labs: sodium 149 (H), Creatinine wdl Meds: reviewed  Diet Order:   Diet Order            Diet NPO time specified  Diet effective now               EDUCATION NEEDS:   Not appropriate for education at this time  Skin:  Skin Assessment: Skin Integrity Issues: Skin Integrity Issues:: DTI, Other (Comment) DTI: buttock, heel Stage II: R face (new 4/12) Other: skin tears, MASD  Last BM:  4/21  Height:   Ht Readings from Last 1 Encounters:  03/19/19 5\' 7"  (1.702 m)    Weight:   Wt Readings from Last 1 Encounters:  04/01/19 104.3 kg    Ideal Body Weight:  67.27 kg  BMI:  Body mass index is 36.01 kg/m.  Estimated Nutritional Needs:   Kcal:  2300-2500 kcal  Protein:  120-143 g  Fluid:  >/= 2.2 L/day   Romelle Starcher MS, RD, LDN, CNSC [phone] Pager  [phone] Weekend/On-Call Pager

## 2019-04-01 NOTE — Progress Notes (Signed)
PROGRESS NOTE  Benjamin Cohen ZOX:[id number] DOB: [birth year] DOA: 03/11/2019  PCP: Maude Leriche, PA-C  Brief History/Interval Summary: 63 y/o M admitted 3/31 with reports of fever, cough, SOB and confusion. He was recently at a wedding mid March and after multiple family members were diagnosed with COVID including his wife. Developed worsening hypoxemia, respiratory distress 4/1 and PCCM consulted for evaluation. Intubated 4/2 with ARDS.Was extubated 03/23/2019, usually with no oxygen requirement.  Patient remained frail, aspirating.  He started going back on oxygen and ended up reintubated on 03/28/2019.   3/31 Admit, 2L, bilateral patchy infiltrates  4/01 PCCM consulted 4/02 Intubated 4/03 toclizamab x 1 4/05 Net positive, remains on paralytic, still prone 4/06 Supine, plateau pressure 23, 50% / 12 PEEP; complete 5 days of plaquenil 4/08 Plateau 23, PEEP 12 / 40% 4/14 transfer to floor bed 4/17 transfer to stepdown and ETT  4/20 extubated   Consultants: Pulmonology  Procedures: ETT  Antibiotics:  Rocephin and azithromycin 03/11/2019-03/15/2019  Hydroxychloroquine 03/12/2019-03/17/2019  Diflucan 03/28/2019-ongoing  Vancomycin 4/17  Cefepime 4/17--> 4/18   Subjective/Interval History: Patient was extubated yesterday.  He appears to be somewhat confused this morning.  Distracted.  Apparently got agitated overnight requiring restraints.  Following commands.   Assessment/Plan:  Acute Hypoxic Resp. Failure due to Acute Covid 19 Viral Illness during the ongoing 2020 Covid 19 Pandemic/MRSA Pneumonia -Patient required intubation -He received Actemra -He was treated with Plaquenil x5 days -And was on room air once extubated, but he did have frailty, difficult to manage secretions and required increasing oxygen.  -Reintubated on 03/28/2019.  -Antibiotics broadened to cefepime and vancomycin.  -Cultures from tracheal aspirate positive for MRSA.  Cefepime was discontinued.   Patient remains on vancomycin (started on 4/17).  Patient also noted to be on fluconazole.  No clear reason for same.  No yeast noted in any of the cultures or UA. This was subsequently discontinued. Pulmonology was following.  Patient was extubated yesterday.  Respiratory status is stable. PT/OT/speech therapy to see.  The treatment plan and use of medications and known side effects were discussed with patient/family, they were clearly explained that there is no proven definitive treatment for COVID-19 infection, any medications used here are based on published clinical articles/anecdotal data which are not peer-reviewed or randomized control trials.  Complete risks and long-term side effects are unknown, however in the best clinical judgment they seem to be of some clinical benefit rather than medical risks.  Patient/family agree with the treatment plan and want to receive the given medications.  Patient was subsequently given Actemra as well as hydroxychloroquine.  Acute metabolic encephalopathy/likely ICU stay delirium Haldol as needed.  QTC when last checked was normal.  He is on restraints currently.  Essential hypertension: Occasional high readings noted.  Patient is on amlodipine.  Blood pressure very well controlled for the most part.  Okay to continue amlodipine for now.    History of a stroke: No new deficit.  He is on a statin and Plavix.  Acute kidney injury: Renal function stable.  Much improved.  Monitor urine output.  Hypernatremia: Likely due to free water deficit.  Sodium peaked at 156.  Sodium has been stable at 149 for the past 3 days.  Continue to monitor.  Waiting for speech therapy evaluation before he can be put on diet.  Hypokalemia Potassium was repleted.  Noted to be 3.6 today.  Magnesium 2.5.  We will give him a couple doses of potassium intravenously.    Type 2  diabetes: Now that he is off of tube feedings his CBGs have been on the lower side.  Stop the  Lantus.  Continue SSI.  Discontinue scheduled NovoLog as well.    Normocytic anemia Patient did have a drop in his hemoglobin but it is stable.  Anemia panel reviewed.  Ferritin 371, B12 848, folate 13.1.  Drop in hemoglobin is likely dilutional.  No overt bleeding has been noted.  Nutrition He was on tube feeds while he was intubated.  Currently waiting on speech therapy evaluation.    DVT prophylaxis: Lovenox Code Status: Full code Family Communication: Patient's ex-wife is next of kin and making decisions. Disposition Plan:  Okay for transfer to the progressive unit   DVT Prophylaxis: On Lovenox  Lab Results  Component Value Date   PLT 152 04/01/2019     PUD Prophylaxis: Famotidine  Code Status: Full code Family Communication: CCM to discuss with family Disposition Plan: Will remain in ICU.  Plan is for possible extubation later today.   Medications:  Scheduled: . amLODipine  5 mg Per Tube QAC breakfast  . atorvastatin  40 mg Per Tube q1800  . chlorhexidine gluconate (MEDLINE KIT)  15 mL Mouth Rinse BID  . clopidogrel  75 mg Per Tube QAC breakfast  . enoxaparin (LOVENOX) injection  40 mg Subcutaneous Q24H  . famotidine  20 mg Oral BID  . feeding supplement (PRO-STAT SUGAR FREE 64)  60 mL Per Tube Q24H  . insulin aspart  0-15 Units Subcutaneous Q4H  . mouth rinse  15 mL Mouth Rinse BID  . nutrition supplement (JUVEN)  1 packet Per Tube BID BM  . sodium chloride flush  10-40 mL Intracatheter Q12H  . sodium chloride flush  3 mL Intravenous Q12H   Continuous: . feeding supplement (OSMOLITE 1.5 CAL) 1,000 mL (03/31/19 0414)  . vancomycin     SHF:WYOVZCHYIFOYD (TYLENOL) oral liquid 160 mg/5 mL **OR** acetaminophen, bisacodyl, bisacodyl, docusate, haloperidol lactate, hydrALAZINE, HYDROmorphone (DILAUDID) injection, ipratropium, labetalol, metoprolol tartrate, [DISCONTINUED] ondansetron **OR** ondansetron (ZOFRAN) IV, oxyCODONE, polyethylene glycol, sodium chloride  flush   Objective:  Vital Signs  Vitals:   04/01/19 0500 04/01/19 0600 04/01/19 0700 04/01/19 0800  BP: 103/74 107/71 119/73 111/73  Pulse:      Resp:      Temp:      TempSrc:      SpO2: 100% 98% 100% 98%  Weight: 104.3 kg     Height:        Intake/Output Summary (Last 24 hours) at 04/01/2019 0845 Last data filed at 04/01/2019 0800 Gross per 24 hour  Intake 274.77 ml  Output 920 ml  Net -645.23 ml   Filed Weights   03/30/19 2317 03/31/19 0412 04/01/19 0500  Weight: 103.7 kg 103.7 kg 104.3 kg   General appearance: Noted to be awake alert.  In no distress.  Confused.   Resp: Normal effort at rest.  Coarse breath sounds bilaterally.  No wheezing or rhonchi.   Cardio: S1-S2 is normal regular.  No S3-S4.  No rubs murmurs or bruit GI: Abdomen is soft.  Nontender nondistended.  Bowel sounds are present normal.  No masses organomegaly Extremities: No edema.  Moving his extremities.  Deconditioned. Neurologic: He is awake alert.  Confused.  Following commands.  No obvious focal neurological deficits.  No facial asymmetry.     Lab Results:  Data Reviewed: I have personally reviewed following labs and imaging studies  CBC: Recent Labs  Lab 03/28/19 0613 03/29/19 0434 03/30/19 0310  03/31/19 0500 04/01/19 0304  WBC 7.0 8.2 7.3 6.0 5.6  HGB 12.3* 12.2* 10.5* 9.1* 9.3*  HCT 41.3 41.9 35.5* 31.5* 30.9*  MCV 90.8 89.7 89.9 90.5 90.1  PLT 164 166 PLATELET CLUMPS NOTED ON SMEAR, COUNT APPEARS ADEQUATE 139* 093    Basic Metabolic Panel: Recent Labs  Lab 03/26/19 0823  03/28/19 0613 03/29/19 0434 03/30/19 0310 03/31/19 0500 04/01/19 0304  NA 156*   < > 149* 151* 149* 149* 149*  K 3.2*   < > 3.7 3.5 3.1* 3.2* 3.6  CL 123*   < > 117* 115* 115* 117* 116*  CO2 25   < > 24 24 25 25 26   GLUCOSE 189*   < > 362* 212* 212* 196* 125*  BUN 42*   < > 32* 46* 52* 39* 21  CREATININE 1.17   < > 1.14 1.61* 1.41* 1.04 0.79  CALCIUM 9.3   < > 8.6* 8.7* 8.4* 8.0* 8.2*  MG 3.0*  --   2.4  --  2.7*  --  2.5*  PHOS 4.7*  --   --   --   --   --   --    < > = values in this interval not displayed.    GFR: Estimated Creatinine Clearance: 106 mL/min (by C-G formula based on SCr of 0.79 mg/dL).  Liver Function Tests: Recent Labs  Lab 03/26/19 0823 03/28/19 0613 03/29/19 0434 04/01/19 0304  AST 26 16 27 26   ALT 38 26 28 29   ALKPHOS 99 99 103 101  BILITOT 1.4* 0.8 0.9 0.7  PROT 7.4 7.2 7.5 6.8  ALBUMIN 3.5 3.2* 3.2* 2.6*    CBG: Recent Labs  Lab 03/31/19 1552 03/31/19 2007 04/01/19 0009 04/01/19 0331 04/01/19 0733  GLUCAP 124* 121* 131* 112* 104*     Recent Results (from the past 240 hour(s))  Culture, respiratory (non-expectorated)     Status: None   Collection Time: 03/28/19  3:58 PM  Result Value Ref Range Status   Specimen Description   Final    TRACHEAL ASPIRATE Performed at [facility] [address] [place], [place] [address]    Special Requests   Final    NONE Performed at [facility], [place] [address], [place], [place] [address]    Gram Stain   Final    ABUNDANT WBC PRESENT,BOTH PMN AND MONONUCLEAR ABUNDANT GRAM POSITIVE COCCI IN PAIRS Performed at [facility], [place] [address] [place], [place] [address]    Culture   Final    MODERATE METHICILLIN RESISTANT STAPHYLOCOCCUS AUREUS   Report Status 03/30/2019 FINAL  Final   Organism ID, Bacteria METHICILLIN RESISTANT STAPHYLOCOCCUS AUREUS  Final      Susceptibility   Methicillin resistant staphylococcus aureus - MIC*    CIPROFLOXACIN >=8 RESISTANT Resistant     ERYTHROMYCIN >=8 RESISTANT Resistant     GENTAMICIN <=0.5 SENSITIVE Sensitive     OXACILLIN >=4 RESISTANT Resistant     TETRACYCLINE <=1 SENSITIVE Sensitive     VANCOMYCIN 1 SENSITIVE Sensitive     TRIMETH/SULFA <=10 SENSITIVE Sensitive     CLINDAMYCIN <=0.25 SENSITIVE Sensitive     RIFAMPIN <=0.5 SENSITIVE Sensitive     Inducible Clindamycin NEGATIVE Sensitive     * MODERATE  METHICILLIN RESISTANT STAPHYLOCOCCUS AUREUS      Radiology Studies: No results found.     LOS: 21 days   Liliann File [employer] on [email or web address]  04/01/2019, 8:45 AM

## 2019-04-02 LAB — CBC
HCT: 31.5 % — ABNORMAL LOW (ref 39.0–52.0)
Hemoglobin: 9.7 g/dL — ABNORMAL LOW (ref 13.0–17.0)
MCH: 27.1 pg (ref 26.0–34.0)
MCHC: 30.8 g/dL (ref 30.0–36.0)
MCV: 88 fL (ref 80.0–100.0)
Platelets: 184 10*3/uL (ref 150–400)
RBC: 3.58 MIL/uL — ABNORMAL LOW (ref 4.22–5.81)
RDW: 13.2 % (ref 11.5–15.5)
WBC: 4.9 10*3/uL (ref 4.0–10.5)
nRBC: 0 % (ref 0.0–0.2)

## 2019-04-02 LAB — GLUCOSE, CAPILLARY
Glucose-Capillary: 102 mg/dL — ABNORMAL HIGH (ref 70–99)
Glucose-Capillary: 107 mg/dL — ABNORMAL HIGH (ref 70–99)
Glucose-Capillary: 108 mg/dL — ABNORMAL HIGH (ref 70–99)
Glucose-Capillary: 128 mg/dL — ABNORMAL HIGH (ref 70–99)
Glucose-Capillary: 154 mg/dL — ABNORMAL HIGH (ref 70–99)
Glucose-Capillary: 172 mg/dL — ABNORMAL HIGH (ref 70–99)
Glucose-Capillary: 89 mg/dL (ref 70–99)

## 2019-04-02 LAB — BASIC METABOLIC PANEL
Anion gap: 8 (ref 5–15)
BUN: 20 mg/dL (ref 8–23)
CO2: 24 mmol/L (ref 22–32)
Calcium: 8 mg/dL — ABNORMAL LOW (ref 8.9–10.3)
Chloride: 114 mmol/L — ABNORMAL HIGH (ref 98–111)
Creatinine, Ser: 0.75 mg/dL (ref 0.61–1.24)
GFR calc Af Amer: >60 mL/min (ref >60)
GFR calc non Af Amer: >60 mL/min (ref >60)
Glucose, Bld: 161 mg/dL — ABNORMAL HIGH (ref 70–99)
Potassium: 3.2 mmol/L — ABNORMAL LOW (ref 3.5–5.1)
Sodium: 146 mmol/L — ABNORMAL HIGH (ref 135–145)

## 2019-04-02 MED ORDER — POTASSIUM CHLORIDE 10 MEQ/100ML IV SOLN
10.0000 meq | INTRAVENOUS | Status: AC
  Administered 2019-04-02 (×3): 10 meq via INTRAVENOUS
  Filled 2019-04-02: qty 100

## 2019-04-02 MED ORDER — SODIUM CHLORIDE 0.9 % IV SOLN
INTRAVENOUS | Status: DC | PRN
  Administered 2019-04-02 – 2019-04-09 (×3): 250 mL via INTRAVENOUS

## 2019-04-02 MED ORDER — POTASSIUM CHLORIDE 10 MEQ/100ML IV SOLN
10.0000 meq | INTRAVENOUS | Status: AC
  Administered 2019-04-02 (×3): 10 meq via INTRAVENOUS
  Filled 2019-04-02 (×2): qty 100

## 2019-04-02 MED ORDER — HALOPERIDOL LACTATE 5 MG/ML IJ SOLN
2.0000 mg | Freq: Three times a day (TID) | INTRAMUSCULAR | Status: DC
  Administered 2019-04-02 – 2019-04-03 (×4): 2 mg via INTRAVENOUS
  Filled 2019-04-02 (×4): qty 1

## 2019-04-02 MED ORDER — DEXTROSE 5 % IV SOLN
INTRAVENOUS | Status: DC
  Administered 2019-04-02: 13:00:00 via INTRAVENOUS
  Administered 2019-04-03: 11:00:00 50 mL/h via INTRAVENOUS

## 2019-04-02 NOTE — Progress Notes (Signed)
   04/02/19 1400  Clinical Encounter Type  Visited With Family  Visit Type Initial;Psychological support;Spiritual support  Referral From Nurse  Consult/Referral To Chaplain  Spiritual Encounters  Spiritual Needs Emotional;Other (Comment) (Spiritual Care Conversation/Support)  Stress Factors  Family Stress Factors Health changes   I spoke with the patient's ex-wife, Bronson Ing. She stated that she and her children, the patient's children, are anxious for him to heal so that he can be there for his first grandchild who will be born soon. Bronson Ing told me that Mr. Burrowes prefers to go by the name, "Friday." She said that he has two children that are in their early 43s. His son has been living with him. Once Mr. Twenter gets better she has agreed to bring him back to [place] with her and take care of him until he can get back on his feet. She is optimistic that Mr. Derda will get better, but says that he may need some coaxing to start walking and doing what he needs to do to heal.  She was appreciative of the support and is pleased with the care that he is receiving.    Please, contact Spiritual Care for further assistance.  Chaplain Clint Bolder M.Div., [employer]

## 2019-04-02 NOTE — Progress Notes (Signed)
Speech Language Pathology Dysphagia Treatment Patient Details Name: Benjamin Cohen MRN: [id number] DOB: January 09, 1956 Today's Date: 04/02/2019 Time: 1720-1730 SLP Time Calculation (min) (ACUTE ONLY): 10 min  Assessment / Plan / Recommendation Clinical Impression   Pt presents with persisting concerns for dysphagia with aspiration.  He is more alert and interactive today; remains easily distracted. Pulled NG last night. Was working with OT upon entering room - reassessment of swallow was brief.  Vocal quality remains impaired with voicing and pitch breaks.  Continues with multiple sub-swallows per bolus, concerning for accumulation of residue/reduced transport through pharynx.  Inconsistent coughing noted and not dependent upon bolus size but suggestive of aspiration.  Profile appears more consistent with an underlying dysphagia for which he has decompensated, perhaps related to prior neuro involvement.  Appears to have improved secretion management today; he is on room air.  Recommend continuing NPO in general, but allowing sips of water/ice chips after oral care.  Communicated with Dr. Rito Ehrlich re: above.  SLP will follow a minimum of 4x/week.    Diet Recommendation    NPO excepts sips/chips after oral care   SLP Plan Continue with current plan of care      Swallowing Goals     General Behavior/Cognition: Alert;Confused;Requires cueing;Distractible Patient Positioning: Upright in bed Oral care provided: N/A HPI: 63 y/o M admitted 3/31 COVID (+).Developed worsening hypoxemia, respiratory distress 4/1 and PCCM consulted for evaluation. Intubated 4/2 with ARDS.Was extubated 03/23/2019, with no oxygen requirement. Initial clinical swallow evaluation 4/13 revealed concerns for severe dysphagia; NPO was recommended, TF.  Pt developed respiratory distress and was reintubated on 03/28/2019; extubated 4/20. .   Oral Cavity - Oral Hygiene     Dysphagia Treatment Treatment Methods: Skilled  observation Patient observed directly with PO's: Yes Type of PO's observed: Thin liquids;Ice chips Feeding: (no opportunity for self-feeding) Liquids provided via: Teaspoon;Straw Pharyngeal Phase Signs & Symptoms: Suspected delayed swallow initiation;Multiple swallows;Delayed throat clear;Delayed cough;Immediate cough Type of cueing: Visual;Tactile;Verbal Amount of cueing: Moderate   GO     Benjamin Cohen 04/02/2019, 5:53 PM  Benjamin Folks L. [name], MA CCC/SLP Acute Rehabilitation Services Office number [phone] Pager [phone]

## 2019-04-02 NOTE — Evaluation (Signed)
Physical Therapy Treatment Patient Details Name: Benjamin Cohen MRN: [id number] DOB: June 10, 1956 Today's Date: 04/02/2019    History of Present Illness 63 y.o. male admitted for acute respiratory failure (+) COVID 19, s/p intubation 4/2-4/11, re-intubated 4/17-4/20/20.  Pt with significant PMH of CVA and HTN.      PT Comments    Pt is better, but not completely normal mentation today.  His processing speed has improved (despite still working off the sedative from last night).  He was able to stand x 2 EOB using the steady standing frame and weight shift his feet.  I am hopeful for progression to walker and OOB to chair soon.  BPs were orthostaic with mild symptoms (reported lightheadedness), but he was able to continue for the full session.  PT will continue to follow acutely for safe mobility progression.   Follow Up Recommendations  CIR     Equipment Recommendations  Rolling walker with 5" wheels;3in1 (PT);Wheelchair (measurements PT);Wheelchair cushion (measurements PT)    Recommendations for Other Services Rehab consult     Precautions / Restrictions Precautions Precautions: Fall Precaution Comments: rectal tube, foley    Mobility  Bed Mobility Overal bed mobility: Needs Assistance Bed Mobility: Supine to Sit;Sit to Supine;Rolling Rolling: Mod assist;+2 for physical assistance   Supine to sit: Mod assist;+2 for physical assistance;HOB elevated Sit to supine: Mod assist;+2 for physical assistance;HOB elevated   General bed mobility comments: Mod assist for bed mobility and transitions.  Multimodal cues to help initiate movement.  Support at trunk and legs for transitions.   Transfers Overall transfer level: Needs assistance   Transfers: Sit to/from Stand Sit to Stand: +2 physical assistance;Mod assist;From elevated surface         General transfer comment: Two person mod assist to stand x 2 from elevated bed, mostly to power up to standing, less assist once upright.   Cues for extended hips, knees, tall posture.   Ambulation/Gait             General Gait Details: not ready yet          Balance Overall balance assessment: Needs assistance Sitting-balance support: Feet supported;Bilateral upper extremity supported Sitting balance-Leahy Scale: Poor Sitting balance - Comments: upt to mod assist at trunk EOB due to posterior and left lateral lean initially.  practiced coming down on each elbow and coming back up to sitting until his sitting balance improved.  What helped the most with his sitting balance was coming to standing a few time.  He sat best with supervision only at the end of our standing trials.   Postural control: Posterior lean;Left lateral lean Standing balance support: Bilateral upper extremity supported Standing balance-Leahy Scale: Poor Standing balance comment: bil UE supported and two therapist to power up to stand.  Supported by the steady and therapists in standing.  Able to stand for ~45 seconds both times working on weight shifting on his feet and upright posture.                             Cognition Arousal/Alertness: Awake/alert Behavior During Therapy: Flat affect Overall Cognitive Status: Impaired/Different from baseline Area of Impairment: Orientation;Attention;Memory;Following commands;Safety/judgement;Awareness;Problem solving                 Orientation Level: Time(oriented to place, time, situation, not year) Current Attention Level: Sustained Memory: Decreased short-term memory Following Commands: Follows one step commands consistently;Follows one step commands with increased time Safety/Judgement:  Decreased awareness of safety;Decreased awareness of deficits Awareness: Emergent Problem Solving: Slow processing;Decreased initiation General Comments: Pt's cognition much improved today, needs encouragement to verbalize (low tone, soft), but does.  He responds to questions with a bit of a delay  in processing, but much better than last session.  The only orientation question he got wrong was when I asked him the year he said "2011".  [facility] and [facility] [place]"      Exercises General Exercises - Lower Extremity Long Arc Quad: AROM;Both;5 reps Hip Flexion/Marching: AROM;Both;5 reps    General Comments General comments (skin integrity, edema, etc.): VSS throughout on RA, except BPs were (+) orthostatic.  Pt did report lightheadedness, but was able to continue without distress despite drop.       Pertinent Vitals/Pain Pain Assessment: No/denies pain           PT Goals (current goals can now be found in the care plan section) Acute Rehab PT Goals Patient Stated Goal: [facility] x[facility] Progress towards PT goals: Progressing toward goals    Frequency    Min 4X/week      PT Plan Current plan remains appropriate       AM-PAC PT "6 Clicks" Mobility   Outcome Measure  Help needed turning from your back to your side while in a flat bed without using bedrails?: A Lot Help needed moving from lying on your back to sitting on the side of a flat bed without using bedrails?: A Lot Help needed moving to and from a bed to a chair (including a wheelchair)?: A Lot Help needed standing up from a chair using your arms (e.g., wheelchair or bedside chair)?: A Lot Help needed to walk in hospital room?: Total Help needed climbing 3-5 steps with a railing? : Total 6 Click Score: 10    End of Session   Activity Tolerance: Patient limited by fatigue Patient left: in bed;with call bell/phone within reach Nurse Communication: Mobility status PT Visit Diagnosis: Difficulty in walking, not elsewhere classified (R26.2);Muscle weakness (generalized) (M62.81)     Time: [id number] PT Time Calculation (min) (ACUTE ONLY): 28 min  Charges:  $Therapeutic Activity: 23-37 mins                    Sadye Kiernan B. Ahmad Vanwey, PT, DPT  Acute Rehabilitation [phone]  pager #[phone] office   04/02/2019, 3:23 PM

## 2019-04-02 NOTE — Progress Notes (Signed)
Assisted tele visit to patient with wife.  Chayson Charters M Theotis Gerdeman, RN   

## 2019-04-02 NOTE — Progress Notes (Signed)
Occupational Therapy Treatment Patient Details Name: Benjamin Cohen MRN: [id number] DOB: [birth year] Today's Date: 04/02/2019    History of present illness 63 y.o. male admitted for acute respiratory failure (+) COVID 19, s/p intubation 4/2-4/11, re-intubated 4/17-4/20/20.  Pt with significant PMH of CVA and HTN.     OT comments  Able to progress OOB to chair today with use of Stedy. Pt stood with use of Stedy at window to look outside and assist with orientation. ST assessing swallowing and allowing ice chips/sips of water after oral care. Continued session after ST and pt able to bring spoon to mouth and regulate "2 ice chips" per bite. Pt more alert and interactive once in chair. Continue to recommend CIR for rehab. Pt able to verbalize that his goal is to be able to play golf again.  Continue to recommend measures to reduce delirium with Blinds open/lights on during the day with reorientation as needed. Pt asking nurse to talk/facetime family.   Follow Up Recommendations  CIR;Supervision/Assistance - 24 hour    Equipment Recommendations  3 in 1 bedside commode    Recommendations for Other Services Rehab consult    Precautions / Restrictions Precautions Precautions: Fall Precaution Comments: rectal tube, foley       Mobility Bed Mobility Overal bed mobility: Needs Assistance Bed Mobility: Supine to Sit   Supine to sit: Mod assist   General bed mobility comments: A to reach with LUE to rail; A to transition trunk upright; Assist to transition LLE off bed  Transfers Overall transfer level: Needs assistance   Transfers: Sit to/from Stand Sit to Stand: +2 physical assistance;Min assist;From elevated surface(mod A from lower surface)             Balance Overall balance assessment: Needs assistance Sitting-balance support: Feet supported;Bilateral upper extremity supported Sitting balance-Leahy Scale: Poor Sitting balance - Comments: R lateral lean; occasional posterior  lean; improved from yestereday Postural control: Posterior lean;Left lateral lean Standing balance support: Bilateral upper extremity supported Standing balance-Leahy Scale: Poor                         ADL either performed or assessed with clinical judgement   ADL Overall ADL's : Needs assistance/impaired Eating/Feeding: Maximal assistance Eating/Feeding Details (indicate cue type and reason): holding cup with L hand and able to hold utensil and bringing it to his mouth. Able to follow commands to only eat 2 ice chips. ; attention affecting ability to maintain grasp on cup as vc required to not drop cup. Cup placed on table. Pt needs full S with ice chips/ sips of water Grooming: Maximal assistance                               Functional mobility during ADLs: +2 for physical assistance;Moderate assistance General ADL Comments: Used Stedy to progress pt to chair. Pt stood at window in attempt to improve delirium and reorient.  Encourage blinds open during the day.      Vision       Perception     Praxis      Cognition Arousal/Alertness: Awake/alert Behavior During Therapy: Flat affect Overall Cognitive Status: Impaired/Different from baseline Area of Impairment: Orientation;Attention;Memory;Awareness;Safety/judgement;Following commands;Problem solving                 Orientation Level: Time Current Attention Level: Sustained Memory: Decreased recall of precautions;Decreased short-term memory Following Commands: Follows one step  commands consistently;Follows one step commands with increased time Safety/Judgement: Decreased awareness of safety;Decreased awareness of deficits Awareness: Emergent Problem Solving: Slow processing;Decreased initiation;Difficulty sequencing;Requires verbal cues;Requires tactile cues General Comments: Continues to improve. Pt had sedating meds last night. Feel pt is hallucinating - seeing chocolate in the wipes container  and looking for his car in the parking lot. Reoriented pt. Blinds opended. Pt pushed to window to look outside. Attention also affecting postural control. Able to correctly sequence hodling cup and bringing ice chips to mouth with direct supervision        Exercises General BUE AROM/strengthening   Shoulder Instructions       General Comments VSS throughout    Pertinent Vitals/ Pain       Pain Assessment: Faces Faces Pain Scale: No hurt  Home Living                                          Prior Functioning/Environment              Frequency  Min 3X/week        Progress Toward Goals  OT Goals(current goals can now be found in the care plan section)  Progress towards OT goals: Progressing toward goals;OT to reassess next treatment  Acute Rehab OT Goals Patient Stated Go[facility] OT Goal Formulation: With patient Time For Goal Achievement: 04/15/19 Potential to Achieve Goals: Good ADL Goals Pt Will Perform Grooming: with modified independence;sitting Pt Will Perform Upper Body Bathing: with set-up;with supervision;sitting Pt Will Perform Lower Body Bathing: with min assist;sit to/from stand Pt Will Transfer to Toilet: bedside commode;with min assist;stand pivot transfer  Plan Discharge plan remains appropriate    Co-evaluation                 AM-PAC OT "6 Clicks" Daily Activity     Outcome Measure   Help from another person eating meals?: A Lot Help from another person taking care of personal grooming?: A Lot Help from another person toileting, which includes using toliet, bedpan, or urinal?: Total Help from another person bathing (including washing, rinsing, drying)?: A Lot Help from another person to put on and taking off regular upper body clothing?: A Lot Help from another person to put on and taking off regular lower body clothing?: Total 6 Click Score: 10    End of Session Equipment Utilized During  Treatment: Antony Salmon(Stedy)  OT Visit Diagnosis: Other abnormalities of gait and mobility (R26.89);Muscle weakness (generalized) (M62.81);Pain;Other symptoms and signs involving cognitive function   Activity Tolerance Patient tolerated treatment well   Patient Left in chair;with call bell/phone within reach;with nursing/sitter in room   Nurse Communication Mobility status;Need for lift equipment(Stedy)        Time: [id number] OT Time Calculation (min): 32 min  Charges: OT General Charges $OT Visit: 1 Visit OT Treatments $Self Care/Home Management : 8-22 mins $Therapeutic Activity: 8-22 mins  Luisa DagoHilary Jorian [place], OT/L   Acute OT Clinical Specialist Acute Rehabilitation Services Pager [phone] Office [phone]    [facility] LLC[facility],HILLARY 04/02/2019, 6:42 PM

## 2019-04-02 NOTE — Progress Notes (Signed)
PROGRESS NOTE  Benjamin Cohen EGB:[id number] DOB: [birth year] DOA: 03/11/2019  PCP: Maude Leriche, PA-C  Brief History/Interval Summary: 62 y/o M admitted 3/31 with reports of fever, cough, SOB and confusion. He was recently at a wedding mid March and after multiple family members were diagnosed with COVID including his wife. Developed worsening hypoxemia, respiratory distress 4/1 and PCCM consulted for evaluation. Intubated 4/2 with ARDS.Was extubated 03/23/2019, usually with no oxygen requirement.  Patient remained frail, aspirating.  He started going back on oxygen and ended up reintubated on 03/28/2019.   3/31 Admit, 2L, bilateral patchy infiltrates  4/01 PCCM consulted 4/02 Intubated 4/03 toclizamab x 1 4/05 Net positive, remains on paralytic, still prone 4/06 Supine, plateau pressure 23, 50% / 12 PEEP; complete 5 days of plaquenil 4/08 Plateau 23, PEEP 12 / 40% 4/14 transfer to floor bed 4/17 transfer to stepdown and ETT  4/20 extubated   Consultants: Pulmonology  Procedures: ETT  Antibiotics:  Rocephin and azithromycin 03/11/2019-03/15/2019  Hydroxychloroquine 03/12/2019-03/17/2019  Diflucan 03/28/2019-4/19  Vancomycin 4/17  Cefepime 4/17--> 4/18   Subjective/Interval History: Patient noted to have agitation overnight.  He pulled out his NG feeding tube.  He had to be placed on Precedex.  Appears that this has been turned off this morning.  Patient appears to be calm.  Not really engaging.  Does follow commands.   Assessment/Plan:  Acute Hypoxic Resp. Failure due to Acute Covid 19 Viral Illness during the ongoing 2020 Covid 19 Pandemic/MRSA Pneumonia -Patient required intubation -He received Actemra -He was treated with Plaquenil x5 days -And was on room air once extubated, but he did have frailty, difficult to manage secretions and required increasing oxygen.  -Reintubated on 03/28/2019.  -Antibiotics broadened to cefepime and vancomycin.  -Cultures from  tracheal aspirate positive for MRSA.  Cefepime was discontinued.  Patient remains on vancomycin (started on 4/17).  Plan to treat for 8 days total.  Patient was also briefly placed on fluconazole which was also discontinued as no yeast was noted on cultures. Patient was extubated on 4/20.  Respiratory status remained stable. PT/OT/speech therapy to see.  The treatment plan and use of medications and known side effects were discussed with patient/family, they were clearly explained that there is no proven definitive treatment for COVID-19 infection, any medications used here are based on published clinical articles/anecdotal data which are not peer-reviewed or randomized control trials.  Complete risks and long-term side effects are unknown, however in the best clinical judgment they seem to be of some clinical benefit rather than medical risks.  Patient/family agree with the treatment plan and want to receive the given medications.  Patient was subsequently given Actemra as well as hydroxychloroquine.  Acute metabolic encephalopathy/likely ICU stay delirium Patient became agitated overnight requiring Precedex.  This has been discontinued this morning.  Ideally patient will benefit from Seroquel.  However he has not been cleared for oral intake yet.  We will place him on Haldol on a scheduled basis.  QTC was normal when last checked.  We will recheck another QTC.  Currently patient is calm and off restraints.    Dysphagia, post extubation Speech therapy is following.  Yesterday they did not clear him for any oral intake.  A feeding tube was placed however patient pulled it out at night.  We will leave it out for now.  We will wait and see if speech will reassess him.  If he continues to fail his swallow evaluation then we may have to reinsert the feeding  tube.  Essential hypertension: Patient noted to be soft for the most part.  We will discontinue amlodipine.  Monitor closely.  History of a  stroke: No new deficit.  He is on a statin and Plavix.  Resume when able to take orally.  Acute kidney injury: Renal function is back to normal.  Monitor urine output.  Hypernatremia: Likely due to free water deficit.  Sodium peaked at 156.  Sodium level has improved to 146 this morning.  Continue to monitor.   Hypokalemia Continues to require potassium on a daily basis.  Magnesium was 2.5 yesterday.  Potassium 3.2 today.  We will give him IV potassium.    Type 2 diabetes: Patient was on Lantus while he was on tube feedings.  His CBGs have been on the lower side since extubation.  Lantus was discontinued.  Continue SSI.    Normocytic anemia Patient did have a drop in his hemoglobin but it is stable.  Anemia panel reviewed.  Ferritin 371, B12 848, folate 13.1.  Drop in hemoglobin is likely dilutional.  No overt bleeding has been noted.  Hemoglobin remains stable.  Nutrition Feeding tube was inserted yesterday after speech therapy did not clear him for oral intake.  However patient pulled it out overnight.  We will leave it out for now.  Have speech therapy reevaluate patient.  DVT prophylaxis: Lovenox Code Status: Full code Family Communication: Patient's ex-wife is next of kin and making decisions. Disposition Plan:  Patient remains stable.   DVT Prophylaxis: On Lovenox  Lab Results  Component Value Date   PLT 184 04/02/2019   PUD Prophylaxis: Famotidine  Code Status: Full code Family Communication: Will attempt to call his family today. Disposition Plan: Okay for transfer to [facility] but there are no beds available.  Also will attempt to control his agitation better with scheduled Haldol or Seroquel if he is allowed oral intake.   Medications:  Scheduled: . amLODipine  5 mg Per Tube QAC breakfast  . atorvastatin  40 mg Per Tube q1800  . chlorhexidine gluconate (MEDLINE KIT)  15 mL Mouth Rinse BID  . clopidogrel  75 mg Per Tube QAC breakfast  . enoxaparin  (LOVENOX) injection  40 mg Subcutaneous Q24H  . famotidine  20 mg Oral BID  . feeding supplement (PRO-STAT SUGAR FREE 64)  30 mL Per Tube BID  . insulin aspart  0-15 Units Subcutaneous Q4H  . mouth rinse  15 mL Mouth Rinse BID  . nutrition supplement (JUVEN)  1 packet Per Tube BID BM  . sodium chloride flush  10-40 mL Intracatheter Q12H   Continuous: . sodium chloride 250 mL (04/02/19 0837)  . dexmedetomidine (PRECEDEX) IV infusion Stopped (04/02/19 0655)  . feeding supplement (OSMOLITE 1.5 CAL) 1,000 mL (04/01/19 1721)  . potassium chloride 10 mEq (04/02/19 0816)  . vancomycin 1,000 mg (04/02/19 1224)   WIN:EYHDQO chloride, acetaminophen (TYLENOL) oral liquid 160 mg/5 mL **OR** acetaminophen, bisacodyl, bisacodyl, docusate, haloperidol lactate, hydrALAZINE, HYDROmorphone (DILAUDID) injection, ipratropium, labetalol, metoprolol tartrate, [DISCONTINUED] ondansetron **OR** ondansetron (ZOFRAN) IV, oxyCODONE, polyethylene glycol, sodium chloride flush   Objective:  Vital Signs  Vitals:   04/02/19 0500 04/02/19 0600 04/02/19 0700 04/02/19 0800  BP: 92/69 96/69 110/82 122/76  Pulse:    60  Resp:    16  Temp:    98.2 F (36.8 C)  TempSrc:    Oral  SpO2: 97% 98%  97%  Weight: 103.2 kg     Height:  Intake/Output Summary (Last 24 hours) at 04/02/2019 0859 Last data filed at 04/02/2019 0835 Gross per 24 hour  Intake 1330.98 ml  Output 1320 ml  Net 10.98 ml   Filed Weights   03/31/19 0412 04/01/19 0500 04/02/19 0500  Weight: 103.7 kg 104.3 kg 103.2 kg   General appearance: Awake alert.  Distracted.  Not agitated.  Appears to be calm. Resp: Normal effort.  Coarse breath sounds bilaterally.  No wheezing or rhonchi.  Few crackles at the bases but good air entry overall.   Cardio: S1-S2 is normal regular.  No S3-S4.  No rubs murmurs or bruit GI: Abdomen is soft.  Nontender nondistended.  Bowel sounds are present normal.  No masses organomegaly Extremities: No edema.  Moves  extremities on command.  Still very deconditioned. Neurologic: Confused.  Appears to be disoriented though he does not answer orientation questions.  No focal deficits noted.    Lab Results:  Data Reviewed: I have personally reviewed following labs and imaging studies  CBC: Recent Labs  Lab 03/29/19 0434 03/30/19 0310 03/31/19 0500 04/01/19 0304 04/02/19 0300  WBC 8.2 7.3 6.0 5.6 4.9  HGB 12.2* 10.5* 9.1* 9.3* 9.7*  HCT 41.9 35.5* 31.5* 30.9* 31.5*  MCV 89.7 89.9 90.5 90.1 88.0  PLT 166 PLATELET CLUMPS NOTED ON SMEAR, COUNT APPEARS ADEQUATE 139* 152 484    Basic Metabolic Panel: Recent Labs  Lab 03/28/19 0613 03/29/19 0434 03/30/19 0310 03/31/19 0500 04/01/19 0304 04/02/19 0300  NA 149* 151* 149* 149* 149* 146*  K 3.7 3.5 3.1* 3.2* 3.6 3.2*  CL 117* 115* 115* 117* 116* 114*  CO2 _0 GLUCOSE 362* 212* 212* 196* 125* 161*  BUN 32* 46* 52* 39* 21 20  CREATININE 1.14 1.61* 1.41* 1.04 0.79 0.75  CALCIUM 8.6* 8.7* 8.4* 8.0* 8.2* 8.0*  MG 2.4  --  2.7*  --  2.5*  --     GFR: Estimated Creatinine Clearance: 105.3 mL/min (by C-G formula based on SCr of 0.75 mg/dL).  Liver Function Tests: Recent Labs  Lab 03/28/19 0613 03/29/19 0434 04/01/19 0304  AST _1 ALT _2 ALKPHOS 99 103 101  BILITOT 0.8 0.9 0.7  PROT 7.2 7.5 6.8  ALBUMIN 3.2* 3.2* 2.6*    CBG: Recent Labs  Lab 04/01/19 1548 04/01/19 2029 04/02/19 0013 04/02/19 0411 04/02/19 0820  GLUCAP 101* 167* 172* 154* 107*     Recent Results (from the past 240 hour(s))  Culture, respiratory (non-expectorated)     Status: None   Collection Time: 03/28/19  3:58 PM  Result Value Ref Range Status   Specimen Description   Final    TRACHEAL ASPIRATE Performed at [facility], [place] [address], [place], [place] [address]    Special Requests   Final    NONE Performed at [facility], [place] [address] [place], [place] [address]    Gram Stain    Final    ABUNDANT WBC PRESENT,BOTH PMN AND MONONUCLEAR ABUNDANT GRAM POSITIVE COCCI IN PAIRS Performed at [facility], [place] [address], [place], [place] [address]    Culture   Final    MODERATE METHICILLIN RESISTANT STAPHYLOCOCCUS AUREUS   Report Status 03/30/2019 FINAL  Final   Organism ID, Bacteria METHICILLIN RESISTANT STAPHYLOCOCCUS AUREUS  Final      Susceptibility   Methicillin resistant staphylococcus aureus - MIC*    CIPROFLOXACIN >=8 RESISTANT Resistant     ERYTHROMYCIN >=8  RESISTANT Resistant     GENTAMICIN <=0.5 SENSITIVE Sensitive     OXACILLIN >=4 RESISTANT Resistant     TETRACYCLINE <=1 SENSITIVE Sensitive     VANCOMYCIN 1 SENSITIVE Sensitive     TRIMETH/SULFA <=10 SENSITIVE Sensitive     CLINDAMYCIN <=0.25 SENSITIVE Sensitive     RIFAMPIN <=0.5 SENSITIVE Sensitive     Inducible Clindamycin NEGATIVE Sensitive     * MODERATE METHICILLIN RESISTANT STAPHYLOCOCCUS AUREUS      Radiology Studies: Dg Abd 1 View  Result Date: 04/01/2019 CLINICAL DATA:  NG tube placement. EXAM: ABDOMEN - 1 VIEW COMPARISON:  None. FINDINGS: Endotracheal tube loops over the gastric body with tip and side hole overlying the proximal stomach. Gas is present in nondilated loops of small and large bowel throughout the abdomen without evidence of obstruction. A catheter projects over the right atrium. No acute osseous abnormality is seen. IMPRESSION: Enteric tube in the proximal stomach. Electronically Signed   By: Logan Bores M.D.   On: 04/01/2019 16:44       LOS: 22 days   Emma-Lee Oddo [employer] on [email or web address]  04/02/2019, 8:59 AM

## 2019-04-02 NOTE — Progress Notes (Signed)
Called and updated pt wife, Bronson Ing. No questions/concerns.

## 2019-04-03 LAB — CBC
HCT: 34.8 % — ABNORMAL LOW (ref 39.0–52.0)
Hemoglobin: 10.8 g/dL — ABNORMAL LOW (ref 13.0–17.0)
MCH: 26.8 pg (ref 26.0–34.0)
MCHC: 31 g/dL (ref 30.0–36.0)
MCV: 86.4 fL (ref 80.0–100.0)
Platelets: 242 10*3/uL (ref 150–400)
RBC: 4.03 MIL/uL — ABNORMAL LOW (ref 4.22–5.81)
RDW: 13.2 % (ref 11.5–15.5)
WBC: 5.6 10*3/uL (ref 4.0–10.5)
nRBC: 0 % (ref 0.0–0.2)

## 2019-04-03 LAB — COMPREHENSIVE METABOLIC PANEL
ALT: 28 U/L (ref 0–44)
AST: 24 U/L (ref 15–41)
Albumin: 2.7 g/dL — ABNORMAL LOW (ref 3.5–5.0)
Alkaline Phosphatase: 93 U/L (ref 38–126)
Anion gap: 11 (ref 5–15)
BUN: 12 mg/dL (ref 8–23)
CO2: 22 mmol/L (ref 22–32)
Calcium: 8.1 mg/dL — ABNORMAL LOW (ref 8.9–10.3)
Chloride: 108 mmol/L (ref 98–111)
Creatinine, Ser: 0.77 mg/dL (ref 0.61–1.24)
GFR calc Af Amer: >60 mL/min (ref >60)
GFR calc non Af Amer: >60 mL/min (ref >60)
Glucose, Bld: 164 mg/dL — ABNORMAL HIGH (ref 70–99)
Potassium: 3.1 mmol/L — ABNORMAL LOW (ref 3.5–5.1)
Sodium: 141 mmol/L (ref 135–145)
Total Bilirubin: 0.9 mg/dL (ref 0.3–1.2)
Total Protein: 6.9 g/dL (ref 6.5–8.1)

## 2019-04-03 LAB — GLUCOSE, CAPILLARY
Glucose-Capillary: 163 mg/dL — ABNORMAL HIGH (ref 70–99)
Glucose-Capillary: 131 mg/dL — ABNORMAL HIGH (ref 70–99)
Glucose-Capillary: 127 mg/dL — ABNORMAL HIGH (ref 70–99)
Glucose-Capillary: 131 mg/dL — ABNORMAL HIGH (ref 70–99)
Glucose-Capillary: 119 mg/dL — ABNORMAL HIGH (ref 70–99)

## 2019-04-03 LAB — POCT I-STAT EG7
Acid-base deficit: 1 mmol/L (ref 0.0–2.0)
Bicarbonate: 22.8 mmol/L (ref 20.0–28.0)
Calcium, Ion: 1.15 mmol/L (ref 1.15–1.40)
HCT: 29 % — ABNORMAL LOW (ref 39.0–52.0)
Hemoglobin: 9.9 g/dL — ABNORMAL LOW (ref 13.0–17.0)
O2 Saturation: 64 %
Patient temperature: 36.9
Potassium: 3.1 mmol/L — ABNORMAL LOW (ref 3.5–5.1)
Sodium: 140 mmol/L (ref 135–145)
TCO2: 24 mmol/L (ref 22–32)
pCO2, Ven: 32 mmHg — ABNORMAL LOW (ref 44.0–60.0)
pH, Ven: 7.46 — ABNORMAL HIGH (ref 7.250–7.430)
pO2, Ven: 31 mmHg — CL (ref 32.0–45.0)

## 2019-04-03 LAB — MAGNESIUM: Magnesium: 2.1 mg/dL (ref 1.7–2.4)

## 2019-04-03 MED ORDER — HALOPERIDOL LACTATE 5 MG/ML IJ SOLN: 1.0000 mg | Freq: Four times a day (QID) | INTRAMUSCULAR | Status: DC | PRN

## 2019-04-03 MED ORDER — HALOPERIDOL LACTATE 5 MG/ML IJ SOLN
1.0000 mg | Freq: Three times a day (TID) | INTRAMUSCULAR | Status: DC
  Administered 2019-04-04: 01:00:00 1 mg via INTRAVENOUS
  Filled 2019-04-03 (×2): qty 1

## 2019-04-03 MED ORDER — HYDRALAZINE HCL 20 MG/ML IJ SOLN
10.0000 mg | INTRAMUSCULAR | Status: DC | PRN
  Administered 2019-04-05: 09:00:00 10 mg via INTRAVENOUS
  Filled 2019-04-03: qty 1

## 2019-04-03 MED ORDER — POTASSIUM CHLORIDE 10 MEQ/50ML IV SOLN
10.0000 meq | INTRAVENOUS | Status: AC
  Administered 2019-04-03 (×4): 10 meq via INTRAVENOUS
  Filled 2019-04-03: qty 50

## 2019-04-03 MED ORDER — KCL IN DEXTROSE-NACL 20-5-0.45 MEQ/L-%-% IV SOLN
INTRAVENOUS | Status: DC
  Administered 2019-04-03 – 2019-04-10 (×9): via INTRAVENOUS
  Filled 2019-04-03 (×12): qty 1000

## 2019-04-03 MED ORDER — MORPHINE SULFATE (PF) 2 MG/ML IV SOLN
1.0000 mg | INTRAVENOUS | Status: DC | PRN
  Administered 2019-04-04: 1 mg via INTRAVENOUS
  Filled 2019-04-03: qty 1

## 2019-04-03 NOTE — Plan of Care (Signed)
  Problem: Activity: Goal: Ability to tolerate increased activity will improve Outcome: Progressing   Problem: Respiratory: Goal: Ability to maintain a clear airway and adequate ventilation will improve Outcome: Completed/Met   Problem: Role Relationship: Goal: Method of communication will improve Outcome: Completed/Met   

## 2019-04-03 NOTE — Progress Notes (Signed)
  Speech Language Pathology Treatment: Dysphagia  Patient Details Name: Benjamin Cohen MRN: [id number] DOB: 18-Jun-1956 Today's Date: 04/03/2019 Time: 1025-8527 SLP Time Calculation (min) (ACUTE ONLY): 20 min  Assessment / Plan / Recommendation Clinical Impression  Pt is drowsy this morning, requesting lights be turned off but SLP provided education and left them on after session to assist in orientation. His vocal quality was wet and soft at baseline, improved in volume and clarity after oral care and several ice chip trials to facilitate secretion management. His cough is weak despite cues for increased force. Overall he continues to show signs of dysphagia not necessarily consistent with solely a post-extubation dysphagia. Given concern for possible underlying dysphagia (pt with h/o multiple CVAs per review of prior imaging), suspect that pt will have a more prolonged period before he is ready to resume a PO diet. Discussed with MD so that temporary, alternative means of nutrition can be considered. Also received orders from MD for cognitive evaluation, although this was held today given current lethargy. Will f/u for additional testing.   HPI HPI: 63 y/o M admitted 3/31 COVID (+).Developed worsening hypoxemia, respiratory distress 4/1 and PCCM consulted for evaluation. Intubated 4/2 with ARDS.Was extubated 03/23/2019, with no oxygen requirement. Initial clinical swallow evaluation 4/13 revealed concerns for severe dysphagia; NPO was recommended, TF.  Pt developed respiratory distress and was reintubated on 03/28/2019; extubated 4/20. Hx prior strokes.       SLP Plan  Continue with current plan of care       Recommendations  Diet recommendations: NPO;Other(comment)(ice chips, small sips of water) Liquids provided via: Teaspoon Medication Administration: Via alternative means                Oral Care Recommendations: Oral care QID Follow up Recommendations: Skilled Nursing  facility SLP Visit Diagnosis: Dysphagia, unspecified (R13.10) Plan: Continue with current plan of care       GO                Virl Axe Laurielle Selmon 04/03/2019, 10:48 AM  Ivar Drape, M.A. CCC-SLP Acute Herbalist [phone] Office [phone]

## 2019-04-03 NOTE — Progress Notes (Signed)
Spoke with patient's ex-wife Bronson Ing over the phone and updated on patient's status. Allowed patient to speak with ex-wife as well. Answered all questions and ex-wife states that she has no additional questions.

## 2019-04-03 NOTE — Progress Notes (Signed)
Physical Therapy Treatment Patient Details Name: Benjamin Cohen MRN: [id number] DOB: Jul 12, 1956 Today's Date: 04/03/2019    History of Present Illness 63 y.o. male admitted for acute respiratory failure (+) COVID 19, s/p intubation 4/2-4/11, re-intubated 4/17-4/20/20.  Pt with significant PMH of CVA and HTN.      PT Comments    Pt was able to ambulate with two person physical assist and a third to follow closely with the chair.  He only went ~8' before fatigue, but tolerated the activity well.  He remains appropriate for intensive post acute rehab.  PT will continue to follow acutely for safe mobility progression   Follow Up Recommendations  CIR     Equipment Recommendations  Rolling walker with 5" wheels;3in1 (PT);Wheelchair (measurements PT);Wheelchair cushion (measurements PT)    Recommendations for Other Services Rehab consult     Precautions / Restrictions Precautions Precautions: Fall Precaution Comments: foley    Mobility  Bed Mobility Overal bed mobility: Needs Assistance Bed Mobility: Supine to Sit Rolling: Mod assist;+2 for physical assistance Sidelying to sit: Mod assist;+2 for physical assistance;HOB elevated       General bed mobility comments: Mod assist to help progress leg all the way off the bed and separately support trunk when coming to sitting.  Hand over hand assit and multimodal cues for initiation and sequencing.   Transfers Overall transfer level: Needs assistance Equipment used: Rolling walker (2 wheeled) Transfers: Sit to/from Stand Sit to Stand: +2 physical assistance;From elevated surface;Mod assist         General transfer comment: Two person mod assist to come to standing EOB.  Support needed at trunk and facilitation and cues for upright posture.   Ambulation/Gait Ambulation/Gait assistance: +2 physical assistance;Mod assist(third person to pull chair and manage lines) Gait Distance (Feet): 8 Feet Assistive device: Rolling walker (2  wheeled) Gait Pattern/deviations: Step-through pattern;Trunk flexed Gait velocity: decreased Gait velocity interpretation: <1.31 ft/sec, indicative of household ambulator General Gait Details: Pt with decreased foot clearance bil, flexed trunk and knee posture, cues for extension throughout and heavy mod assist at trunk and to help control RW.  He walked a few feet, sat down and was exhausted.  HR max 132, O2 stable and BP with diastolic elevation in the 110s (throughout).            Balance Overall balance assessment: Needs assistance Sitting-balance support: Feet supported;Bilateral upper extremity supported Sitting balance-Leahy Scale: Poor Sitting balance - Comments: up to mod assist EOB.  worked in sitting on self correction, finding midline, powering back up with LOB and lateral leaning.  Postural control: (lateral lean to both sides. ) Standing balance support: Bilateral upper extremity supported Standing balance-Leahy Scale: Poor Standing balance comment: Bil UE supporte and two therapist support in standing with RW.                             Cognition Arousal/Alertness: Awake/alert Behavior During Therapy: Flat affect Overall Cognitive Status: Impaired/Different from baseline Area of Impairment: Attention;Memory;Following commands;Safety/judgement;Awareness;Problem solving                   Current Attention Level: Sustained Memory: Decreased recall of precautions;Decreased short-term memory Following Commands: Follows one step commands consistently;Follows one step commands with increased time Safety/Judgement: Decreased awareness of safety;Decreased awareness of deficits Awareness: Emergent Problem Solving: Slow processing;Decreased initiation;Difficulty sequencing;Requires verbal cues;Requires tactile cues General Comments: Requested to unschedule sedation from MD.  He is making improvements,  but continues to have processing delay, awareness issues,  and attention issues.  We think decreasing the scheduled sedative may help.              Pertinent Vitals/Pain Pain Assessment: Faces Pain Score: 0-No pain           PT Goals (current goals can now be found in the care plan section) Acute Rehab PT Goals Patient Stated Goal: [facility] his x-[facility] Progress towards PT goals: Progressing toward goals    Frequency    Min 4X/week      PT Plan Current plan remains appropriate    Co-evaluation PT/OT/SLP Co-Evaluation/Treatment: Yes Reason for Co-Treatment: Complexity of the patient's impairments (multi-system involvement);Necessary to address cognition/behavior during functional activity;For patient/therapist safety;To address functional/ADL transfers PT goals addressed during session: Mobility/safety with mobility;Balance;Strengthening/ROM        AM-PAC PT "6 Clicks" Mobility   Outcome Measure  Help needed turning from your back to your side while in a flat bed without using bedrails?: A Lot Help needed moving from lying on your back to sitting on the side of a flat bed without using bedrails?: A Lot Help needed moving to and from a bed to a chair (including a wheelchair)?: A Lot Help needed standing up from a chair using your arms (e.g., wheelchair or bedside chair)?: A Lot Help needed to walk in hospital room?: A Lot Help needed climbing 3-5 steps with a railing? : Total 6 Click Score: 11    End of Session Equipment Utilized During Treatment: Oxygen;Other (comment)(gait belt) Activity Tolerance: Patient limited by fatigue Patient left: in chair;with call bell/phone within reach;with nursing/sitter in room Nurse Communication: Mobility status;Need for lift equipment(use the steady standing frame to get him back to bed. ) PT Visit Diagnosis: Difficulty in walking, not elsewhere classified (R26.2);Muscle weakness (generalized) (M62.81)     Time: [id number] PT Time Calculation (min) (ACUTE ONLY): 40  min  Charges:  $Gait Training: 8-22 mins            Lanyah Spengler B. Merina Behrendt, PT, DPT  Acute Rehabilitation [phone] pager #[phone] office            04/03/2019, 6:11 PM

## 2019-04-03 NOTE — Progress Notes (Signed)
OT Progress Note  Pt agreeable to participate. Able to take a few steps today with Max A +2 @ RW level; Max A with ADL due to deficits listed below.  Pt received sedating meds this am which likely affected ability to mobilize. PT/OT discussed managing sedation with MD in order to progress with rehab. Will continue to follow acutely. Recommend CIR for rehab.     04/03/19 1800  OT Visit Information  Last OT Received On 04/03/19  Assistance Needed +2  PT/OT/SLP Co-Evaluation/Treatment Yes  Reason for Co-Treatment Complexity of the patient's impairments (multi-system involvement);For patient/therapist safety;To address functional/ADL transfers  OT goals addressed during session ADL's and self-care;Strengthening/ROM;Other (comment) (mobility)  History of Present Illness 63 y.o. male admitted for acute respiratory failure (+) COVID 19, s/p intubation 4/2-4/11, re-intubated 4/17-4/20/20.  Pt with significant PMH of CVA and HTN.    Precautions  Precautions Fall  Precaution Comments foley  Pain Assessment  Pain Assessment Faces  Faces Pain Scale 0  Cognition  Arousal/Alertness Lethargic;Suspect due to medications  Behavior During Therapy Flat affect  Overall Cognitive Status Impaired/Different from baseline  Area of Impairment Attention;Memory;Following commands;Safety/judgement;Awareness;Problem solving  Orientation Level Disoriented to;Time  Current Attention Level Sustained  Memory Decreased recall of precautions;Decreased short-term memory  Following Commands Follows one step commands consistently;Follows one step commands with increased time  Safety/Judgement Decreased awareness of safety;Decreased awareness of deficits  Awareness Emergent  Problem Solving Slow processing;Decreased initiation;Difficulty sequencing;Requires verbal cues;Requires tactile cues  General Comments Requested to unschedule sedation from MD.  He is making improvements, but continues to have processing delay,  awareness issues, and attention issues.  We think decreasing the scheduled sedative may help.   Upper Extremity Assessment  Upper Extremity Assessment Generalized weakness;LUE deficits/detail  RUE Deficits / Details using R UE functionally  LUE Deficits / Details Appesr weaker and more incoordination than R; history of CVA; unsure of baseline use of LUE PTA  LUE Coordination decreased fine motor;decreased gross motor  Lower Extremity Assessment  Lower Extremity Assessment Defer to PT evaluation  ADL  Overall ADL's  Needs assistance/impaired  Eating/Feeding Maximal assistance  Eating/Feeding Details (indicate cue type and reason) holding cup with L hand and able to hold utensil and bringing it to his mouth. Able to follow commands to only eat 2 ice chips. REmebered "2" chips form yesterday's session  Grooming Maximal assistance;Sitting  Grooming Details (indicate cue type and reason) impacted by level of arousal  Functional mobility during ADLs Maximal assistance;+2 for physical assistance;Rolling walker;Cueing for safety;Cueing for sequencing  Bed Mobility  Overal bed mobility Needs Assistance  Bed Mobility Supine to Sit  Rolling Mod assist;+2 for physical assistance  Sidelying to sit Mod assist;+2 for physical assistance;HOB elevated  General bed mobility comments Mod assist to help progress leg all the way off the bed and separately support trunk when coming to sitting.  Hand over hand assit and multimodal cues for initiation and sequencing.   Balance  Overall balance assessment Needs assistance  Sitting-balance support Feet supported;Bilateral upper extremity supported  Sitting balance-Leahy Scale Poor  Sitting balance - Comments up to mod assist EOB.  worked in sitting on self correction, finding midline, powering back up with LOB and lateral leaning.   Postural control  (lateral lean to both sides. )  Standing balance support Bilateral upper extremity supported  Standing  balance-Leahy Scale Poor  Standing balance comment Bil UE supporte and two therapist support in standing with RW.   Transfers  Overall transfer level Needs  assistance  Equipment used Rolling walker (2 wheeled)  Transfers Sit to/from Stand  Sit to Stand +2 physical assistance;From elevated surface;Mod assist  General transfer comment Two person mod assist to come to standing EOB.  Support needed at trunk and facilitation and cues for upright posture. Max A +2 to ambulate; unable to maintain upright posture  General Exercises - Upper Extremity  Shoulder Flexion Strengthening;Both;20 reps  Elbow Flexion Strengthening;Both;20 reps  OT - End of Session  Equipment Utilized During Treatment Rolling walker  Activity Tolerance Patient limited by lethargy;Patient tolerated treatment well (most likely due to meds)  Patient left in chair;with call bell/phone within reach;with nursing/sitter in room  Nurse Communication Mobility status  OT Assessment/Plan  OT Plan Discharge plan remains appropriate  OT Visit Diagnosis Other abnormalities of gait and mobility (R26.89);Muscle weakness (generalized) (M62.81);Pain;Other symptoms and signs involving cognitive function  OT Frequency (ACUTE ONLY) Min 3X/week  Recommendations for Other Services Rehab consult  Follow Up Recommendations CIR;Supervision/Assistance - 24 hour  OT Equipment 3 in 1 bedside commode  AM-PAC OT "6 Clicks" Daily Activity Outcome Measure (Version 2)  Help from another person eating meals? 2  Help from another person taking care of personal grooming? 2  Help from another person toileting, which includes using toliet, bedpan, or urinal? 1  Help from another person bathing (including washing, rinsing, drying)? 2  Help from another person to put on and taking off regular upper body clothing? 2  Help from another person to put on and taking off regular lower body clothing? 1  6 Click Score 10  OT Goal Progression  Progress towards OT  goals Progressing toward goals  Acute Rehab OT Goals  Patient Stated Goal to go to [facility] with his x-wife  OT Goal Formulation With patient  Time For Goal Achievement 04/15/19  Potential to Achieve Goals Good  ADL Goals  Pt Will Perform Grooming with modified independence;sitting  Pt Will Transfer to Toilet bedside commode;with min assist;stand pivot transfer  Additional ADL Goal #1 pt will perform UB adls with mod A seated  Additional ADL Goal #2 pt will go from sit to stand with mod +2 assist for adls  Additional ADL Goal #3 Pt will tolerate A/AAROM to bil UEs to strengthening arms for adls and toilet transfers  Pt Will Perform Upper Body Bathing with set-up;with supervision;sitting  Pt Will Perform Lower Body Bathing with min assist;sit to/from stand  OT Time Calculation  OT Start Time (ACUTE ONLY) 1125  OT Stop Time (ACUTE ONLY) 1205  OT Time Calculation (min) 40 min  OT General Charges  $OT Visit 1 Visit  OT Treatments  $Self Care/Home Management  8-22 mins  $Neuromuscular Re-education 8-22 mins  Luisa Dago, OT/L   Acute OT Clinical Specialist Acute Rehabilitation Services Pager [phone] Office [phone]

## 2019-04-03 NOTE — Progress Notes (Signed)
[place] TEAM 1 - Stepdown/ICU TEAM  Benjamin Cohen  NOB:[id number] DOB: Jan 30, 1956 DOA: 03/11/2019 PCP: Maude Leriche, PA-C    Brief Narrative:  63 y/o M admitted 3/31 with fever, cough, SOB and confusion. He attended a wedding mid March, after which multiple family members were diagnosed with COVID including his wife. He developed worsening hypoxemia w/ respiratory distress 4/1 and PCCM was consulted for evaluation. Intubated 4/2 with ARDS. Extubated 4/12. Had to be reintubated 4/17.   Significant Events: 3/31 Admit, 2L, bilateral patchy infiltrates  4/01 PCCM consulted 4/02 Intubated 4/03 toclizamab x 1 4/05 Net positive, remains on paralytic, still prone 4/06 Supine, plateau pressure 23, 50% / 12 PEEP; complete 5 days of plaquenil 4/08 Plateau 23, PEEP 12 / 40% 4/14 transfer to floor bed 4/17 re-intubated   4/20 extubated 4/22 SLP eval > NPO  COVID-19 specific Treatment: Actemra x1 Plaquenil 4/1 > 4/6   Ventilator Settings: NA - extubated 4/20  Subjective: Pt goes by "Friday."  The patient is sitting up in a bedside chair.  PT and OT are currently working with him.  He is awake but is quite sluggish and appears to be sedated.  Unfortunately the patient has also pulled out his own NG tube on multiple occasions and currently has no route for "oral" meds.   Assessment & Plan:  Acute Hypoxic Resp Failure - Covid 19 associated ARDS - MRSA Pneumonia Now on RA w/ sats 98%+ - stop IV Vanc today after completing 7 days of tx    Acute metabolic encephalopathy - ICU delirium Has at times required Precedex - cont scheduled IV haldol, but lower dose as pt lethargic at time of visit   Dysphagia, post extubation SLP following - has pulled out multiple NG tubes - currently no eternal route - keep NG out for now - SLP still suggests NPO for now - re-assess in AM   HTN BP holding steady for now, w/ occasional increases in DBP - follow w/o change for now   History of CVA  Resume statin and Plavix when enteral intake possible   Hypokalemia Correct to goal of 4.0 - Mg is normal   DM2 CBG controlled    Normocytic anemia Ferritin 371, B12 848, folate 13.1 - no evidence of blood loss - follow trend   Nutrition Need for NPO status is the limiting factor - follow as discussed above    DVT prophylaxis: lovenox  Code Status: FULL CODE Family Communication: no family present at time of exam  Disposition Plan: ready for SDU bed when available   Consultants:  PCCM  Antimicrobials:  Vanc 4/17 > 4/23  Objective: Blood pressure (!) 151/89, pulse 70, temperature 98.7 F (37.1 C), temperature source Oral, resp. rate 16, height _0  (1.702 m), weight 103.2 kg, SpO2 99 %.  Intake/Output Summary (Last 24 hours) at 04/03/2019 0934 Last data filed at 04/03/2019 0800 Gross per 24 hour  Intake 2072.87 ml  Output 1175 ml  Net 897.87 ml   Filed Weights   03/31/19 0412 04/01/19 0500 04/02/19 0500  Weight: 103.7 kg 104.3 kg 103.2 kg    Examination: General: No acute respiratory distress - alert but lethargic  Lungs: Clear to auscultation bilaterally without wheezes or crackles Cardiovascular: Regular rate and rhythm without murmur  Abdomen: Nontender, nondistended, soft, bowel sounds positive, no rebound, no ascites, no appreciable mass Extremities: No significant cyanosis, clubbing, or edema bilateral lower extremities  CBC: Recent Labs  Lab 04/01/19 0304 04/02/19 0300 04/03/19 0335  04/03/19 0435  WBC 5.6 4.9 5.6  --   HGB 9.3* 9.7* 10.8* 9.9*  HCT 30.9* 31.5* 34.8* 29.0*  MCV 90.1 88.0 86.4  --   PLT 152 184 242  --    Basic Metabolic Panel: Recent Labs  Lab 03/30/19 0310  04/01/19 0304 04/02/19 0300 04/03/19 0335 04/03/19 0435  NA 149*   < > 149* 146* 141 140  K 3.1*   < > 3.6 3.2* 3.1* 3.1*  CL 115*   < > 116* 114* 108  --   CO2 25   < > _0 --   GLUCOSE 212*   < > 125* 161* 164*  --   BUN 52*   < > _1 --    CREATININE 1.41*   < > 0.79 0.75 0.77  --   CALCIUM 8.4*   < > 8.2* 8.0* 8.1*  --   MG 2.7*  --  2.5*  --  2.1  --    < > = values in this interval not displayed.   GFR: Estimated Creatinine Clearance: 105.3 mL/min (by C-G formula based on SCr of 0.77 mg/dL).  Liver Function Tests: Recent Labs  Lab 03/28/19 0613 03/29/19 0434 04/01/19 0304 04/03/19 0335  AST _2 ALT _3 ALKPHOS 99 103 101 93  BILITOT 0.8 0.9 0.7 0.9  PROT 7.2 7.5 6.8 6.9  ALBUMIN 3.2* 3.2* 2.6* 2.7*   HbA1C: Hgb A1c MFr Bld  Date/Time Value Ref Range Status  03/11/2019 03:38 PM 12.4 (H) 4.8 - 5.6 % Final    Comment:    (NOTE) Pre diabetes:          5.7%-6.4% Diabetes:              >6.4% Glycemic control for   <7.0% adults with diabetes   04/30/2018 01:28 AM 11.5 (H) 4.8 - 5.6 % Final    Comment:    (NOTE) Pre diabetes:          5.7%-6.4% Diabetes:              >6.4% Glycemic control for   <7.0% adults with diabetes     CBG: Recent Labs  Lab 04/02/19 1704 04/02/19 2049 04/02/19 2324 04/03/19 0339 04/03/19 0743  GLUCAP 102* 128* 108* 163* 131*    Recent Results (from the past 240 hour(s))  Culture, respiratory (non-expectorated)     Status: None   Collection Time: 03/28/19  3:58 PM  Result Value Ref Range Status   Specimen Description   Final    TRACHEAL ASPIRATE Performed at [facility], [place] [address], [place], [place] [address]    Special Requests   Final    NONE Performed at [facility], [place] [address], [place], [place] [address]    Gram Stain   Final    ABUNDANT WBC PRESENT,BOTH PMN AND MONONUCLEAR ABUNDANT GRAM POSITIVE COCCI IN PAIRS Performed at [facility], [place] [address], [place], [place] [address]    Culture   Final    MODERATE METHICILLIN RESISTANT STAPHYLOCOCCUS AUREUS   Report Status 03/30/2019 FINAL  Final   Organism ID, Bacteria METHICILLIN RESISTANT STAPHYLOCOCCUS AUREUS  Final       Susceptibility   Methicillin resistant staphylococcus aureus - MIC*    CIPROFLOXACIN >=8 RESISTANT Resistant     ERYTHROMYCIN >=8 RESISTANT Resistant     GENTAMICIN <=0.5 SENSITIVE Sensitive     OXACILLIN >=  4 RESISTANT Resistant     TETRACYCLINE <=1 SENSITIVE Sensitive     VANCOMYCIN 1 SENSITIVE Sensitive     TRIMETH/SULFA <=10 SENSITIVE Sensitive     CLINDAMYCIN <=0.25 SENSITIVE Sensitive     RIFAMPIN <=0.5 SENSITIVE Sensitive     Inducible Clindamycin NEGATIVE Sensitive     * MODERATE METHICILLIN RESISTANT STAPHYLOCOCCUS AUREUS     Scheduled Meds: . atorvastatin  40 mg Per Tube q1800  . chlorhexidine gluconate (MEDLINE KIT)  15 mL Mouth Rinse BID  . clopidogrel  75 mg Per Tube QAC breakfast  . enoxaparin (LOVENOX) injection  40 mg Subcutaneous Q24H  . famotidine  20 mg Oral BID  . feeding supplement (PRO-STAT SUGAR FREE 64)  30 mL Per Tube BID  . haloperidol lactate  2 mg Intravenous Q8H  . insulin aspart  0-15 Units Subcutaneous Q4H  . mouth rinse  15 mL Mouth Rinse BID  . nutrition supplement (JUVEN)  1 packet Per Tube BID BM  . sodium chloride flush  10-40 mL Intracatheter Q12H   Continuous Infusions: . sodium chloride 10 mL/hr at 04/02/19 1200  . dextrose 50 mL/hr at 04/03/19 0800  . feeding supplement (OSMOLITE 1.5 CAL) Stopped (04/01/19 2300)  . vancomycin 1,000 mg (04/03/19 0802)     LOS: 23 days   Cherene Altes, MD [facility] Office  [phone] Pager - Text Page per Amion  If 7PM-7AM, please contact night-coverage per Amion 04/03/2019, 9:34 AM

## 2019-04-03 NOTE — Progress Notes (Signed)
Assisted televisit to patient with daughter. 

## 2019-04-04 ENCOUNTER — Inpatient Hospital Stay (HOSPITAL_COMMUNITY): Payer: 59

## 2019-04-04 LAB — BASIC METABOLIC PANEL
Anion gap: 10 (ref 5–15)
BUN: 8 mg/dL (ref 8–23)
CO2: 22 mmol/L (ref 22–32)
Calcium: 8.5 mg/dL — ABNORMAL LOW (ref 8.9–10.3)
Chloride: 109 mmol/L (ref 98–111)
Creatinine, Ser: 0.87 mg/dL (ref 0.61–1.24)
GFR calc Af Amer: >60 mL/min (ref >60)
GFR calc non Af Amer: >60 mL/min (ref >60)
Glucose, Bld: 156 mg/dL — ABNORMAL HIGH (ref 70–99)
Potassium: 3.5 mmol/L (ref 3.5–5.1)
Sodium: 141 mmol/L (ref 135–145)

## 2019-04-04 LAB — GLUCOSE, CAPILLARY
Glucose-Capillary: 115 mg/dL — ABNORMAL HIGH (ref 70–99)
Glucose-Capillary: 119 mg/dL — ABNORMAL HIGH (ref 70–99)
Glucose-Capillary: 132 mg/dL — ABNORMAL HIGH (ref 70–99)
Glucose-Capillary: 143 mg/dL — ABNORMAL HIGH (ref 70–99)
Glucose-Capillary: 151 mg/dL — ABNORMAL HIGH (ref 70–99)
Glucose-Capillary: 161 mg/dL — ABNORMAL HIGH (ref 70–99)

## 2019-04-04 MED ORDER — OSMOLITE 1.5 CAL PO LIQD
1000.0000 mL | ORAL | Status: DC
  Administered 2019-04-04: 22:00:00 1000 mL
  Filled 2019-04-04 (×2): qty 1000

## 2019-04-04 MED ORDER — MORPHINE SULFATE (PF) 2 MG/ML IV SOLN
1.0000 mg | INTRAVENOUS | Status: DC | PRN
  Administered 2019-04-10 – 2019-04-11 (×3): 1 mg via INTRAVENOUS
  Filled 2019-04-04 (×3): qty 1

## 2019-04-04 MED ORDER — MORPHINE SULFATE (PF) 2 MG/ML IV SOLN: 1.0000 mg | INTRAVENOUS | Status: DC | PRN

## 2019-04-04 NOTE — Progress Notes (Signed)
Physical Therapy Treatment Patient Details Name: Benjamin Cohen MRN: [id number] DOB: 09/17/1956 Today's Date: 04/04/2019    History of Present Illness 63 y.o. male admitted for acute respiratory failure (+) COVID 19, s/p intubation 4/2-4/11, re-intubated 4/17-4/20/20.  Pt with significant PMH of CVA and HTN.      PT Comments    Pt with slow progress with mobility.  Continue to recommend rehab if able.  Follow Up Recommendations  CIR     Equipment Recommendations  Rolling walker with 5" wheels;3in1 (PT);Wheelchair (measurements PT);Wheelchair cushion (measurements PT)    Recommendations for Other Services Rehab consult     Precautions / Restrictions Precautions Precautions: Fall    Mobility  Bed Mobility Overal bed mobility: Needs Assistance Bed Mobility: Supine to Sit     Supine to sit: +2 for physical assistance;Max assist;HOB elevated     General bed mobility comments: Assist to bring legs off of bed, elevate trunk into sitting and bring hips to EOB.  Transfers Overall transfer level: Needs assistance Equipment used: Rolling walker (2 wheeled) Transfers: Sit to/from [employer] Sit to Stand: +2 physical assistance;From elevated surface;Mod assist Stand pivot transfers: +2 physical assistance;Mod assist       General transfer comment: Assist to bring hips up and for balance. With walker pt took pivot steps with walker.  Ambulation/Gait Ambulation/Gait assistance: +2 physical assistance;Mod assist Gait Distance (Feet): 2 Feet Assistive device: Rolling walker (2 wheeled) Gait Pattern/deviations: Step-through pattern;Decreased step length - right;Decreased step length - left;Shuffle;Trunk flexed Gait velocity: decreased Gait velocity interpretation: <1.31 ft/sec, indicative of household ambulator General Gait Details: Pt with shuffling pivotal steps from bed to chair. Verbal/tactile cues to stand more erect   Stairs              Wheelchair Mobility    Modified Rankin (Stroke Patients Only)       Balance Overall balance assessment: Needs assistance Sitting-balance support: Feet supported;Bilateral upper extremity supported Sitting balance-Leahy Scale: Poor Sitting balance - Comments: Sat EOB with min to mod assist with very brief periods (<5 sec) of min guard Postural control: Posterior lean;Right lateral lean Standing balance support: Bilateral upper extremity supported Standing balance-Leahy Scale: Poor Standing balance comment: Walker and +2 mod assist for static standing                            Cognition Arousal/Alertness: Lethargic Behavior During Therapy: Flat affect Overall Cognitive Status: Impaired/Different from baseline Area of Impairment: Attention;Memory;Following commands;Safety/judgement;Awareness;Problem solving;Orientation                 Orientation Level: Disoriented to;Time;Situation Current Attention Level: Sustained Memory: Decreased recall of precautions;Decreased short-term memory Following Commands: Follows one step commands with increased time;Follows one step commands inconsistently Safety/Judgement: Decreased awareness of safety;Decreased awareness of deficits Awareness: Emergent Problem Solving: Slow processing;Decreased initiation;Difficulty sequencing;Requires verbal cues;Requires tactile cues General Comments: Required frequent verbal/tactile stimulation to stay alert      Exercises      General Comments        Pertinent Vitals/Pain Pain Assessment: Faces Faces Pain Scale: No hurt    Home Living                      Prior Function            PT Goals (current goals can now be found in the care plan section) Progress towards PT goals: Not progressing toward goals - comment  Frequency    Min 4X/week      PT Plan Current plan remains appropriate    Co-evaluation              AM-PAC PT "6 Clicks" Mobility    Outcome Measure  Help needed turning from your back to your side while in a flat bed without using bedrails?: A Lot Help needed moving from lying on your back to sitting on the side of a flat bed without using bedrails?: A Lot Help needed moving to and from a bed to a chair (including a wheelchair)?: A Lot Help needed standing up from a chair using your arms (e.g., wheelchair or bedside chair)?: A Lot Help needed to walk in hospital room?: A Lot Help needed climbing 3-5 steps with a railing? : Total 6 Click Score: 11    End of Session Equipment Utilized During Treatment: Oxygen;Other (comment)(gait belt) Activity Tolerance: Patient limited by fatigue;Patient limited by lethargy Patient left: in chair;with call bell/phone within reach Nurse Communication: Mobility status PT Visit Diagnosis: Difficulty in walking, not elsewhere classified (R26.2);Muscle weakness (generalized) (M62.81)     Time: [id number] PT Time Calculation (min) (ACUTE ONLY): 36 min  Charges:  $Therapeutic Activity: 23-37 mins                     [facility] Talisha Cohen PT Acute Rehabilitation Services Pager [phone] Office [phone]    Benjamin Cohen 04/04/2019, 12:52 PM

## 2019-04-04 NOTE — Progress Notes (Signed)
Nutrition Follow-up  DOCUMENTATION CODES:   Obesity unspecified  INTERVENTION:   Consider PEG placement for nutrition.  Resume TF as able:   Osmolite 1.5 at 65 ml/h  Pro-stat 30 ml BID  Provides 2540 kcal, 128 gm protein, 1186 ml free water daily   Juven BID, each packet provides 80 calories, 8 grams of carbohydrate, 2.5  grams of protein (collagen), 7 grams of L-arginine and 7 grams of L-glutamine; supplement contains CaHMB, Vitamins C, E, B12 and Zinc to promote wound healing  NUTRITION DIAGNOSIS:   Inadequate oral intake related to inability to eat as evidenced by NPO status.  Ongoing  GOAL:   Patient will meet greater than or equal to 90% of their needs  Unmet  MONITOR:   TF tolerance, Labs, Weight trends, Skin  ASSESSMENT:   63 year-old male admitted 3/31 with reports of fever, cough, SOB and confusion.  He was recently at a wedding mid-March and after multiple family members were diagnosed with COVID-19. Patient had been found on the floor and unable to get for several days. He developed worsening hypoxemia, respiratory distress 4/1 and PCCM consulted for evaluation. CXR showed patchy bilateral infiltrates. He was screened for COVID in the ED on 3/31 and treated empirically for CAP.  Patient remains NPO per SLP. Suspected underlying dysphagia. Will likely need to be NPO for a prolonged amount of time. SLP continues to follow for ability to begin PO diet.  NGT placed and TF started 4/21. Patient pulled NGT 4/22 early AM. He currently does not have enteral access. TF off.   Nutritional status is quickly declining with no nutrition intake since 4/22.  Labs and medications reviewed. CBG's: 161-132-119  IVF: D5 1/2 NS with 20 mEq KCl/L at 50 ml/h  Diet Order:   Diet Order            Diet NPO time specified  Diet effective now              EDUCATION NEEDS:   Not appropriate for education at this time  Skin:  Skin Assessment: Skin Integrity  Issues: Skin Integrity Issues:: DTI, Other (Comment) DTI: buttock, heel Stage II: R face (new 4/12) Other: skin tears, MASD  Last BM:  4/21  Height:   Ht Readings from Last 1 Encounters:  03/19/19 5\' 7"  (1.702 m)    Weight:   Wt Readings from Last 1 Encounters:  04/04/19 103.3 kg    Ideal Body Weight:  67.27 kg  BMI:  Body mass index is 35.67 kg/m.  Estimated Nutritional Needs:   Kcal:  2300-2500 kcal  Protein:  120-143 g  Fluid:  >/= 2.2 L/day    Joaquin Courts, RD, LDN, CNSC Pager [phone] After Hours Pager [phone]

## 2019-04-04 NOTE — TOC Progression Note (Signed)
Transition of Care [facility]) - Progression Note    Patient Details  Name: Benjamin Cohen MRN: [id number] Date of Birth: May 16, 1956  Transition of Care [facility]) CM/SW Contact  Colleen Can RN, BSN, NCM-BC, ACM-RN [phone] (working remotely) Phone Number: 04/04/2019, 3:20 PM  Clinical Narrative:    63 yo male transferred to [facility] from [place]; COVID 19 (+). PT/OT eval complete with CIR recommended; Rehab AC following for appropriateness. CM team will continue to follow for dispositional needs.    Expected Discharge Plan: IP Rehab Facility Barriers to Discharge: Continued Medical Work up  Expected Discharge Plan and Services Expected Discharge Plan: IP Rehab Facility   Discharge Planning Services: CM Consult     Expected Discharge Date: (unknown)

## 2019-04-04 NOTE — Progress Notes (Signed)
Patient having some periods of Apnea . Vitals are as follows BP 155/98 hr 115 O2sats@ 97% on RA. I have paged covierng HSP via amion and informed him of above noted status. I will continue to monitor PT as the shift progresses

## 2019-04-04 NOTE — Progress Notes (Addendum)
Inpatient Rehab Admissions:  Inpatient Rehab Consult received.  I spoke with the pt via phone for rehabilitation assessment and to discuss goals and expectations of an inpatient rehab admission. Pt showed initial interest in program and gave [facility] permission to discuss further with his ex-wife. AC awaits call back from his ex-wife to determine caregiver support and further interest in CIR program.   [facility] will follow up Monday.   Please call if questions.   Nanine Means, OTR/L  Rehab Admissions Coordinator  [phone] 04/04/2019 4:41 PM  ADDENDUM: 5:43PM: spoke with ex-wife via phone who confirmed plan for pt to return to [place] with her and family once he has completed rehab. AC to continue to follow up with pt and his ex-wife on Monday to discuss insurance benefits.   Nanine Means, OTR/L  Rehab Admissions Coordinator  [phone] 04/04/2019 5:45 PM

## 2019-04-04 NOTE — Progress Notes (Addendum)
[facility] TEAM 1 - Stepdown/ICU TEAM  Benjamin Cohen  QXI:[id number] DOB: [birth year] DOA: 03/11/2019 PCP: Maude Leriche, PA-C    Brief Narrative:  63 y/o M admitted 3/31 with fever, cough, SOB and confusion. He attended a wedding mid March, after which multiple family members were diagnosed with COVID including his wife. He developed worsening hypoxemia w/ respiratory distress 4/1 and PCCM was consulted for evaluation. Intubated 4/2 with ARDS. Extubated 4/12. Had to be reintubated 4/17.   Significant Events: 3/31 Admit, 2L, bilateral patchy infiltrates  4/01 PCCM consulted 4/02 Intubated 4/03 toclizamab x 1 4/05 Net positive, remains on paralytic, still prone 4/06 Supine, plateau pressure 23, 50% / 12 PEEP; complete 5 days of plaquenil 4/08 Plateau 23, PEEP 12 / 40% 4/14 transfer to floor bed 4/17 re-intubated   4/20 extubated 4/22 SLP eval > NPO  COVID-19 specific Treatment: Actemra x1 Plaquenil 4/1 > 4/6   Ventilator Settings: NA - extubated 4/20  Subjective: Physically seen by Dr Jolee Ewing in any distress-still somewhat lethargic  Assessment & Plan:   Acute Hypoxic Resp Failure - Covid 19 associated ARDS - MRSA Pneumonia:Remains relatively stable-on room air-has completed IV vancomycin.  Continue to monitor closely.  Acute metabolic encephalopathy - ICU delirium: Lethargic-we will change Haldol to as needed.  Follow for now.  Continue supportive care.  Dysphagia, post extubation: Also has a history of CVA-probably has dysphagia due to multifactorial causes-intubation/deconditioning/prior history of CVA-unfortunately has pulled out NG tube multiple times-awaiting repeat SLP evaluation.  Continue to keep n.p.o.  Continue cautious hydration.  Addendum: Reevaluated by speech therapy this afternoon-unfortunately unable to take any oral intake-we will place order to see if we can place NG tube-since he has pulled out multiple times-we will try and use restraints.  I have  ordered NG feedings once x-ray abdomen confirms NG tube in place.  HTN: Relatively stable-continue PRN hydralazine.  History of CVA: We will resume Plavix and statin when enteral intake is possible.  Hypokalemia: Repleted-continue recheck periodically  DM2: CBGs stable-continue with SSI.  Normocytic anemia: Secondary to acute illness-hemoglobin stable-no evidence of blood loss.  Follow periodically.  Nutrition:Need for NPO status is the limiting factor - follow as discussed above   DVT prophylaxis: lovenox  Code Status: FULL CODE Family Communication: Spouse over the phone Disposition Plan: Remain inpatient  Consultants:  PCCM  Antimicrobials:  Vanc 4/17 > 4/23  Objective: Blood pressure (!) 151/90, pulse 100, temperature 99.7 F (37.6 C), temperature source Oral, resp. rate (!) 25, height 5' 7"  (1.702 m), weight 103.3 kg, SpO2 90 %.  Intake/Output Summary (Last 24 hours) at 04/04/2019 1007 Last data filed at 04/04/2019 0515 Gross per 24 hour  Intake 753.12 ml  Output 820 ml  Net -66.88 ml   Filed Weights   04/01/19 0500 04/02/19 0500 04/04/19 0617  Weight: 104.3 kg 103.2 kg 103.3 kg    Examination: General: No acute respiratory distress - alert but lethargic  Lungs: Clear to auscultation bilaterally without wheezes or crackles Cardiovascular: Regular rate and rhythm without murmur  Abdomen: Nontender, nondistended, soft, bowel sounds positive, no rebound, no ascites, no appreciable mass Extremities: No significant cyanosis, clubbing, or edema bilateral lower extremities  CBC: Recent Labs  Lab 04/01/19 0304 04/02/19 0300 04/03/19 0335 04/03/19 0435  WBC 5.6 4.9 5.6  --   HGB 9.3* 9.7* 10.8* 9.9*  HCT 30.9* 31.5* 34.8* 29.0*  MCV 90.1 88.0 86.4  --   PLT 152 184 242  --    Basic Metabolic  Panel: Recent Labs  Lab 03/30/19 0310  04/01/19 0304 04/02/19 0300 04/03/19 0335 04/03/19 0435 04/04/19 0430  NA 149*   < > 149* 146* 141 140 141  K 3.1*   <  > 3.6 3.2* 3.1* 3.1* 3.5  CL 115*   < > 116* 114* 108  --  109  CO2 25   < > 26 24 22   --  22  GLUCOSE 212*   < > 125* 161* 164*  --  156*  BUN 52*   < > 21 20 12   --  8  CREATININE 1.41*   < > 0.79 0.75 0.77  --  0.87  CALCIUM 8.4*   < > 8.2* 8.0* 8.1*  --  8.5*  MG 2.7*  --  2.5*  --  2.1  --   --    < > = values in this interval not displayed.   GFR: Estimated Creatinine Clearance: 97 mL/min (by C-G formula based on SCr of 0.87 mg/dL).  Liver Function Tests: Recent Labs  Lab 03/29/19 0434 04/01/19 0304 04/03/19 0335  AST 27 26 24   ALT 28 29 28   ALKPHOS 103 101 93  BILITOT 0.9 0.7 0.9  PROT 7.5 6.8 6.9  ALBUMIN 3.2* 2.6* 2.7*   HbA1C: Hgb A1c MFr Bld  Date/Time Value Ref Range Status  03/11/2019 03:38 PM 12.4 (H) 4.8 - 5.6 % Final    Comment:    (NOTE) Pre diabetes:          5.7%-6.4% Diabetes:              >6.4% Glycemic control for   <7.0% adults with diabetes   04/30/2018 01:28 AM 11.5 (H) 4.8 - 5.6 % Final    Comment:    (NOTE) Pre diabetes:          5.7%-6.4% Diabetes:              >6.4% Glycemic control for   <7.0% adults with diabetes     CBG: Recent Labs  Lab 04/03/19 1104 04/03/19 1612 04/03/19 1948 04/04/19 0020 04/04/19 0506  GLUCAP 127* 131* 119* 143* 161*    Recent Results (from the past 240 hour(s))  Culture, respiratory (non-expectorated)     Status: None   Collection Time: 03/28/19  3:58 PM  Result Value Ref Range Status   Specimen Description   Final    TRACHEAL ASPIRATE Performed at [facility], [place] [address] [place], [place] [address]    Special Requests   Final    NONE Performed at [facility], [place] [address], [place], [place] [address]    Gram Stain   Final    ABUNDANT WBC PRESENT,BOTH PMN AND MONONUCLEAR ABUNDANT GRAM POSITIVE COCCI IN PAIRS Performed at [facility], [place] [address], [place], [place] [address]    Culture   Final    MODERATE METHICILLIN RESISTANT  STAPHYLOCOCCUS AUREUS   Report Status 03/30/2019 FINAL  Final   Organism ID, Bacteria METHICILLIN RESISTANT STAPHYLOCOCCUS AUREUS  Final      Susceptibility   Methicillin resistant staphylococcus aureus - MIC*    CIPROFLOXACIN >=8 RESISTANT Resistant     ERYTHROMYCIN >=8 RESISTANT Resistant     GENTAMICIN <=0.5 SENSITIVE Sensitive     OXACILLIN >=4 RESISTANT Resistant     TETRACYCLINE <=1 SENSITIVE Sensitive     VANCOMYCIN 1 SENSITIVE Sensitive     TRIMETH/SULFA <=10 SENSITIVE Sensitive     CLINDAMYCIN <=0.25 SENSITIVE Sensitive  RIFAMPIN <=0.5 SENSITIVE Sensitive     Inducible Clindamycin NEGATIVE Sensitive     * MODERATE METHICILLIN RESISTANT STAPHYLOCOCCUS AUREUS     Scheduled Meds: . chlorhexidine gluconate (MEDLINE KIT)  15 mL Mouth Rinse BID  . enoxaparin (LOVENOX) injection  40 mg Subcutaneous Q24H  . haloperidol lactate  1 mg Intravenous Q8H  . insulin aspart  0-15 Units Subcutaneous Q4H  . mouth rinse  15 mL Mouth Rinse BID  . sodium chloride flush  10-40 mL Intracatheter Q12H   Continuous Infusions: . sodium chloride 10 mL/hr at 04/02/19 1200  . dextrose 5 % and 0.45 % NaCl with KCl 20 mEq/L 50 mL/hr at 04/03/19 2200     LOS: 24 days   Cherene Altes, MD [facility] Office  [phone] Pager - Text Page per Amion  If 7PM-7AM, please contact night-coverage per Amion 04/04/2019, 10:07 AM

## 2019-04-04 NOTE — Evaluation (Signed)
Speech Language Pathology Evaluation Patient Details Name: Benjamin Cohen MRN: [id number] DOB: 03/01/1956 Today's Date: 04/04/2019 Time: 7017-7939 SLP Time Calculation (min) (ACUTE ONLY): 15 min  Problem List:  Patient Active Problem List   Diagnosis Date Noted  . Staphylococcal pneumonia (HCC)   . COVID-19   . Dysphagia   . Pressure injury of skin 03/23/2019  . Acute respiratory failure with hypoxia (HCC) 03/11/2019  . Noncompliance   . History of stroke   . Cerebral thrombosis with cerebral infarction 04/29/2018  . CVA (cerebral vascular accident) (HCC) 04/29/2018  . HLD (hyperlipidemia) 01/04/2015  . Diabetes (HCC) 11/14/2013  . Cerebral infarction (HCC) 11/12/2013  . Hypertensive urgency 11/12/2013   Past Medical History:  Past Medical History:  Diagnosis Date  . Hypertension   . Stroke [facility])    Past Surgical History:  Past Surgical History:  Procedure Laterality Date  . NO PAST SURGERIES     HPI:  63 y/o M admitted 3/31 COVID (+).Developed worsening hypoxemia, respiratory distress 4/1 and PCCM consulted for evaluation. Intubated 4/2 with ARDS.Was extubated 03/23/2019, with no oxygen requirement. Initial clinical swallow evaluation 4/13 revealed concerns for severe dysphagia; NPO was recommended, TF.  Pt developed respiratory distress and was reintubated on 03/28/2019; extubated 4/20. Hx prior strokes.    Assessment / Plan / Recommendation Clinical Impression  Pt has delayed processing and limited verbal output. He responds more to yes/no questions than to open-ended ones, but when he does respond to open-ended questions, it is often a single word (or very short phrase) response. He has reduced eye contact and a flat affect. His sustained attention is limited, resulting in reduced storage of new information. His speech is difficult to understand, although this may be in large part related to hypophonia s/p prolonged intubation perhaps combined with any baseline weakness  from prior CVAs. Recommend that receive additional SLP f/u acutely and upon discharge to maximize functional independence.    SLP Assessment  SLP Recommendation/Assessment: Patient needs continued Speech Lanaguage Pathology Services SLP Visit Diagnosis: Cognitive communication deficit (R41.841)    Follow Up Recommendations  Inpatient Rehab    Frequency and Duration min 2x/week  2 weeks      SLP Evaluation Cognition  Overall Cognitive Status: Impaired/Different from baseline Arousal/Alertness: Awake/alert Orientation Level: Oriented to person;Oriented to place;Disoriented to time;Oriented to situation Attention: Sustained Sustained Attention: Impaired Sustained Attention Impairment: Verbal basic Memory: Impaired Memory Impairment: Storage deficit;Retrieval deficit;Decreased recall of new information Awareness: Impaired Awareness Impairment: Intellectual impairment       Comprehension  Auditory Comprehension Overall Auditory Comprehension: Impaired Commands: Impaired One Step Basic Commands: 75-100% accurate Interfering Components: Attention;Processing speed    Expression Expression Primary Mode of Expression: Verbal Verbal Expression Overall Verbal Expression: (needs more assessment - limited verbal output)   Oral / Motor  Oral Motor/Sensory Function Overall Oral Motor/Sensory Function: Generalized oral weakness(asymmetry attributable to prior stroke) Motor Speech Overall Motor Speech: (hypophonia, likely secondary to intubation)   GO                    Virl Axe Laithan Conchas 04/04/2019, 5:51 PM  Ivar Drape, M.A. CCC-SLP Acute Herbalist [phone] Office [phone]

## 2019-04-04 NOTE — Progress Notes (Signed)
OT Progress Note  Session limited by lethargy. Pt apparently had IV morphine prior to session. Lethargy is limiting ability to participate with OT/PT at this time. Required use of Stedy +2 A to transfer back to bed; Max to total A with ADL. Baby monitor set up at end of session due to pt being a fall risk. Bronson Ing (ex-wife) called at end of session and updated on progress. Continue to recommend CIR for rehab. Will continue to follow acutely.      04/04/19 1400  OT Visit Information  Last OT Received On 04/04/19  Assistance Needed +2  History of Present Illness 63 y.o. male admitted for acute respiratory failure (+) COVID 19, s/p intubation 4/2-4/11, re-intubated 4/17-4/20/20.  Pt with significant PMH of CVA and HTN.    Precautions  Precautions Fall  Pain Assessment  Pain Assessment Faces  Faces Pain Scale 4  Pain Location back  Pain Descriptors / Indicators Discomfort;Grimacing  Pain Intervention(s) Limited activity within patient's tolerance;Premedicated before session (Morphine)  Cognition  Arousal/Alertness Lethargic  Behavior During Therapy Flat affect;Impulsive;Restless  Overall Cognitive Status Impaired/Different from baseline  Area of Impairment Orientation;Attention;Memory;Following commands;Safety/judgement;Awareness;Problem solving  Orientation Level Disoriented to;Time  Current Attention Level Sustained  Following Commands Follows one step commands with increased time  Safety/Judgement Decreased awareness of safety;Decreased awareness of deficits  Awareness Emergent  Problem Solving Slow processing;Decreased initiation;Difficulty sequencing;Requires verbal cues;Requires tactile cues  General Comments Lethargic and difficulty sustaining attention to task; Able to follow 1 step commands with increased time. Most likely also affected by meds  Upper Extremity Assessment  Upper Extremity Assessment Generalized weakness  RUE Deficits / Details using R UE functionally  RUE  Coordination decreased fine motor;decreased gross motor  LUE Deficits / Details Appears weaker and more incoordination than R; history of CVA; unsure of baseline use of LUE PTA; Unable to maintain grasp on Stedy  LUE Coordination decreased fine motor;decreased gross motor  Lower Extremity Assessment  Lower Extremity Assessment Defer to PT evaluation  ADL  Overall ADL's  Needs assistance/impaired  Eating/Feeding NPO  Grooming Maximal assistance;Sitting  Upper Body Bathing Maximal assistance;Sitting  Lower Body Bathing Total assistance;Sit to/from stand  Upper Body Dressing  Total assistance;Sitting  Lower Body Dressing Total assistance;Sit to/from stand  Toileting- Architect and Hygiene Total assistance  Functional mobility during ADLs Maximal assistance;+2 for physical assistance;Cueing for safety;Cueing for sequencing (with use of STedy)  General ADL Comments Stedy required to transfer tp out of chair to bed; incontinenet  - no awareness of being incontinenet  Bed Mobility  Overal bed mobility Needs Assistance  Sit to supine Max assist;+2 for physical assistance  Balance  Sitting balance-Leahy Scale Poor  Sitting balance - Comments R lateral lean in chair  Standing balance-Leahy Scale Poor  Transfers  Overall transfer level Needs assistance  Sit to Stand +2 physical assistance;Max assist (from recliner`)  General transfer comment required use of Stedy  General Exercises - Upper Extremity  Shoulder Flexion Strengthening;Both;20 reps  Elbow Flexion Strengthening;Both;20 reps  OT - End of Session  Equipment Utilized During Treatment Other (comment) Antony Salmon)  Activity Tolerance Patient limited by lethargy;Patient tolerated treatment well  Patient left in bed;with call bell/phone within reach;with bed alarm set;Other (comment) (Baby alarm set up)  Nurse Communication Mobility status  OT Assessment/Plan  OT Plan Discharge plan remains appropriate  OT Visit Diagnosis  Other abnormalities of gait and mobility (R26.89);Muscle weakness (generalized) (M62.81);Pain;Other symptoms and signs involving cognitive function  Pain - part of body  (back)  OT Frequency (ACUTE ONLY) Min 3X/week  Recommendations for Other Services Rehab consult  Follow Up Recommendations CIR;Supervision/Assistance - 24 hour  OT Equipment 3 in 1 bedside commode  AM-PAC OT "6 Clicks" Daily Activity Outcome Measure (Version 2)  Help from another person eating meals? 1  Help from another person taking care of personal grooming? 2  Help from another person toileting, which includes using toliet, bedpan, or urinal? 1  Help from another person bathing (including washing, rinsing, drying)? 2  Help from another person to put on and taking off regular upper body clothing? 1  Help from another person to put on and taking off regular lower body clothing? 1  6 Click Score 8  OT Goal Progression  Progress towards OT goals Not progressing toward goals - comment;OT to reassess next treatment  Acute Rehab OT Goals  [facility]  OT Goal Formulation With patient  Time For Goal Achievement 04/15/19  Potential to Achieve Goals Good  ADL Goals  Pt Will Perform Grooming with modified independence;sitting  Pt Will Transfer to Toilet bedside commode;with min assist;stand pivot transfer  Additional ADL Goal #1 pt will perform UB adls with mod A seated  Additional ADL Goal #2 pt will go from sit to stand with mod +2 assist for adls  Additional ADL Goal #3 Pt will tolerate A/AAROM to bil UEs to strengthening arms for adls and toilet transfers  Pt Will Perform Upper Body Bathing with set-up;with supervision;sitting  Pt Will Perform Lower Body Bathing with min assist;sit to/from stand  OT Time Calculation  OT Start Time (ACUTE ONLY) 1300  OT Stop Time (ACUTE ONLY) 1330  OT Time Calculation (min) 30 min  OT General Charges  $OT Visit 1 Visit  OT Treatments  $Self  Care/Home Management  23-37 mins  Luisa DagoHilary Cate Oravec, OT/L   Acute OT Clinical Specialist Acute Rehabilitation Services Pager [phone] Office [phone]

## 2019-04-04 NOTE — Progress Notes (Addendum)
  Speech Language Pathology Treatment: Dysphagia  Patient Details Name: Benjamin Cohen MRN: [id number] DOB: 09-Jul-1956 Today's Date: 04/04/2019 Time: 1520-1530 SLP Time Calculation (min) (ACUTE ONLY): 10 min  Assessment / Plan / Recommendation Clinical Impression  Pt has persistent, wet vocal quality at baseline, similar to previous date, but today he is requiring supplemental O2 via nasal cannula. Oral care and small amounts of ice/water were provided to utilize swallowing musculature and assist in clearing secretions. Coughing was more pronounced with thin liquids compared to ice chips, but overall his voice sounded clearer after a few trials. Pt denies any prior h/o dysphagia, although unclear how reliable this hx is. It is also possible that he was able to compensate well PTA, but in his current state, he is not. Would consider utilizing temporary alternative means of nutrition with additional SLP f/u to maximize swallowing safety.   HPI HPI: 63 y/o M admitted 3/31 COVID (+).Developed worsening hypoxemia, respiratory distress 4/1 and PCCM consulted for evaluation. Intubated 4/2 with ARDS.Was extubated 03/23/2019, with no oxygen requirement. Initial clinical swallow evaluation 4/13 revealed concerns for severe dysphagia; NPO was recommended, TF.  Pt developed respiratory distress and was reintubated on 03/28/2019; extubated 4/20. Hx prior strokes.       SLP Plan  Continue with current plan of care       Recommendations  Diet recommendations: NPO Medication Administration: Via alternative means                Oral Care Recommendations: Oral care QID Follow up Recommendations: Inpatient Rehab SLP Visit Diagnosis: Dysphagia, unspecified (R13.10) Plan: Continue with current plan of care       GO                Virl Axe Cniyah Sproull 04/04/2019, 5:31 PM  Ivar Drape, M.A. CCC-SLP Acute Herbalist [phone] Office [phone]

## 2019-04-05 ENCOUNTER — Inpatient Hospital Stay (HOSPITAL_COMMUNITY): Payer: 59

## 2019-04-05 LAB — BASIC METABOLIC PANEL
Anion gap: 10 (ref 5–15)
BUN: 7 mg/dL — ABNORMAL LOW (ref 8–23)
CO2: 22 mmol/L (ref 22–32)
Calcium: 8.4 mg/dL — ABNORMAL LOW (ref 8.9–10.3)
Chloride: 108 mmol/L (ref 98–111)
Creatinine, Ser: 0.84 mg/dL (ref 0.61–1.24)
GFR calc Af Amer: >60 mL/min (ref >60)
GFR calc non Af Amer: >60 mL/min (ref >60)
Glucose, Bld: 178 mg/dL — ABNORMAL HIGH (ref 70–99)
Potassium: 3.4 mmol/L — ABNORMAL LOW (ref 3.5–5.1)
Sodium: 140 mmol/L (ref 135–145)

## 2019-04-05 LAB — CBC
HCT: 33.4 % — ABNORMAL LOW (ref 39.0–52.0)
Hemoglobin: 10.1 g/dL — ABNORMAL LOW (ref 13.0–17.0)
MCH: 26.2 pg (ref 26.0–34.0)
MCHC: 30.2 g/dL (ref 30.0–36.0)
MCV: 86.8 fL (ref 80.0–100.0)
Platelets: 353 10*3/uL (ref 150–400)
RBC: 3.85 MIL/uL — ABNORMAL LOW (ref 4.22–5.81)
RDW: 13.3 % (ref 11.5–15.5)
WBC: 6.7 10*3/uL (ref 4.0–10.5)
nRBC: 0 % (ref 0.0–0.2)

## 2019-04-05 LAB — GLUCOSE, CAPILLARY
Glucose-Capillary: 130 mg/dL — ABNORMAL HIGH (ref 70–99)
Glucose-Capillary: 154 mg/dL — ABNORMAL HIGH (ref 70–99)
Glucose-Capillary: 159 mg/dL — ABNORMAL HIGH (ref 70–99)
Glucose-Capillary: 174 mg/dL — ABNORMAL HIGH (ref 70–99)
Glucose-Capillary: 175 mg/dL — ABNORMAL HIGH (ref 70–99)
Glucose-Capillary: 180 mg/dL — ABNORMAL HIGH (ref 70–99)

## 2019-04-05 MED ORDER — POTASSIUM CHLORIDE 20 MEQ/15ML (10%) PO SOLN
40.0000 meq | Freq: Once | ORAL | Status: AC
  Administered 2019-04-05: 08:00:00 40 meq via ORAL
  Filled 2019-04-05: qty 30

## 2019-04-05 MED ORDER — AMLODIPINE BESYLATE 5 MG PO TABS
5.0000 mg | ORAL_TABLET | Freq: Every day | ORAL | Status: DC
  Administered 2019-04-05 – 2019-04-11 (×6): 5 mg
  Filled 2019-04-05 (×6): qty 1

## 2019-04-05 MED ORDER — ATORVASTATIN CALCIUM 40 MG PO TABS
40.0000 mg | ORAL_TABLET | Freq: Every day | ORAL | Status: DC
  Administered 2019-04-05 – 2019-04-10 (×5): 40 mg
  Filled 2019-04-05 (×6): qty 1

## 2019-04-05 MED ORDER — CLOPIDOGREL BISULFATE 75 MG PO TABS
75.0000 mg | ORAL_TABLET | Freq: Every day | ORAL | Status: DC
  Administered 2019-04-05 – 2019-04-11 (×6): 75 mg
  Filled 2019-04-05 (×7): qty 1

## 2019-04-05 NOTE — Progress Notes (Signed)
Patient was combative with care. Making multiple attempts to pull out his NGT. RN attempted  to speak with patient about the need for tube to help with supportive care and its use to promote healing. PT verbalized understanding but continued to pull on tube. Per previous notes patient has had several NGT's which he forcefully pulled out in the past. Patient has a current order for restrains. I will at this time initiate wrist restraints to avoid gtube been forcefully  pulled out. restraint initiated, site wrist checked for finger ability to be placed under restrains to assess tightness, hand look well colored with good blood perfusion. Patient assess for tioleting and comfort/ he declined any discomfort or voiced any needs . I will continue to monitor patient as the shift progresses

## 2019-04-05 NOTE — Progress Notes (Signed)
[facility] TEAM 1 - Stepdown/ICU TEAM  Benjamin Cohen  VFI:[id number] DOB: [birth year] DOA: 03/11/2019 PCP: Maude Leriche, PA-C    Brief Narrative:  63 y/o M admitted 3/31 with fever, cough, SOB and confusion. He attended a wedding mid March, after which multiple family members were diagnosed with COVID including his wife. He developed worsening hypoxemia w/ respiratory distress 4/1 and PCCM was consulted for evaluation. Intubated 4/2 with ARDS. Extubated 4/12. Had to be reintubated 4/17.   Significant Events: 3/31 Admit, 2L, bilateral patchy infiltrates  4/01 PCCM consulted 4/02 Intubated 4/03 toclizamab x 1 4/05 Net positive, remains on paralytic, still prone 4/06 Supine, plateau pressure 23, 50% / 12 PEEP; complete 5 days of plaquenil 4/08 Plateau 23, PEEP 12 / 40% 4/14 transfer to floor bed 4/17 re-intubated   4/20 extubated 4/22 SLP eval > NPO 4/24 NG tube reinserted and NG feedings started  COVID-19 specific Treatment: Actemra x1 Plaquenil 4/1 > 4/6   Ventilator Settings: NA - extubated 4/20  Subjective: Awake-NG tube in place-actually follows some commands today.  Able to tell me his name, his wife's name-and moves all 4 extremities when I asked him.  Speech appears somewhat slurred.  Assessment & Plan: Acute Hypoxic Resp Failure - Covid 19 associated ARDS - MRSA Pneumonia: Remains relatively stable-remains on room air-has completed a course of empiric IV vancomycin.  Continue to provide supportive care.   Acute metabolic encephalopathy - ICU delirium: Has improved this morning-although confused-is markedly better than just yesterday.  Minimize narcotics and Haldol as much as possible.  Continue supportive care.   Dysphagia, post extubation: NG tube feedings restarted on 4/24-while in the ICU he repeatedly pulled out his NG tube.  Hopefully with improvement in mental status-he will tolerate NG tube feedings.  Speech therapy following-we will await further  recommendations-resume oral feedings when okay with SLP.   HTN: BP creeping up-we will start amlodipine and follow.  History of CVA: Difficult exam but seems to be moving all 4 extremities-since NG tube in place-resume Plavix and statin.    Hypokalemia: Replete and recheck  DM2: CBGs stable-continue with SSI.  Normocytic anemia: Secondary to acute illness-hemoglobin stable-no evidence of blood loss.  Follow periodically.  Nutrition: Started on NG feedings on 4/24  DVT prophylaxis: lovenox  Code Status: FULL CODE Family Communication: Spouse over the phone Disposition Plan: Remain inpatient  Consultants:  PCCM  Antimicrobials:  Vanc 4/17 > 4/23  Objective: Blood pressure 138/78, pulse 95, temperature 99.6 F (37.6 C), temperature source Oral, resp. rate (!) 25, height 5' 7"  (1.702 m), weight 103 kg, SpO2 94 %.  Intake/Output Summary (Last 24 hours) at 04/05/2019 1032 Last data filed at 04/05/2019 0359 Gross per 24 hour  Intake -  Output 700 ml  Net -700 ml   Filed Weights   04/02/19 0500 04/04/19 0617 04/05/19 0500  Weight: 103.2 kg 103.3 kg 103 kg    Examination: General appearance:Awake, mostly alert, not in any distress.  Looks very frail and deconditioned. Eyes:no scleral icterus. HEENT: Atraumatic and Normocephalic Neck: supple, no JVD. Resp:Good air entry bilaterally, some transmitted upper airway sounds CVS: S1 S2 regular, no murmurs.  GI: Bowel sounds present, Non tender and not distended with no gaurding, rigidity or rebound. Extremities: B/L Lower Ext shows no edema, both legs are warm to touch Neurology: Has generalized weakness but seems to be moving all 4 extremities. Musculoskeletal:No digital cyanosis Skin:No Rash, warm and dry  CBC: Recent Labs  Lab 04/02/19 0300 04/03/19 0335  04/03/19 0435 04/05/19 0400  WBC 4.9 5.6  --  6.7  HGB 9.7* 10.8* 9.9* 10.1*  HCT 31.5* 34.8* 29.0* 33.4*  MCV 88.0 86.4  --  86.8  PLT 184 242  --  017    Basic Metabolic Panel: Recent Labs  Lab 03/30/19 0310  04/01/19 0304  04/03/19 0335 04/03/19 0435 04/04/19 0430 04/05/19 0400  NA 149*   < > 149*   < > 141 140 141 140  K 3.1*   < > 3.6   < > 3.1* 3.1* 3.5 3.4*  CL 115*   < > 116*   < > 108  --  109 108  CO2 25   < > 26   < > 22  --  22 22  GLUCOSE 212*   < > 125*   < > 164*  --  156* 178*  BUN 52*   < > 21   < > 12  --  8 7*  CREATININE 1.41*   < > 0.79   < > 0.77  --  0.87 0.84  CALCIUM 8.4*   < > 8.2*   < > 8.1*  --  8.5* 8.4*  MG 2.7*  --  2.5*  --  2.1  --   --   --    < > = values in this interval not displayed.   GFR: Estimated Creatinine Clearance: 100.3 mL/min (by C-G formula based on SCr of 0.84 mg/dL).  Liver Function Tests: Recent Labs  Lab 04/01/19 0304 04/03/19 0335  AST 26 24  ALT 29 28  ALKPHOS 101 93  BILITOT 0.7 0.9  PROT 6.8 6.9  ALBUMIN 2.6* 2.7*   HbA1C: Hgb A1c MFr Bld  Date/Time Value Ref Range Status  03/11/2019 03:38 PM 12.4 (H) 4.8 - 5.6 % Final    Comment:    (NOTE) Pre diabetes:          5.7%-6.4% Diabetes:              >6.4% Glycemic control for   <7.0% adults with diabetes   04/30/2018 01:28 AM 11.5 (H) 4.8 - 5.6 % Final    Comment:    (NOTE) Pre diabetes:          5.7%-6.4% Diabetes:              >6.4% Glycemic control for   <7.0% adults with diabetes     CBG: Recent Labs  Lab 04/04/19 1614 04/04/19 1942 04/04/19 2351 04/05/19 0349 04/05/19 0747  GLUCAP 151* 115* 154* 175* 180*    Recent Results (from the past 240 hour(s))  Culture, respiratory (non-expectorated)     Status: None   Collection Time: 03/28/19  3:58 PM  Result Value Ref Range Status   Specimen Description   Final    TRACHEAL ASPIRATE Performed at [facility], [place] [address] [place], [place] [address]    Special Requests   Final    NONE Performed at [facility], [place] [address] [place], [place] [address]    Gram Stain   Final    ABUNDANT WBC  PRESENT,BOTH PMN AND MONONUCLEAR ABUNDANT GRAM POSITIVE COCCI IN PAIRS Performed at [facility], [place] [address] [place], [place] [address]    Culture   Final    MODERATE METHICILLIN RESISTANT STAPHYLOCOCCUS AUREUS   Report Status 03/30/2019 FINAL  Final   Organism ID, Bacteria METHICILLIN RESISTANT STAPHYLOCOCCUS AUREUS  Final      Susceptibility  Methicillin resistant staphylococcus aureus - MIC*    CIPROFLOXACIN >=8 RESISTANT Resistant     ERYTHROMYCIN >=8 RESISTANT Resistant     GENTAMICIN <=0.5 SENSITIVE Sensitive     OXACILLIN >=4 RESISTANT Resistant     TETRACYCLINE <=1 SENSITIVE Sensitive     VANCOMYCIN 1 SENSITIVE Sensitive     TRIMETH/SULFA <=10 SENSITIVE Sensitive     CLINDAMYCIN <=0.25 SENSITIVE Sensitive     RIFAMPIN <=0.5 SENSITIVE Sensitive     Inducible Clindamycin NEGATIVE Sensitive     * MODERATE METHICILLIN RESISTANT STAPHYLOCOCCUS AUREUS     Scheduled Meds: . chlorhexidine gluconate (MEDLINE KIT)  15 mL Mouth Rinse BID  . enoxaparin (LOVENOX) injection  40 mg Subcutaneous Q24H  . insulin aspart  0-15 Units Subcutaneous Q4H  . mouth rinse  15 mL Mouth Rinse BID  . sodium chloride flush  10-40 mL Intracatheter Q12H   Continuous Infusions: . sodium chloride 10 mL/hr at 04/02/19 1200  . dextrose 5 % and 0.45 % NaCl with KCl 20 mEq/L 50 mL/hr at 04/05/19 0655  . feeding supplement (OSMOLITE 1.5 CAL)       LOS: 25 days   Cherene Altes, MD [facility] Office  [phone] Pager - Text Page per Amion  If 7PM-7AM, please contact night-coverage per Amion 04/05/2019, 10:32 AM

## 2019-04-05 NOTE — Progress Notes (Signed)
Update given to wife, Bronson Ing and son

## 2019-04-05 NOTE — PMR Pre-admission (Signed)
PMR Admission Coordinator Pre-Admission Assessment  Patient: Benjamin Cohen is an 63 y.o., male MRN: [id number] DOB: 1956/01/25 Height: 5' 7"  (170.2 cm) Weight: 101.3 kg  Insurance Information HMO:     PPO: Yes     PCP:      IPA:      80/20:      OTHER:  PRIMARY: [employer]       Policy#: [id number]      Subscriber: Patient CM Name: [name]      Phone#: [phone]     Fax#: [phone] Pre-Cert#: [id number]      Employer:  [employer] provided by [name] for admit to CIR. Pt has 7 days to admit from 4/29. Initial clinicals are due 3 days after admission date to [name] (p): [phone] (f): [phone]; (pt admitted 5/1 with clinical updates due 5/4 due to weekend per [place]).  Benefits:  Phone #: NA     Name: online via [email or web address] Eff. Date: 01/11/19 still active     Deduct: $100 ($100 met)      Out of Pocket Max: $3,000 (met $217.41)      Life Max: NA CIR: $100/day co pay for days 1-5, $0/day co-pay for days 6+      SNF: $100/day co-pay for days 1-5, $0/day co-pay for days 6-60, limited to 60 days/cal year Outpatient: 80%, limited to 60 visits combined PT/OT/ST visits, reviewed for medical necessity after visit 20 is reached     Co-Pay: 20% Home Health: 80%, limited to 120 visits/cal yr      Co-Pay: 20% DME: 80%     Co-Pay: 20% Providers:  SECONDARY: Medicare part A       Policy#: [id number]      Subscriber: Patient CM Name:       Phone#:      Fax#:  Pre-Cert#:       Employer:  Benefits:  Phone #:      Name:  Eff. Date: Part A effective: 01/11/19      Deduct:       Out of Pocket Max:       Life Max:  CIR:       SNF:  Outpatient:      Co-Pay:  Home Health:       Co-Pay:  DME:      Co-Pay:   Medicaid Application Date:       Case Manager:  Disability Application Date:       Case Worker:   The "Data Collection Information Summary" for patients in Inpatient Rehabilitation Facilities with attached "Privacy Act [place] Records" was provided and verbally reviewed with:  Family  Emergency Contact Information Contact Information    Name Relation Home Work [place], [place]   [phone]   Santiago Glad Son   [phone]      Current Medical History  Patient Admitting Diagnosis: Debility due to Covid-19 History of Present Illness: Pt is a 63 yo Male with history of CVA DM2, and HTN. Pt presented on 3/31 with fever, cough, SOB, and AMS after being found lying on the floor for a few days. Pt had very high suspicion for COVID-19 as he had attended a wedding earlier that month with members who were known to be infected. A CXR showed multifocal opacities and on 4/1 he developed worsening hypoxemia with respiratory distress and on 4/2 required intubation due to ARDS. Pt was initially extubated on 4/12 but again had to be intubated on 4/17. He finally was extubated on  4/20. He required NG tube for feedings due to dysphagia. Pt completed swallow study with recommendations for DYS 2, nectar thick liquids. His course has been complicated due to delirium with pt sustaining a fall on 4/29 with 1:1 sitter ordered. Pt has received two follow up negative tests for Novel Coronavirus, with first one on 4/29 and the second one on 5/1. Pt has been evaluated by therapies with recommendation for CIR due to deconditioning secondary to acute/critial illness. Pt is to be admitted to CIR on 04/11/19.     Patient's medical record from [facility] has been reviewed by the rehabilitation admission coordinator and physician.  Past Medical History  Past Medical History:  Diagnosis Date  . Hypertension   . Stroke [facility])     Family History   family history includes Cancer in his mother; Hypertension in his brother.  Prior Rehab/Hospitalizations Has the patient had prior rehab or hospitalizations prior to admission? Yes  Has the patient had major surgery during 100 days prior to admission? No   Current Medications  Current Facility-Administered  Medications:  .  0.9 %  sodium chloride infusion, , Intravenous, PRN, Bonnielee Haff, MD, Stopped at 04/09/19 1414 .  [DISCONTINUED] acetaminophen (TYLENOL) solution 650 mg, 650 mg, Per Tube, Q6H PRN, 650 mg at 03/30/19 1338 **OR** acetaminophen (TYLENOL) suppository 650 mg, 650 mg, Rectal, Q4H PRN, Wofford, Drew A, RPH .  amLODipine (NORVASC) tablet 5 mg, 5 mg, Per Tube, Daily, Ghimire, Henreitta Leber, MD, 5 mg at 04/11/19 1000 .  atorvastatin (LIPITOR) tablet 40 mg, 40 mg, Per Tube, q1800, Jonetta Osgood, MD, 40 mg at 04/10/19 1727 .  bisacodyl (DULCOLAX) suppository 10 mg, 10 mg, Rectal, Daily PRN, Collene Gobble, MD .  chlorhexidine gluconate (MEDLINE KIT) (PERIDEX) 0.12 % solution 15 mL, 15 mL, Mouth Rinse, BID, Sood, Vineet, MD, 15 mL at 04/11/19 1009 .  clopidogrel (PLAVIX) tablet 75 mg, 75 mg, Per Tube, Daily, Ghimire, Henreitta Leber, MD, 75 mg at 04/11/19 1008 .  enoxaparin (LOVENOX) injection 40 mg, 40 mg, Subcutaneous, Q24H, Sood, Vineet, MD, 40 mg at 04/10/19 1947 .  haloperidol lactate (HALDOL) injection 1-2 mg, 1-2 mg, Intravenous, Q6H PRN, Cherene Altes, MD .  hydrALAZINE (APRESOLINE) injection 10 mg, 10 mg, Intravenous, Q2H PRN, Cherene Altes, MD, 10 mg at 04/05/19 9150 .  insulin aspart (novoLOG) injection 0-9 Units, 0-9 Units, Subcutaneous, TID WC, Bonnielee Haff, MD, 1 Units at 04/11/19 1010 .  lidocaine (LIDODERM) 5 % 1 patch, 1 patch, Transdermal, Q24H, Bonnielee Haff, MD, 1 patch at 04/10/19 1728 .  MEDLINE mouth rinse, 15 mL, Mouth Rinse, BID, Bonnielee Haff, MD, 15 mL at 04/11/19 1009 .  metoprolol tartrate (LOPRESSOR) tablet 25 mg, 25 mg, Oral, BID, Bonnielee Haff, MD, 25 mg at 04/11/19 1009 .  morphine 2 MG/ML injection 1 mg, 1 mg, Intravenous, Q4H PRN, Jonetta Osgood, MD, 1 mg at 04/11/19 1006 .  [DISCONTINUED] ondansetron (ZOFRAN) tablet 4 mg, 4 mg, Oral, Q6H PRN **OR** ondansetron (ZOFRAN) injection 4 mg, 4 mg, Intravenous, Q6H PRN, Sood, Vineet, MD .   potassium chloride 20 MEQ/15ML (10%) solution 20 mEq, 20 mEq, Oral, Daily, Bonnielee Haff, MD, 20 mEq at 04/11/19 1009 .  QUEtiapine (SEROQUEL) tablet 25 mg, 25 mg, Oral, QHS, Bonnielee Haff, MD, 25 mg at 04/10/19 2055 .  [employer], , Oral, PRN, Bonnielee Haff, MD .  sodium chloride flush (NS) 0.9 % injection 10-40 mL, 10-40 mL, Intracatheter, Q12H, Sood, Vineet,  MD, 20 mL at 04/11/19 1009 .  sodium chloride flush (NS) 0.9 % injection 10-40 mL, 10-40 mL, Intracatheter, PRN, Chesley Mires, MD, 10 mL at 03/27/19 0500  Patients Current Diet:  Diet Order            DIET DYS 2 Room service appropriate? No; Fluid consistency: Nectar Thick  Diet effective now              Precautions / Restrictions Precautions Precautions: Fall Precaution Comments: High risk for falls.has a Air cabin crew Restrictions Weight Bearing Restrictions: No   Has the patient had 2 or more falls or a fall with injury in the past year? No  Prior Activity Level Community (5-7x/wk): working full-time PTA setting up furniture; drove PTA. lived with 34 yo son in house  Prior Functional Level Self Care: Did the patient need help bathing, dressing, using the toilet or eating? Independent  Indoor Mobility: Did the patient need assistance with walking from room to room (with or without device)? Independent  Stairs: Did the patient need assistance with internal or external stairs (with or without device)? Independent  Functional Cognition: Did the patient need help planning regular tasks such as shopping or remembering to take medications? Needed some help  Home Assistive Devices / Equipment Home Assistive Devices/Equipment: CBG Meter, Eyeglasses Home Equipment: None  Prior Device Use: Indicate devices/aids used by the patient prior to current illness, exacerbation or injury? None of the above  Current Functional Level Cognition  Arousal/Alertness: Awake/alert Overall Cognitive Status:  Impaired/Different from baseline Difficult to assess due to: (Soft spoken) Current Attention Level: Sustained Orientation Level: Oriented to person, Oriented to place, Oriented to situation Following Commands: Follows one step commands consistently Safety/Judgement: Decreased awareness of safety, Decreased awareness of deficits General Comments: Continues to improve, however, attention limiting performance adn requires multiple redirectional cues in minimally distracting environemtn Attention: Sustained Sustained Attention: Impaired Sustained Attention Impairment: Verbal basic Memory: Impaired Memory Impairment: Storage deficit, Retrieval deficit, Decreased recall of new information Awareness: Impaired Awareness Impairment: Intellectual impairment    Extremity Assessment (includes Sensation/Coordination)  Upper Extremity Assessment: Generalized weakness RUE Deficits / Details: using R UE functionally; generalzied weakness RUE Coordination: decreased fine motor, decreased gross motor LUE Deficits / Details: L shoulder pain limiting functional use of LUE; grasp appeasr equal to R; + impingement sign; weak abduction LUE: Unable to fully assess due to pain LUE Coordination: decreased fine motor, decreased gross motor  Lower Extremity Assessment: Defer to PT evaluation RLE Deficits / Details: seems equally weak bil LEs, ROM intact, ankles at least 3/5, knee 3-/5 (seated LAQ) , hip flexion 2/5.   LLE Deficits / Details: seems equally weak bil LEs, ROM intact, ankles at least 3/5, knee 3-/5 (seated LAQ) , hip flexion 2/5.      ADLs  Overall ADL's : Needs assistance/impaired Eating/Feeding: Minimal assistance, Maximal assistance, Sitting Eating/Feeding Details (indicate cue type and reason): Performed self feeding with SLP present for swallowing.  Grooming: Minimal assistance, Standing Grooming Details (indicate cue type and reason): standing at sink to brush teeth; cues to turn off water;  sequenced task without cues Upper Body Bathing: Maximal assistance, Sitting Upper Body Bathing Details (indicate cue type and reason): Pt requiring Max A for sitting balance at EOB during bathing. Pt requiring Max A to complete bathing task due to decreased processing and balance.  Lower Body Bathing: Moderate assistance, Sit to/from stand Lower Body Bathing Details (indicate cue type and reason): Pt standing with use of stedy and  maintaining standing for second person to complete LB bathing Upper Body Dressing : Total assistance, Sitting Lower Body Dressing: Minimal assistance, Sit to/from stand(for socks) Lower Body Dressing Details (indicate cue type and reason): Sitting EOB to donn socks; does better at bed level Toilet Transfer: Moderate assistance, RW(ambulating) Toilet Transfer Details (indicate cue type and reason): Ambulated to toilet; unsafely trying to sit before completly reaching toilet; feel fatigue factoring  in performance Toileting- Clothing Manipulation and Hygiene: Moderate assistance Toileting - Clothing Manipulation Details (indicate cue type and reason): incontinenet of urine at end of session; Max A to clean self Functional mobility during ADLs: Rolling walker, +2 for physical assistance, Minimal assistance General ADL Comments: Min-Mod A +2 for balance and safety abulate ~10 feet to sink and then sit in recliner once finished with grooming. Pt tolerating standing at sink for ~ 15 minutes. Pt highly motivated to particiapte in therapy. He also enjoyed listening to one of his favorite artists "Sage". At times, pt becoming distracted by music and requiring quiet environement to follow cues and participate in problem solving    Mobility  Overal bed mobility: Needs Assistance Bed Mobility: Supine to Sit Rolling: Supervision Sidelying to sit: Min assist Supine to sit: Min guard, HOB elevated Sit to supine: Mod assist Sit to sidelying: Min guard General bed mobility comments:  sitting on bed edge w/OT    Transfers  Overall transfer level: Needs assistance Equipment used: Rolling walker (2 wheeled) Transfer via Lift Equipment: Stedy Transfers: Sit to/from [employer] to Stand: Min assist, +2 safety/equipment Stand pivot transfers: Mod assist, +2 safety/equipment Squat pivot transfers: Max assist, +2 physical assistance General transfer comment: trunk forward but will stand erect with multimodal cues.; able to achieve upright posture frequent cues for posture. but unable to sustain do to poor attention    Ambulation / Gait / Stairs / Wheelchair Mobility  Ambulation/Gait Ambulation/Gait assistance: Mod assist, +2 safety/equipment Gait Distance (Feet): 20 Feet(x 2) Assistive device: Rolling walker (2 wheeled) Gait Pattern/deviations: Step-through pattern, Decreased step length - right, Decreased step length - left, Shuffle, Trunk flexed General Gait Details: multimodal cues for posture, improved advancement/swing  of the left leg/ patient requires cues to turn and back up fully to surface. All aspectss of mobility are sluggish. Gait velocity: decreased Gait velocity interpretation: <1.31 ft/sec, indicative of household ambulator    Posture / Balance Dynamic Sitting Balance Sitting balance - Comments: able to sit unsupoorted EOB Balance Overall balance assessment: Needs assistance Sitting-balance support: Feet supported, Bilateral upper extremity supported Sitting balance-Leahy Scale: Fair Sitting balance - Comments: able to sit unsupoorted EOB Postural control: Posterior lean Standing balance support: Bilateral upper extremity supported Standing balance-Leahy Scale: Poor Standing balance comment: reliant on external support    Special needs/care consideration BiPAP/CPAP: no CPM : no Continuous Drip IV: no Dialysis : no        Days : no Life Vest : no Oxygen : no, RA Special Bed : no Trach Size : no Wound Vac (area) : no      Location  no Skin: abrasion  to face, neck; serous blister on right heel and buttocks, excoriated location on medial back, MASD to bilateral buttocks, skin tear to right, posterior buttocks; stage 2 pressure injury to right cheek, deep tissue injury to left heel, deep tissue injury to bilateral buttocks, non-pressure wound cervical medial wide skin tear from ETT holder.  Bowel mgmt: Last BM: 04/10/19 incontinent  Bladder mgmt: external urinary catheter; incontinent Diabetic mgmt: yes Behavioral consideration : impulsive, safety sitter 1:1 Chemo/radiation : no   Previous Home Environment (from acute therapy documentation) Living Arrangements: Children Available Help at Discharge: Available 24 hours/day, Family Type of Home: House Home Layout: Two level, 1/2 bath on main level Alternate Level Stairs-Rails: Right Alternate Level Stairs-Number of Steps: flight Home Access: Stairs to enter Entrance Stairs-Rails: Right, Left Entrance Stairs-Number of Steps: 5 Bathroom Shower/Tub: Government social research officer Accessibility: Yes How Accessible: Accessible via walker Home Care Services: No Additional Comments: Plan to go to [facility] to stay with Jacqulynn Cadet, x-wife  Discharge Living Setting Plans for Discharge Living Setting: Other (Comment)(plan to go to [place] to live with ex-wife and her dtr) Type of Home at Discharge: House Discharge Home Layout: Two level, 1/2 bath on main level Alternate Level Stairs-Rails: Right Alternate Level Stairs-Number of Steps: 9 steps then landing with another 5 steps Discharge Home Access: Stairs to enter Entrance Stairs-Rails: Left Entrance Stairs-Number of Steps: one step with landing x3, then 3 steps up on porch Discharge Bathroom Shower/Tub: Tub/shower unit Discharge Bathroom Toilet: Standard Discharge Bathroom Accessibility: Yes How Accessible: Accessible via walker Does the patient have any problems obtaining your medications?:  No  Social/Family/Support Systems Patient Roles: Other (Comment)(has son, plans to live with ex-wife in [place]) Contact Information: ex-wife: [name] [phone]; son ([place]): [phone] Anticipated Caregiver: [name] and her duaghter  Anticipated Ambulance person Information: see above Ability/Limitations of Caregiver: Min G/Supervision (both [name] and her daughter have lifitng restrictions due to back issues and pregnancy) Caregiver Availability: 24/7 Discharge Plan Discussed with Primary Caregiver: Yes Is Caregiver In Agreement with Plan?: Yes Does Caregiver/Family have Issues with Lodging/Transportation while Pt is in Rehab?: No([name] plans to drive from [place] to pick pt up at [place])  Goals/Additional Needs Patient/Family Goal for Rehab: PT: Supervision, OT: Supervision/Min A; SLP: Supervision/Min A Expected length of stay: 16-20 days Cultural Considerations: NA Dietary Needs: DYS 2, nectar thick liquids; room service not appropriate  Equipment Needs: TBD Pt/Family Agrees to Admission and willing to participate: Yes Program Orientation Provided & Reviewed with Pt/Caregiver Including Roles  & Responsibilities: Yes(pt and his ex-wife)  Barriers to Discharge: Home environment access/layout, Lack of/limited family support, Other (comments)  Barriers to Discharge Comments: steps to enter; 2nd story living quarters, will need to be Min G/Supervision at [place] due to limited physical assist; pt will then need to tolerate car ride to [place] at [place]. Spoke with ex-wife about possibility of hourly breaks during 7+ hour car ride and having two people to assist with trip for safety. Ex-wife in agreement.  Decrease burden of Care through IP rehab admission: NA  Possible need for SNF placement upon discharge: Not anticipated; pt plans to return to ex-wife's house where she and her daughter can provide 24/7 A as needed  Patient Condition: I have reviewed medical records from [facility], spoken with RN, and patient and family member. I discussed via phone for inpatient rehabilitation assessment.  Patient will benefit from ongoing PT, OT and SLP, can actively participate in 3 hours of therapy a day 5 days of the week, and can make measurable gains during the admission.  Patient will also benefit from the coordinated team approach during an Inpatient Acute Rehabilitation admission.  The patient will receive intensive therapy as well as Rehabilitation physician, nursing, social worker, and care management interventions.  Due to bladder management, bowel  management, safety, skin/wound care, disease management, medication administration, pain management and patient education the patient requires 24 hour a day rehabilitation nursing.  The patient is currently Mod A x2 for 20 feet x2 bouts with mobility and Min/Mod A for basic ADLs.  Discharge setting and therapy post discharge at home with home health is anticipated.  Patient has agreed to participate in the Acute Inpatient Rehabilitation Program and will admit today.  Preadmission Screen Completed By:  Jhonnie Garner, 04/11/2019 11:03 AM ______________________________________________________________________   Discussed status with Dr. Naaman Plummer on 04/11/19 at 11:03AM and received approval for admission today.  Admission Coordinator:  Jhonnie Garner, OT, time 11:31AM/Date 04/11/19   Assessment/Plan: Diagnosis: debility related respiratory failure and multiple medical 1. Does the need for close, 24 hr/day Medical supervision in concert with the patient's rehab needs make it unreasonable for this patient to be served in a less intensive setting? Yes 2. Co-Morbidities requiring supervision/potential complications: COVID +, DM2, HTN 3. Due to bladder management, bowel management, safety, skin/wound care, disease management, medication administration, pain management and patient education, does the patient require 24 hr/day rehab nursing? Yes 4. Does  the patient require coordinated care of a physician, rehab nurse, PT (1-2 hrs/day, 5 days/week), OT (1-2 hrs/day, 5 days/week) and SLP (1-2 hrs/day, 5 days/week) to address physical and functional deficits in the context of the above medical diagnosis(es)? Yes Addressing deficits in the following areas: balance, endurance, locomotion, strength, transferring, bowel/bladder control, bathing, dressing, feeding, grooming, toileting, cognition, speech, swallowing and psychosocial support 5. Can the patient actively participate in an intensive therapy program of at least 3 hrs of therapy 5 days a week? Yes 6. The potential for patient to make measurable gains while on inpatient rehab is excellent 7. Anticipated functional outcomes upon discharge from inpatients are: supervision PT, supervision and min assist OT, supervision and min assist SLP 8. Estimated rehab length of stay to reach the above functional goals is: 15-20 days 9. Anticipated D/C setting: Home 10. Anticipated post D/C treatments: [place] therapy 11. Overall Rehab/Functional Prognosis: excellent  MD Signature: Meredith Staggers, MD, [facility] 04/11/2019

## 2019-04-05 NOTE — Progress Notes (Signed)
Physical Therapy Treatment Patient Details Name: Benjamin Cohen MRN: [id number] DOB: 1956-04-01 Today's Date: 04/05/2019    History of Present Illness 63 y.o. male admitted for acute respiratory failure (+) COVID 19, s/p intubation 4/2-4/11, re-intubated 4/17-4/20/20.  Pt with significant PMH of CVA and HTN.      PT Comments    The patient now has  NG tube, reaches up to nose at times when hands free  But  Did not make attempt to pull. Patient assisted to  Sitting  On the bed edge with max assist. Requires assist initially for balance, does  Improve some. Patient demonstrates decreased LUE and LLE strength compared to right. Assisted back into bed and placed in partial chair position.Continue PT.   Follow Up Recommendations  CIR     Equipment Recommendations  Rolling walker with 5" wheels;3in1 (PT);Wheelchair (measurements PT);Wheelchair cushion (measurements PT)    Recommendations for Other Services Rehab consult     Precautions / Restrictions Precautions Precautions: Fall Precaution Comments: NG tube feed, wrist restraints    Mobility  Bed Mobility Overal bed mobility: Needs Assistance Bed Mobility: Supine to Sit;Sit to Supine Rolling: Mod assist Sidelying to sit: Max assist;HOB elevated   Sit to supine: Max assist   General bed mobility comments: Assist to bring legs off of bed, elevate trunk into sitting and bring hips to EOB. Patient initiates moving the legs and uses LUE to attempt to push up into sitting. Required assist with legs and trunk to return to sidelying.  Transfers                 General transfer comment: NT today-  Ambulation/Gait                 Stairs             Wheelchair Mobility    Modified Rankin (Stroke Patients Only)       Balance Overall balance assessment: Needs assistance Sitting-balance support: Feet supported;Bilateral upper extremity supported Sitting balance-Leahy Scale: Poor Sitting balance - Comments:  listing to left today. Patient sat at intetrvals near midline, then drift to left and posteriorly.  Recliner placed in front of patient. Patient reaches up with right hand readily and holds in place. Patient has difficulty reaching up with LUE and does not maintain a grip, LUE dropped.                                    Cognition Arousal/Alertness: Lethargic Behavior During Therapy: Flat affect Overall Cognitive Status: Impaired/Different from baseline                     Current Attention Level: Alternating   Following Commands: Follows one step commands with increased time;Follows one step commands inconsistently   Awareness: Emergent Problem Solving: Slow processing;Decreased initiation;Difficulty sequencing;Requires verbal cues;Requires tactile cues General Comments: Lethargic and difficulty sustaining attention to task; Able to follow 1 step commands with increased time. Stayed aroused for sitting at bed edge, moves slowly      Exercises      General Comments        Pertinent Vitals/Pain Faces Pain Scale: No hurt    Home Living                      Prior Function            PT Goals (current goals can  now be found in the care plan section) Progress towards PT goals: Progressing toward goals    Frequency    Min 4X/week      PT Plan Current plan remains appropriate    Co-evaluation              AM-PAC PT "6 Clicks" Mobility   Outcome Measure  Help needed turning from your back to your side while in a flat bed without using bedrails?: Total Help needed moving from lying on your back to sitting on the side of a flat bed without using bedrails?: Total Help needed moving to and from a bed to a chair (including a wheelchair)?: Total Help needed standing up from a chair using your arms (e.g., wheelchair or bedside chair)?: Total Help needed to walk in hospital room?: Total Help needed climbing 3-5 steps with a railing? :  Total 6 Click Score: 6    End of Session   Activity Tolerance: Patient limited by fatigue;Patient limited by lethargy Patient left: in bed;with call bell/phone within reach;with bed alarm set Nurse Communication: Mobility status PT Visit Diagnosis: Unsteadiness on feet (R26.81)     Time: 1761-6073 PT Time Calculation (min) (ACUTE ONLY): 25 min  Charges:  $Therapeutic Activity: 23-37 mins                     Blanchard Kelch PT Acute Rehabilitation Services Pager [phone] Office [phone]    Rada Hay 04/05/2019, 3:16 PM

## 2019-04-05 NOTE — Progress Notes (Signed)
In to assist with reinsertion of NGT per MD order. Pt pulled tube out earlier in shift.

## 2019-04-05 NOTE — Progress Notes (Signed)
  Speech Language Pathology Treatment: Dysphagia  Patient Details Name: Benjamin Cohen MRN: [id number] DOB: [birth year] Today's Date: 04/05/2019 Time: 1500-1520 SLP Time Calculation (min) (ACUTE ONLY): 20 min  Assessment / Plan / Recommendation Clinical Impression  RN reported pt had NG, but he pulled it out. Pt noted to have wet vocal quality and thin pooled secretions in buccal cavity, frequent coughing at baseline. Pt does not appear to be initiating secretion management. Offered trials of ice with immediate cough response, no immediate swallow. When given trials of nectar thick liquids with a spoon there was ongoing need for verbal cueing to elicit a swallow, but no immediate cough. Again, pt able to consume 2 oz of puree with intermittent verbal cues to swallow, due to poorly sustained attention and delirium. No immediate signs of aspiration seen. After several minutes at rest, vocal quality again wet. Looking for improved awareness and automaticity with PO as trials continue. RN planning to replace NG today. If pt did not have a means of nutrition and meds had to be given orally, recommend meds crushed in puree.    HPI HPI: 63 y/o M admitted 3/31 COVID (+).Developed worsening hypoxemia, respiratory distress 4/1 and PCCM consulted for evaluation. Intubated 4/2 with ARDS.Was extubated 03/23/2019, with no oxygen requirement. Initial clinical swallow evaluation 4/13 revealed concerns for severe dysphagia; NPO was recommended, TF.  Pt developed respiratory distress and was reintubated on 03/28/2019; extubated 4/20. Hx prior strokes.       SLP Plan  Continue with current plan of care       Recommendations  Diet recommendations: NPO Medication Administration: Via alternative means                Follow up Recommendations: Inpatient Rehab SLP Visit Diagnosis: Dysphagia, unspecified (R13.10) Plan: Continue with current plan of care       GO               [name], MA  CCC-SLP  Acute Rehabilitation Services Pager [phone] Office [phone]  [name] 04/05/2019, 4:33 PM

## 2019-04-06 ENCOUNTER — Inpatient Hospital Stay (HOSPITAL_COMMUNITY): Payer: 59

## 2019-04-06 LAB — BASIC METABOLIC PANEL
Anion gap: 10 (ref 5–15)
BUN: 7 mg/dL — ABNORMAL LOW (ref 8–23)
CO2: 23 mmol/L (ref 22–32)
Calcium: 8.6 mg/dL — ABNORMAL LOW (ref 8.9–10.3)
Chloride: 106 mmol/L (ref 98–111)
Creatinine, Ser: 0.78 mg/dL (ref 0.61–1.24)
GFR calc Af Amer: >60 mL/min (ref >60)
GFR calc non Af Amer: >60 mL/min (ref >60)
Glucose, Bld: 196 mg/dL — ABNORMAL HIGH (ref 70–99)
Potassium: 3.7 mmol/L (ref 3.5–5.1)
Sodium: 139 mmol/L (ref 135–145)

## 2019-04-06 LAB — GLUCOSE, CAPILLARY
Glucose-Capillary: 119 mg/dL — ABNORMAL HIGH (ref 70–99)
Glucose-Capillary: 133 mg/dL — ABNORMAL HIGH (ref 70–99)
Glucose-Capillary: 149 mg/dL — ABNORMAL HIGH (ref 70–99)
Glucose-Capillary: 160 mg/dL — ABNORMAL HIGH (ref 70–99)
Glucose-Capillary: 170 mg/dL — ABNORMAL HIGH (ref 70–99)
Glucose-Capillary: 189 mg/dL — ABNORMAL HIGH (ref 70–99)

## 2019-04-06 NOTE — Progress Notes (Signed)
Pt's NG tube some how came out again this afternoon around 1800. Notified MD.  Dr. Jerral Ralph advised to leave out for this evening, and then to increase his fluids to 60 ml/hr. I modified orders per verbal order. Will re-evaluate in the AM, per MD on what to do about his tube feeding and access.Marland Kitchen

## 2019-04-06 NOTE — Progress Notes (Signed)
Patient pulled out NGT, feeding stopped. New NGT placed awaiting port CXR for placement check for placement

## 2019-04-06 NOTE — Progress Notes (Signed)
Occupational Therapy Evaluation Patient Details Name: Benjamin Cohen MRN: [id number] DOB: 09-Jan-1956 Today's Date: 04/06/2019    History of Present Illness 63 y.o. male admitted for acute respiratory failure (+) COVID 19, s/p intubation 4/2-4/11, re-intubated 4/17-4/20/20.  Pt with significant PMH of CVA and HTN.     Clinical Impression   Upon arrival, pt supine and awake. Pt participating in bath at EOB. Pt continues to present with decreased process and requires significant time throughout session. Pt requiring Max A for UB and LB bathing. Required Max A for sitting balance at EOB demonstrating decreased sitting balance compared to prior sessions. Pt requiring Mod A +2 to power up into standing with use of stedy for LB bathing. Continue to recommend dc to post-acute rehab and will continue to follow acutely as admitted.     Follow Up Recommendations  CIR;Supervision/Assistance - 24 hour    Equipment Recommendations  3 in 1 bedside commode    Recommendations for Other Services Rehab consult     Precautions / Restrictions Precautions Precautions: Fall Precaution Comments: NG tube feed, wrist restraints, mitts Restrictions Weight Bearing Restrictions: No      Mobility Bed Mobility Overal bed mobility: Needs Assistance Bed Mobility: Supine to Sit;Sit to Supine Rolling: Mod assist Sidelying to sit: Mod assist   Sit to supine: Mod assist   General bed mobility comments: Mod A to roll and reach with LUE to bed rail. Pt then able to pull himself to left and push into sitting. Pt requiring Mod A to bring LEs over EOB  Transfers Overall transfer level: Needs assistance   Transfers: Sit to/from Stand;Stand Pivot Transfers Sit to Stand: Mod assist;+2 physical assistance         General transfer comment: Mod A +2 to power up into standing wioth use of stedy    Balance Overall balance assessment: Needs assistance Sitting-balance support: Feet supported;Bilateral upper  extremity supported Sitting balance-Leahy Scale: Poor Sitting balance - Comments: Pt with posterior lean and poor control today.  Postural control: Posterior lean Standing balance support: Bilateral upper extremity supported Standing balance-Leahy Scale: Poor Standing balance comment: reliant on UE support on stedy and physical A                           ADL either performed or assessed with clinical judgement   ADL Overall ADL's : Needs assistance/impaired         Upper Body Bathing: Maximal assistance;Sitting Upper Body Bathing Details (indicate cue type and reason): Pt requiring Max A for sitting balance at EOB during bathing. Pt requiring Max A to complete bathing task due to decreased processing and balance.  Lower Body Bathing: Maximal assistance;Sit to/from stand;+2 for physical assistance Lower Body Bathing Details (indicate cue type and reason): Pt standing with use of stedy and maintaining standing for second person to complete LB bathing                     Functional mobility during ADLs: Maximal assistance;+2 for physical assistance;Cueing for safety;Cueing for sequencing(with use of Stedy) General ADL Comments: Pt continues to present with decreased cognition, strength, and balance     Vision         Perception     Praxis      Pertinent Vitals/Pain Pain Assessment: Faces Faces Pain Scale: Hurts little more Pain Location: left hand Pain Descriptors / Indicators: Discomfort;Grimacing(pointing to hand) Pain Intervention(s): Limited activity within patient's tolerance;Monitored during  session;Repositioned     Hand Dominance Right   Extremity/Trunk Assessment Upper Extremity Assessment Upper Extremity Assessment: RUE deficits/detail;Difficult to assess due to impaired cognition RUE Deficits / Details: using R UE functionally RUE Coordination: decreased fine motor;decreased gross motor LUE Deficits / Details: Appears weaker and more  incoordination than R; history of CVA; unsure of baseline use of LUE PTA; Unable to maintain grasp on Stedy LUE Coordination: decreased fine motor;decreased gross motor   Lower Extremity Assessment Lower Extremity Assessment: Defer to PT evaluation RLE Deficits / Details: seems equally weak bil LEs, ROM intact, ankles at least 3/5, knee 3-/5 (seated LAQ) , hip flexion 2/5.   LLE Deficits / Details: seems equally weak bil LEs, ROM intact, ankles at least 3/5, knee 3-/5 (seated LAQ) , hip flexion 2/5.     Cervical / Trunk Assessment Cervical / Trunk Assessment: Kyphotic;Other exceptions(forward head; L lateral lean) Cervical / Trunk Exceptions: flexed trunk EOB, difficulty holding head erect.    Communication Communication Communication: Expressive difficulties   Cognition Arousal/Alertness: Lethargic Behavior During Therapy: Flat affect Overall Cognitive Status: Impaired/Different from baseline Area of Impairment: Orientation;Attention;Memory;Following commands;Safety/judgement;Awareness;Problem solving                 Orientation Level: Disoriented to;Time Current Attention Level: Sustained Memory: Decreased recall of precautions;Decreased short-term memory Following Commands: Follows one step commands with increased time;Follows one step commands inconsistently Safety/Judgement: Decreased awareness of safety;Decreased awareness of deficits Awareness: Emergent Problem Solving: Slow processing;Decreased initiation;Difficulty sequencing;Requires verbal cues;Requires tactile cues General Comments: Pt continues to present with decreased processing and requiring significant time to follow commands. Pt closing his eyes for majority of session   General Comments  VSS throughout    Exercises     Shoulder Instructions      Home Living Family/patient expects to be discharged to:: Private residence Living Arrangements: Children Available Help at Discharge: Available 24  hours/day;Family Type of Home: House Home Access: Stairs to enter Entergy CorporationEntrance Stairs-Number of Steps: 5 Entrance Stairs-Rails: Right;Left Home Layout: Two level;1/2 bath on main level Alternate Level Stairs-Number of Steps: flight Alternate Level Stairs-Rails: Right Bathroom Shower/Tub: Chief Strategy OfficerTub/shower unit   Bathroom Toilet: Standard Bathroom Accessibility: Yes How Accessible: Accessible via walker Home Equipment: None   Additional Comments: [facility] [name], x-wife      Prior Functioning/Environment Level of Independence: Independent        Comments: Drives; 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); likes to fish; enjoys playing golf        OT Problem List: Decreased strength;Decreased range of motion;Decreased activity tolerance;Impaired balance (sitting and/or standing);Decreased coordination;Decreased cognition;Decreased safety awareness;Decreased knowledge of use of DME or AE;Cardiopulmonary status limiting activity;Impaired UE functional use;Pain      OT Treatment/Interventions: Self-care/ADL training;Therapeutic exercise;Neuromuscular education;DME and/or AE instruction;Energy conservation;Cognitive remediation/compensation;Therapeutic activities;Patient/family education;Balance training    OT Goals(Current goals can be found in the care plan section) Acute Rehab OT Goals Patient Stated Go[facility] OT Goal Formulation: With patient Time For Goal Achievement: 04/15/19 Potential to Achieve Goals: Good ADL Goals Pt Will Perform Grooming: with modified independence;sitting Pt Will Perform Upper Body Bathing: with set-up;with supervision;sitting Pt Will Perform Lower Body Bathing: with min assist;sit to/from stand Pt Will Transfer to Toilet: bedside commode;with min assist;stand pivot transfer Additional ADL Goal #1: pt will perform UB adls with mod A seated Additional ADL Goal #2: pt will go from sit to stand  with mod +  2 assist for adls Additional ADL Goal #3: Pt will tolerate A/AAROM to bil UEs to strengthening arms for adls and toilet transfers  OT Frequency: Min 3X/week   Barriers to D/C:            Co-evaluation              AM-PAC OT "6 Clicks" Daily Activity     Outcome Measure Help from another person eating meals?: Total Help from another person taking care of personal grooming?: A Lot Help from another person toileting, which includes using toliet, bedpan, or urinal?: Total Help from another person bathing (including washing, rinsing, drying)?: A Lot Help from another person to put on and taking off regular upper body clothing?: Total Help from another person to put on and taking off regular lower body clothing?: Total 6 Click Score: 8   End of Session Equipment Utilized During Treatment: Other (comment);Gait belt(Stedy) Nurse Communication: Mobility status  Activity Tolerance: Patient limited by lethargy;Patient tolerated treatment well Patient left: in bed;with call bell/phone within reach;with bed alarm set;Other (comment);with nursing/sitter in room(Baby alarm set up)  OT Visit Diagnosis: Other abnormalities of gait and mobility (R26.89);Muscle weakness (generalized) (M62.81);Pain;Other symptoms and signs involving cognitive function Pain - part of body: (back)                Time: 1646-1610-9604T Time Calculation (min): 33 min Charges:  OT General Charges $OT Visit: 1 Visit OT Treatments $Self Care/Home Management : 23-37 mins  Lakyn Alsteen MSOT, OTR/L Acute Rehab Pager: [phone] Office: [phone]  Theodoro Grist Idelle Reimann 04/06/2019, 6:34 PM

## 2019-04-06 NOTE — Progress Notes (Signed)
[facility] TEAM 1 - Stepdown/ICU TEAM  Benjamin Cohen  AFB:[id number] DOB: 1956/07/16 DOA: 03/11/2019 PCP: Maude Leriche, PA-C    Brief Narrative:  63 y/o M admitted 3/31 with fever, cough, SOB and confusion. He attended a wedding mid March, after which multiple family members were diagnosed with COVID including his wife. He developed worsening hypoxemia w/ respiratory distress 4/1 and PCCM was consulted for evaluation. Intubated 4/2 with ARDS. Extubated 4/12. Had to be reintubated 4/17.   Significant Events: 3/31 Admit, 2L, bilateral patchy infiltrates  4/01 PCCM consulted 4/02 Intubated 4/03 toclizamab x 1 4/05 Net positive, remains on paralytic, still prone 4/06 Supine, plateau pressure 23, 50% / 12 PEEP; complete 5 days of plaquenil 4/08 Plateau 23, PEEP 12 / 40% 4/14 transfer to floor bed 4/17 re-intubated   4/20 extubated 4/22 SLP eval > NPO 4/24 NG tube reinserted and NG feedings started  COVID-19 specific Treatment: Actemra x1 Plaquenil 4/1 > 4/6   Ventilator Settings: NA - extubated 4/20  Subjective: Has pulled out NG tube at least 2-3 times since being reinserted on 4/24.  He is awake-able to tell me his name and his wife's name.  Moves all 4 extremities.  Evaluated by speech therapy yesterday-remains n.p.o.    Assessment & Plan: Acute Hypoxic Resp Failure - Covid 19 associated ARDS - MRSA Pneumonia: Remains relatively stable-remains on room air-has completed a course of empiric IV vancomycin.  Continue to provide supportive care.   Acute metabolic encephalopathy - ICU delirium: Slowly continues to improve-he is awake-able to answer most of my questions appropriately.  Continue to minimize narcotics/Haldol and other sedative medications as much as possible.  Continue supportive care-hopefully he will continue to improve over the next few days.    Dysphagia, post extubation: Unfortunately when the patient was in the ICU-he repeatedly pulled out his NG tube-this was  reinserted on 4/24-even after reinsertion (with soft restraints) he continues to pull out his NG tube.  His mental status seems to be slowly improving-SLP following-we will await further recommendations.  If he continues to have persistent dysphagia-he may need a PEG tube at some point.    HTN: BP better controlled-continue amlodipine.  History of CVA: Difficult exam but seems to be moving all 4 extremities-continue Plavix and statin  Hypokalemia: Repleted  DM2: CBGs stable-continue with SSI.  Normocytic anemia: Secondary to acute illness-hemoglobin stable-no evidence of blood loss.  Follow periodically.  Nutrition: Started on NG feedings on 4/24  DVT prophylaxis: lovenox  Code Status: FULL CODE Family Communication: Left message for spouse Disposition Plan: Remain inpatient-CIR in the next few days  Consultants:  PCCM  Antimicrobials:  Vanc 4/17 > 4/23  Objective: Blood pressure (!) 142/108, pulse 99, temperature 98.7 F (37.1 C), temperature source Oral, resp. rate (!) 21, height '5\' 7"'$  (1.702 m), weight 103 kg, SpO2 93 %.  Intake/Output Summary (Last 24 hours) at 04/06/2019 1031 Last data filed at 04/06/2019 0912 Gross per 24 hour  Intake 10 ml  Output 1475 ml  Net -1465 ml   Filed Weights   04/02/19 0500 04/04/19 0617 04/05/19 0500  Weight: 103.2 kg 103.3 kg 103 kg    Examination: General appearance:Awake, not in any distress.  Follows commands. Eyes:no scleral icterus. HEENT: Atraumatic and Normocephalic Neck: supple, no JVD. Resp:Good air entry bilaterally, scattered rhonchi heard anteriorly CVS: S1 S2 regular, no murmurs.  GI: Bowel sounds present, Non tender and not distended with no gaurding, rigidity or rebound. Extremities: B/L Lower Ext shows no  edema, both legs are warm to touch Neurology: Was all 4 extremities-difficult exam as in restraints. Musculoskeletal:No digital cyanosis Skin:No Rash, warm and dry Wounds:N/A  CBC: Recent Labs  Lab  04/02/19 0300 04/03/19 0335 04/03/19 0435 04/05/19 0400  WBC 4.9 5.6  --  6.7  HGB 9.7* 10.8* 9.9* 10.1*  HCT 31.5* 34.8* 29.0* 33.4*  MCV 88.0 86.4  --  86.8  PLT 184 242  --  468   Basic Metabolic Panel: Recent Labs  Lab 04/01/19 0304  04/03/19 0335  04/04/19 0430 04/05/19 0400 04/06/19 0427  NA 149*   < > 141   < > 141 140 139  K 3.6   < > 3.1*   < > 3.5 3.4* 3.7  CL 116*   < > 108  --  109 108 106  CO2 26   < > 22  --  _0 GLUCOSE 125*   < > 164*  --  156* 178* 196*  BUN 21   < > 12  --  8 7* 7*  CREATININE 0.79   < > 0.77  --  0.87 0.84 0.78  CALCIUM 8.2*   < > 8.1*  --  8.5* 8.4* 8.6*  MG 2.5*  --  2.1  --   --   --   --    < > = values in this interval not displayed.   GFR: Estimated Creatinine Clearance: 105.3 mL/min (by C-G formula based on SCr of 0.78 mg/dL).  Liver Function Tests: Recent Labs  Lab 04/01/19 0304 04/03/19 0335  AST 26 24  ALT 29 28  ALKPHOS 101 93  BILITOT 0.7 0.9  PROT 6.8 6.9  ALBUMIN 2.6* 2.7*   HbA1C: Hgb A1c MFr Bld  Date/Time Value Ref Range Status  03/11/2019 03:38 PM 12.4 (H) 4.8 - 5.6 % Final    Comment:    (NOTE) Pre diabetes:          5.7%-6.4% Diabetes:              >6.4% Glycemic control for   <7.0% adults with diabetes   04/30/2018 01:28 AM 11.5 (H) 4.8 - 5.6 % Final    Comment:    (NOTE) Pre diabetes:          5.7%-6.4% Diabetes:              >6.4% Glycemic control for   <7.0% adults with diabetes     CBG: Recent Labs  Lab 04/05/19 1615 04/05/19 1944 04/05/19 2325 04/06/19 0428 04/06/19 0856  GLUCAP 159* 130* 170* 189* 149*    Recent Results (from the past 240 hour(s))  Culture, respiratory (non-expectorated)     Status: None   Collection Time: 03/28/19  3:58 PM  Result Value Ref Range Status   Specimen Description   Final    TRACHEAL ASPIRATE Performed at [facility], [place] [address], [place], [place] [address]    Special Requests   Final    NONE Performed at  [facility], [place] [address], [place], [place] [address]    Gram Stain   Final    ABUNDANT WBC PRESENT,BOTH PMN AND MONONUCLEAR ABUNDANT GRAM POSITIVE COCCI IN PAIRS Performed at [facility], [place] [address], [place], [place] [address]    Culture   Final    MODERATE METHICILLIN RESISTANT STAPHYLOCOCCUS AUREUS   Report Status 03/30/2019 FINAL  Final   Organism ID, Bacteria METHICILLIN RESISTANT STAPHYLOCOCCUS AUREUS  Final  Susceptibility   Methicillin resistant staphylococcus aureus - MIC*    CIPROFLOXACIN >=8 RESISTANT Resistant     ERYTHROMYCIN >=8 RESISTANT Resistant     GENTAMICIN <=0.5 SENSITIVE Sensitive     OXACILLIN >=4 RESISTANT Resistant     TETRACYCLINE <=1 SENSITIVE Sensitive     VANCOMYCIN 1 SENSITIVE Sensitive     TRIMETH/SULFA <=10 SENSITIVE Sensitive     CLINDAMYCIN <=0.25 SENSITIVE Sensitive     RIFAMPIN <=0.5 SENSITIVE Sensitive     Inducible Clindamycin NEGATIVE Sensitive     * MODERATE METHICILLIN RESISTANT STAPHYLOCOCCUS AUREUS     Scheduled Meds:  amLODipine  5 mg Per Tube Daily   atorvastatin  40 mg Per Tube q1800   chlorhexidine gluconate (MEDLINE KIT)  15 mL Mouth Rinse BID   clopidogrel  75 mg Per Tube Daily   enoxaparin (LOVENOX) injection  40 mg Subcutaneous Q24H   insulin aspart  0-15 Units Subcutaneous Q4H   mouth rinse  15 mL Mouth Rinse BID   sodium chloride flush  10-40 mL Intracatheter Q12H   Continuous Infusions:  sodium chloride 10 mL/hr at 04/02/19 1200   dextrose 5 % and 0.45 % NaCl with KCl 20 mEq/L 50 mL/hr at 04/06/19 1021   feeding supplement (OSMOLITE 1.5 CAL) 30 mL/hr at 04/05/19 1147     LOS: 26 days   Cherene Altes, MD [facility] Office  [phone] Pager - Text Page per Shea Stoutenburg  If 7PM-7AM, please contact night-coverage per Amion 04/06/2019, 10:31 AM

## 2019-04-07 LAB — GLUCOSE, CAPILLARY
Glucose-Capillary: 165 mg/dL — ABNORMAL HIGH (ref 70–99)
Glucose-Capillary: 164 mg/dL — ABNORMAL HIGH (ref 70–99)
Glucose-Capillary: 177 mg/dL — ABNORMAL HIGH (ref 70–99)
Glucose-Capillary: 157 mg/dL — ABNORMAL HIGH (ref 70–99)
Glucose-Capillary: 165 mg/dL — ABNORMAL HIGH (ref 70–99)
Glucose-Capillary: 108 mg/dL — ABNORMAL HIGH (ref 70–99)
Glucose-Capillary: 122 mg/dL — ABNORMAL HIGH (ref 70–99)

## 2019-04-07 LAB — BASIC METABOLIC PANEL
Anion gap: 9 (ref 5–15)
BUN: 6 mg/dL — ABNORMAL LOW (ref 8–23)
CO2: 24 mmol/L (ref 22–32)
Calcium: 8.5 mg/dL — ABNORMAL LOW (ref 8.9–10.3)
Chloride: 105 mmol/L (ref 98–111)
Creatinine, Ser: 0.8 mg/dL (ref 0.61–1.24)
GFR calc Af Amer: >60 mL/min (ref >60)
GFR calc non Af Amer: >60 mL/min (ref >60)
Glucose, Bld: 274 mg/dL — ABNORMAL HIGH (ref 70–99)
Potassium: 4.1 mmol/L (ref 3.5–5.1)
Sodium: 138 mmol/L (ref 135–145)

## 2019-04-07 NOTE — Progress Notes (Signed)
Inpatient Rehabilitation-Admissions Coordinator   In order for this pt to be admitted to [facility] from [facility], he will need to be fever free for at least 3 days without use of any anti-pyretics (noted 100.58F on 4/27) . Pt will also need to have 2 negative COVID tests.   AC will continue to follow along for possible admission once the above criteria have been met.   Jhonnie Garner, OTR/L  Rehab Admissions Coordinator  [phone] 04/07/2019 6:29 PM

## 2019-04-07 NOTE — Progress Notes (Signed)
Speech Language Pathology Dysphagia/cognitive Treatment Patient Details Name: Benjamin Cohen MRN: [id number] DOB: [birth year] Today's Date: 04/07/2019 Time: 1330-1430 SLP Time Calculation (min) (ACUTE ONLY): 60 min  Assessment / Plan / Recommendation Clinical Impression   Pt has pulled another NG tube. F/u focused on dysphagia/cognition. Co-treated with OT due to complexity of issues.  Pt sat EOB; completed his own oral care with verbal/physical assist and prompts to attend to oral spillage. Voice remains hypophonic/breathy, with cues needed nearly 100% of time to increase volume.  Pt with poor spontaneity; insight significantly impaired.  Initially, with limited to no verbal responses to clinicians.  As session progressed, pt became more interactive with faster response time and improved affect/emotional response.  Attention is best with singular focus, with notable breakdown when asked to selectively attend to dual tasks. He required consistent verbal cues to attend to sitting EOB and focus on swallowing.  Phone call was made to pt's ex-wife, Bronson Ing, and daughter, who provided him with encouragement.  Pt was only minimally more engaged when speaking with them on the phone.  With regard to swallow, he continues to demonstrate ongoing clinical s/s and symptoms concerning for impaired airway protection, including intermittent cough/wet phonation, difficulty with secretion management.  With cues to swallow hard, cough, and re-swallow, he consumed limited teaspoons of pudding without overt deficits; however, concern is for silent aspiration in his particular circumstance.  Recommend for today, continue to allow ice chips; allow crushed oral meds in pudding/applesauce with cues to swallow hard then cough.  Otherwise continue NPO. There is potential for MBS capability in this facility, potentially as early as tomorrow. Have reached out to radiology services for timeline. Spoke with RN re: plan.  SLP will  continue to follow for swallow/cognition.      Diet Recommendation    NPO except ice chips; meds crushed in puree.    SLP Plan MBS;Continue with current plan of care   Pertinent Vitals/Pain Sp02 97% on RA   Swallowing Goals     General Behavior/Cognition: Cooperative Patient Positioning: Other (comment)(EOB with OT) Oral care provided: Yes HPI: 63 y/o M admitted 3/31 COVID (+).Developed worsening hypoxemia, respiratory distress 4/1 and PCCM consulted for evaluation. Intubated 4/2 with ARDS.Was extubated 03/23/2019, with no oxygen requirement. Initial clinical swallow evaluation 4/13 revealed concerns for severe dysphagia; NPO was recommended, TF.  Pt developed respiratory distress and was reintubated on 03/28/2019; extubated 4/20. Hx prior strokes.   Oral Cavity - Oral Hygiene     Dysphagia Treatment Family/Caregiver Educated: (ex-wife, Benjamin Cohen by phone) Treatment Methods: Skilled observation;Upgraded PO texture trial Patient observed directly with PO's: Yes Type of PO's observed: Thin liquids;Dysphagia 1 (puree);Nectar-thick liquids;Ice chips Feeding: Needs assist Liquids provided via: Cup;Teaspoon Oral Phase Signs & Symptoms: Prolonged bolus formation;Anterior loss/spillage Pharyngeal Phase Signs & Symptoms: Wet vocal quality;Delayed throat clear;Delayed cough Type of cueing: Verbal;Tactile;Visual Amount of cueing: Moderate   GO     [name] [name] 04/07/2019, 2:50 PM    Kiant[place] Petrovich L. [name], MA CCC/SLP Acute Rehabilitation Services Office number [phone] Cell phone at [facility] [phone]

## 2019-04-07 NOTE — Progress Notes (Signed)
PT Cancellation Note  Patient Details Name: Benjamin Cohen MRN: [id number] DOB: 11-Feb-1956   Cancelled Treatment:    Reason Eval/Treat Not Completed: Other (comment)/ participated w/ ot AND sp FOR EXTENSIVE TIME, SITTING ON BED EDGE. PATIENT FATIGUED. WILL CHECK BACK TOMORROW.   Rada Hay 04/07/2019, 4:43 PM Blanchard Kelch PT Acute Rehabilitation Services Pager [phone] Office [phone]

## 2019-04-07 NOTE — Progress Notes (Signed)
Inpatient Rehabilitation-Admissions Coordinator   [facility] will continue to follow for medical readiness.   Please call if questions.   Nanine Means, OTR/L  Rehab Admissions Coordinator  [phone] 04/07/2019 2:06 PM

## 2019-04-07 NOTE — Progress Notes (Addendum)
Occupational Therapy Treatment Patient Details Name: Benjamin Cohen MRN: [id number] DOB: 14-May-1956 Today's Date: 04/07/2019    History of present illness 63 y.o. male admitted for acute respiratory failure (+) COVID 19, s/p intubation 4/2-4/11, re-intubated 4/17-4/20/20.  Pt with significant PMH of CVA and HTN.     OT comments  Pt progressing towards established OT goals and continues to present with high motivated to participate in therapy despite fatigue. Focused session on sitting balance/tolerance and cognition. Pt tolerated sitting at EOB for ~40-50 minutes. Pt fluctuating between requiring Min Guard-Max A for sitting balance depending on fatigue and dual-tasks[facility] pt in calling his family (ex-wife and daughter) to check in and pt very appreciative. Pt continues to present with decreased attention and awareness. Continue to recommend dc to CIR and will continue to follow acutely as admitted.    Follow Up Recommendations  CIR;Supervision/Assistance - 24 hour    Equipment Recommendations  3 in 1 bedside commode    Recommendations for Other Services Rehab consult    Precautions / Restrictions Precautions Precautions: Fall Restrictions Weight Bearing Restrictions: No       Mobility Bed Mobility Overal bed mobility: Needs Assistance Bed Mobility: Rolling;Sidelying to Sit;Sit to Sidelying Rolling: Min assist Sidelying to sit: Mod assist     Sit to sidelying: Mod assist General bed mobility comments: Min A to reach towards bed rail. Pt pushing into sidelying and then required Mod A to power up into seated position. Pt requiring ModA to manage BLEs over EOB in returning to bed.   Transfers Overall transfer level: Needs assistance Equipment used: 1 person hand held assist Transfers: Sit to/from Stand Sit to Stand: Mod assist;+2 safety/equipment         General transfer comment: Mod A to power up into standing; blocked knees. Pt unabel to achieve full  upright posture. taking side steps [facility]    Balance Overall balance assessment: Needs assistance Sitting-balance support: Feet supported;Bilateral upper extremity supported Sitting balance-Leahy Scale: Poor Sitting balance - Comments: Pt with posterior lean and poor control today.  Postural control: Posterior lean Standing balance support: Bilateral upper extremity supported Standing balance-Leahy Scale: Poor Standing balance comment: Reliant on physical A                           ADL either performed or assessed with clinical judgement   ADL Overall ADL's : Needs assistance/impaired Eating/Feeding: Minimal assistance;Maximal assistance;Sitting Eating/Feeding Details (indicate cue type and reason): Performed self feeding with SLP present for swallowing.  Grooming: Oral care;Minimal assistance;Maximal assistance;Sitting Grooming Details (indicate cue type and reason): Pt requiring flucuating support  EOB during oral care. When sitting and resting, pt maintaining sitting with Min Guard A for ~ 5 second before leaning posteriorly and right. During oral care, pt requiring Mod-max A for support presenting with decreased attention and high fatigue             Lower Body Dressing: Total assistance;Sit to/from stand Lower Body Dressing Details (indicate cue type and reason): donned socks in bed             Functional mobility during ADLs: Cueing for safety;Cueing for sequencing;Moderate assistance;+2 for safety/equipment(side steps to [facility]) General ADL Comments: Focused session on sitting balance and cognition. Pt performing oral care and eating at EOB while maintaining sitting posture. During tasks, pt presents with decreased sitting balance and requires increased cues and support. While sitting at EOB, assisted pt  in calling his family to check in. Pt tearful during this time.      Vision       Perception     Praxis      Cognition Arousal/Alertness:  Awake/alert Behavior During Therapy: Flat affect Overall Cognitive Status: Impaired/Different from baseline Area of Impairment: Orientation;Attention;Memory;Following commands;Safety/judgement;Awareness;Problem solving                 Orientation Level: Disoriented to;Time(Able to state it is April, but stating it is early April) Current Attention Level: Sustained Memory: Decreased recall of precautions;Decreased short-term memory Following Commands: Follows one step commands with increased time;Follows one step commands inconsistently Safety/Judgement: Decreased awareness of safety;Decreased awareness of deficits Awareness: Emergent Problem Solving: Slow processing;Decreased initiation;Difficulty sequencing;Requires verbal cues;Requires tactile cues General Comments: Focused session on sitting balance during ADL. Pt presenting with cognitive fatigue and difficulty maintaining sitting balance during ADL task. Pt slow to answer questions requiring increased time. During session, called pt's family and he became tearful.         Exercises     Shoulder Instructions       General Comments VSS throughout. SpO2 >96% on RA. HR 101.     Pertinent Vitals/ Pain       Pain Assessment: Faces Faces Pain Scale: No hurt Pain Intervention(s): Monitored during session;Limited activity within patient's tolerance;Repositioned  Home Living                                          Prior Functioning/Environment              Frequency  Min 3X/week        Progress Toward Goals  OT Goals(current goals can now be found in the care plan section)  Progress towards OT goals: Progressing toward goals  Acute Rehab OT Goals Patient Stated Goal: to go to [facility] with his x-wife OT Goal Formulation: With patient Time For Goal Achievement: 04/15/19 Potential to Achieve Goals: Good ADL Goals Pt Will Perform Grooming: with modified independence;sitting Pt Will Perform Upper  Body Bathing: with set-up;with supervision;sitting Pt Will Perform Lower Body Bathing: with min assist;sit to/from stand Pt Will Transfer to Toilet: bedside commode;with min assist;stand pivot transfer Additional ADL Goal #1: pt will perform UB adls with mod A seated Additional ADL Goal #2: pt will go from sit to stand with mod +2 assist for adls Additional ADL Goal #3: Pt will tolerate A/AAROM to bil UEs to strengthening arms for adls and toilet transfers  Plan Discharge plan remains appropriate    Co-evaluation    PT/OT/SLP Co-Evaluation/Treatment: Yes Reason for Co-Treatment: Complexity of the patient's impairments (multi-system involvement);For patient/therapist safety;To address functional/ADL transfers;Other (comment)(With SLP to focus on cognition)   OT goals addressed during session: ADL's and self-care      AM-PAC OT "6 Clicks" Daily Activity     Outcome Measure   Help from another person eating meals?: Total Help from another person taking care of personal grooming?: A Lot Help from another person toileting, which includes using toliet, bedpan, or urinal?: Total Help from another person bathing (including washing, rinsing, drying)?: A Lot Help from another person to put on and taking off regular upper body clothing?: Total Help from another person to put on and taking off regular lower body clothing?: Total 6 Click Score: 8    End of Session    OT Visit Diagnosis: Other  abnormalities of gait and mobility (R26.89);Muscle weakness (generalized) (M62.81);Pain;Other symptoms and signs involving cognitive function Pain - part of body: (back)   Activity Tolerance Patient tolerated treatment well   Patient Left in bed;with call bell/phone within reach;with bed alarm set;Other (comment);with nursing/sitter in room(Baby alarm set up)   Nurse Communication Mobility status        Time: 1329-1430 OT Time Calculation (min): 61 min  Charges: OT General Charges $OT Visit: 1  Visit OT Treatments $Self Care/Home Management : 23-37 mins  Josedejesus Marcum MSOT, OTR/L Acute Rehab Pager: [phone] Office: [phone]   Theodoro Grist Namiah Dunnavant 04/07/2019, 5:01 PM

## 2019-04-07 NOTE — Progress Notes (Signed)
[place] TEAM 1 - Stepdown/ICU TEAM  Benjamin Cohen  YYQ:[id number] DOB: [birth year] DOA: 03/11/2019 PCP: Maude Leriche, PA-C    Brief Narrative:  63 y/o M admitted 3/31 with fever, cough, SOB and confusion. He attended a wedding mid March, after which multiple family members were diagnosed with COVID including his wife. He developed worsening hypoxemia w/ respiratory distress 4/1 and PCCM was consulted for evaluation. Intubated 4/2 with ARDS. Extubated 4/12. Had to be reintubated 4/17.  Upon further stability-he was transferred out of the ICU- post extubation course complicated by severe delirium, severe dysphagia requiring NG tube.  Unfortunately, patient has pulled out his NG tube repeatedly-speech therapy following closely to see if diet can be reinitiated-remains n.p.o.  Significant Events: 3/31 Admit, 2L, bilateral patchy infiltrates  4/01 PCCM consulted 4/02 Intubated 4/03 toclizamab x 1 4/05 Net positive, remains on paralytic, still prone 4/06 Supine, plateau pressure 23, 50% / 12 PEEP; complete 5 days of plaquenil 4/08 Plateau 23, PEEP 12 / 40% 4/14 transfer to floor bed 4/17 re-intubated   4/20 extubated 4/22 SLP eval > NPO 4/24 NG tube reinserted and NG feedings started 4/25 to 4/27>> repeatedly has pulled out NG tube-requiring reinsertion  COVID-19 specific Treatment: Actemra x1 Plaquenil 4/1 > 4/6   Ventilator Settings: NA - extubated 4/20  Subjective: Awake-not in any distress-pulled out NG tube yesterday evening.  Follows commands-but still appears somewhat confused.  Assessment & Plan: Acute Hypoxic Resp Failure - Covid 19 associated ARDS - MRSA Pneumonia: Remains relatively stable-remains on room air-has completed a course of empiric IV vancomycin.  Continue to provide supportive care.   Acute metabolic encephalopathy - ICU delirium: Slowly continues to improve-is awake-answers most of my questions appropriately-follows most of my commands as well.  Continue  to minimize narcotics/Haldol another sedative medications as much as possible.  Dysphagia- post extubation: Probably secondary to prolonged intubation, he also has a history of prior CVA.  As noted above-has pulled out NG tube repeatedly-spoke with nursing staff-cortak team does not come to [facility] hospital-we will await repeat speech therapy evaluation today, if he continues to exhibit severe dysphagia-NG tube will need to be reinserted again.  If he continues to have persistent dysphagia over the next few days-May need to consider placing a PEG tube at some point.   HTN: BP better controlled-continue amlodipine.  History of CVA: Difficult exam but seems to be moving all 4 extremities-continue Plavix and statin  Hypokalemia: Repleted  DM2: CBGs stable-continue with SSI.  Normocytic anemia: Secondary to acute illness-hemoglobin stable-no evidence of blood loss.  Follow periodically.  Nutrition: Likely need to be restarted on NG feeding-see above.  Deconditioning: Secondary to acute/critical illness-CIR evaluation in progress  DVT prophylaxis: lovenox  Code Status: FULL CODE Family Communication: Spoke over the phone on 4/27 Disposition Plan: Remain inpatient-CIR in the next few days  Consultants:  PCCM  Antimicrobials:  Vanc 4/17 > 4/23  Objective: Blood pressure (!) 142/108, pulse 99, temperature 100.1 F (37.8 C), temperature source Oral, resp. rate (!) 21, height _0  (1.702 m), weight 101.7 kg, SpO2 93 %.  Intake/Output Summary (Last 24 hours) at 04/07/2019 0906 Last data filed at 04/07/2019 0500 Gross per 24 hour  Intake 1236.33 ml  Output 725 ml  Net 511.33 ml   Filed Weights   04/04/19 0617 04/05/19 0500 04/07/19 0500  Weight: 103.3 kg 103 kg 101.7 kg    Examination: General appearance:Awake, somewhat confused.  Not in any distress. Eyes:no scleral icterus. HEENT: Atraumatic and  Normocephalic Neck: supple, no JVD. Resp:Good air entry bilaterally,no rales or  rhonchi CVS: S1 S2 regular, no murmurs.  GI: Bowel sounds present, Non tender and not distended with no gaurding, rigidity or rebound. Extremities: B/L Lower Ext shows no edema, both legs are warm to touch Neurology: Good exam but moves all 4 extremities Musculoskeletal:No digital cyanosis Skin:No Rash, warm and dry Wounds:N/A  CBC: Recent Labs  Lab 04/02/19 0300 04/03/19 0335 04/03/19 0435 04/05/19 0400  WBC 4.9 5.6  --  6.7  HGB 9.7* 10.8* 9.9* 10.1*  HCT 31.5* 34.8* 29.0* 33.4*  MCV 88.0 86.4  --  86.8  PLT 184 242  --  009   Basic Metabolic Panel: Recent Labs  Lab 04/01/19 0304  04/03/19 0335  04/05/19 0400 04/06/19 0427 04/07/19 0540  NA 149*   < > 141   < > 140 139 138  K 3.6   < > 3.1*   < > 3.4* 3.7 4.1  CL 116*   < > 108   < > 108 106 105  CO2 26   < > 22   < > _0 GLUCOSE 125*   < > 164*   < > 178* 196* 274*  BUN 21   < > 12   < > 7* 7* 6*  CREATININE 0.79   < > 0.77   < > 0.84 0.78 0.80  CALCIUM 8.2*   < > 8.1*   < > 8.4* 8.6* 8.5*  MG 2.5*  --  2.1  --   --   --   --    < > = values in this interval not displayed.   GFR: Estimated Creatinine Clearance: 104.6 mL/min (by C-G formula based on SCr of 0.8 mg/dL).  Liver Function Tests: Recent Labs  Lab 04/01/19 0304 04/03/19 0335  AST 26 24  ALT 29 28  ALKPHOS 101 93  BILITOT 0.7 0.9  PROT 6.8 6.9  ALBUMIN 2.6* 2.7*   HbA1C: Hgb A1c MFr Bld  Date/Time Value Ref Range Status  03/11/2019 03:38 PM 12.4 (H) 4.8 - 5.6 % Final    Comment:    (NOTE) Pre diabetes:          5.7%-6.4% Diabetes:              >6.4% Glycemic control for   <7.0% adults with diabetes   04/30/2018 01:28 AM 11.5 (H) 4.8 - 5.6 % Final    Comment:    (NOTE) Pre diabetes:          5.7%-6.4% Diabetes:              >6.4% Glycemic control for   <7.0% adults with diabetes     CBG: Recent Labs  Lab 04/06/19 1156 04/06/19 2014 04/06/19 2337 04/07/19 0349 04/07/19 0829  GLUCAP 160* 133* 119* 164* 177*     Recent Results (from the past 240 hour(s))  Culture, respiratory (non-expectorated)     Status: None   Collection Time: 03/28/19  3:58 PM  Result Value Ref Range Status   Specimen Description   Final    TRACHEAL ASPIRATE Performed at [facility], [place] [address] [place], [place] [address]    Special Requests   Final    NONE Performed at [facility], [place] [address] [place],  [address]    Gram Stain   Final    ABUNDANT WBC PRESENT,BOTH PMN AND MONONUCLEAR ABUNDANT GRAM POSITIVE COCCI IN PAIRS Performed at [facility]  [facility], [place] [address] [place], [place] [address]    Culture   Final    MODERATE METHICILLIN RESISTANT STAPHYLOCOCCUS AUREUS   Report Status 03/30/2019 FINAL  Final   Organism ID, Bacteria METHICILLIN RESISTANT STAPHYLOCOCCUS AUREUS  Final      Susceptibility   Methicillin resistant staphylococcus aureus - MIC*    CIPROFLOXACIN >=8 RESISTANT Resistant     ERYTHROMYCIN >=8 RESISTANT Resistant     GENTAMICIN <=0.5 SENSITIVE Sensitive     OXACILLIN >=4 RESISTANT Resistant     TETRACYCLINE <=1 SENSITIVE Sensitive     VANCOMYCIN 1 SENSITIVE Sensitive     TRIMETH/SULFA <=10 SENSITIVE Sensitive     CLINDAMYCIN <=0.25 SENSITIVE Sensitive     RIFAMPIN <=0.5 SENSITIVE Sensitive     Inducible Clindamycin NEGATIVE Sensitive     * MODERATE METHICILLIN RESISTANT STAPHYLOCOCCUS AUREUS     Scheduled Meds: . amLODipine  5 mg Per Tube Daily  . atorvastatin  40 mg Per Tube q1800  . chlorhexidine gluconate (MEDLINE KIT)  15 mL Mouth Rinse BID  . clopidogrel  75 mg Per Tube Daily  . enoxaparin (LOVENOX) injection  40 mg Subcutaneous Q24H  . insulin aspart  0-15 Units Subcutaneous Q4H  . mouth rinse  15 mL Mouth Rinse BID  . sodium chloride flush  10-40 mL Intracatheter Q12H   Continuous Infusions: . sodium chloride Stopped (04/07/19 0300)  . dextrose 5 % and 0.45 % NaCl with KCl 20 mEq/L 60 mL/hr at 04/07/19 0727  . feeding  supplement (OSMOLITE 1.5 CAL) 20 mL/hr at 04/06/19 1900     LOS: 40 days   Cherene Altes, MD [facility] Office  [phone] Pager - Text Page per Amion  If 7PM-7AM, please contact night-coverage per Amion 04/07/2019, 9:06 AM

## 2019-04-08 ENCOUNTER — Inpatient Hospital Stay (HOSPITAL_COMMUNITY): Payer: 59

## 2019-04-08 LAB — CBC
HCT: 33.6 % — ABNORMAL LOW (ref 39.0–52.0)
Hemoglobin: 10.5 g/dL — ABNORMAL LOW (ref 13.0–17.0)
MCH: 26.8 pg (ref 26.0–34.0)
MCHC: 31.3 g/dL (ref 30.0–36.0)
MCV: 85.7 fL (ref 80.0–100.0)
Platelets: 437 10*3/uL — ABNORMAL HIGH (ref 150–400)
RBC: 3.92 MIL/uL — ABNORMAL LOW (ref 4.22–5.81)
RDW: 13.2 % (ref 11.5–15.5)
WBC: 4.8 10*3/uL (ref 4.0–10.5)
nRBC: 0 % (ref 0.0–0.2)

## 2019-04-08 LAB — BASIC METABOLIC PANEL
Anion gap: 9 (ref 5–15)
BUN: 7 mg/dL — ABNORMAL LOW (ref 8–23)
CO2: 24 mmol/L (ref 22–32)
Calcium: 8.5 mg/dL — ABNORMAL LOW (ref 8.9–10.3)
Chloride: 105 mmol/L (ref 98–111)
Creatinine, Ser: 0.79 mg/dL (ref 0.61–1.24)
GFR calc Af Amer: >60 mL/min (ref >60)
GFR calc non Af Amer: >60 mL/min (ref >60)
Glucose, Bld: 126 mg/dL — ABNORMAL HIGH (ref 70–99)
Potassium: 3.4 mmol/L — ABNORMAL LOW (ref 3.5–5.1)
Sodium: 138 mmol/L (ref 135–145)

## 2019-04-08 LAB — GLUCOSE, CAPILLARY
Glucose-Capillary: 116 mg/dL — ABNORMAL HIGH (ref 70–99)
Glucose-Capillary: 162 mg/dL — ABNORMAL HIGH (ref 70–99)
Glucose-Capillary: 147 mg/dL — ABNORMAL HIGH (ref 70–99)
Glucose-Capillary: 134 mg/dL — ABNORMAL HIGH (ref 70–99)
Glucose-Capillary: 120 mg/dL — ABNORMAL HIGH (ref 70–99)

## 2019-04-08 MED ORDER — POTASSIUM CHLORIDE 10 MEQ/100ML IV SOLN
10.0000 meq | INTRAVENOUS | Status: AC
  Administered 2019-04-08 (×4): 10 meq via INTRAVENOUS
  Filled 2019-04-08 (×4): qty 100

## 2019-04-08 MED ORDER — LIDOCAINE 5 % EX PTCH
1.0000 | MEDICATED_PATCH | CUTANEOUS | Status: DC
  Administered 2019-04-08 – 2019-04-10 (×3): 1 via TRANSDERMAL
  Filled 2019-04-08 (×5): qty 1

## 2019-04-08 NOTE — Progress Notes (Signed)
OT Progress Note  Seen this pm to assist ST with transition to chair in preparation for MBS. Pt tolerated well. Increased level of arousal, ability to sustain attention to functional tasks and improved midline postural control during dynamic tasks. Pt continues to become more interactive with therapists. Making excellent progress. Continue to  recommend CIR for rehab.     04/08/19 1756  OT Visit Information  Last OT Received On 04/08/19  Assistance Needed +2 (to ambulate)  PT/OT/SLP Co-Evaluation/Treatment Yes  OT goals addressed during session ADL's and self-care;Other (comment) (mobility)  History of Present Illness 63 y.o. male admitted for acute respiratory failure (+) COVID 19, s/p intubation 4/2-4/11, re-intubated 4/17-4/20/20.  Pt with significant PMH of CVA and HTN.    Precautions  Precautions Fall  Pain Assessment  Pain Assessment Faces  Faces Pain Scale 4  Pain Location L shoulder? impingement  Pain Descriptors / Indicators Discomfort;Grimacing;Guarding  Pain Intervention(s) Limited activity within patient's tolerance  Cognition  Arousal/Alertness Awake/alert  Behavior During Therapy Impulsive;Restless  Overall Cognitive Status Impaired/Different from baseline  Area of Impairment Orientation;Attention;Safety/judgement;Awareness;Problem solving  Orientation Level Disoriented to;Time  Current Attention Level Sustained  Memory Decreased recall of precautions;Decreased short-term memory  Following Commands Follows one step commands consistently  Safety/Judgement Decreased awareness of safety;Decreased awareness of deficits  Awareness Emergent  Problem Solving Slow processing;Difficulty sequencing;Requires verbal cues  General Comments Continues to improve; Pt asking to call [employer] to talk with [name] (best friend); appropriately tearful during conversation; pt aware and asking about other friends at work; dealyed processing  ADL  Toileting - ArchitectClothing Manipulation  Details (indicate cue type and reason) incontinenet of urine at end of session; Max A to clean self  Functional mobility during ADLs Minimal assistance (with use of Stedy)  Bed Mobility  Overal bed mobility Needs Assistance  Sit to supine Mod assist  General bed mobility comments increased assistance due to fatigue  Balance  Sitting balance-Leahy Scale Fair  Standing balance-Leahy Scale Poor  Standing balance comment reliant on external support  Transfers  Overall transfer level Needs assistance  Transfer via Lift Equipment Stedy  Transfers Sit to/from Stand  Sit to Ni[employer]Stand Min guard  General Comments  General comments (skin integrity, edema, etc.) Assisted pt to " procedure chair" for MBS with use of Stedy. Pt able to stadn with min guard A at times. Painful L arm - pt having K+ runs, which may contribute to pain.  Pt following commands and asssiting with mobility appropriately.Pt more verbal during session and showing a sense of humor, joking with therapists appropriately. Due to MBS being cancelled due to equipment problem, pt was masked and wheeled out to the hall to socialize with staff. Improved posture and eye contact while out of room.   OT - End of Session  Equipment Utilized During Treatment Gait belt;Other (comment) Antony Salmon(Stedy)  Activity Tolerance Patient tolerated treatment well  Patient left in bed;with call bell/phone within reach;with bed alarm set;with nursing/sitter in room  Nurse Communication Mobility status;Other (comment) (L shoulder pain)  OT Assessment/Plan  OT Plan Discharge plan remains appropriate  OT Visit Diagnosis Other abnormalities of gait and mobility (R26.89);Muscle weakness (generalized) (M62.81);Pain;Other symptoms and signs involving cognitive function  Pain - Right/Left Left  Pain - part of body Shoulder  OT Frequency (ACUTE ONLY) Min 3X/week  Recommendations for Other Services Rehab consult  Follow Up Recommendations CIR;Supervision/Assistance - 24 hour   OT Equipment 3 in 1 bedside commode  AM-PAC OT "6 Clicks" Daily Activity  Outcome Measure (Version 2)  Help from another person eating meals? 2  Help from another person taking care of personal grooming? 3  Help from another person toileting, which includes using toliet, bedpan, or urinal? 2  Help from another person bathing (including washing, rinsing, drying)? 2  Help from another person to put on and taking off regular upper body clothing? 2  Help from another person to put on and taking off regular lower body clothing? 2  6 Click Score 13  OT Goal Progression  Progress towards OT goals Progressing toward goals  Acute Rehab OT Goals  Patient Stated Goal to go to [facility] with his x-wife  OT Goal Formulation With patient  Time For Goal Achievement 04/15/19  Potential to Achieve Goals Good  ADL Goals  Pt Will Perform Grooming with modified independence;sitting  Pt Will Transfer to Toilet bedside commode;with min assist;stand pivot transfer  Additional ADL Goal #1 pt will perform UB adls with mod A seated  Additional ADL Goal #2 pt will go from sit to stand with mod +2 assist for adls  Additional ADL Goal #3 Pt will tolerate A/AAROM to bil UEs to strengthening arms for adls and toilet transfers  Pt Will Perform Upper Body Bathing with set-up;with supervision;sitting  Pt Will Perform Lower Body Bathing with min assist;sit to/from stand  OT Time Calculation  OT Start Time (ACUTE ONLY) 1550  OT Stop Time (ACUTE ONLY) 1620  OT Time Calculation (min) 30 min  OT General Charges  $OT Visit 1 Visit  OT Treatments  $Self Care/Home Management  8-22 mins  Luisa Dago, OT/L   Acute OT Clinical Specialist Acute Rehabilitation Services Pager [phone] Office [phone]

## 2019-04-08 NOTE — Progress Notes (Signed)
[place] TEAM 1 - Stepdown/ICU TEAM  Benjamin Cohen  JOA:[id number] DOB: 03-31-1956 DOA: 03/11/2019 PCP: Maude Leriche, PA-C    Brief Narrative:  63 y/o M admitted 3/31 with fever, cough, SOB and confusion. He attended a wedding mid March, after which multiple family members were diagnosed with COVID including his wife. He developed worsening hypoxemia w/ respiratory distress 4/1 and PCCM was consulted for evaluation. Intubated 4/2 with ARDS. Extubated 4/12. Had to be reintubated 4/17.  Upon further stability-he was transferred out of the ICU- post extubation course complicated by severe delirium, severe dysphagia requiring NG tube.  Unfortunately, patient has pulled out his NG tube repeatedly-speech therapy following closely to see if diet can be reinitiated-remains n.p.o.  Significant Events: 3/31 Admit, 2L, bilateral patchy infiltrates  4/01 PCCM consulted 4/02 Intubated 4/03 toclizamab x 1 4/05 Net positive, remains on paralytic, still prone 4/06 Supine, plateau pressure 23, 50% / 12 PEEP; complete 5 days of plaquenil 4/08 Plateau 23, PEEP 12 / 40% 4/14 transfer to floor bed 4/17 re-intubated   4/20 extubated 4/22 SLP eval > NPO 4/24 NG tube reinserted and NG feedings started 4/25 to 4/27>> repeatedly has pulled out NG tube-requiring reinsertion  COVID-19 specific Treatment: Actemra x1 Plaquenil 4/1 > 4/6   Ventilator Settings: NA - extubated 4/20  Subjective: Patient awake alert.  Noted to be mildly distracted.  Denies any shortness of breath.  No pain.    Assessment & Plan:  Acute Hypoxic Resp Failure - Covid 19 associated ARDS - MRSA Pneumonia:  Patient remains stable from a respiratory standpoint.  He is currently on room air saturating in the 90s.  Patient has completed course of IV vancomycin.  Continue supportive care.  Acute metabolic encephalopathy - ICU delirium:  Slowly improving.  Still cannot distracted.  Answers a few questions appropriately.  Does  follow command.  Minimize sedatives.  Continue to mobilize.  Dysphagia- post extubation Probably secondary to prolonged intubation, he also has a history of prior CVA.  Patient has pulled out his feeding tube multiple times.  Speech therapy to reevaluate today.  Apparently plan is to do modified barium swallow in house today.  Hopefully he will be cleared for oral intake.  If not will need to determine the next steps regarding nutrition.    Essential hypertension  Continue amlodipine.    History of CVA Continue Plavix and statin.  Stable.    Hypokalemia Potassium 3.4 today.  This will be repleted.  Magnesium has not been checked recently.  We will order for tomorrow morning.  DM2 CBGs are reasonably well controlled.  Continue SSI.    Normocytic anemia Secondary to acute illness.  Hemoglobin remained stable.  No evidence of overt bleeding.    Nutrition Speech to reevaluate today.  Will likely need another attempt at feeding tube placement if he does not pass swallow evaluation.    Deconditioning Secondary to acute/critical illness-CIR evaluation in progress.  DVT prophylaxis: lovenox  Code Status: FULL CODE Family Communication: Discussed with family Disposition Plan: Evaluated by CIR.  However they need to negative COVID-19 tests.  Consultants:  PCCM  Antimicrobials:  Vanc 4/17 > 4/23  Objective: Blood pressure (!) 155/94, pulse (!) 101, temperature 98.2 F (36.8 C), temperature source Oral, resp. rate (!) 24, height 5' 7"  (1.702 m), weight 102 kg, SpO2 98 %.  Intake/Output Summary (Last 24 hours) at 04/08/2019 1208 Last data filed at 04/08/2019 0600 Gross per 24 hour  Intake 1478.15 ml  Output 800 ml  Net 678.15 ml   Filed Weights   04/05/19 0500 04/07/19 0500 04/08/19 0405  Weight: 103 kg 101.7 kg 102 kg    Examination: General appearance: Awake aler t.  In no distress Resp: Clear to auscultation bilaterally.  Normal effort Cardio: S1-S2 is normal regular.   No S3-S4.  No rubs murmurs or bruit GI: Abdomen is soft.  Nontender nondistended.  Bowel sounds are present normal.  No masses organomegaly Extremities: No edema.  Full range of motion of lower extremities. Neurologic: Alert.  Oriented to place city Person.  Not to year.  No obvious deficits noted.   CBC: Recent Labs  Lab 04/03/19 0335 04/03/19 0435 04/05/19 0400 04/08/19 0400  WBC 5.6  --  6.7 4.8  HGB 10.8* 9.9* 10.1* 10.5*  HCT 34.8* 29.0* 33.4* 33.6*  MCV 86.4  --  86.8 85.7  PLT 242  --  353 956*   Basic Metabolic Panel: Recent Labs  Lab 04/03/19 0335  04/06/19 0427 04/07/19 0540 04/08/19 0400  NA 141   < > 139 138 138  K 3.1*   < > 3.7 4.1 3.4*  CL 108   < > 106 105 105  CO2 22   < > 23 24 24   GLUCOSE 164*   < > 196* 274* 126*  BUN 12   < > 7* 6* 7*  CREATININE 0.77   < > 0.78 0.80 0.79  CALCIUM 8.1*   < > 8.6* 8.5* 8.5*  MG 2.1  --   --   --   --    < > = values in this interval not displayed.   GFR: Estimated Creatinine Clearance: 104.8 mL/min (by C-G formula based on SCr of 0.79 mg/dL).  Liver Function Tests: Recent Labs  Lab 04/03/19 0335  AST 24  ALT 28  ALKPHOS 93  BILITOT 0.9  PROT 6.9  ALBUMIN 2.7*   HbA1C: Hgb A1c MFr Bld  Date/Time Value Ref Range Status  03/11/2019 03:38 PM 12.4 (H) 4.8 - 5.6 % Final    Comment:    (NOTE) Pre diabetes:          5.7%-6.4% Diabetes:              >6.4% Glycemic control for   <7.0% adults with diabetes   04/30/2018 01:28 AM 11.5 (H) 4.8 - 5.6 % Final    Comment:    (NOTE) Pre diabetes:          5.7%-6.4% Diabetes:              >6.4% Glycemic control for   <7.0% adults with diabetes     CBG: Recent Labs  Lab 04/07/19 2133 04/07/19 2315 04/08/19 0353 04/08/19 0810 04/08/19 1132  GLUCAP 108* 122* 116* 162* 147*    No results found for this or any previous visit (from the past 240 hour(s)).   Scheduled Meds: . amLODipine  5 mg Per Tube Daily  . atorvastatin  40 mg Per Tube q1800  .  chlorhexidine gluconate (MEDLINE KIT)  15 mL Mouth Rinse BID  . clopidogrel  75 mg Per Tube Daily  . enoxaparin (LOVENOX) injection  40 mg Subcutaneous Q24H  . insulin aspart  0-15 Units Subcutaneous Q4H  . mouth rinse  15 mL Mouth Rinse BID  . sodium chloride flush  10-40 mL Intracatheter Q12H   Continuous Infusions: . sodium chloride Stopped (04/07/19 0300)  . dextrose 5 % and 0.45 % NaCl with KCl 20 mEq/L 60 mL/hr at 04/08/19  0600  . feeding supplement (OSMOLITE 1.5 CAL) 20 mL/hr at 04/06/19 1900     LOS: 28 days   [facility] Office  [phone] Pager - Text Page per Shea Pennebaker  If 7PM-7AM, please contact night-coverage per Amion 04/08/2019, 12:08 PM

## 2019-04-08 NOTE — Progress Notes (Signed)
  Speech Language Pathology Treatment: Dysphagia;Cognitive-Linquistic  Patient Details Name: Benjamin Cohen MRN: [id number] DOB: 05/22/1956 Today's Date: 04/08/2019 Time: 1510-1550 SLP Time Calculation (min) (ACUTE ONLY): 40 min  Assessment / Plan / Recommendation Clinical Impression  Co-treated with OT in order to prepare pt for transport to radiology for MBS.  MBS, unfortunately, was ultimately cancelled due to equipment malfunction. Benjamin Cohen demonstrated improved spontaneity and use of humor today.  He continues to require mod verbal cues to focus/sustain attention. When cued, he is able to increase volume so that speech is audible; generally, he tends to be hypophonic and speak in 2-3 word sentences.  (His ex-wife reports that at baseline he is talkative).  Pt continues to require verbal cues to swallow with effort, followed by immediate throat clear for airway protection.  Per radiology, equipment will be ready by tomorrow.  Will plan for [facility] 4/29.  Continue meds in puree and ice chips pending study.    HPI HPI: 63 y/o M admitted 3/31 COVID (+).Developed worsening hypoxemia, respiratory distress 4/1 and PCCM consulted for evaluation. Intubated 4/2 with ARDS.Was extubated 03/23/2019, with no oxygen requirement. Initial clinical swallow evaluation 4/13 revealed concerns for severe dysphagia; NPO was recommended, TF.  Pt developed respiratory distress and was reintubated on 03/28/2019; extubated 4/20. Hx prior strokes.       SLP Plan  Continue with current plan of care;MBS       Recommendations  Diet recommendations: Other(comment)(ice chips) Medication Administration: Crushed with puree Compensations: Minimize environmental distractions                Oral Care Recommendations: Oral care QID Follow up Recommendations: Inpatient Rehab SLP Visit Diagnosis: Dysphagia, unspecified (R13.10) Plan: Continue with current plan of care;MBS       GO                [name] [name] 04/08/2019, 5:17 PM  Heela Heishman L. [name], MA CCC/SLP Acute Rehabilitation Services Office number [phone] CGV iphone for SLP: [phone]

## 2019-04-08 NOTE — Plan of Care (Signed)
  Problem: Health Behavior/Discharge Planning: Goal: Ability to manage health-related needs will improve Outcome: Progressing   Problem: Clinical Measurements: Goal: Ability to maintain clinical measurements within normal limits will improve Outcome: Progressing Goal: Will remain free from infection Outcome: Progressing Goal: Diagnostic test results will improve Outcome: Progressing Goal: Respiratory complications will improve Outcome: Progressing Goal: Cardiovascular complication will be avoided Outcome: Progressing   Problem: Activity: Goal: Risk for activity intolerance will decrease Outcome: Progressing   Problem: Nutrition: Goal: Adequate nutrition will be maintained Outcome: Progressing   Problem: Coping: Goal: Level of anxiety will decrease Outcome: Progressing   Problem: Elimination: Goal: Will not experience complications related to bowel motility Outcome: Progressing Goal: Will not experience complications related to urinary retention Outcome: Progressing   Problem: Safety: Goal: Ability to remain free from injury will improve Outcome: Progressing   Problem: Skin Integrity: Goal: Risk for impaired skin integrity will decrease Outcome: Progressing   Problem: Education: Goal: Knowledge of General Education information will improve Description Including pain rating scale, medication(s)/side effects and non-pharmacologic comfort measures Outcome: Progressing   Problem: Health Behavior/Discharge Planning: Goal: Ability to manage health-related needs will improve Outcome: Progressing   Problem: Clinical Measurements: Goal: Ability to maintain clinical measurements within normal limits will improve Outcome: Progressing Goal: Will remain free from infection Outcome: Progressing Goal: Diagnostic test results will improve Outcome: Progressing Goal: Respiratory complications will improve Outcome: Progressing Goal: Cardiovascular complication will be  avoided Outcome: Progressing   Problem: Activity: Goal: Risk for activity intolerance will decrease Outcome: Progressing   Problem: Nutrition: Goal: Adequate nutrition will be maintained Outcome: Progressing   Problem: Coping: Goal: Level of anxiety will decrease Outcome: Progressing   Problem: Elimination: Goal: Will not experience complications related to bowel motility Outcome: Progressing Goal: Will not experience complications related to urinary retention Outcome: Progressing   Problem: Safety: Goal: Ability to remain free from injury will improve Outcome: Progressing   Problem: Skin Integrity: Goal: Risk for impaired skin integrity will decrease Outcome: Progressing   Problem: Activity: Goal: Ability to tolerate increased activity will improve Outcome: Progressing

## 2019-04-08 NOTE — Progress Notes (Signed)
Occupational Therapy Treatment Patient Details Name: Benjamin Cohen MRN: [id number] DOB: 01/23/1956 Today's Date: 04/08/2019    History of present illness 63 y.o. male admitted for acute respiratory failure (+) COVID 19, s/p intubation 4/2-4/11, re-intubated 4/17-4/20/20.  Pt with significant PMH of CVA and HTN.     OT comments  Making steady progress. Increased midline postural control sitting EOB and improved sustained attention to participate with ADL tasks. Pt contacted his co-worker and engaged in conversation with apparent delay in processing and responses, however it was very meaningful to the pt. Pt with new onset of L shoulder pain with this admission. Appears to demonstrate a + impingement sign with FFwith abnormal scapular humeral rhythm. Left MD message regarding possible pain patch for L anterior shoulder while working to increase functional use of L UE. Pt issued an adult coloring book to work on increasing BUE integration in addition to attention and problem solving. Will continue ot follow acutely.  Follow Up Recommendations  CIR;Supervision/Assistance - 24 hour    Equipment Recommendations  3 in 1 bedside commode    Recommendations for Other Services Rehab consult    Precautions / Restrictions Precautions Precautions: Fall Precaution Comments: restlees in bed, had reomed gown,catheter and heart monitor leads       Mobility Bed Mobility Overal bed mobility: Needs Assistance Bed Mobility: Sidelying to Sit;Sit to Sidelying Rolling: Supervision Sidelying to sit: Min assist     Sit to sidelying: Min assist General bed mobility comments: Pain with pushing through LUE; may want to try to get up on R side of bed  Transfers Overall transfer level: Needs assistance Equipment used: 1 person hand held assist Transfers: Sit to/from Stand Sit to Stand: Min assist            Balance     Sitting balance-Leahy Scale: Fair Sitting balance - Comments: able to sit  unsupoorted EOB     Standing balance-Leahy Scale: Poor Standing balance comment: reliant on external support                           ADL either performed or assessed with clinical judgement   ADL Overall ADL's : Needs assistance/impaired     Grooming: Wash/dry face;Wash/dry hands;Set up;Supervision/safety;Sitting               Lower Body Dressing: Bed level;Set up Lower Body Dressing Details (indicate cue type and reason): donning socks in bed. Pt given socks to put on while OT went to get linens. Pt had socks on when OT returned.             Functional mobility during ADLs: Minimal assistance(sit - stand only)       Vision  Pt wears glasses - unfortunately at home     Perception     Praxis      Cognition Arousal/Alertness: Awake/alert Behavior During Therapy: Restless;Impulsive Overall Cognitive Status: Impaired/Different from baseline Area of Impairment: Orientation;Attention;Safety/judgement;Awareness;Problem solving                 Orientation Level: Disoriented to;Time(knows it is April) Current Attention Level: Sustained   Following Commands: Follows one step commands consistently Safety/Judgement: Decreased awareness of safety;Decreased awareness of deficits Awareness: Emergent Problem Solving: Slow processing;Difficulty sequencing;Requires verbal cues General Comments: Continues to improve; Pt asking to call [employer] to talk with Benjamin Cohen (best friend); appropriately tearful during conversation; pt aware and asking about other friends at work; dealyed processing  Exercises Exercises: Other exercises General Exercises - Upper Extremity Shoulder Flexion: Left;10 reps;AAROM;PROM;Supine Other Exercises Other Exercises: L scapular elevation/depression in R sidelying following by gentle P/AAROM FF in supine using gentle distraction and facilitating scapular depression   Shoulder Instructions       General Comments Pt more  interactive and asking to call his work "[employer] : to talk with his friend Benjamin StanleyStacey. Pt dialed the number himself. Voice was quiet, but appropiriate. Pt appropriately became upset when hearing his friend's voice. Pt appropirately asked about fellow emplyees. Benjamin ArnoldStacy was excited to talk with his friend and provided encouragement. Pt very appreciative. Pt is "artistic". Pt given adult coloring book and able to sustain attention to color for at least 15 min. Pt manipulating caps and appropriately kept pens on tray.     Pertinent Vitals/ Pain       Pain Assessment: Faces(Simultaneous filing. User may not have seen previous data.) Faces Pain Scale: Hurts little more(Simultaneous filing. User may not have seen previous data.) Pain Location: L shoulder? impingement(Simultaneous filing. User may not have seen previous data.) Pain Descriptors / Indicators: Discomfort;Grimacing;Guarding(Simultaneous filing. User may not have seen previous data.) Pain Intervention(s): Limited activity within patient's tolerance;Repositioned;Other (comment)(MD notified Simultaneous filing. User may not have seen previous data.)  Home Living                                          Prior Functioning/Environment              Frequency  Min 3X/week        Progress Toward Goals  OT Goals(current goals can now be found in the care plan section)  Progress towards OT goals: Progressing toward goals;OT to reassess next treatment  Acute Rehab OT Goals Patient Stated Goal[facility] OT Goal Formulation: With patient Time For Goal Achievement: 04/15/19 Potential to Achieve Goals: Good ADL Goals Pt Will Perform Grooming: with modified independence;sitting Pt Will Perform Upper Body Bathing: with set-up;with supervision;sitting Pt Will Perform Lower Body Bathing: with min assist;sit to/from stand Pt Will Transfer to Toilet: bedside commode;with min assist;stand pivot  transfer Additional ADL Goal #1: pt will perform UB adls with mod A seated Additional ADL Goal #2: pt will go from sit to stand with mod +2 assist for adls Additional ADL Goal #3: Pt will tolerate A/AAROM to bil UEs to strengthening arms for adls and toilet transfers  Plan Discharge plan remains appropriate    Co-evaluation                 AM-PAC OT "6 Clicks" Daily Activity     Outcome Measure   Help from another person eating meals?: A Lot(ice chips) Help from another person taking care of personal grooming?: A Little Help from another person toileting, which includes using toliet, bedpan, or urinal?: A Lot Help from another person bathing (including washing, rinsing, drying)?: A Lot Help from another person to put on and taking off regular upper body clothing?: A Lot Help from another person to put on and taking off regular lower body clothing?: A Lot 6 Click Score: 13    End of Session    OT Visit Diagnosis: Other abnormalities of gait and mobility (R26.89);Muscle weakness (generalized) (M62.81);Pain;Other symptoms and signs involving cognitive function Pain - Right/Left: Left Pain - part of body: Shoulder   Activity Tolerance  Patient tolerated treatment well   Patient Left in bed;with call bell/phone within reach;with bed alarm set   Nurse Communication Mobility status        Time: 1230-1320 OT Time Calculation (min): 50 min  Charges: OT General Charges $OT Visit: 1 Visit OT Treatments $Self Care/Home Management : 8-22 mins $Therapeutic Activity: 8-22 mins $Therapeutic Exercise: 8-22 mins  Luisa Dago, OT/L   Acute OT Clinical Specialist Acute Rehabilitation Services Pager [phone] Office [phone]    [facility] 04/08/2019, 5:36 PM

## 2019-04-08 NOTE — Progress Notes (Signed)
Physical Therapy Treatment Patient Details Name: Benjamin Cohen MRN: [id number] DOB: [birth year] Today's Date: 04/08/2019    History of Present Illness 63 y.o. male admitted for acute respiratory failure (+) COVID 19, s/p intubation 4/2-4/11, re-intubated 4/17-4/20/20.  Pt with significant PMH of CVA and HTN.      PT Comments     The  Patient noted to be restless in bed, removed gown and leads, incontinent of urine. Patient assisted to sitting then standing x 3 with Bilateral UE support. Patient  Demonstrates improved mobility and ,sitting balance. Patient requested urinal and attempted use while sitting on bed edge.Continue PT for mobility and progress ambulation trial.  Follow Up Recommendations  CIR     Equipment Recommendations  Rolling walker with 5" wheels;3in1 (PT);Wheelchair (measurements PT);Wheelchair cushion (measurements PT)    Recommendations for Other Services Rehab consult     Precautions / Restrictions Precautions Precautions: Fall Precaution Comments: restlees in bed, had reomed gown,catheter and heart monitor leads    Mobility  Bed Mobility   Bed Mobility: Rolling;Sidelying to Sit Rolling: Supervision Sidelying to sit: Min assist       General bed mobility comments: patient self initiated  rolling, placed legs over bed edge , began to push self upright., to return to sidlying,placed legs onto bed  Transfers Overall transfer level: Needs assistance   Transfers: Sit to/from Stand Sit to Stand: Mod assist;+2 safety/equipment         General transfer comment: Cues to reach up to back of recliner, noted to reach  with right better than left but did  reach up to recliner. patient stood x 3. patient took  4 side steps along bed with 2 assist. Noted decreased stepping with left leg.  Ambulation/Gait                 Stairs             Wheelchair Mobility    Modified Rankin (Stroke Patients Only)       Balance                                            Cognition Arousal/Alertness: Awake/alert Behavior During Therapy: Flat affect;Restless Overall Cognitive Status: Impaired/Different from baseline Area of Impairment: Orientation;Attention;Memory;Following commands;Safety/judgement;Awareness;Problem solving                   Current Attention Level: Sustained         Problem Solving: Slow processing;Decreased initiation;Difficulty sequencing;Requires verbal cues;Requires tactile cues General Comments: patient attempting verbalizations but very soft.      Exercises      General Comments        Pertinent Vitals/Pain Pain Assessment: No/denies pain    Home Living                      Prior Function            PT Goals (current goals can now be found in the care plan section) Progress towards PT goals: Progressing toward goals    Frequency    Min 4X/week      PT Plan Current plan remains appropriate    Co-evaluation              AM-PAC PT "6 Clicks" Mobility   Outcome Measure  Help needed turning from your back to your side while in  a flat bed without using bedrails?: A Lot Help needed moving from lying on your back to sitting on the side of a flat bed without using bedrails?: A Lot Help needed moving to and from a bed to a chair (including a wheelchair)?: Total Help needed standing up from a chair using your arms (e.g., wheelchair or bedside chair)?: Total Help needed to walk in hospital room?: Total Help needed climbing 3-5 steps with a railing? : Total 6 Click Score: 8    End of Session Equipment Utilized During Treatment: Gait belt Activity Tolerance: Patient tolerated treatment well Patient left: in bed;with call bell/phone within reach;with bed alarm set Nurse Communication: Mobility status PT Visit Diagnosis: Unsteadiness on feet (R26.81)     Time: 4492-0100 PT Time Calculation (min) (ACUTE ONLY): 29 min  Charges:  $Therapeutic  Activity: 23-37 mins                     Blanchard Kelch PT Acute Rehabilitation Services Pager [phone] Office [phone]    Rada Hay 04/08/2019, 3:50 PM

## 2019-04-09 ENCOUNTER — Inpatient Hospital Stay (HOSPITAL_COMMUNITY): Payer: 59

## 2019-04-09 DIAGNOSIS — I639 Cerebral infarction, unspecified: Secondary | ICD-10-CM

## 2019-04-09 LAB — GLUCOSE, CAPILLARY
Glucose-Capillary: 119 mg/dL — ABNORMAL HIGH (ref 70–99)
Glucose-Capillary: 133 mg/dL — ABNORMAL HIGH (ref 70–99)
Glucose-Capillary: 141 mg/dL — ABNORMAL HIGH (ref 70–99)
Glucose-Capillary: 142 mg/dL — ABNORMAL HIGH (ref 70–99)
Glucose-Capillary: 143 mg/dL — ABNORMAL HIGH (ref 70–99)
Glucose-Capillary: 149 mg/dL — ABNORMAL HIGH (ref 70–99)
Glucose-Capillary: 150 mg/dL — ABNORMAL HIGH (ref 70–99)

## 2019-04-09 LAB — BASIC METABOLIC PANEL
Anion gap: 10 (ref 5–15)
BUN: 6 mg/dL — ABNORMAL LOW (ref 8–23)
CO2: 24 mmol/L (ref 22–32)
Calcium: 8.6 mg/dL — ABNORMAL LOW (ref 8.9–10.3)
Chloride: 105 mmol/L (ref 98–111)
Creatinine, Ser: 0.79 mg/dL (ref 0.61–1.24)
GFR calc Af Amer: >60 mL/min (ref >60)
GFR calc non Af Amer: >60 mL/min (ref >60)
Glucose, Bld: 138 mg/dL — ABNORMAL HIGH (ref 70–99)
Potassium: 3.4 mmol/L — ABNORMAL LOW (ref 3.5–5.1)
Sodium: 139 mmol/L (ref 135–145)

## 2019-04-09 LAB — MAGNESIUM: Magnesium: 1.9 mg/dL (ref 1.7–2.4)

## 2019-04-09 LAB — NOVEL CORONAVIRUS, NAA (HOSP ORDER, SEND-OUT TO REF LAB; TAT 18-24 HRS): SARS-CoV-2, NAA: NOT DETECTED

## 2019-04-09 MED ORDER — RESOURCE THICKENUP CLEAR PO POWD
ORAL | Status: DC | PRN
  Filled 2019-04-09: qty 125

## 2019-04-09 MED ORDER — POTASSIUM CHLORIDE 10 MEQ/100ML IV SOLN
10.0000 meq | INTRAVENOUS | Status: AC
  Administered 2019-04-09 (×4): 10 meq via INTRAVENOUS
  Filled 2019-04-09 (×4): qty 100

## 2019-04-09 MED ORDER — METOPROLOL TARTRATE 25 MG PO TABS
25.0000 mg | ORAL_TABLET | Freq: Two times a day (BID) | ORAL | Status: DC
  Administered 2019-04-09 – 2019-04-11 (×5): 25 mg via ORAL
  Filled 2019-04-09 (×5): qty 1

## 2019-04-09 NOTE — Progress Notes (Signed)
Notified by central telemetry that patient was experiencing runs of vtach. Assessed patient. No complaints of chest pain, no SOB. States he's looking for his phone to call his son. Dialed son's phone number for patient. Will continue to assess

## 2019-04-09 NOTE — Progress Notes (Signed)
Modified Barium Swallow Progress Note  Patient Details  Name: Benjamin Cohen MRN: [id number] Date of Birth: 03-03-1956  Today's Date: 04/09/2019  Modified Barium Swallow completed.  Full report located under Chart Review in the Imaging Section.  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 He has lingual pumping and slow transit. Mild lingual residue is present. When drinking thin or thickened liquids via straw he does not form cohesive boluses, more so letting liquids spill passively into the pharynx as he simlutaneously keeps sucking more liquid up via straw. Consequently, his pyriforms become more filled with liquids before the swallow, and he has small amounts of silent aspiration that comes posteriorly over the arytenoids. He forms a more cohesive bolus with cup sips of nectar thick liquids and solids, with smaller amounts of premature spillage. Hyolaryngeal movement, base of tongue retraction, and epiglottic inversion are reduced, so he has residue in his valleculae and pyriform sinuses, but this is reduced either with additional swallows (either Min cues for volitional ones or sometimes simply as he swallows the next bolus). Intermittent, trace amounts of penetration occurs with cup sips of nectar thick liquids, but this also clears well as he continues to swallow. Recommend starting Dys 2 (chopped) diet and nectar thick liquids (no straws). 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   Swallow Evaluation Recommendations       SLP Diet Recommendations: Dysphagia 2 (Fine chop) solids;Nectar thick liquid   Liquid  Administration via: Cup;No straw   Medication Administration: Whole meds with puree(crush large pills)   Supervision: Patient able to self feed;Full supervision/cueing for compensatory strategies   Compensations: Minimize environmental distractions;Slow rate;Small sips/bites;Multiple dry swallows after each bite/sip   Postural Changes: Seated upright at 90 degrees   Oral Care Recommendations: Oral care BID   Other Recommendations: Order thickener from pharmacy;Prohibited food (jello, ice cream, thin soups);Remove water pitcher    Virl Axe Brantly Kalman 04/09/2019,2:19 PM   Ivar Drape, M.A. CCC-SLP Acute Herbalist [phone] Office [phone]

## 2019-04-09 NOTE — Progress Notes (Signed)
04/09/19 1640  PT Visit Information   Last PT Received On 04/09/19  Assistance Needed +2  History of Present Illness 63 y.o. male admitted for acute respiratory failure (+) COVID 19, s/p intubation 4/2-4/11, re-intubated 4/17-4/20/20.  Pt with significant PMH of CVA and HTN.    Subjective Data  Patient Stated Goal to go to [facility] with his x-wife  Precautions  Precautions Fall  Precaution Comments patient has high fall risk from recliner  Restrictions  Weight Bearing Restrictions No  Pain Assessment  Faces Pain Scale 0  Pain Location grimacing upon return to his room - said it was his "private part"  Pain Descriptors / Indicators Grimacing  Cognition  Arousal/Alertness Awake/alert  Behavior During Therapy WFL for tasks assessed/performed  Overall Cognitive Status Impaired/Different from baseline  Area of Impairment Orientation;Attention;Safety/judgement;Awareness;Problem solving  Orientation Level Disoriented to;Time  Current Attention Level Sustained  Memory Decreased recall of precautions;Decreased short-term memory  Following Commands Follows one step commands consistently  Safety/Judgement Decreased awareness of safety;Decreased awareness of deficits  Awareness Emergent  Problem Solving Slow processing;Difficulty sequencing;Requires verbal cues  General Comments patient asked therapist where his mask was before leaving radiology.  Difficult to assess due to Impaired communication (quiet voice, difficult to understand)  Bed Mobility  Overal bed mobility Needs Assistance  Bed Mobility Sit to Sidelying  Rolling Supervision     Sit to sidelying Min assist (patient self assisted legs onto bed)  Transfers  Overall transfer level Needs assistance  Equipment used Rolling walker (2 wheeled)  Transfer via Psychologist, forensic  Transfers Sit to/from [employer] Transfers  Sit to [employer] assist at [employer], cues for hand placement. Decreased grip of left hand  Stand pivot  transfers Mod assist;+2 safety/equipment  Squat pivot transfers   General transfer comment Patient required frequent cues to take steps to bed from high chair  back onto bed, leading with the left  Side which is less stable and strong  Balance  Overall balance assessment Needs assistance  Sitting-balance support Feet supported;Bilateral upper extremity supported  Sitting balance-Leahy Scale Fair  Sitting balance - Comments able to sit unsupoorted EOB  Postural control Posterior lean  Standing balance support Bilateral upper extremity supported  Standing balance-Leahy Scale Poor  Standing balance comment reliant on external support  Exercises  Exercises Other exercises          PT - End of Session  Equipment Utilized During Treatment Gait belt  Activity Tolerance Patient tolerated treatment well  Patient left in bed;with nursing/sitter in room;with bed alarm set (with Speech , to go for study)  Nurse Communication Mobility status   PT - Assessment/Plan  PT Plan Current plan remains appropriate  PT Visit Diagnosis Unsteadiness on feet (R26.81)  PT Frequency (ACUTE ONLY) Min 4X/week  Recommendations for Other Services Rehab consult  Follow Up Recommendations CIR  PT equipment Rolling walker with 5" wheels;3in1 (PT);Wheelchair (measurements PT);Wheelchair cushion (measurements PT)  AM-PAC PT "6 Clicks" Mobility Outcome Measure (Version 2)  Help needed turning from your back to your side while in a flat bed without using bedrails? 3  Help needed moving from lying on your back to sitting on the side of a flat bed without using bedrails? 3  Help needed moving to and from a bed to a chair (including a wheelchair)? 2  Help needed standing up from a chair using your arms (e.g., wheelchair or bedside chair)? 2  Help needed to walk in hospital room? 1  Help needed  climbing 3-5 steps with a railing?  1  6 Click Score 12  Consider Recommendation of Discharge To: CIR/SNF/LTACH  PT Goal  Progression  Progress towards PT goals Progressing toward goals  PT Time Calculation  PT Start Time (ACUTE ONLY) 1225  PT Stop Time (ACUTE ONLY) 1243  PT Time Calculation (min) (ACUTE ONLY) 18 min  PT General Charges  $$ ACUTE PT VISIT 1 Visit  PT Treatments  $Therapeutic Activity 8-22 mins

## 2019-04-09 NOTE — Plan of Care (Signed)
  Problem: Clinical Measurements: Goal: Ability to maintain clinical measurements within normal limits will improve Outcome: Progressing   

## 2019-04-09 NOTE — Progress Notes (Signed)
At 0211, central telemetry again notified that patient had Ventricular tachycardia, non-sustained 12 beat run of VT f/u 6 bt run f/u 2 4 bt runs. Pt again, asymptomatic. Continuing to monitor patient.

## 2019-04-09 NOTE — Progress Notes (Signed)
Pt found in floor. No signs of injury. Pt remains oriented to self only. Assisted back to chair by x2 staff. VS and assessment noted. MD notified and charge nurse. Attempted to notify family listed on facesheet. No answer received. Bed placed in lowest position. Fall mats at bedside. Call bell in reach and reminded of use. Bed alarm on. Requested 1 to1 for safety.

## 2019-04-09 NOTE — Progress Notes (Signed)
Physical Therapy Treatment Patient Details Name: Benjamin Cohen MRN: [id number] DOB: 1956/08/21 Today's Date: 04/09/2019    History of Present Illness 63 y.o. male admitted for acute respiratory failure (+) COVID 19, s/p intubation 4/2-4/11, re-intubated 4/17-4/20/20.  Pt with significant PMH of CVA and HTN.      PT Comments    The patient is  Improving in bed mobility, standing and transfers. Left U/LE's remain weak and decreased support of  LLE at times when pivoting. Patient going for swallow study.   Follow Up Recommendations  CIR     Equipment Recommendations  Rolling walker with 5" wheels;3in1 (PT);Wheelchair (measurements PT);Wheelchair cushion (measurements PT)    Recommendations for Other Services Rehab consult     Precautions / Restrictions Precautions Precautions: Fall Precaution Comments: patient has high fall risk from recliner Restrictions Weight Bearing Restrictions: No    Mobility  Bed Mobility Overal bed mobility: Needs Assistance Bed Mobility: Sidelying to Sit Rolling: Supervision Sidelying to sit: Min assist Supine to sit: +2 for physical assistance;Max assist;HOB elevated Sit to supine: Mod assist Sit to sidelying: Min assist General bed mobility comments:  patient initiated legs and required  trunk assist  from left  side  Transfers Overall transfer level: Needs assistance Equipment used: Rolling walker (2 wheeled) Transfers: Sit to/from [employer] CorporationStand;Stand Pivot Transfers Sit to Stand: Min assist Stand pivot transfers: Mod assist;+2 safety/equipment Squat pivot transfers: Max assist;+2 physical assistance     General transfer comment: Patient required frequent cues to take steps to Chair to go to swallow studty which was high. Patient scooted self back into recliner  Ambulation/Gait                 Stairs             Wheelchair Mobility    Modified Rankin (Stroke Patients Only)       Balance Overall balance assessment:  Needs assistance Sitting-balance support: Feet supported;Bilateral upper extremity supported Sitting balance-Leahy Scale: Fair Sitting balance - Comments: able to sit unsupoorted EOB Postural control: Posterior lean Standing balance support: Bilateral upper extremity supported Standing balance-Leahy Scale: Poor Standing balance comment: reliant on external support                            Cognition Arousal/Alertness: Awake/alert Behavior During Therapy: WFL for tasks assessed/performed Overall Cognitive Status: Impaired/Different from baseline Area of Impairment: Orientation;Attention;Safety/judgement;Awareness;Problem solving                 Orientation Level: Disoriented to;Time Current Attention Level: Sustained Memory: Decreased recall of precautions;Decreased short-term memory Following Commands: Follows one step commands consistently Safety/Judgement: Decreased awareness of safety;Decreased awareness of deficits Awareness: Emergent Problem Solving: Slow processing;Difficulty sequencing;Requires verbal cues General Comments: Continues to improve; follows directions for mobility      Exercises General Exercises - Upper Extremity Shoulder Flexion: Left;10 reps;AAROM;PROM;Supine Elbow Flexion: Strengthening;Both;20 reps General Exercises - Lower Extremity Ankle Circles/Pumps: AAROM;Both;20 reps;Supine Short Arc Quad: AAROM;Both;10 reps;Supine Long Arc Quad: AROM;Both;5 reps Hip ABduction/ADduction: AAROM;Both;10 reps;Supine Hip Flexion/Marching: AROM;Both;5 reps Other Exercises Other Exercises: L scapular elevation/depression in R sidelying following by gentle P/AAROM FF in supine using gentle distraction and facilitating scapular depression    General Comments        Pertinent Vitals/Pain Pain Assessment: No/denies pain Faces Pain Scale: No hurt Pain Location: grimacing upon return to his room - said it was his "private part" Pain Descriptors /  Indicators: Grimacing Pain Intervention(s):  Limited activity within patient's tolerance;Monitored during session;Repositioned    Home Living                      Prior Function            PT Goals (current goals can now be found in the care plan section) Acute Rehab PT Goals Patient Stated Goal: to go to [facility] with his x-wife Progress towards PT goals: Progressing toward goals    Frequency    Min 4X/week      PT Plan Current plan remains appropriate    Co-evaluation   Reason for Co-Treatment: Necessary to address cognition/behavior during functional activity   OT goals addressed during session: ADL's and self-care SLP goals addressed during session: Communication;Cognition    AM-PAC PT "6 Clicks" Mobility   Outcome Measure  Help needed turning from your back to your side while in a flat bed without using bedrails?: A Little Help needed moving from lying on your back to sitting on the side of a flat bed without using bedrails?: A Little Help needed moving to and from a bed to a chair (including a wheelchair)?: A Lot Help needed standing up from a chair using your arms (e.g., wheelchair or bedside chair)?: A Lot Help needed to walk in hospital room?: Total Help needed climbing 3-5 steps with a railing? : Total 6 Click Score: 12    End of Session Equipment Utilized During Treatment: Gait belt Activity Tolerance: Patient tolerated treatment well Patient left: in chair(with Speech , to go for study) Nurse Communication: Mobility status PT Visit Diagnosis: Unsteadiness on feet (R26.81)     Time: 0315-9458 PT Time Calculation (min) (ACUTE ONLY): 25 min  Charges:  $Therapeutic Activity: 23-37 mins                     Blanchard Kelch PT Acute Rehabilitation Services Pager [phone] Office [phone]    Rada Hay 04/09/2019, 4:41 PM

## 2019-04-09 NOTE — Progress Notes (Signed)
Update family Benjamin Cohen) on pt fall, current condition, and plan of care.

## 2019-04-09 NOTE — Progress Notes (Signed)
RORIK VESPA  TWS:[id number] DOB: 1956/07/29 DOA: 03/11/2019 PCP: Filiberto Pinks    Brief Narrative:  63 y/o M admitted 3/31 with fever, cough, SOB and confusion. He attended a wedding mid March, after which multiple family members were diagnosed with COVID including his wife. He developed worsening hypoxemia w/ respiratory distress 4/1 and PCCM was consulted for evaluation. Intubated 4/2 with ARDS. Extubated 4/12. Had to be reintubated 4/17.  Upon further stability-he was transferred out of the ICU- post extubation course complicated by severe delirium, severe dysphagia requiring NG tube.  Unfortunately, patient has pulled out his NG tube repeatedly-speech therapy following closely to see if diet can be reinitiated-remains n.p.o.  Significant Events: 3/31 Admit, 2L, bilateral patchy infiltrates  4/01 PCCM consulted 4/02 Intubated 4/03 toclizamab x 1 4/05 Net positive, remains on paralytic, still prone 4/06 Supine, plateau pressure 23, 50% / 12 PEEP; complete 5 days of plaquenil 4/08 Plateau 23, PEEP 12 / 40% 4/14 transfer to floor bed 4/17 re-intubated   4/20 extubated 4/22 SLP eval > NPO 4/24 NG tube reinserted and NG feedings started 4/25 to 4/27>> repeatedly has pulled out NG tube-requiring reinsertion  COVID-19 specific Treatment: Actemra x1 Plaquenil 4/1 > 4/6   Ventilator Settings: NA - extubated 4/20  Subjective: Patient awake alert.  Noted to be distracted.  Called by nurse after I had rounded on the patient that he was found to be on the side of his bed on the floor.  No injuries reported.  Vital signs were stable.     Assessment & Plan:  Acute Hypoxic Resp Failure - Covid 19 associated ARDS - MRSA Pneumonia:  Patient is respiratory status remains stable.  He saturating normal on room air.  Patient has completed course of IV vancomycin.  Continue supportive care.    Acute metabolic encephalopathy - ICU delirium:  Remains encephalopathic although improved.   Continue to monitor.  No focal deficits noted.  Falls No injuries noted per nursing staff.  Continue to monitor.  Sitter.  NSVT Telemetry shows concern for NSVT although some of them appears to be artifact.  Patient noted to be tapping on his chest very frequently when I was in his room.  This could be the reason for the artifact.  Dysphagia- post extubation Probably secondary to prolonged intubation, he also has a history of prior CVA.  Patient has pulled out his feeding tube multiple times.  Due to technical issues patient could not undergo modified barium swallow yesterday.  To be reattempted today.     Essential hypertension  Continue amlodipine.  Add beta-blocker.  History of CVA Continue Plavix and statin.  Stable.    Hypokalemia Potassium 3.4.  Magnesium 1.9.  Potassium will be repleted.    DM2 CBGs are reasonably well controlled.  Continue SSI.    Normocytic anemia Secondary to acute illness.  Hemoglobin remained stable.  No evidence of overt bleeding.    Nutrition Will likely need another attempt at feeding tube placement if he does not pass swallow evaluation.    Deconditioning Secondary to acute/critical illness.  Patient being evaluated by [facility] inpatient rehab department.  They will need negative COVID-19 test results before they can take him.  Test was ordered yesterday.  DVT prophylaxis: lovenox  Code Status: FULL CODE Family Communication: Discussed with his ex-wife yesterday. Disposition Plan: Evaluated by CIR.  However they need 2 negative COVID-19 tests.  Consultants:  PCCM  Antimicrobials:  Vanc 4/17 > 4/23  Objective: Blood pressure  135/82, pulse 91, temperature 97.9 F (36.6 C), resp. rate 20, height 5' 7"  (1.702 m), weight 102 kg, SpO2 96 %.  Intake/Output Summary (Last 24 hours) at 04/09/2019 1051 Last data filed at 04/09/2019 0200 Gross per 24 hour  Intake 936.22 ml  Output 550 ml  Net 386.22 ml   Filed Weights   04/05/19 0500  04/07/19 0500 04/08/19 0405  Weight: 103 kg 101.7 kg 102 kg    Examination:  General appearance: Awake alert.  In no distress.  Distracted. Resp: Clear to auscultation bilaterally.  Normal effort Cardio: S1-S2 is normal regular.  No S3-S4.  No rubs murmurs or bruit GI: Abdomen is soft.  Nontender nondistended.  Bowel sounds are present normal.  No masses organomegaly Extremities: No edema.  Full range of motion of lower extremities. Neurologic: Alert.  Oriented to place.  With some encouragement he was able to tell me the year and the month.  Moving all his extremities.  No facial asymmetry.    CBC: Recent Labs  Lab 04/03/19 0335 04/03/19 0435 04/05/19 0400 04/08/19 0400  WBC 5.6  --  6.7 4.8  HGB 10.8* 9.9* 10.1* 10.5*  HCT 34.8* 29.0* 33.4* 33.6*  MCV 86.4  --  86.8 85.7  PLT 242  --  353 592*   Basic Metabolic Panel: Recent Labs  Lab 04/03/19 0335  04/07/19 0540 04/08/19 0400 04/09/19 0515  NA 141   < > 138 138 139  K 3.1*   < > 4.1 3.4* 3.4*  CL 108   < > 105 105 105  CO2 22   < > 24 24 24   GLUCOSE 164*   < > 274* 126* 138*  BUN 12   < > 6* 7* 6*  CREATININE 0.77   < > 0.80 0.79 0.79  CALCIUM 8.1*   < > 8.5* 8.5* 8.6*  MG 2.1  --   --   --  1.9   < > = values in this interval not displayed.   GFR: Estimated Creatinine Clearance: 104.8 mL/min (by C-G formula based on SCr of 0.79 mg/dL).  Liver Function Tests: Recent Labs  Lab 04/03/19 0335  AST 24  ALT 28  ALKPHOS 93  BILITOT 0.9  PROT 6.9  ALBUMIN 2.7*   HbA1C: Hgb A1c MFr Bld  Date/Time Value Ref Range Status  03/11/2019 03:38 PM 12.4 (H) 4.8 - 5.6 % Final    Comment:    (NOTE) Pre diabetes:          5.7%-6.4% Diabetes:              >6.4% Glycemic control for   <7.0% adults with diabetes   04/30/2018 01:28 AM 11.5 (H) 4.8 - 5.6 % Final    Comment:    (NOTE) Pre diabetes:          5.7%-6.4% Diabetes:              >6.4% Glycemic control for   <7.0% adults with diabetes     CBG:  Recent Labs  Lab 04/08/19 1634 04/08/19 1946 04/09/19 0005 04/09/19 0348 04/09/19 0836  GLUCAP 134* 120* 133* 119* 149*    Recent Results (from the past 240 hour(s))  Novel Coronavirus, NAA (hospital order; send-out to ref lab)     Status: None   Collection Time: 04/08/19  2:10 PM  Result Value Ref Range Status   SARS-CoV-2, NAA NOT DETECTED NOT DETECTED Final    Comment: (NOTE) This test was developed and its performance  characteristics determined by [employer]. This test has not been FDA cleared or approved. This test has been authorized by FDA under an Emergency Use Authorization (EUA). This test has been validated in accordance with the FDA's Guidance Document (Policy for [place] in Laboratories Certified to Perform High Complexity Testing under CLIA prior to Emergency Use Authorization for Coronavirus [id number] during the [facility] Emergency) issued on February 29th, 2020. FDA independent review of this validation is pending. This test is only authorized for the duration of time the declaration that circumstances exist justifying the authorization of the emergency use of in vitro diagnostic tests for detection of SARS-CoV- 2 virus and/or diagnosis of COVID-19 infection under section 564(b)(1) of the Act, 21 U.S.C. 037CWU-8(Q)(9), unless the authorization is terminated or revoked sooner. Performed At: [facility] [place], [place] [id number] Rush Farmer MD EK:[id number]    [place]  Final    Comment: Performed at [facility] [address], [place], [place] [address]     Scheduled Meds: . amLODipine  5 mg Per Tube Daily  . atorvastatin  40 mg Per Tube q1800  . chlorhexidine gluconate (MEDLINE KIT)  15 mL Mouth Rinse BID  . clopidogrel  75 mg Per Tube Daily  . enoxaparin (LOVENOX) injection  40 mg Subcutaneous Q24H  . insulin aspart  0-15 Units Subcutaneous Q4H  .  lidocaine  1 patch Transdermal Q24H  . mouth rinse  15 mL Mouth Rinse BID  . metoprolol tartrate  25 mg Oral BID  . sodium chloride flush  10-40 mL Intracatheter Q12H   Continuous Infusions: . sodium chloride 250 mL (04/09/19 0852)  . dextrose 5 % and 0.45 % NaCl with KCl 20 mEq/L 60 mL/hr at 04/08/19 1739  . feeding supplement (OSMOLITE 1.5 CAL) 20 mL/hr at 04/06/19 1900  . potassium chloride 10 mEq (04/09/19 1028)     LOS: 29 days   [facility] Office  [phone] Pager - Text Page per Shea Hout  If 7PM-7AM, please contact night-coverage per Amion 04/09/2019, 10:51 AM

## 2019-04-09 NOTE — Progress Notes (Signed)
Inpatient Rehabilitation-Admissions Coordinator   [facility] has received insurance approval for admit to CIR. AC waiting on 2nd COVID-19 test to be completed. If negative and pt remains fever free, we may be able to admit Thursday vs Friday depending on bed availability.   Please call if questions.   Nanine Means, OTR/L  Rehab Admissions Coordinator  [phone] 04/09/2019 4:55 PM

## 2019-04-09 NOTE — Progress Notes (Signed)
Nutrition Follow-up RD working remotely.  DOCUMENTATION CODES:   Obesity unspecified  INTERVENTION:    Monitor for ability to begin PO diet pending results of MBS today.   RD to add appropriate supplements when diet advanced.  NUTRITION DIAGNOSIS:   Inadequate oral intake related to inability to eat as evidenced by NPO status.  Ongoing   GOAL:   Patient will meet greater than or equal to 90% of their needs  Unmet  MONITOR:   Diet advancement, PO intake, Skin, Labs  ASSESSMENT:   63 year-old male admitted 3/31 with reports of fever, cough, SOB and confusion.  He was recently at a wedding mid-March and after multiple family members were diagnosed with COVID-19. Patient had been found on the floor and unable to get for several days. He developed worsening hypoxemia, respiratory distress 4/1 and PCCM consulted for evaluation. CXR showed patchy bilateral infiltrates. He was screened for COVID in the ED on 3/31 and treated empirically for CAP.  NG tube removed last week by patient. Patient remains NPO. He is receiving meds in puree and ice chips. Plans for MBS today with SLP. Hopeful to begin POs today.    Labs reviewed. Potassium 3.4 (L), BUN 6 (L) CBG's: [phone]  Medications reviewed and include Novolog. 4 KCl runs ordered today.  IVF: D5 1/2NS with 20 mEq KCl/L at 60 ml/h  Diet Order:   Diet Order            Diet NPO time specified Except for: Ice Chips  Diet effective now              EDUCATION NEEDS:   Not appropriate for education at this time  Skin:  Skin Assessment: Skin Integrity Issues: Skin Integrity Issues:: DTI, Other (Comment) DTI: buttocks, heel Stage II: R face (new 4/12) Other: skin tears, MASD  Last BM:  4/25 (type 7)  Height:   Ht Readings from Last 1 Encounters:  03/19/19 5\' 7"  (1.702 m)    Weight:   Wt Readings from Last 1 Encounters:  04/08/19 102 kg    Ideal Body Weight:  67.27 kg  BMI:  Body mass index is 35.22  kg/m.  Estimated Nutritional Needs:   Kcal:  2100-2300  Protein:  120-143 g  Fluid:  >/= 2.2 L/day    Joaquin Courts, RD, LDN, CNSC Pager [phone] After Hours Pager [phone]

## 2019-04-09 NOTE — Progress Notes (Signed)
Occupational Therapy Treatment Patient Details Name: Benjamin Cohen MRN: [id number] DOB: [birth year] Today's Date: 04/09/2019    History of present illness 63 y.o. male admitted for acute respiratory failure (+) COVID 19, s/p intubation 4/2-4/11, re-intubated 4/17-4/20/20.  Pt with significant PMH of CVA and HTN.     OT comments  Pt progressing well towards established OT goals. Pt highly motivated to participate in therapy and enjoyed listening to music during session. Pt performing functional mobility to the sink with Min A +2 and RW. Pt standing at sink to perform oral care and wash his face. Pt tolerating standing for ~15 minutes with Min-Mod A for balance; pt with heavy reliance on UE for support sink. Pt continues to present with decreased cognition and requires significant time for processing and increased cues during ADLs. Continue to recommend dc to CIR and will continue to follow acutely as admitted.    Follow Up Recommendations  CIR;Supervision/Assistance - 24 hour    Equipment Recommendations  3 in 1 bedside commode    Recommendations for Other Services Rehab consult    Precautions / Restrictions Precautions Precautions: Fall Precaution Comments: High risk for falls. Fallen out of recliner Restrictions Weight Bearing Restrictions: No       Mobility Bed Mobility Overal bed mobility: Needs Assistance Bed Mobility: Sit to Sidelying;Rolling;Sidelying to Sit Rolling: Supervision Sidelying to sit: Min assist   Sit to sidelying: Min guard General bed mobility comments: Pt able roll to left side and then push into sitting. Requiring Min A to push against OT and gain full upright posture at EOB. Pt requiring Min Guard A for safety in returning to supine.   Transfers Overall transfer level: Needs assistance Equipment used: Rolling walker (2 wheeled) Transfers: Sit to/from [employer] Sit to Stand: Min assist;+2 safety/equipment       General transfer  comment: Min A to power up from EOB. +2 safety    Balance Overall balance assessment: Needs assistance Sitting-balance support: Feet supported;Bilateral upper extremity supported Sitting balance-Leahy Scale: Fair Sitting balance - Comments: able to sit unsupoorted EOB Postural control: Posterior lean Standing balance support: Bilateral upper extremity supported Standing balance-Leahy Scale: Poor Standing balance comment: reliant on external support                           ADL either performed or assessed with clinical judgement   ADL Overall ADL's : Needs assistance/impaired   Grooming: Minimal assistance;Moderate assistance;Wash/dry face;Oral care;Standing Grooming Details (indicate cue type and reason): Pt washing his face and brushing his teeth at the sink with Min-Mod A (and +2 for safety) for standing balance. Pt with heavy reliance on UE at sink for support as well as fatigue. Pt highly mtoivated to complete tasks in standing and maintained standing for ~15 minutes before needing a seated rest break. Pt requiring cues to sequencing during task; such as recalling to place soap on wash clothe after he opened the soap or turning off the water completely.      Lower Body Dressing: Min guard;Sit to/from stand Lower Body Dressing Details (indicate cue type and reason): At EOB, pt pulling up his socks with Min Guard A for safety  Toilet Transfer: Moderate assistance;+2 for physical assistance;+2 for safety/equipment;RW;Ambulation;Minimal assistance(Simulated to the recliner) Toilet Transfer Details (indicate cue type and reason): Min-Mod A for power up into standing depending on fatigue       Functional mobility during ADLs: Minimal assistance;+2 for physical assistance;+2 for  safety/equipment;Rolling walker;Moderate assistance General ADL Comments: Min-Mod A +2 for balance and safety abulate ~10 feet to sink and then sit in recliner once finished with grooming. Pt  tolerating standing at sink for ~ 15 minutes. Pt highly motivated to particiapte in therapy. He also enjoyed listening to one of his favorite artists "Sage". At times, pt becoming distracted by music and requiring quiet environement to follow cues and participate in problem solving     Vision       Perception     Praxis      Cognition Arousal/Alertness: Awake/alert Behavior During Therapy: [facility] for tasks assessed/performed Overall Cognitive Status: Impaired/Different from baseline Area of Impairment: Orientation;Safety/judgement;Awareness;Problem solving;Following commands;Attention;Memory                 Orientation Level: Disoriented to;Time Current Attention Level: Sustained Memory: Decreased recall of precautions;Decreased short-term memory Following Commands: Follows one step commands with increased time;Follows one step commands inconsistently Safety/Judgement: Decreased awareness of safety;Decreased awareness of deficits Awareness: Emergent Problem Solving: Slow processing;Difficulty sequencing;Requires verbal cues General Comments: Pt with increased following commands and attention this session. Pt requiring increased time for processing and can become internally distracted and will required further cues. Pt ask question to why his Left leg feels weak. Pt able to recall he is at the hospital because of COVID, but difficulty relating this to his weakness.         Exercises    Shoulder Instructions       General Comments HR 120s with activity. SpO2 >90% on RA    Pertinent Vitals/ Pain       Pain Assessment: Faces Faces Pain Scale: No hurt  Pain Intervention(s): Monitored during session  Home Living                                          Prior Functioning/Environment              Frequency  Min 3X/week        Progress Toward Goals  OT Goals(current goals can now be found in the care plan section)  Progress towards OT goals:  Progressing toward goals  Acute Rehab OT Goals Patient Stated [facility] Goal Formulation: With patient Time For Goal Achievement: 04/15/19 Potential to Achieve Goals: Good ADL Goals Pt Will Perform Grooming: with modified independence;sitting Pt Will Perform Upper Body Bathing: with set-up;with supervision;sitting Pt Will Perform Lower Body Bathing: with min assist;sit to/from stand Pt Will Transfer to Toilet: bedside commode;with min assist;stand pivot transfer Additional ADL Goal #1: pt will perform UB adls with mod A seated Additional ADL Goal #2: pt will go from sit to stand with mod +2 assist for adls Additional ADL Goal #3: Pt will tolerate A/AAROM to bil UEs to strengthening arms for adls and toilet transfers  Plan Discharge plan remains appropriate    Co-evaluation    PT/OT/SLP Co-Evaluation/Treatment: Yes(with SLP) Reason for Co-Treatment: Necessary to address cognition/behavior during functional activity   OT goals addressed during session: ADL's and self-care SLP goals addressed during session: Communication;Cognition    AM-PAC OT "6 Clicks" Daily Activity     Outcome Measure   Help from another person eating meals?: A Lot Help from another person taking care of personal grooming?: A Little Help from another person toileting, which includes using toliet, bedpan, or urinal?: A Lot Help from  another person bathing (including washing, rinsing, drying)?: A Lot Help from another person to put on and taking off regular upper body clothing?: A Lot Help from another person to put on and taking off regular lower body clothing?: A Lot 6 Click Score: 13    End of Session Equipment Utilized During Treatment: Gait belt;Rolling walker  OT Visit Diagnosis: Other abnormalities of gait and mobility (R26.89);Muscle weakness (generalized) (M62.81);Pain;Other symptoms and signs involving cognitive function Pain - Right/Left: Left Pain - part of body:  Shoulder   Activity Tolerance Patient tolerated treatment well   Patient Left in bed;with call bell/phone within reach;with bed alarm set;with nursing/sitter in room   Nurse Communication Mobility status        Time: 2536-6440 OT Time Calculation (min): 45 min  Charges: OT General Charges $OT Visit: 1 Visit OT Treatments $Self Care/Home Management : 23-37 mins  Andrue Dini MSOT, OTR/L Acute Rehab Pager: [phone] Office: [phone]   Theodoro Grist Anzleigh Slaven 04/09/2019, 5:14 PM

## 2019-04-09 NOTE — Progress Notes (Signed)
Attempted to draw blood of triple lumen and none of the lumens would draw blood. Flushed all the lines several times and attempted to draw out with 3 cc syringe but could only get 1-2 mL. Made lab aware

## 2019-04-09 NOTE — Progress Notes (Signed)
  Speech Language Pathology Treatment: Cognitive-Linquistic  Patient Details Name: Benjamin Cohen MRN: [id number] DOB: 1956-04-27 Today's Date: 04/09/2019 Time: 8502-7741 SLP Time Calculation (min) (ACUTE ONLY): 45 min  Assessment / Plan / Recommendation Clinical Impression  Pt seen this afternoon for skilled co-tx with OT to address cognition and communication within functional contexts. Although pt enjoyed listening to familiar music, he needed Mod cues to select attention to tasks, reduced to Min cues for sustained attention when music was paused. Min-Mod cues provided for awareness and safety awareness within his environment and sequencing during transfers. His communication was more fluent when he was talking about things of personal significance, otherwise it was mostly limited to few-word utterances. Toward the end of the session pt demonstrated intellectual awareness of LLE weakness with Min cues to verbalize and localize what he was feeling. Min cues were also provided to show orientation to situation on a surface level ("corona"). Pt continues to demonstrate progress and will benefit from intensive SLP f/u post-acute setting.    HPI HPI: 63 y/o M admitted 3/31 COVID (+).Developed worsening hypoxemia, respiratory distress 4/1 and PCCM consulted for evaluation. Intubated 4/2 with ARDS.Was extubated 03/23/2019, with no oxygen requirement. Initial clinical swallow evaluation 4/13 revealed concerns for severe dysphagia; NPO was recommended, TF.  Pt developed respiratory distress and was reintubated on 03/28/2019; extubated 4/20. Hx prior strokes.       SLP Plan  Continue with current plan of care;MBS       Recommendations  Compensations: Minimize environmental distractions;Slow rate;Small sips/bites;Multiple dry swallows after each bite/sip                Follow up Recommendations: Inpatient Rehab SLP Visit Diagnosis: Cognitive communication deficit (O87.867) Plan: Continue with  current plan of care;MBS       GO                [name] 04/09/2019, 4:35 PM  Ivar Drape, M.A. CCC-SLP Acute Herbalist [phone] Office [phone]

## 2019-04-10 LAB — BASIC METABOLIC PANEL
Anion gap: 7 (ref 5–15)
BUN: 7 mg/dL — ABNORMAL LOW (ref 8–23)
CO2: 25 mmol/L (ref 22–32)
Calcium: 8.7 mg/dL — ABNORMAL LOW (ref 8.9–10.3)
Chloride: 108 mmol/L (ref 98–111)
Creatinine, Ser: 0.85 mg/dL (ref 0.61–1.24)
GFR calc Af Amer: >60 mL/min (ref >60)
GFR calc non Af Amer: >60 mL/min (ref >60)
Glucose, Bld: 140 mg/dL — ABNORMAL HIGH (ref 70–99)
Potassium: 3.5 mmol/L (ref 3.5–5.1)
Sodium: 140 mmol/L (ref 135–145)

## 2019-04-10 LAB — GLUCOSE, CAPILLARY
Glucose-Capillary: 126 mg/dL — ABNORMAL HIGH (ref 70–99)
Glucose-Capillary: 154 mg/dL — ABNORMAL HIGH (ref 70–99)
Glucose-Capillary: 217 mg/dL — ABNORMAL HIGH (ref 70–99)
Glucose-Capillary: 154 mg/dL — ABNORMAL HIGH (ref 70–99)

## 2019-04-10 LAB — MAGNESIUM: Magnesium: 1.8 mg/dL (ref 1.7–2.4)

## 2019-04-10 MED ORDER — POTASSIUM CHLORIDE 20 MEQ/15ML (10%) PO SOLN
40.0000 meq | Freq: Once | ORAL | Status: AC
  Administered 2019-04-10: 40 meq via ORAL
  Filled 2019-04-10: qty 30

## 2019-04-10 MED ORDER — QUETIAPINE FUMARATE 25 MG PO TABS
25.0000 mg | ORAL_TABLET | Freq: Every day | ORAL | Status: DC
  Administered 2019-04-10: 21:00:00 25 mg via ORAL
  Filled 2019-04-10 (×2): qty 1

## 2019-04-10 MED ORDER — INSULIN ASPART 100 UNIT/ML ~~LOC~~ SOLN
0.0000 [IU] | Freq: Three times a day (TID) | SUBCUTANEOUS | Status: DC
  Administered 2019-04-10: 3 [IU] via SUBCUTANEOUS
  Administered 2019-04-10 – 2019-04-11 (×2): 2 [IU] via SUBCUTANEOUS
  Administered 2019-04-11: 10:00:00 1 [IU] via SUBCUTANEOUS

## 2019-04-10 MED ORDER — POTASSIUM CHLORIDE CRYS ER 20 MEQ PO TBCR
40.0000 meq | EXTENDED_RELEASE_TABLET | Freq: Once | ORAL | Status: DC
  Filled 2019-04-10: qty 2

## 2019-04-10 MED ORDER — MAGNESIUM SULFATE 2 GM/50ML IV SOLN
2.0000 g | Freq: Once | INTRAVENOUS | Status: AC
  Administered 2019-04-10: 2 g via INTRAVENOUS
  Filled 2019-04-10: qty 50

## 2019-04-10 NOTE — Progress Notes (Signed)
Benjamin Cohen  HQI:[id number] DOB: 05/04/1956 DOA: 03/11/2019 PCP: Filiberto Pinks    Brief Narrative:  63 y/o M admitted 3/31 with fever, cough, SOB and confusion. He attended a wedding mid March, after which multiple family members were diagnosed with COVID including his wife. He developed worsening hypoxemia w/ respiratory distress 4/1 and PCCM was consulted for evaluation. Intubated 4/2 with ARDS. Extubated 4/12. Had to be reintubated 4/17.  Upon further stability-he was transferred out of the ICU- post extubation course complicated by severe delirium, severe dysphagia requiring NG tube.  Unfortunately, patient has pulled out his NG tube repeatedly-speech therapy following closely to see if diet can be reinitiated-remains n.p.o.  Significant Events: 3/31 Admit, 2L, bilateral patchy infiltrates  4/01 PCCM consulted 4/02 Intubated 4/03 toclizamab x 1 4/05 Net positive, remains on paralytic, still prone 4/06 Supine, plateau pressure 23, 50% / 12 PEEP; complete 5 days of plaquenil 4/08 Plateau 23, PEEP 12 / 40% 4/14 transfer to floor bed 4/17 re-intubated   4/20 extubated 4/22 SLP eval > NPO 4/24 NG tube reinserted and NG feedings started 4/25 to 4/27>> repeatedly has pulled out NG tube-requiring reinsertion  COVID-19 specific Treatment: Actemra x1 Plaquenil 4/1 > 4/6   Ventilator Settings: NA - extubated 4/20  Subjective: Patient awake alert.  According to the sitter patient did not sleep overnight.  Mildly agitated.  Eating breakfast this morning.  Following commands.  Seems to be cooperative.  Denies any complaints.     Assessment & Plan:  Acute Hypoxic Resp Failure - Covid 19 associated ARDS - MRSA Pneumonia:  Respiratory status remained stable.  He is saturating normal on room air.  Patient has completed a course of IV vancomycin.  Continue supportive care.  Incentive spirometry.     Acute metabolic encephalopathy - ICU delirium:  Still somewhat distracted.   Apparently did not sleep last night.  May benefit from low-dose Seroquel, which has been ordered.  QTC has been normal.    Fall on 4/29 No injuries noted.  Continue to monitor.  Patient moving all his extremities.    Questionable NSVT Telemetry showed concern for NSVT although some of them appears to be artifact.  Patient noted to be tapping on his chest very frequently when I was in his room.  This could be the reason for the artifact.  Continue low-dose beta-blocker.  Dysphagia- post extubation Probably secondary to prolonged intubation, he also has a history of prior CVA.  Patient underwent modified barium swallow yesterday and was cleared for dysphagia to diet which he seems to be tolerating well.  Continue to monitor.  Stop IV fluids.  Change CBGs to before meals and at bedtime.     Essential hypertension  Blood pressure reasonably well controlled on amlodipine and metoprolol.    History of CVA Continue Plavix and statin.  Stable.    Hypokalemia Potassium 3.5 today.  Magnesium 1.8.  We will give him doses of same.    DM2 CBGs are reasonably well controlled.  Continue SSI.  Change to [facility] at bedtime.  Stopped IV fluids.  HbA1c 12.4.  Patient was on glimepiride and metformin at home.  Normocytic anemia Secondary to acute illness.  Hemoglobin has remained stable.  No evidence of overt bleeding.    Nutrition Patient cleared for oral intake yesterday.  He seems to be tolerating the dysphagia 2 diet well.     Deconditioning Secondary to acute/critical illness.  Patient being evaluated by [facility] inpatient rehab department.  They will need 2 negative COVID-19 test results before they can take him.  First test came back negative.  The second test is pending.  DVT prophylaxis: lovenox  Code Status: FULL CODE Family Communication: Care being discussed with his ex-wife on a daily basis Disposition Plan: Evaluated by CIR.  However they need 2 negative COVID-19 tests.  Waiting on the  second negative test.  Consultants:  PCCM  Antimicrobials:  Vanc 4/17 > 4/23  Objective: Blood pressure (!) 144/88, pulse 79, temperature (!) 96.1 F (35.6 C), temperature source Axillary, resp. rate 19, height _0  (1.702 m), weight 101.3 kg, SpO2 93 %.  Intake/Output Summary (Last 24 hours) at 04/10/2019 0929 Last data filed at 04/10/2019 0818 Gross per 24 hour  Intake 1025.85 ml  Output 200 ml  Net 825.85 ml   Filed Weights   04/07/19 0500 04/08/19 0405 04/10/19 0351  Weight: 101.7 kg 102 kg 101.3 kg    Examination:  General appearance: Awake alert.  In no distress.  Distracted.  Following commands. Resp: Clear to auscultation bilaterally.  Normal effort Cardio: S1-S2 is normal regular.  No S3-S4.  No rubs murmurs or bruit GI: Abdomen is soft.  Nontender nondistended.  Bowel sounds are present normal.  No masses organomegaly Extremities: No edema.  Full range of motion of lower extremities. Neurologic: Alert.  Has been oriented to place year month.  No facial asymmetry.  Moving all his extremities.     CBC: Recent Labs  Lab 04/05/19 0400 04/08/19 0400  WBC 6.7 4.8  HGB 10.1* 10.5*  HCT 33.4* 33.6*  MCV 86.8 85.7  PLT 353 568*   Basic Metabolic Panel: Recent Labs  Lab 04/08/19 0400 04/09/19 0515 04/10/19 0532  NA 138 139 140  K 3.4* 3.4* 3.5  CL 105 105 108  CO2 _1 GLUCOSE 126* 138* 140*  BUN 7* 6* 7*  CREATININE 0.79 0.79 0.85  CALCIUM 8.5* 8.6* 8.7*  MG  --  1.9 1.8   GFR: Estimated Creatinine Clearance: 98.3 mL/min (by C-G formula based on SCr of 0.85 mg/dL).  HbA1C: Hgb A1c MFr Bld  Date/Time Value Ref Range Status  03/11/2019 03:38 PM 12.4 (H) 4.8 - 5.6 % Final    Comment:    (NOTE) Pre diabetes:          5.7%-6.4% Diabetes:              >6.4% Glycemic control for   <7.0% adults with diabetes   04/30/2018 01:28 AM 11.5 (H) 4.8 - 5.6 % Final    Comment:    (NOTE) Pre diabetes:          5.7%-6.4% Diabetes:              >6.4%  Glycemic control for   <7.0% adults with diabetes     CBG: Recent Labs  Lab 04/09/19 1650 04/09/19 1949 04/09/19 2316 04/10/19 0331 04/10/19 0817  GLUCAP 142* 141* 143* 126* 154*    Recent Results (from the past 240 hour(s))  Novel Coronavirus, NAA (hospital order; send-out to ref lab)     Status: None   Collection Time: 04/08/19  2:10 PM  Result Value Ref Range Status   SARS-CoV-2, NAA NOT DETECTED NOT DETECTED Final    Comment: (NOTE) This test was developed and its performance characteristics determined by [employer]. This test has not been FDA cleared or approved. This test has been authorized by FDA under an Emergency Use Authorization (EUA). This test has  been validated in accordance with the FDA's Guidance Document (Policy for Diagnostics Testing in Laboratories Certified to Perform High Complexity Testing under CLIA prior to Emergency Use Authorization for Coronavirus GBEEFEO-7121 during the [facility]) issued on February 29th, 2020. FDA independent review of this validation is pending. This test is only authorized for the duration of time the declaration that circumstances exist justifying the authorization of the emergency use of in vitro diagnostic tests for detection of SARS-CoV- 2 virus and/or diagnosis of COVID-19 infection under section 564(b)(1) of the Act, 21 U.S.C. 975OIT-2(P)(4), unless the authorization is terminated or revoked sooner. Performed At: [facility] [place], [place] [id number] Rush Farmer MD EN:[id number]    [place]  Final    Comment: Performed at [facility] [address], [place], [place] [address]     Scheduled Meds: . amLODipine  5 mg Per Tube Daily  . atorvastatin  40 mg Per Tube q1800  . chlorhexidine gluconate (MEDLINE KIT)  15 mL Mouth Rinse BID  . clopidogrel  75 mg Per Tube Daily  . enoxaparin (LOVENOX) injection  40 mg  Subcutaneous Q24H  . insulin aspart  0-9 Units Subcutaneous TID WC  . lidocaine  1 patch Transdermal Q24H  . mouth rinse  15 mL Mouth Rinse BID  . metoprolol tartrate  25 mg Oral BID  . QUEtiapine  25 mg Oral QHS  . sodium chloride flush  10-40 mL Intracatheter Q12H   Continuous Infusions: . sodium chloride Stopped (04/09/19 1414)  . feeding supplement (OSMOLITE 1.5 CAL) 20 mL/hr at 04/06/19 1900     LOS: 30 days   [facility] Office  [phone] Pager - Text Page per Shea Staniszewski  If 7PM-7AM, please contact night-coverage per Amion 04/10/2019, 9:29 AM

## 2019-04-10 NOTE — Progress Notes (Signed)
Occupational Therapy Treatment Patient Details Name: Benjamin Cohen MRN: [id number] DOB: [birth year] Today's Date: 04/10/2019    History of present illness 63 y.o. male admitted for acute respiratory failure (+) COVID 19, s/p intubation 4/2-4/11, re-intubated 4/17-4/20/20.  Pt with significant PMH of CVA and HTN.     OT comments  Pt continues to make progress with OT, ambulating to the bathroom with +2 min A for safety. After toileting, pt completed grooming task at sink with mod cues for upright posture. VSS. L shoulder taped using Kinesiotape to improve positioning. Recommend trying topical pain patch anterior aspect of shoulder due to L shoulder pian. Continue ot recommend CIR for rehab.   Follow Up Recommendations  CIR;Supervision/Assistance - 24 hour    Equipment Recommendations  3 in 1 bedside commode    Recommendations for Other Services Rehab consult    Precautions / Restrictions Precautions Precautions: Fall       Mobility Bed Mobility Overal bed mobility: Needs Assistance Bed Mobility: Supine to Sit     Supine to sit: Min guard;HOB elevated        Transfers Overall transfer level: Needs assistance Equipment used: Rolling walker (2 wheeled) Transfers: Sit to/from Stand Sit to Stand: Min assist;+2 safety/equipment         General transfer comment: kyphotic; ableto achieve upright posture, but unable to sustain do to poor attention    Balance Overall balance assessment: Needs assistance   Sitting balance-Leahy Scale: Fair       Standing balance-Leahy Scale: Poor Standing balance comment: reliant on external support                           ADL either performed or assessed with clinical judgement   ADL Overall ADL's : Needs assistance/impaired     Grooming: Minimal assistance;Standing Grooming Details (indicate cue type and reason): standing at sink to brush teeth; cues to turn off water; sequenced task without cues     Lower Body  Bathing: Moderate assistance;Sit to/from stand       Lower Body Dressing: Minimal assistance;Sit to/from stand(for socks) Lower Body Dressing Details (indicate cue type and reason): Sitting EOB to donn socks; does better at bed level Toilet Transfer: Moderate assistance;RW(ambulating) Toilet Transfer Details (indicate cue type and reason): Ambulated to toilet; unsafely trying to sit before completly reaching toilet; feel fatigue factoring  in performance Toileting- Clothing Manipulation and Hygiene: Moderate assistance       Functional mobility during ADLs: Rolling walker;+2 for physical assistance;Minimal assistance       Vision       Perception     Praxis      Cognition Arousal/Alertness: Awake/alert Behavior During Therapy: Flat affect;Restless;Impulsive Overall Cognitive Status: Impaired/Different from baseline Area of Impairment: Orientation;Attention;Memory;Following commands;Safety/judgement;Awareness;Problem solving                 Orientation Level: Disoriented to;Time Current Attention Level: Sustained Memory: Decreased recall of precautions;Decreased short-term memory Following Commands: Follows one step commands consistently Safety/Judgement: Decreased awareness of safety;Decreased awareness of deficits Awareness: Emergent Problem Solving: Slow processing;Difficulty sequencing;Decreased initiation;Requires verbal cues;Requires tactile cues General Comments: Continues to improve, however, attention limiting performance adn requires multiple redirectional cues in minimally distracting environemtn        Exercises Other Exercises Other Exercises: L shoulder taped with kinesiotape to attempt to reduce pain; Ice to L shoulder Other Exercises: FF to 90 after gentle distraction   Shoulder Instructions  General Comments      Pertinent Vitals/ Pain       Pain Assessment: Faces Faces Pain Scale: Hurts little more Pain Location: L shoulder pain Pain  Descriptors / Indicators: Discomfort;Grimacing;Guarding Pain Intervention(s): Limited activity within patient's tolerance;Ice applied;Repositioned  Home Living                                          Prior Functioning/Environment              Frequency  Min 3X/week        Progress Toward Goals  OT Goals(current goals can now be found in the care plan section)  Progress towards OT goals: Progressing toward goals  Acute Rehab OT Goals Patient Stated Goal[facility] OT Goal Formulation: With patient Time For Goal Achievement: 04/15/19 Potential to Achieve Goals: Good ADL Goals Pt Will Perform Grooming: with modified independence;sitting Pt Will Perform Upper Body Bathing: with set-up;with supervision;sitting Pt Will Perform Lower Body Bathing: with min assist;sit to/from stand Pt Will Transfer to Toilet: bedside commode;with min assist;stand pivot transfer Additional ADL Goal #1: pt will perform UB adls with mod A seated Additional ADL Goal #2: pt will go from sit to stand with mod +2 assist for adls Additional ADL Goal #3: Pt will tolerate A/AAROM to bil UEs to strengthening arms for adls and toilet transfers  Plan Discharge plan remains appropriate    Co-evaluation    PT/OT/SLP Co-Evaluation/Treatment: Yes Reason for Co-Treatment: Complexity of the patient's impairments (multi-system involvement);For patient/therapist safety;To address functional/ADL transfers   OT goals addressed during session: ADL's and self-care;Strengthening/ROM      AM-PAC OT "6 Clicks" Daily Activity     Outcome Measure   Help from another person eating meals?: A Little Help from another person taking care of personal grooming?: A Little Help from another person toileting, which includes using toliet, bedpan, or urinal?: A Lot Help from another person bathing (including washing, rinsing, drying)?: A Lot Help from another person to put on and taking off  regular upper body clothing?: A Lot Help from another person to put on and taking off regular lower body clothing?: A Lot 6 Click Score: 14    End of Session Equipment Utilized During Treatment: Gait belt;Rolling walker  OT Visit Diagnosis: Unsteadiness on feet (R26.81);Other abnormalities of gait and mobility (R26.89);Muscle weakness (generalized) (M62.81);History of falling (Z91.81);Other symptoms and signs involving cognitive function;Pain Pain - Right/Left: Left Pain - part of body: Shoulder   Activity Tolerance Patient tolerated treatment well   Patient Left in chair;with call bell/phone within reach;with nursing/sitter in room   Nurse Communication Mobility status        Time: [id number] OT Time Calculation (min): 61 min  Charges: OT General Charges $OT Visit: 1 Visit OT Treatments $Self Care/Home Management : 23-37 mins  Luisa DagoHilary Mischele Detter, OT/L   Acute OT Clinical Specialist Acute Rehabilitation Services Pager [phone] Office [phone]    [facility],HILLARY 04/10/2019, 1:40 PM

## 2019-04-10 NOTE — Progress Notes (Addendum)
Nutrition Follow-up RD working remotely.  DOCUMENTATION CODES:   Obesity unspecified  INTERVENTION:   Continue Dysphagia 2 diet with nectar thick liquids per SLP  Magic cup BID with meals, each supplement provides 290 kcal and 9 grams of protein  Vital Cuisine Shake with breakfast daily, each supplement provides 520 kcal and 22 grams of protein   NUTRITION DIAGNOSIS:   Inadequate oral intake related to inability to eat as evidenced by NPO status.  Ongoing, diet just advanced  GOAL:   Patient will meet greater than or equal to 90% of their needs  Progressing  MONITOR:   Diet advancement, PO intake, Skin, Labs  ASSESSMENT:   63 year-old male admitted 3/31 with reports of fever, cough, SOB and confusion.  He was recently at a wedding mid-March and after multiple family members were diagnosed with COVID-19. Patient had been found on the floor and unable to get for several days. He developed worsening hypoxemia, respiratory distress 4/1 and PCCM consulted for evaluation. CXR showed patchy bilateral infiltrates. He was screened for COVID in the ED on 3/31 and treated empirically for CAP.  Diet was advanced to dysphagia 2 with nectar thick liquids yesterday afternoon. Patient consumed 90% of dinner last night and 100% of breakfast this morning.   Labs and medications reviewed.  CBG's: 126-154  COVID test negative on 4/28. Second test collected this morning-result pending.   Diet Order:   Diet Order            DIET DYS 2 Room service appropriate? No; Fluid consistency: Nectar Thick  Diet effective now              EDUCATION NEEDS:   Not appropriate for education at this time  Skin:  Skin Assessment: Skin Integrity Issues: Skin Integrity Issues:: DTI, Other (Comment) DTI: buttocks, heel Stage II: R face (new 4/12) Other: skin tears, MASD  Last BM:  4/29  Height:   Ht Readings from Last 1 Encounters:  03/19/19 5\' 7"  (1.702 m)    Weight:   Wt Readings  from Last 1 Encounters:  04/10/19 101.3 kg    Ideal Body Weight:  67.27 kg  BMI:  Body mass index is 34.98 kg/m.  Estimated Nutritional Needs:   Kcal:  2100-2300  Protein:  120-143 g  Fluid:  >/= 2.2 L/day    Joaquin Courts, RD, LDN, CNSC Pager [phone] After Hours Pager [phone]

## 2019-04-10 NOTE — Progress Notes (Signed)
Physical Therapy Treatment Patient Details Name: Geri Seminoledward W Stmartin MRN: [id number] DOB: 10-22-1956 Today's Date: 04/10/2019    History of Present Illness 63 y.o. male admitted for acute respiratory failure (+) COVID 19, s/p intubation 4/2-4/11, re-intubated 4/17-4/20/20.  Pt with significant PMH of CVA and HTN.      PT Comments    Mr. Renae Fickled. Is progressing well. Ambulated  20' x 2 with RW, +2 for safety. Pati3nt continues  To be sluggish and slow with responses. Improved  LLE function, advancing the leg, stood and performed leg swings on each leg while standing and holding RW. Continue  Mobility/gait.   Follow Up Recommendations  CIR     Equipment Recommendations  Rolling walker with 5" wheels;3in1 (PT);Wheelchair (measurements PT);Wheelchair cushion (measurements PT)    Recommendations for Other Services Rehab consult     Precautions / Restrictions Precautions Precautions: Fall Precaution Comments: High risk for falls.has a Designer, industrial/productsafety sitter    Mobility  Bed Mobility Overal bed mobility: Needs Assistance Bed Mobility: Supine to Sit     Supine to sit: Min guard;HOB elevated     General bed mobility comments: sitting on bed edge w/OT  Transfers Overall transfer level: Needs assistance Equipment used: Rolling walker (2 wheeled) Transfers: Sit to/from Stand Sit to Stand: Min assist;+2 safety/equipment         General transfer comment: trunk forward but will stand erect with multimodal cues.; able to achieve upright posture frequent cues for posture. but unable to sustain do to poor attention  Ambulation/Gait Ambulation/Gait assistance: Mod assist;+2 safety/equipment Gait Distance (Feet): 20 Feet(x 2)   Gait Pattern/deviations: Step-through pattern;Decreased step length - right;Decreased step length - left;Shuffle;Trunk flexed Gait velocity: decreased   General Gait Details: multimodal cues for posture, improved advancement/swing  of the left leg/ patient requires cues to  turn and back up fully to surface. All aspectss of mobility are sluggish.   Stairs             Wheelchair Mobility    Modified Rankin (Stroke Patients Only)       Balance Overall balance assessment: Needs assistance Sitting-balance support: Feet supported;Bilateral upper extremity supported Sitting balance-Leahy Scale: Fair Sitting balance - Comments: able to sit unsupoorted EOB   Standing balance support: Bilateral upper extremity supported Standing balance-Leahy Scale: Poor Standing balance comment: reliant on external support                            Cognition Arousal/Alertness: Awake/alert Behavior During Therapy: Flat affect;Restless;Impulsive Overall Cognitive Status: Impaired/Different from baseline Area of Impairment: Orientation;Attention;Memory;Following commands;Safety/judgement;Awareness;Problem solving                 Orientation Level: Disoriented to;Time Current Attention Level: Sustained Memory: Decreased recall of precautions;Decreased short-term memory Following Commands: Follows one step commands consistently Safety/Judgement: Decreased awareness of safety;Decreased awareness of deficits Awareness: Emergent Problem Solving: Slow processing;Difficulty sequencing;Decreased initiation;Requires verbal cues;Requires tactile cues General Comments: Continues to improve, however, attention limiting performance adn requires multiple redirectional cues in minimally distracting environement      Exercises    General Comments        Pertinent Vitals/Pain Pain Assessment: Faces Faces Pain Scale: Hurts little more Pain Location: L shoulder pain Pain Descriptors / Indicators: Discomfort;Grimacing;Guarding Pain Intervention(s): Monitored during session;Repositioned    Home Living                      Prior Function  PT Goals (current goals can now be found in the care plan section) Acute Rehab PT Goals Patient  Stated Goal: to go to [facility] with his x-wife Progress towards PT goals: Progressing toward goals    Frequency    Min 4X/week      PT Plan Current plan remains appropriate    Co-evaluation PT/OT/SLP Co-Evaluation/Treatment: Yes Reason for Co-Treatment: For patient/therapist safety PT goals addressed during session: Mobility/safety with mobility OT goals addressed during session: ADL's and self-care      AM-PAC PT "6 Clicks" Mobility   Outcome Measure  Help needed turning from your back to your side while in a flat bed without using bedrails?: A Little Help needed moving from lying on your back to sitting on the side of a flat bed without using bedrails?: A Little Help needed moving to and from a bed to a chair (including a wheelchair)?: A Lot Help needed standing up from a chair using your arms (e.g., wheelchair or bedside chair)?: A Lot Help needed to walk in hospital room?: Total Help needed climbing 3-5 steps with a railing? : Total 6 Click Score: 12    End of Session Equipment Utilized During Treatment: Gait belt Activity Tolerance: Patient tolerated treatment well Patient left: in chair;with call bell/phone within reach;with nursing/sitter in room Nurse Communication: Mobility status PT Visit Diagnosis: Unsteadiness on feet (R26.81)     Time: 1000-1040 PT Time Calculation (min) (ACUTE ONLY): 40 min  Charges:  $Gait Training: 23-37 mins                     Blanchard Kelch PT Acute Rehabilitation Services Pager [phone] Office [phone]     Rada Hay 04/10/2019, 2:53 PM

## 2019-04-10 NOTE — Progress Notes (Signed)
  Speech Language Pathology Treatment: Dysphagia;Cognitive-Linquistic  Patient Details Name: Benjamin Cohen MRN: [id number] DOB: [birth year] Today's Date: 04/10/2019 Time: 1415-1500 SLP Time Calculation (min) (ACUTE ONLY): 45 min  Assessment / Plan / Recommendation Clinical Impression  Pt continues to make excellent gains.  Assisted to sit EOB.  Pt demonstrated orientation to elements of time, place, and basics of situation.  He wrote name and drew clock face with intermittent verbal cues needed to follow instructions for size.  Number placement was functional; pt had difficulty recalling time to set clock hands, resulting in several attempts with no requests for clarification.  He continues to require cueing to pause and inhibit impulsive actions.  Safety awareness remains impaired. Pt able to recall items from breakfast and lunch trays. He requires consistent verbal cues to increase vocal intensity in order to be understood - otherwise he tends to default to pointing or gesturing, using 1-2 word utterances.  He consumed nectar thick liquids and soft solids with min verbal cues to slow rate; occasional strong cough observed after liquids.  Overall, pt is making daily progress.    HPI HPI: 63 y/o M admitted 3/31 COVID (+).Developed worsening hypoxemia, respiratory distress 4/1 and PCCM consulted for evaluation. Intubated 4/2 with ARDS.Was extubated 03/23/2019, with no oxygen requirement. Initial clinical swallow evaluation 4/13 revealed concerns for severe dysphagia; NPO was recommended, TF.  Pt developed respiratory distress and was reintubated on 03/28/2019; extubated 4/20. Hx prior strokes.       SLP Plan  Continue with current plan of care       Recommendations  Diet recommendations: Dysphagia 2 (fine chop);Nectar-thick liquid Liquids provided via: Cup Medication Administration: Whole meds with puree(crush if large) Supervision: Patient able to self feed;Full supervision/cueing for  compensatory strategies Compensations: Minimize environmental distractions;Slow rate;Small sips/bites;Multiple dry swallows after each bite/sip                Oral Care Recommendations: Oral care BID Follow up Recommendations: Inpatient Rehab Plan: Continue with current plan of care       GO                [name] [name] 04/10/2019, 4:04 PM   Annette Liotta L. [name], MA CCC/SLP Acute Rehabilitation Services Office number [phone] CGV Iphone [phone]

## 2019-04-11 ENCOUNTER — Other Ambulatory Visit: Payer: Self-pay

## 2019-04-11 ENCOUNTER — Encounter (HOSPITAL_COMMUNITY): Payer: Self-pay | Admitting: *Deleted

## 2019-04-11 ENCOUNTER — Inpatient Hospital Stay (HOSPITAL_COMMUNITY)
Admission: RE | Admit: 2019-04-11 | Discharge: 2019-04-26 | DRG: 945 | Disposition: A | Discharge status: Home Health | Payer: 59 | Source: Other Acute Inpatient Hospital | Attending: Physical Medicine & Rehabilitation | Admitting: Physical Medicine & Rehabilitation

## 2019-04-11 DIAGNOSIS — M19012 Primary osteoarthritis, left shoulder: Secondary | ICD-10-CM | POA: Diagnosis present

## 2019-04-11 DIAGNOSIS — R1310 Dysphagia, unspecified: Secondary | ICD-10-CM | POA: Diagnosis not present

## 2019-04-11 DIAGNOSIS — I10 Essential (primary) hypertension: Secondary | ICD-10-CM | POA: Diagnosis present

## 2019-04-11 DIAGNOSIS — R5381 Other malaise: Secondary | ICD-10-CM | POA: Diagnosis present

## 2019-04-11 DIAGNOSIS — Z8249 Family history of ischemic heart disease and other diseases of the circulatory system: Secondary | ICD-10-CM

## 2019-04-11 DIAGNOSIS — R0989 Other specified symptoms and signs involving the circulatory and respiratory systems: Secondary | ICD-10-CM

## 2019-04-11 DIAGNOSIS — Z7902 Long term (current) use of antithrombotics/antiplatelets: Secondary | ICD-10-CM

## 2019-04-11 DIAGNOSIS — Z8619 Personal history of other infectious and parasitic diseases: Secondary | ICD-10-CM | POA: Diagnosis not present

## 2019-04-11 DIAGNOSIS — I69391 Dysphagia following cerebral infarction: Secondary | ICD-10-CM

## 2019-04-11 DIAGNOSIS — G9341 Metabolic encephalopathy: Secondary | ICD-10-CM

## 2019-04-11 DIAGNOSIS — Z8673 Personal history of transient ischemic attack (TIA), and cerebral infarction without residual deficits: Secondary | ICD-10-CM

## 2019-04-11 DIAGNOSIS — Z809 Family history of malignant neoplasm, unspecified: Secondary | ICD-10-CM | POA: Diagnosis not present

## 2019-04-11 DIAGNOSIS — E119 Type 2 diabetes mellitus without complications: Secondary | ICD-10-CM | POA: Diagnosis present

## 2019-04-11 DIAGNOSIS — F05 Delirium due to known physiological condition: Secondary | ICD-10-CM

## 2019-04-11 DIAGNOSIS — Z9119 Patient's noncompliance with other medical treatment and regimen: Secondary | ICD-10-CM

## 2019-04-11 DIAGNOSIS — R7309 Other abnormal glucose: Secondary | ICD-10-CM

## 2019-04-11 DIAGNOSIS — Z7984 Long term (current) use of oral hypoglycemic drugs: Secondary | ICD-10-CM | POA: Diagnosis not present

## 2019-04-11 HISTORY — DX: Type 2 diabetes mellitus without complications: E11.9

## 2019-04-11 LAB — BASIC METABOLIC PANEL
Anion gap: 9 (ref 5–15)
BUN: 10 mg/dL (ref 8–23)
CO2: 23 mmol/L (ref 22–32)
Calcium: 8.2 mg/dL — ABNORMAL LOW (ref 8.9–10.3)
Chloride: 107 mmol/L (ref 98–111)
Creatinine, Ser: 0.78 mg/dL (ref 0.61–1.24)
GFR calc Af Amer: >60 mL/min (ref >60)
GFR calc non Af Amer: >60 mL/min (ref >60)
Glucose, Bld: 157 mg/dL — ABNORMAL HIGH (ref 70–99)
Potassium: 3.6 mmol/L (ref 3.5–5.1)
Sodium: 139 mmol/L (ref 135–145)

## 2019-04-11 LAB — GLUCOSE, CAPILLARY
Glucose-Capillary: 145 mg/dL — ABNORMAL HIGH (ref 70–99)
Glucose-Capillary: 124 mg/dL — ABNORMAL HIGH (ref 70–99)
Glucose-Capillary: 199 mg/dL — ABNORMAL HIGH (ref 70–99)
Glucose-Capillary: 111 mg/dL — ABNORMAL HIGH (ref 70–99)

## 2019-04-11 LAB — NOVEL CORONAVIRUS, NAA (HOSP ORDER, SEND-OUT TO REF LAB; TAT 18-24 HRS): SARS-CoV-2, NAA: NOT DETECTED

## 2019-04-11 MED ORDER — CLOPIDOGREL BISULFATE 75 MG PO TABS
75.0000 mg | ORAL_TABLET | Freq: Every day | ORAL | Status: DC
  Filled 2019-04-11: qty 1

## 2019-04-11 MED ORDER — TRAZODONE HCL 50 MG PO TABS
25.0000 mg | ORAL_TABLET | Freq: Every evening | ORAL | Status: DC | PRN
  Administered 2019-04-11 – 2019-04-25 (×14): 50 mg via ORAL
  Filled 2019-04-11 (×14): qty 1

## 2019-04-11 MED ORDER — METOPROLOL TARTRATE 25 MG PO TABS
25.0000 mg | ORAL_TABLET | Freq: Two times a day (BID) | ORAL | Status: DC
  Administered 2019-04-11 – 2019-04-17 (×12): 25 mg via ORAL
  Filled 2019-04-11 (×12): qty 1

## 2019-04-11 MED ORDER — CHLORHEXIDINE GLUCONATE 0.12% ORAL RINSE (MEDLINE KIT)
15.0000 mL | Freq: Two times a day (BID) | OROMUCOSAL | Status: DC
  Administered 2019-04-11 – 2019-04-21 (×8): 15 mL via OROMUCOSAL

## 2019-04-11 MED ORDER — LIDOCAINE 5 % EX PTCH: 1.0000 | MEDICATED_PATCH | CUTANEOUS | Status: DC

## 2019-04-11 MED ORDER — ATORVASTATIN CALCIUM 40 MG PO TABS: 40.0000 mg | ORAL_TABLET | Freq: Every day | ORAL | Status: DC

## 2019-04-11 MED ORDER — FLEET ENEMA 7-19 GM/118ML RE ENEM: 1.0000 | ENEMA | Freq: Once | RECTAL | Status: DC | PRN

## 2019-04-11 MED ORDER — ORAL CARE MOUTH RINSE
15.0000 mL | Freq: Two times a day (BID) | OROMUCOSAL | Status: DC
  Administered 2019-04-11 – 2019-04-21 (×7): 15 mL via OROMUCOSAL

## 2019-04-11 MED ORDER — PROCHLORPERAZINE MALEATE 5 MG PO TABS: 5.0000 mg | ORAL_TABLET | Freq: Four times a day (QID) | ORAL | Status: DC | PRN

## 2019-04-11 MED ORDER — PROCHLORPERAZINE 25 MG RE SUPP: 12.5000 mg | Freq: Four times a day (QID) | RECTAL | Status: DC | PRN

## 2019-04-11 MED ORDER — CLOPIDOGREL BISULFATE 75 MG PO TABS
75.0000 mg | ORAL_TABLET | Freq: Every day | ORAL | Status: DC
  Administered 2019-04-12 – 2019-04-26 (×15): 75 mg via ORAL
  Filled 2019-04-11 (×15): qty 1

## 2019-04-11 MED ORDER — INSULIN ASPART 100 UNIT/ML ~~LOC~~ SOLN
0.0000 [IU] | Freq: Every day | SUBCUTANEOUS | Status: DC
  Administered 2019-04-22: 3 [IU] via SUBCUTANEOUS

## 2019-04-11 MED ORDER — PROCHLORPERAZINE EDISYLATE 10 MG/2ML IJ SOLN: 5.0000 mg | Freq: Four times a day (QID) | INTRAMUSCULAR | Status: DC | PRN

## 2019-04-11 MED ORDER — ACETAMINOPHEN 325 MG PO TABS
325.0000 mg | ORAL_TABLET | ORAL | Status: DC | PRN
  Administered 2019-04-12 – 2019-04-17 (×3): 650 mg via ORAL
  Administered 2019-04-20: 325 mg via ORAL
  Administered 2019-04-22 – 2019-04-25 (×4): 650 mg via ORAL
  Filled 2019-04-11 (×4): qty 2
  Filled 2019-04-11: qty 1
  Filled 2019-04-11 (×3): qty 2

## 2019-04-11 MED ORDER — RESOURCE THICKENUP CLEAR PO POWD
ORAL | Status: DC | PRN
  Filled 2019-04-11: qty 125

## 2019-04-11 MED ORDER — POLYETHYLENE GLYCOL 3350 17 G PO PACK: 17.0000 g | PACK | Freq: Every day | ORAL | Status: DC | PRN

## 2019-04-11 MED ORDER — INSULIN ASPART 100 UNIT/ML ~~LOC~~ SOLN
0.0000 [IU] | Freq: Three times a day (TID) | SUBCUTANEOUS | Status: DC
  Administered 2019-04-12 (×2): 1 [IU] via SUBCUTANEOUS
  Administered 2019-04-12: 12:00:00 2 [IU] via SUBCUTANEOUS
  Administered 2019-04-13: 3 [IU] via SUBCUTANEOUS
  Administered 2019-04-13: 18:00:00 2 [IU] via SUBCUTANEOUS
  Administered 2019-04-13: 1 [IU] via SUBCUTANEOUS
  Administered 2019-04-14: 13:00:00 2 [IU] via SUBCUTANEOUS
  Administered 2019-04-15: 1 [IU] via SUBCUTANEOUS
  Administered 2019-04-15: 2 [IU] via SUBCUTANEOUS
  Administered 2019-04-16 – 2019-04-17 (×4): 1 [IU] via SUBCUTANEOUS
  Administered 2019-04-18: 2 [IU] via SUBCUTANEOUS
  Administered 2019-04-18 – 2019-04-19 (×4): 1 [IU] via SUBCUTANEOUS
  Administered 2019-04-20: 2 [IU] via SUBCUTANEOUS
  Administered 2019-04-21 – 2019-04-23 (×5): 1 [IU] via SUBCUTANEOUS
  Administered 2019-04-23: 2 [IU] via SUBCUTANEOUS
  Administered 2019-04-23: 1 [IU] via SUBCUTANEOUS
  Administered 2019-04-24: 2 [IU] via SUBCUTANEOUS
  Administered 2019-04-24 – 2019-04-26 (×5): 1 [IU] via SUBCUTANEOUS

## 2019-04-11 MED ORDER — DIPHENHYDRAMINE HCL 12.5 MG/5ML PO ELIX
12.5000 mg | ORAL_SOLUTION | Freq: Four times a day (QID) | ORAL | Status: DC | PRN
  Administered 2019-04-13: 16:00:00 25 mg via ORAL
  Administered 2019-04-14: 12.5 mg via ORAL
  Filled 2019-04-11 (×2): qty 10

## 2019-04-11 MED ORDER — ALUM & MAG HYDROXIDE-SIMETH 200-200-20 MG/5ML PO SUSP: 30.0000 mL | ORAL | Status: DC | PRN

## 2019-04-11 MED ORDER — AMLODIPINE BESYLATE 5 MG PO TABS
5.0000 mg | ORAL_TABLET | Freq: Every day | ORAL | Status: DC
  Administered 2019-04-12 – 2019-04-26 (×15): 5 mg via ORAL
  Filled 2019-04-11 (×15): qty 1

## 2019-04-11 MED ORDER — ENOXAPARIN SODIUM 40 MG/0.4ML ~~LOC~~ SOLN: 40.0000 mg | SUBCUTANEOUS | Status: DC

## 2019-04-11 MED ORDER — ATORVASTATIN CALCIUM 40 MG PO TABS
40.0000 mg | ORAL_TABLET | Freq: Every day | ORAL | Status: DC
  Administered 2019-04-11 – 2019-04-25 (×15): 40 mg via ORAL
  Filled 2019-04-11 (×15): qty 1

## 2019-04-11 MED ORDER — AMLODIPINE BESYLATE 5 MG PO TABS: 5.0000 mg | ORAL_TABLET | Freq: Every day | ORAL | Status: DC

## 2019-04-11 MED ORDER — ACETAMINOPHEN 325 MG PO TABS
650.0000 mg | ORAL_TABLET | Freq: Four times a day (QID) | ORAL | Status: DC | PRN
  Administered 2019-04-11: 14:00:00 650 mg via ORAL
  Filled 2019-04-11: qty 2

## 2019-04-11 MED ORDER — BISACODYL 10 MG RE SUPP: 10.0000 mg | Freq: Every day | RECTAL | Status: DC | PRN

## 2019-04-11 MED ORDER — GUAIFENESIN-DM 100-10 MG/5ML PO SYRP: 5.0000 mL | ORAL_SOLUTION | Freq: Four times a day (QID) | ORAL | Status: DC | PRN

## 2019-04-11 MED ORDER — LIDOCAINE 5 % EX PTCH
1.0000 | MEDICATED_PATCH | CUTANEOUS | Status: DC
  Administered 2019-04-11 – 2019-04-18 (×8): 1 via TRANSDERMAL
  Filled 2019-04-11 (×13): qty 1

## 2019-04-11 MED ORDER — ENOXAPARIN SODIUM 40 MG/0.4ML ~~LOC~~ SOLN
40.0000 mg | SUBCUTANEOUS | Status: DC
  Administered 2019-04-11 – 2019-04-25 (×15): 40 mg via SUBCUTANEOUS
  Filled 2019-04-11 (×15): qty 0.4

## 2019-04-11 MED ORDER — POTASSIUM CHLORIDE 20 MEQ/15ML (10%) PO SOLN
20.0000 meq | Freq: Every day | ORAL | Status: DC
  Administered 2019-04-12 – 2019-04-13 (×2): 20 meq via ORAL
  Filled 2019-04-11 (×3): qty 15

## 2019-04-11 MED ORDER — QUETIAPINE FUMARATE 25 MG PO TABS
25.0000 mg | ORAL_TABLET | Freq: Every day | ORAL | Status: DC
  Administered 2019-04-11 – 2019-04-14 (×4): 25 mg via ORAL
  Filled 2019-04-11 (×4): qty 1

## 2019-04-11 MED ORDER — SODIUM CHLORIDE 0.9% FLUSH: 10.0000 mL | INTRAVENOUS | Status: DC | PRN

## 2019-04-11 MED ORDER — POTASSIUM CHLORIDE 20 MEQ/15ML (10%) PO SOLN
20.0000 meq | Freq: Every day | ORAL | Status: DC
  Administered 2019-04-11: 20 meq via ORAL
  Filled 2019-04-11: qty 15

## 2019-04-11 NOTE — Progress Notes (Signed)
Benjamin Staggers, MD  Physician  Physical Medicine and Rehabilitation  PMR Pre-admission  Signed  Date of Service:  04/05/2019 2:38 PM       Related encounter: ED to Hosp-Admission (Discharged) from 03/11/2019 in [place]      Signed         PMR Admission Coordinator Pre-Admission Assessment  Patient: Benjamin Cohen is an 63 y.o., male MRN: [id number] DOB: 1956-10-12 Height: 5' 7"  (170.2 cm) Weight: 101.3 kg  Insurance Information HMO:     PPO: Yes     PCP:      IPA:      80/20:      OTHER:  PRIMARY: [employer]       Policy#: [id number]      Subscriber: Patient CM Name: Benjamin Cohen      Phone#: [phone]     Fax#: [phone] Pre-Cert#: [id number]      Employer:  Benjamin Cohen provided by Benjamin Cohen for admit to CIR. Pt has 7 days to admit from 4/29. Initial clinicals are due 3 days after admission date to Benjamin Cohen (p): [phone] (f): [phone]; (pt admitted 5/1 with clinical updates due 5/4 due to weekend per [place]).  Benefits:  Phone #: NA     Name: online via [email or web address] Eff. Date: 01/11/19 still active     Deduct: $100 ($100 met)      Out of Pocket Max: $3,000 (met $217.41)      Life Max: NA CIR: $100/day co pay for days 1-5, $0/day co-pay for days 6+      SNF: $100/day co-pay for days 1-5, $0/day co-pay for days 6-60, limited to 60 days/cal year Outpatient: 80%, limited to 60 visits combined PT/OT/ST visits, reviewed for medical necessity after visit 20 is reached     Co-Pay: 20% Home Health: 80%, limited to 120 visits/cal yr      Co-Pay: 20% DME: 80%     Co-Pay: 20% Providers:  SECONDARY: Medicare part A       Policy#: [id number]      Subscriber: Patient CM Name:       Phone#:      Fax#:  Pre-Cert#:       Employer:  Benefits:  Phone #:      Name:  Eff. Date: Part A effective: 01/11/19      Deduct:       Out of Pocket Max:       Life Max:  CIR:       SNF:  Outpatient:      Co-Pay:  Home Health:       Co-Pay:  DME:      Co-Pay:   Medicaid  Application Date:       Case Manager:  Disability Application Date:       Case Worker:   The Data Collection Information Summary for patients in Inpatient Rehabilitation Facilities with attached Privacy Act [place] Records was provided and verbally reviewed with: Family  Emergency Contact Information         Contact Information    Name Relation Home Work [place], [place]   [place], [place] Son   [phone]      Current Medical History  Patient Admitting Diagnosis: Debility due to Covid-19 History of Present Illness: Pt is a 63 yo Male with history of CVA DM2, and HTN. Pt presented on 3/31 with fever, cough, SOB, and AMS after being found lying on the floor for a few  days. Pt had very high suspicion for COVID-19 as he had attended a wedding earlier that month with members who were known to be infected. A CXR showed multifocal opacities and on 4/1 he developed worsening hypoxemia with respiratory distress and on 4/2 required intubation due to ARDS. Pt was initially extubated on 4/12 but again had to be intubated on 4/17. He finally was extubated on 4/20. He required NG tube for feedings due to dysphagia. Pt completed swallow study with recommendations for DYS 2, nectar thick liquids. His course has been complicated due to delirium with pt sustaining a fall on 4/29 with 1:1 sitter ordered. Pt has received two follow up negative tests for Novel Coronavirus, with first one on 4/29 and the second one on 5/1. Pt has been evaluated by therapies with recommendation for CIR due to deconditioning secondary to acute/critial illness. Pt is to be admitted to CIR on 04/11/19.   Patient's medical record from [facility] has been reviewed by the rehabilitation admission coordinator and physician.  Past Medical History      Past Medical History:  Diagnosis Date   Hypertension    Stroke [facility])     Family History   family  history includes Cancer in his mother; Hypertension in his brother.  Prior Rehab/Hospitalizations Has the patient had prior rehab or hospitalizations prior to admission? Yes  Has the patient had major surgery during 100 days prior to admission? No              Current Medications  Current Facility-Administered Medications:    0.9 %  sodium chloride infusion, , Intravenous, PRN, Bonnielee Haff, MD, Stopped at 04/09/19 1414   [DISCONTINUED] acetaminophen (TYLENOL) solution 650 mg, 650 mg, Per Tube, Q6H PRN, 650 mg at 03/30/19 1338 **OR** acetaminophen (TYLENOL) suppository 650 mg, 650 mg, Rectal, Q4H PRN, Wofford, Drew A, RPH   amLODipine (NORVASC) tablet 5 mg, 5 mg, Per Tube, Daily, Ghimire, Henreitta Leber, MD, 5 mg at 04/11/19 1000   atorvastatin (LIPITOR) tablet 40 mg, 40 mg, Per Tube, q1800, Ghimire, Henreitta Leber, MD, 40 mg at 04/10/19 1727   bisacodyl (DULCOLAX) suppository 10 mg, 10 mg, Rectal, Daily PRN, Collene Gobble, MD   chlorhexidine gluconate (MEDLINE KIT) (PERIDEX) 0.12 % solution 15 mL, 15 mL, Mouth Rinse, BID, Sood, Vineet, MD, 15 mL at 04/11/19 1009   clopidogrel (PLAVIX) tablet 75 mg, 75 mg, Per Tube, Daily, Ghimire, Shanker M, MD, 75 mg at 04/11/19 1008   enoxaparin (LOVENOX) injection 40 mg, 40 mg, Subcutaneous, Q24H, Sood, Vineet, MD, 40 mg at 04/10/19 1947   haloperidol lactate (HALDOL) injection 1-2 mg, 1-2 mg, Intravenous, Q6H PRN, Cherene Altes, MD   hydrALAZINE (APRESOLINE) injection 10 mg, 10 mg, Intravenous, Q2H PRN, Joette Catching T, MD, 10 mg at 04/05/19 0833   insulin aspart (novoLOG) injection 0-9 Units, 0-9 Units, Subcutaneous, TID WC, Bonnielee Haff, MD, 1 Units at 04/11/19 1010   lidocaine (LIDODERM) 5 % 1 patch, 1 patch, Transdermal, Q24H, Bonnielee Haff, MD, 1 patch at 04/10/19 1728   MEDLINE mouth rinse, 15 mL, Mouth Rinse, BID, Bonnielee Haff, MD, 15 mL at 04/11/19 1009   metoprolol tartrate (LOPRESSOR) tablet 25 mg, 25 mg, Oral, BID,  Bonnielee Haff, MD, 25 mg at 04/11/19 1009   morphine 2 MG/ML injection 1 mg, 1 mg, Intravenous, Q4H PRN, Jonetta Osgood, MD, 1 mg at 04/11/19 1006   [DISCONTINUED] ondansetron (ZOFRAN) tablet 4 mg, 4 mg, Oral, Q6H PRN **OR** ondansetron (ZOFRAN)  injection 4 mg, 4 mg, Intravenous, Q6H PRN, Halford Chessman, Vineet, MD   potassium chloride 20 MEQ/15ML (10%) solution 20 mEq, 20 mEq, Oral, Daily, Bonnielee Haff, MD, 20 mEq at 04/11/19 1009   QUEtiapine (SEROQUEL) tablet 25 mg, 25 mg, Oral, QHS, Bonnielee Haff, MD, 25 mg at 04/10/19 2055   Resource ThickenUp Clear, , Oral, PRN, Bonnielee Haff, MD   sodium chloride flush (NS) 0.9 % injection 10-40 mL, 10-40 mL, Intracatheter, Q12H, Sood, Vineet, MD, 20 mL at 04/11/19 1009   sodium chloride flush (NS) 0.9 % injection 10-40 mL, 10-40 mL, Intracatheter, PRN, Chesley Mires, MD, 10 mL at 03/27/19 0500  Patients Current Diet:     Diet Order                  DIET DYS 2 Room service appropriate? No; Fluid consistency: Nectar Thick  Diet effective now               Precautions / Restrictions Precautions Precautions: Fall Precaution Comments: High risk for falls.has a Air cabin crew Restrictions Weight Bearing Restrictions: No   Has the patient had 2 or more falls or a fall with injury in the past year? No  Prior Activity Level Community (5-7x/wk): working full-time PTA setting up furniture; drove PTA. lived with 70 yo son in house  Prior Functional Level Self Care: Did the patient need help bathing, dressing, using the toilet or eating? Independent  Indoor Mobility: Did the patient need assistance with walking from room to room (with or without device)? Independent  Stairs: Did the patient need assistance with internal or external stairs (with or without device)? Independent  Functional Cognition: Did the patient need help planning regular tasks such as shopping or remembering to take medications? Needed some help  Home  Assistive Devices / Equipment Home Assistive Devices/Equipment: CBG Meter, Eyeglasses Home Equipment: None  Prior Device Use: Indicate devices/aids used by the patient prior to current illness, exacerbation or injury? None of the above  Current Functional Level Cognition  Arousal/Alertness: Awake/alert Overall Cognitive Status: Impaired/Different from baseline Difficult to assess due to: (Soft spoken) Current Attention Level: Sustained Orientation Level: Oriented to person, Oriented to place, Oriented to situation Following Commands: Follows one step commands consistently Safety/Judgement: Decreased awareness of safety, Decreased awareness of deficits General Comments: Continues to improve, however, attention limiting performance adn requires multiple redirectional cues in minimally distracting environemtn Attention: Sustained Sustained Attention: Impaired Sustained Attention Impairment: Verbal basic Memory: Impaired Memory Impairment: Storage deficit, Retrieval deficit, Decreased recall of new information Awareness: Impaired Awareness Impairment: Intellectual impairment    Extremity Assessment (includes Sensation/Coordination)  Upper Extremity Assessment: Generalized weakness RUE Deficits / Details: using R UE functionally; generalzied weakness RUE Coordination: decreased fine motor, decreased gross motor LUE Deficits / Details: L shoulder pain limiting functional use of LUE; grasp appeasr equal to R; + impingement sign; weak abduction LUE: Unable to fully assess due to pain LUE Coordination: decreased fine motor, decreased gross motor  Lower Extremity Assessment: Defer to PT evaluation RLE Deficits / Details: seems equally weak bil LEs, ROM intact, ankles at least 3/5, knee 3-/5 (seated LAQ) , hip flexion 2/5.   LLE Deficits / Details: seems equally weak bil LEs, ROM intact, ankles at least 3/5, knee 3-/5 (seated LAQ) , hip flexion 2/5.      ADLs  Overall ADL's : Needs  assistance/impaired Eating/Feeding: Minimal assistance, Maximal assistance, Sitting Eating/Feeding Details (indicate cue type and reason): Performed self feeding with SLP present for swallowing.  Grooming:  Minimal assistance, Standing Grooming Details (indicate cue type and reason): standing at sink to brush teeth; cues to turn off water; sequenced task without cues Upper Body Bathing: Maximal assistance, Sitting Upper Body Bathing Details (indicate cue type and reason): Pt requiring Max A for sitting balance at EOB during bathing. Pt requiring Max A to complete bathing task due to decreased processing and balance.  Lower Body Bathing: Moderate assistance, Sit to/from stand Lower Body Bathing Details (indicate cue type and reason): Pt standing with use of stedy and maintaining standing for second person to complete LB bathing Upper Body Dressing : Total assistance, Sitting Lower Body Dressing: Minimal assistance, Sit to/from stand(for socks) Lower Body Dressing Details (indicate cue type and reason): Sitting EOB to donn socks; does better at bed level Toilet Transfer: Moderate assistance, RW(ambulating) Toilet Transfer Details (indicate cue type and reason): Ambulated to toilet; unsafely trying to sit before completly reaching toilet; feel fatigue factoring  in performance Toileting- Clothing Manipulation and Hygiene: Moderate assistance Toileting - Clothing Manipulation Details (indicate cue type and reason): incontinenet of urine at end of session; Max A to clean self Functional mobility during ADLs: Rolling walker, +2 for physical assistance, Minimal assistance General ADL Comments: Min-Mod A +2 for balance and safety abulate ~10 feet to sink and then sit in recliner once finished with grooming. Pt tolerating standing at sink for ~ 15 minutes. Pt highly motivated to particiapte in therapy. He also enjoyed listening to one of his favorite artists "Sage". At times, pt becoming distracted by music  and requiring quiet environement to follow cues and participate in problem solving    Mobility  Overal bed mobility: Needs Assistance Bed Mobility: Supine to Sit Rolling: Supervision Sidelying to sit: Min assist Supine to sit: Min guard, HOB elevated Sit to supine: Mod assist Sit to sidelying: Min guard General bed mobility comments: sitting on bed edge w/OT    Transfers  Overall transfer level: Needs assistance Equipment used: Rolling walker (2 wheeled) Transfer via Lift Equipment: Stedy Transfers: Sit to/from [employer] to Stand: Min assist, +2 safety/equipment Stand pivot transfers: Mod assist, +2 safety/equipment Squat pivot transfers: Max assist, +2 physical assistance General transfer comment: trunk forward but will stand erect with multimodal cues.; able to achieve upright posture frequent cues for posture. but unable to sustain do to poor attention    Ambulation / Gait / Stairs / Wheelchair Mobility  Ambulation/Gait Ambulation/Gait assistance: Mod assist, +2 safety/equipment Gait Distance (Feet): 20 Feet(x 2) Assistive device: Rolling walker (2 wheeled) Gait Pattern/deviations: Step-through pattern, Decreased step length - right, Decreased step length - left, Shuffle, Trunk flexed General Gait Details: multimodal cues for posture, improved advancement/swing  of the left leg/ patient requires cues to turn and back up fully to surface. All aspectss of mobility are sluggish. Gait velocity: decreased Gait velocity interpretation: <1.31 ft/sec, indicative of household ambulator    Posture / Balance Dynamic Sitting Balance Sitting balance - Comments: able to sit unsupoorted EOB Balance Overall balance assessment: Needs assistance Sitting-balance support: Feet supported, Bilateral upper extremity supported Sitting balance-Leahy Scale: Fair Sitting balance - Comments: able to sit unsupoorted EOB Postural control: Posterior lean Standing balance support: Bilateral upper  extremity supported Standing balance-Leahy Scale: Poor Standing balance comment: reliant on external support    Special needs/care consideration BiPAP/CPAP: no CPM : no Continuous Drip IV: no Dialysis : no        Days : no Life Vest : no Oxygen : no, RA Special Bed : no Lurline Idol  Size : no Wound Vac (area) : no      Location  no Skin: abrasion to face, neck; serous blister on right heel and buttocks, excoriated location on medial back, MASD to bilateral buttocks, skin tear to right, posterior buttocks; stage 2 pressure injury to right cheek, deep tissue injury to left heel, deep tissue injury to bilateral buttocks, non-pressure wound cervical medial wide skin tear from ETT holder.                       Bowel mgmt: Last BM: 04/10/19 incontinent  Bladder mgmt: external urinary catheter; incontinent Diabetic mgmt: yes Behavioral consideration : impulsive, safety sitter 1:1 Chemo/radiation : no   Previous Home Environment (from acute therapy documentation) Living Arrangements: Children Available Help at Discharge: Available 24 hours/day, Family Type of Home: House Home Layout: Two level, 1/2 bath on main level Alternate Level Stairs-Rails: Right Alternate Level Stairs-Number of Steps: flight Home Access: Stairs to enter Entrance Stairs-Rails: Right, Left Entrance Stairs-Number of Steps: 5 Bathroom Shower/Tub: Government social research officer Accessibility: Yes How Accessible: Accessible via walker Home Care Services: No Additional Comments: Plan to go to [facility] to stay with Jacqulynn Cadet, x-wife  Discharge Living Setting Plans for Discharge Living Setting: Other (Comment)(plan to go to [place] to live with ex-wife and her dtr) Type of Home at Discharge: House Discharge Home Layout: Two level, 1/2 bath on main level Alternate Level Stairs-Rails: Right Alternate Level Stairs-Number of Steps: 9 steps then landing with another 5 steps Discharge Home Access: Stairs to  enter Entrance Stairs-Rails: Left Entrance Stairs-Number of Steps: one step with landing x3, then 3 steps up on porch Discharge Bathroom Shower/Tub: Tub/shower unit Discharge Bathroom Toilet: Standard Discharge Bathroom Accessibility: Yes How Accessible: Accessible via walker Does the patient have any problems obtaining your medications?: No  Social/Family/Support Systems Patient Roles: Other (Comment)(has son, plans to live with ex-wife in [place]) Contact Information: ex-wife: [name] [phone]; son ([place]): [phone] Anticipated Caregiver: [name] and her duaghter  Anticipated Ambulance person Information: see above Ability/Limitations of Caregiver: Min G/Supervision (both [name] and her daughter have lifitng restrictions due to back issues and pregnancy) Caregiver Availability: 24/7 Discharge Plan Discussed with Primary Caregiver: Yes Is Caregiver In Agreement with Plan?: Yes Does Caregiver/Family have Issues with Lodging/Transportation while Pt is in Rehab?: No([name] plans to drive from [place] to pick pt up at [place])  Goals/Additional Needs Patient/Family Goal for Rehab: PT: Supervision, OT: Supervision/Min A; SLP: Supervision/Min A Expected length of stay: 16-20 days Cultural Considerations: NA Dietary Needs: DYS 2, nectar thick liquids; room service not appropriate  Equipment Needs: TBD Pt/Family Agrees to Admission and willing to participate: Yes Program Orientation Provided & Reviewed with Pt/Caregiver Including Roles  & Responsibilities: Yes(pt and his ex-wife)  Barriers to Discharge: Home environment access/layout, Lack of/limited family support, Other (comments)  Barriers to Discharge Comments: steps to enter; 2nd story living quarters, will need to be Min G/Supervision at [place] due to limited physical assist; pt will then need to tolerate car ride to [place] at [place]. Spoke with ex-wife about possibility of hourly breaks during 7+ hour car ride and having two people to assist  with trip for safety. Ex-wife in agreement.  Decrease burden of Care through IP rehab admission: NA  Possible need for SNF placement upon discharge: Not anticipated; pt plans to return to ex-wife's house where she and her daughter can provide 24/7 A as needed  Patient Condition: I have reviewed [facility] from [facility], spoken with RN, and patient and family member. I discussed via phone for inpatient rehabilitation assessment.  Patient will benefit from ongoing PT, OT and SLP, can actively participate in 3 hours of therapy a day 5 days of the week, and can make measurable gains during the admission.  Patient will also benefit from the coordinated team approach during an Inpatient Acute Rehabilitation admission.  The patient will receive intensive therapy as well as Rehabilitation physician, nursing, social worker, and care management interventions.  Due to bladder management, bowel management, safety, skin/wound care, disease management, medication administration, pain management and patient education the patient requires 24 hour a day rehabilitation nursing.  The patient is currently Mod A x2 for 20 feet x2 bouts with mobility and Min/Mod A for basic ADLs.  Discharge setting and therapy post discharge at home with home health is anticipated.  Patient has agreed to participate in the Acute Inpatient Rehabilitation Program and will admit today.  Preadmission Screen Completed By:  Jhonnie Garner, 04/11/2019 11:03 AM ______________________________________________________________________   Discussed status with Dr. Naaman Plummer on 04/11/19 at 11:03AM and received approval for admission today.  Admission Coordinator:  Jhonnie Garner, OT, time 11:31AM/Date 04/11/19   Assessment/Plan: Diagnosis: debility related respiratory failure and multiple medical 1. Does the need for close, 24 hr/day Medical supervision in concert with the patient's rehab needs make it unreasonable for this patient to  be served in a less intensive setting? Yes 2. Co-Morbidities requiring supervision/potential complications: COVID +, DM2, HTN 3. Due to bladder management, bowel management, safety, skin/wound care, disease management, medication administration, pain management and patient education, does the patient require 24 hr/day rehab nursing? Yes 4. Does the patient require coordinated care of a physician, rehab nurse, PT (1-2 hrs/day, 5 days/week), OT (1-2 hrs/day, 5 days/week) and SLP (1-2 hrs/day, 5 days/week) to address physical and functional deficits in the context of the above medical diagnosis(es)? Yes Addressing deficits in the following areas: balance, endurance, locomotion, strength, transferring, bowel/bladder control, bathing, dressing, feeding, grooming, toileting, cognition, speech, swallowing and psychosocial support 5. Can the patient actively participate in an intensive therapy program of at least 3 hrs of therapy 5 days a week? Yes 6. The potential for patient to make measurable gains while on inpatient rehab is excellent 7. Anticipated functional outcomes upon discharge from inpatients are: supervision PT, supervision and min assist OT, supervision and min assist SLP 8. Estimated rehab length of stay to reach the above functional goals is: 15-20 days 9. Anticipated D/C setting: Home 10. Anticipated post D/C treatments: [place] therapy 11. Overall Rehab/Functional Prognosis: excellent  MD Signature: Benjamin Staggers, MD, [place] [facility] 04/11/2019         Revision History

## 2019-04-11 NOTE — Progress Notes (Signed)
Report given to 4W rehab nurse.

## 2019-04-11 NOTE — Progress Notes (Signed)
Inpatient Rehabilitation-Admissions Coordinator   [facility] has received medical approval from Dr. Rito Ehrlich to admit to CIR today. Pt now had two negative test results and has been without fever for >3 days without any anti-pyretics. Pt will be admitted to CIR today.   AC has spoken with RN and CM regarding plan.   CM addressing transport needs currently.   Please call if questions.   Nanine Means, OTR/L  Rehab Admissions Coordinator  [phone] 04/11/2019 11:29 AM

## 2019-04-11 NOTE — H&P (Deleted)
Physical Medicine and Rehabilitation Admission H&P        Chief Complaint  Patient presents with   Debility.       HPI: Benjamin Cohen is a 63 year old male with history of CVA, T2DM and HTN who was admitted on 03/11/19 with fever, cough and SOB that developed after attending a wedding mid March with multiple family members who were diagnosed with COVID. CXR showed multifocal opacities and work up positive for COVID 19. He required intubation due to ARDS and was  started on Plaquenil, toclizamab X 1,  Ceftriaxone and Zithromax . He tolerated extubation by 4/12 but developed increase WOB despite HFNC and was reintubated  on 4/17. He was started on broad spectrum antibiotics for MRSA HCAP and moved to [facility].  He tolerated extubation by  4/20 and has completed course of diflucan as well as Vancomycin. Hospital course significant for delirium, few beats asymptomatic VT, dysphagia--has pulled out NGT multiple times. MBS done 4/29 and patient started on dysphagia 2,  Nectar liquids. He did sustain a fall 4/29--no injuries reported. Therapy ongoing and patient noted to have deficits in processing, hypophonia with decrease in verbal output, balance deficits as well as dysphagia. CIR recommended due to encephalopathy with debility.      Review of Systems  Respiratory: Positive for cough and shortness of breath.             Past Medical History:  Diagnosis Date   Hypertension     Stroke [facility])             Past Surgical History:  Procedure Laterality Date   NO PAST SURGERIES               Family History  Problem Relation Age of Onset   Cancer Mother     Hypertension Brother        Social History:  reports that he has never smoked. He has never used smokeless tobacco. He reports that he does not drink alcohol or use drugs.      Allergies: No Known Allergies            Medications Prior to Admission  Medication Sig Dispense Refill   atorvastatin (LIPITOR) 40 MG tablet  Take 40 mg by mouth daily at 6 PM.       clopidogrel (PLAVIX) 75 MG tablet Take 75 mg by mouth daily.       glimepiride (AMARYL) 1 MG tablet Take 1 mg by mouth daily with breakfast.       ibuprofen (ADVIL,MOTRIN) 200 MG tablet Take 200 mg by mouth every 6 (six) hours as needed for headache or moderate pain.       metFORMIN (GLUCOPHAGE) 1000 MG tablet Take 1,000 mg by mouth 2 (two) times daily with a meal.       blood glucose meter kit and supplies Dispense based on patient and insurance preference. Use up to four times daily as directed. (FOR ICD-10 E10.9, E11.9). 1 each 0      Drug Regimen Review  Drug regimen was reviewed and remains appropriate with no significant issues identified   Home: Home Living Family/patient expects to be discharged to:: Private residence Living Arrangements: Children Available Help at Discharge: Available 24 hours/day, Family Type of Home: House Home Access: Stairs to enter Technical brewer of Steps: 5 Entrance Stairs-Rails: Right, Left Home Layout: Two level, 1/2 bath on main level Alternate Level Stairs-Number of Steps: flight Alternate Level Stairs-Rails: Right  Bathroom Shower/Tub: Optometrist: Yes Home Equipment: None Additional Comments: Plan to go to [facility] to stay with Jacqulynn Cadet, x-wife   Functional History: Prior Function Level of Independence: Independent Comments: Drives; 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); likes to fish; enjoys Location manager Status:  Mobility: Bed Mobility Overal bed mobility: Needs Assistance Bed Mobility: Supine to Sit Rolling: Supervision Sidelying to sit: Min assist Supine to sit: Min guard, HOB elevated Sit to supine: Mod assist Sit to sidelying: Min guard General bed mobility comments: sitting on bed edge w/OT Transfers Overall transfer level: Needs assistance Equipment used: Rolling walker (2  wheeled) Transfer via Lift Equipment: Stedy Transfers: Sit to/from [employer] to Stand: Min assist, +2 safety/equipment Stand pivot transfers: Mod assist, +2 safety/equipment Squat pivot transfers: Max assist, +2 physical assistance General transfer comment: trunk forward but will stand erect with multimodal cues.; able to achieve upright posture frequent cues for posture. but unable to sustain do to poor attention Ambulation/Gait Ambulation/Gait assistance: Mod assist, +2 safety/equipment Gait Distance (Feet): 20 Feet(x 2) Assistive device: Rolling walker (2 wheeled) Gait Pattern/deviations: Step-through pattern, Decreased step length - right, Decreased step length - left, Shuffle, Trunk flexed General Gait Details: multimodal cues for posture, improved advancement/swing  of the left leg/ patient requires cues to turn and back up fully to surface. All aspectss of mobility are sluggish. Gait velocity: decreased Gait velocity interpretation: <1.31 ft/sec, indicative of household ambulator   ADL: ADL Overall ADL's : Needs assistance/impaired Eating/Feeding: Minimal assistance, Maximal assistance, Sitting Eating/Feeding Details (indicate cue type and reason): Performed self feeding with SLP present for swallowing.  Grooming: Minimal assistance, Standing Grooming Details (indicate cue type and reason): standing at sink to brush teeth; cues to turn off water; sequenced task without cues Upper Body Bathing: Maximal assistance, Sitting Upper Body Bathing Details (indicate cue type and reason): Pt requiring Max A for sitting balance at EOB during bathing. Pt requiring Max A to complete bathing task due to decreased processing and balance.  Lower Body Bathing: Moderate assistance, Sit to/from stand Lower Body Bathing Details (indicate cue type and reason): Pt standing with use of stedy and maintaining standing for second person to complete LB bathing Upper Body Dressing : Total assistance,  Sitting Lower Body Dressing: Minimal assistance, Sit to/from stand(for socks) Lower Body Dressing Details (indicate cue type and reason): Sitting EOB to donn socks; does better at bed level Toilet Transfer: Moderate assistance, RW(ambulating) Toilet Transfer Details (indicate cue type and reason): Ambulated to toilet; unsafely trying to sit before completly reaching toilet; feel fatigue factoring  in performance Toileting- Clothing Manipulation and Hygiene: Moderate assistance Toileting - Clothing Manipulation Details (indicate cue type and reason): incontinenet of urine at end of session; Max A to clean self Functional mobility during ADLs: Rolling walker, +2 for physical assistance, Minimal assistance General ADL Comments: Min-Mod A +2 for balance and safety abulate ~10 feet to sink and then sit in recliner once finished with grooming. Pt tolerating standing at sink for ~ 15 minutes. Pt highly motivated to particiapte in therapy. He also enjoyed listening to one of his favorite artists "Sage". At times, pt becoming distracted by music and requiring quiet environement to follow cues and participate in problem solving   Cognition: Cognition Overall Cognitive Status: Impaired/Different from baseline Arousal/Alertness: Awake/alert Orientation Level: Oriented to person, Oriented to place, Oriented to situation Attention: Sustained Sustained Attention: Impaired Sustained Attention Impairment: Verbal basic Memory:  Impaired Memory Impairment: Storage deficit, Retrieval deficit, Decreased recall of new information Awareness: Impaired Awareness Impairment: Intellectual impairment Cognition Arousal/Alertness: Awake/alert Behavior During Therapy: Flat affect, Restless, Impulsive Overall Cognitive Status: Impaired/Different from baseline Area of Impairment: Orientation, Attention, Memory, Following commands, Safety/judgement, Awareness, Problem solving Orientation Level: Disoriented to, Time Current  Attention Level: Sustained Memory: Decreased recall of precautions, Decreased short-term memory Following Commands: Follows one step commands consistently Safety/Judgement: Decreased awareness of safety, Decreased awareness of deficits Awareness: Emergent Problem Solving: Slow processing, Difficulty sequencing, Decreased initiation, Requires verbal cues, Requires tactile cues General Comments: Continues to improve, however, attention limiting performance adn requires multiple redirectional cues in minimally distracting environemtn Difficult to assess due to: (Soft spoken)   Physical Exam: Blood pressure 129/86, pulse (!) 105, temperature 98.9 F (37.2 C), temperature source Oral, resp. rate (!) 22, height _0  (1.702 m), weight 101.3 kg, SpO2 94 %. Physical Exam  Constitutional: He appears well-developed.  HENT:  Head: Normocephalic.  Eyes: Pupils are equal, round, and reactive to light.  Neck: Normal range of motion.  Cardiovascular: Normal rate.  Respiratory: Effort normal.  GI: Soft.  Musculoskeletal:        General: No edema.  Neurological: He is alert.  Oriented to person, hospital. Delays in processing. Follows simple commands. Moves all 4's  Psychiatric: He has a normal mood and affect.      Lab Results Last 48 Hours        Results for orders placed or performed during the hospital encounter of 03/11/19 (from the past 48 hour(s))  Glucose, capillary     Status: Abnormal    Collection Time: 04/09/19 12:43 PM  Result Value Ref Range    Glucose-Capillary 150 (H) 70 - 99 mg/dL  Glucose, capillary     Status: Abnormal    Collection Time: 04/09/19  4:50 PM  Result Value Ref Range    Glucose-Capillary 142 (H) 70 - 99 mg/dL  Glucose, capillary     Status: Abnormal    Collection Time: 04/09/19  7:49 PM  Result Value Ref Range    Glucose-Capillary 141 (H) 70 - 99 mg/dL  Glucose, capillary     Status: Abnormal    Collection Time: 04/09/19 11:16 PM  Result Value Ref Range     Glucose-Capillary 143 (H) 70 - 99 mg/dL  Glucose, capillary     Status: Abnormal    Collection Time: 04/10/19  3:31 AM  Result Value Ref Range    Glucose-Capillary 126 (H) 70 - 99 mg/dL  Novel Coronavirus, NAA (hospital order; send-out to ref lab)     Status: None    Collection Time: 04/10/19  3:37 AM  Result Value Ref Range    SARS-CoV-2, NAA NOT DETECTED NOT DETECTED      Comment: (NOTE) This test was developed and its performance characteristics determined by [employer]. This test has not been FDA cleared or approved. This test has been authorized by FDA under an Emergency Use Authorization (EUA). This test has been validated in accordance with the FDA's Guidance Document (Policy for [place] in Laboratories Certified to Perform High Complexity Testing under CLIA prior to Emergency Use Authorization for Coronavirus [id number]-2257 during the [facility] Emergency) issued on February 29th, 2020. FDA independent review of this validation is pending. This test is only authorized for the duration of time the declaration that circumstances exist justifying the authorization of the emergency use of in vitro diagnostic tests for detection of SARS-CoV- 2 virus and/or diagnosis of COVID-19  infection under section 564(b)(1) of the Act, 21 U.S.C. 144YJE-5(U)(3), unless the authorization is terminated or revoked sooner. Performed At: [facility] [place], [place] [id number] Rush Farmer MD CH:[id number]      Coronavirus Source NASOPHARYNGEAL        Comment: Performed at [facility] [address] [place], [place] [address]  Basic metabolic panel     Status: Abnormal    Collection Time: 04/10/19  5:32 AM  Result Value Ref Range    Sodium 140 135 - 145 mmol/L    Potassium 3.5 3.5 - 5.1 mmol/L    Chloride 108 98 - 111 mmol/L    CO2 25 22 - 32 mmol/L    Glucose, Bld 140 (H) 70 - 99 mg/dL    BUN 7 (L) 8 - 23 mg/dL     Creatinine, Ser 0.85 0.61 - 1.24 mg/dL    Calcium 8.7 (L) 8.9 - 10.3 mg/dL    GFR calc non Af Amer >60 >60 mL/min    GFR calc Af Amer >60 >60 mL/min    Anion gap 7 5 - 15      Comment: Performed at [facility], [place] [address], [place], [place] [address]  Magnesium     Status: None    Collection Time: 04/10/19  5:32 AM  Result Value Ref Range    Magnesium 1.8 1.7 - 2.4 mg/dL      Comment: Performed at [facility], [place] [address] [place], [place] [address]  Glucose, capillary     Status: Abnormal    Collection Time: 04/10/19  8:17 AM  Result Value Ref Range    Glucose-Capillary 154 (H) 70 - 99 mg/dL  Glucose, capillary     Status: Abnormal    Collection Time: 04/10/19 12:05 PM  Result Value Ref Range    Glucose-Capillary 217 (H) 70 - 99 mg/dL  Glucose, capillary     Status: Abnormal    Collection Time: 04/10/19  4:09 PM  Result Value Ref Range    Glucose-Capillary 154 (H) 70 - 99 mg/dL  Glucose, capillary     Status: Abnormal    Collection Time: 04/10/19  9:47 PM  Result Value Ref Range    Glucose-Capillary 145 (H) 70 - 99 mg/dL  Basic metabolic panel     Status: Abnormal    Collection Time: 04/11/19  4:25 AM  Result Value Ref Range    Sodium 139 135 - 145 mmol/L    Potassium 3.6 3.5 - 5.1 mmol/L    Chloride 107 98 - 111 mmol/L    CO2 23 22 - 32 mmol/L    Glucose, Bld 157 (H) 70 - 99 mg/dL    BUN 10 8 - 23 mg/dL    Creatinine, Ser 0.78 0.61 - 1.24 mg/dL    Calcium 8.2 (L) 8.9 - 10.3 mg/dL    GFR calc non Af Amer >60 >60 mL/min    GFR calc Af Amer >60 >60 mL/min    Anion gap 9 5 - 15      Comment: Performed at [facility], [place] [address], [place], [place] [address]  Glucose, capillary     Status: Abnormal    Collection Time: 04/11/19  8:05 AM  Result Value Ref Range    Glucose-Capillary 124 (H) 70 - 99 mg/dL       Imaging Results (Last 48 hours)  Dg Swallowing Func-speech Pathology   Result Date:  04/09/2019 Objective  Swallowing Evaluation: Type of Study: MBS-Modified Barium Swallow Study  Patient Details Name: Benjamin Cohen MRN: [id number] Date of Birth: 1956-09-21 Today's Date: 04/09/2019 Time: SLP Start Time (ACUTE ONLY): 1145 -SLP Stop Time (ACUTE ONLY): 1235 SLP Time Calculation (min) (ACUTE ONLY): 50 min Past Medical History: Past Medical History: Diagnosis Date  Hypertension   Stroke [facility])  Past Surgical History: Past Surgical History: Procedure Laterality Date  NO PAST SURGERIES   HPI: 63 y/o M admitted 3/31 COVID (+). Developed worsening hypoxemia, respiratory distress 4/1 and PCCM consulted for evaluation.  Intubated 4/2 with ARDS.  Was extubated 03/23/2019, with no oxygen requirement. Initial clinical swallow evaluation 4/13 revealed concerns for severe dysphagia; NPO was recommended, TF.  Pt developed respiratory distress and was reintubated on 03/28/2019; extubated 4/20. Hx prior strokes.  Subjective: alert Assessment / Plan / Recommendation CHL IP CLINICAL IMPRESSIONS 04/09/2019 Clinical Impression Pt has a mild-moderate oropharyngeal dysphagia that appears to have more of an underlying neurological etiology, as compared to most typical post-extubation dysphagias. He has lingual pumping and slow transit. Mild lingual residue is present. When drinking thin or thickened liquids via straw he does not form cohesive boluses, more so letting liquids spill passively into the pharynx as he simlutaneously keeps sucking more liquid up via straw. Consequently, his pyriforms become more filled with liquids before the swallow, and he has small amounts of silent aspiration that comes posteriorly over the arytenoids. He forms a more cohesive bolus with cup sips of nectar thick liquids and solids, with smaller amounts of premature spillage. Hyolaryngeal movement, base of tongue retraction, and epiglottic inversion are reduced, so he has residue in his valleculae and pyriform sinuses, but this is reduced either  with additional swallows (either Min cues for volitional ones or sometimes simply as he swallows the next bolus). Intermittent, trace amounts of penetration occurs with cup sips of nectar thick liquids, but this also clears well as he continues to swallow. Recommend starting Dys 2 (chopped) diet and nectar thick liquids (no straws). 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 SLP Visit Diagnosis Dysphagia, oropharyngeal phase (R13.12) Attention and concentration deficit following -- Frontal lobe and executive function deficit following -- Impact on safety and function Mild aspiration risk;Moderate aspiration risk   CHL IP TREATMENT RECOMMENDATION 04/09/2019 Treatment Recommendations Therapy as outlined in treatment plan below   Prognosis 04/09/2019 Prognosis for Safe Diet Advancement Good Barriers to Reach Goals Cognitive deficits;Time post onset;Severity of deficits Barriers/Prognosis Comment -- CHL IP DIET RECOMMENDATION 04/09/2019 SLP Diet Recommendations Dysphagia 2 (Fine chop) solids;Nectar thick liquid Liquid Administration via Cup;No straw Medication Administration Whole meds with puree Compensations Minimize environmental distractions;Slow rate;Small sips/bites;Multiple dry swallows after each bite/sip Postural Changes Seated upright at 90 degrees   CHL IP OTHER RECOMMENDATIONS 04/09/2019 Recommended Consults -- Oral Care Recommendations Oral care BID Other Recommendations Order thickener from pharmacy;Prohibited food (jello, ice cream, thin soups);Remove water pitcher   CHL IP FOLLOW UP RECOMMENDATIONS 04/09/2019 Follow up Recommendations Inpatient Rehab   CHL IP FREQUENCY AND DURATION 04/09/2019 Speech Therapy Frequency (ACUTE ONLY) min 2x/week Treatment Duration 2 weeks      CHL IP ORAL PHASE 04/09/2019 Oral Phase Impaired Oral -  Pudding Teaspoon -- Oral - Pudding Cup -- Oral - Honey Teaspoon -- Oral - Honey Cup -- Oral - Nectar Teaspoon Holding  of bolus;Delayed oral transit;Lingual/palatal residue Oral - Nectar Cup Holding of bolus;Delayed oral transit;Lingual/palatal residue Oral - Nectar Straw Decreased bolus cohesion;Delayed oral transit Oral - Thin Teaspoon -- Oral - Thin Cup Holding of bolus;Premature spillage Oral - Thin Straw Delayed oral transit;Decreased bolus cohesion Oral - Puree Lingual pumping;Reduced posterior propulsion;Weak lingual manipulation;Lingual/palatal residue;Delayed oral transit Oral - Mech Soft Lingual pumping;Reduced posterior propulsion;Weak lingual manipulation;Lingual/palatal residue;Premature spillage;Delayed oral transit Oral - Regular -- Oral - Multi-Consistency -- Oral - Pill -- Oral Phase - Comment --  CHL IP PHARYNGEAL PHASE 04/09/2019 Pharyngeal Phase Impaired Pharyngeal- Pudding Teaspoon -- Pharyngeal -- Pharyngeal- Pudding Cup -- Pharyngeal -- Pharyngeal- Honey Teaspoon -- Pharyngeal -- Pharyngeal- Honey Cup -- Pharyngeal -- Pharyngeal- Nectar Teaspoon Delayed swallow initiation-pyriform sinuses;Reduced epiglottic inversion;Reduced anterior laryngeal mobility;Reduced laryngeal elevation;Reduced tongue base retraction;Pharyngeal residue - valleculae;Pharyngeal residue - pyriform Pharyngeal -- Pharyngeal- Nectar Cup Delayed swallow initiation-pyriform sinuses;Reduced epiglottic inversion;Reduced anterior laryngeal mobility;Reduced laryngeal elevation;Reduced tongue base retraction;Pharyngeal residue - valleculae;Pharyngeal residue - pyriform Pharyngeal -- Pharyngeal- Nectar Straw Delayed swallow initiation-pyriform sinuses;Reduced epiglottic inversion;Reduced anterior laryngeal mobility;Reduced laryngeal elevation;Reduced tongue base retraction;Pharyngeal residue - valleculae;Pharyngeal residue - pyriform;Penetration/Aspiration during swallow Pharyngeal Material enters airway, passes BELOW cords without  attempt by patient to eject out (silent aspiration) Pharyngeal- Thin Teaspoon -- Pharyngeal -- Pharyngeal- Thin Cup Delayed swallow initiation-pyriform sinuses;Reduced epiglottic inversion;Reduced anterior laryngeal mobility;Reduced laryngeal elevation;Reduced tongue base retraction;Pharyngeal residue - valleculae;Pharyngeal residue - pyriform;Penetration/Aspiration during swallow Pharyngeal Material enters airway, passes BELOW cords without attempt by patient to eject out (silent aspiration) Pharyngeal- Thin Straw Delayed swallow initiation-pyriform sinuses;Reduced epiglottic inversion;Reduced anterior laryngeal mobility;Reduced laryngeal elevation;Reduced tongue base retraction;Pharyngeal residue - valleculae;Pharyngeal residue - pyriform;Penetration/Aspiration during swallow Pharyngeal Material enters airway, remains ABOVE vocal cords and not ejected out Pharyngeal- Puree Delayed swallow initiation-pyriform sinuses;Reduced epiglottic inversion;Reduced anterior laryngeal mobility;Reduced laryngeal elevation;Reduced tongue base retraction;Pharyngeal residue - valleculae;Pharyngeal residue - pyriform Pharyngeal -- Pharyngeal- Mechanical Soft Delayed swallow initiation-pyriform sinuses;Reduced epiglottic inversion;Reduced anterior laryngeal mobility;Reduced laryngeal elevation;Reduced tongue base retraction;Pharyngeal residue - valleculae;Pharyngeal residue - pyriform Pharyngeal -- Pharyngeal- Regular -- Pharyngeal -- Pharyngeal- Multi-consistency -- Pharyngeal -- Pharyngeal- Pill -- Pharyngeal -- Pharyngeal Comment --  CHL IP CERVICAL ESOPHAGEAL PHASE 04/09/2019 Cervical Esophageal Phase WFL Pudding Teaspoon -- Pudding Cup -- Honey Teaspoon -- Honey Cup -- Nectar Teaspoon -- Nectar Cup -- Nectar Straw -- Thin Teaspoon -- Thin Cup -- Thin Straw -- Puree -- Mechanical Soft -- Regular -- Multi-consistency -- Pill -- Cervical Esophageal Comment -- Venita Sheffield Nix 04/09/2019, 4:27 PM  Pollyann Glen, M.A. [place] [facility] Pager [phone] Office [phone]                       Medical Problem List and Plan: 1.  Functional deficits and impaired mobility secondary to debility/encephalopathy             -admit to inpatient rehab 2.  Antithrombotics: -DVT/anticoagulation:  Pharmaceutical: Lovenox             -antiplatelet therapy: Plavix 3. Pain Management: tylenol prn 4. Mood: LCSW to follow for evaluation when appropriate.              -antipsychotic agents: Continue seroquel at bedtime. Will d/c haldol and adde Seroquel prn 5. Neuropsych: This patient is not capable of making decisions on his own behalf. 6. Skin/Wound Care: routine pressure relief measures 7. Fluids/Electrolytes/Nutrition: Monitor I/O. Check lytes in am. Now on diet-- 8. Metabolic encephalopathy/Delirium: demonstrating improvement             -  sleep-wake chart 9. HTN: Monitor BP bid --continue metoprolol for BP/HR control.   10. H/o CVA: On  Plavix.  11. T2DM: Hgb A1c- 12.4 --has history of non-compliance per records.   12. Dysphagia: On dysphagia 2, nectar liquids. Add full supervision for safety with intake.         Post Admission Physician Evaluation: 1. Functional deficits secondary  to debility. 2. Patient is admitted to receive collaborative, interdisciplinary care between the physiatrist, rehab nursing staff, and therapy team. 3. Patient's level of medical complexity and substantial therapy needs in context of that medical necessity cannot be provided at a lesser intensity of care such as a SNF. 4. Patient has experienced substantial functional loss from his/her baseline which was documented above under the "Functional History" and "Functional Status" headings.  Judging by the patient's diagnosis, physical exam, and functional history, the patient has potential for functional progress which will result in measurable gains while on inpatient rehab.  These gains will be of substantial and practical use  upon discharge  in facilitating mobility and self-care at the household level. 5. Physiatrist will provide 24 hour management of medical needs as well as oversight of the therapy plan/treatment and provide guidance as appropriate regarding the interaction of the two. 6. The Preadmission Screening has been reviewed and patient status is unchanged unless otherwise stated above. 7. 24 hour rehab nursing will assist with bladder management, bowel management, safety, skin/wound care, disease management, medication administration, pain management and patient education  and help integrate therapy concepts, techniques,education, etc. 8. PT will assess and treat for/with: Lower extremity strength, range of motion, stamina, balance, functional mobility, safety, adaptive techniques and equipment .   Goals are: supervision. 9. OT will assess and treat for/with: ADL's, functional mobility, safety, upper extremity strength, adaptive techniques and equipment, NMR.   Goals are: supervision to min assist. Therapy may proceed with showering this patient. 10. SLP will assess and treat for/with: cognition, communication.  Goals are: supervision to min assist. 11. Case Management and Social Worker will assess and treat for psychological issues and discharge planning. 12. Team conference will be held weekly to assess progress toward goals and to determine barriers to discharge. 13. Patient will receive at least 3 hours of therapy per day at least 5 days per week. 14. ELOS: 16-20 days       15. Prognosis:  excellent   Meredith Staggers, MD, [facility] 04/11/2019     Bary Leriche, PA-C 04/11/2019

## 2019-04-11 NOTE — Discharge Summary (Addendum)
[facility]  Physician Discharge Summary   Patient ID: Benjamin Cohen MRN: [id number] DOB/AGE: June 18, 1956 63 y.o.  Admit date: 03/11/2019 Discharge date: 04/11/2019  PCP: Scifres, Dorothy, PA-C  DISCHARGE DIAGNOSES:  Pneumonia and ARDS secondary to COVID-19, improved MRSA pneumonia, improved Acute metabolic encephalopathy, improving Post extubation dysphagia, improving Essential hypertension History of CVA Diabetes mellitus type 2 Normocytic anemia  PATIENT BEING DISCHARGED TO [facility]   RECOMMENDATIONS FOR OUTPATIENT FOLLOW UP: 1. Recommend checking CBC and basic metabolic panel every 4 to 5 days.   CODE STATUS: Full code  DISCHARGE CONDITION: fair  Diet recommendation: Dysphagia 2 diet with nectar thick liquids  INITIAL HISTORY: 63 y/o M admitted 3/31 with fever, cough, SOB and confusion. He attended a wedding mid March, after which multiple family members were diagnosed with COVID including his wife. He developed worsening hypoxemia w/ respiratory distress 4/1 and PCCM was consulted for evaluation. Intubated 4/2 with ARDS. Extubated 4/12. Had to be reintubated 4/17.  Upon further stability-he was transferred out of the ICU- post extubation course complicated by severe delirium, severe dysphagia requiring NG tube.  Unfortunately, patient has pulled out his NG tube repeatedly-speech therapy following closely to see if diet can be reinitiated-remains n.p.o.  Significant Events: 3/31 Admit, 2L, bilateral patchy infiltrates  4/01 PCCM consulted 4/02 Intubated 4/03 toclizamab x 1 4/05 Net positive, remains on paralytic, still prone 4/06 Supine, plateau pressure 23, 50% / 12 PEEP; complete 5 days of plaquenil 4/08 Plateau 23, PEEP 12 / 40% 4/14 transfer to floor bed 4/17 re-intubated   4/20 extubated 4/22 SLP eval > NPO 4/24 NG tube reinserted and NG feedings started 4/25 to 4/27>> repeatedly has pulled out NG tube-requiring  reinsertion  COVID-19 specific Treatment: Actemra x1 Plaquenil 4/1 > 4/6   Consultations:  Pulmonology   HOSPITAL COURSE:   Acute Hypoxic Resp Failure - Covid 19 associated ARDS - MRSA Pneumonia:  Patient appears to be doing well.  His respiratory status is stable.  Saturating normal on room air.  He is afebrile.  He is completed a course of IV vancomycin.  Continue incentive spirometry.  Mobilize as tolerated.  Has been afebrile for the last 3 days.    Acute metabolic encephalopathy - ICU delirium:  Patient has improved significantly though he still is distracted.  Continue low-dose Seroquel.  QTC has been normal.  Could consider increasing the dose of Seroquel if he continues to have agitation overnight.  Fall on 4/29 No injuries noted.  Continue to monitor.  Patient moving all his extremities.    Questionable NSVT, likely artifact Telemetry showed concern for NSVT although some of them appears to be artifact.  Patient noted to be tapping on his chest very frequently when I was in his room.  This could be the reason for the artifact.  Continue low-dose beta-blocker.  Dysphagia- post extubation Probably secondary to prolonged intubation, he also has a history of prior CVA.  Patient underwent modified barium swallow on 4/29 and was cleared for dysphagia to diet which he seems to be tolerating well.  Continue to monitor.  IV fluids were discontinued.  Continue to monitor CBGs.   Essential hypertension  Blood pressure reasonably well controlled on amlodipine and metoprolol.    History of CVA Continue Plavix and statin.  Stable.    Hypokalemia Potassium has improved.  Will order daily supplementation.  Magnesium was 1.8 yesterday and he was given magnesium as well.    DM2 CBGs are reasonably well controlled.  Only on SSI which will be continued.  HbA1c 12.4.  Patient was on glimepiride and metformin at home.  Normocytic anemia Secondary to acute illness.  Hemoglobin  has remained stable.  No evidence of overt bleeding.    Nutrition Patient seems to be tolerating his dysphagia 2 diet well.    Deconditioning Secondary to acute/critical illness.    Patient seen by PT and OT.  Thought to require inpatient rehab.  Evaluated by [facility] inpatient rehabilitation department.  They wanted 2 negative COVID-19 test results.  He has had 2 NEGATIVE test results.  Okay for discharge today.    Patient is stable.  Okay for discharge to inpatient rehabilitation at [facility].   PERTINENT LABS:  The results of significant diagnostics from this hospitalization (including imaging, microbiology, ancillary and laboratory) are listed below for reference.    Microbiology: Recent Results (from the past 240 hour(s))  Novel Coronavirus, NAA (hospital order; send-out to ref lab)     Status: None   Collection Time: 04/08/19  2:10 PM  Result Value Ref Range Status   SARS-CoV-2, NAA NOT DETECTED NOT DETECTED Final    Comment: (NOTE) This test was developed and its performance characteristics determined by [employer]. This test has not been FDA cleared or approved. This test has been authorized by FDA under an Emergency Use Authorization (EUA). This test has been validated in accordance with the FDA's Guidance Document (Policy for [place] in Laboratories Certified to Perform High Complexity Testing under CLIA prior to Emergency Use Authorization for Coronavirus [id number] during the [facility] Emergency) issued on February 29th, 2020. FDA independent review of this validation is pending. This test is only authorized for the duration of time the declaration that circumstances exist justifying the authorization of the emergency use of in vitro diagnostic tests for detection of SARS-CoV- 2 virus and/or diagnosis of COVID-19 infection under section 564(b)(1) of the Act, 21 U.S.C. 419QQI-2(L)(7), unless the authorization is terminated or revoked  sooner. Performed At: [facility] [place], [place] [id number] Rush Farmer MD DE:[id number]    [place]  Final    Comment: Performed at [facility] [address] [place], [place] [address]  Novel Coronavirus, NAA (hospital order; send-out to ref lab)     Status: None   Collection Time: 04/10/19  3:37 AM  Result Value Ref Range Status   SARS-CoV-2, NAA NOT DETECTED NOT DETECTED Final    Comment: (NOTE) This test was developed and its performance characteristics determined by [employer]. This test has not been FDA cleared or approved. This test has been authorized by FDA under an Emergency Use Authorization (EUA). This test has been validated in accordance with the FDA's Guidance Document (Policy for [place] in Laboratories Certified to Perform High Complexity Testing under CLIA prior to Emergency Use Authorization for Coronavirus [id number] during the [facility] Emergency) issued on February 29th, 2020. FDA independent review of this validation is pending. This test is only authorized for the duration of time the declaration that circumstances exist justifying the authorization of the emergency use of in vitro diagnostic tests for detection of SARS-CoV- 2 virus and/or diagnosis of COVID-19 infection under section 564(b)(1) of the Act, 21 U.S.C. 277AJO-8(N)(8), unless the authorization is terminated or revoked sooner. Performed At: [facility] [address] [place], [place] [id number] Rush Farmer MD SJ:[id number]    [place]  Final    Comment: Performed at [facility]  Hospital, [place] [address], [place], [place] [address]     Labs: Basic Metabolic Panel: Recent Labs  Lab 04/07/19 0540 04/08/19 0400 04/09/19 0515 04/10/19 0532 04/11/19 0425  NA 138 138 139 140 139  K 4.1 3.4* 3.4* 3.5 3.6  CL 105 105 105 108 107  CO2 24 24  24 25 23   GLUCOSE 274* 126* 138* 140* 157*  BUN 6* 7* 6* 7* 10  CREATININE 0.80 0.79 0.79 0.85 0.78  CALCIUM 8.5* 8.5* 8.6* 8.7* 8.2*  MG  --   --  1.9 1.8  --    CBC: Recent Labs  Lab 04/05/19 0400 04/08/19 0400  WBC 6.7 4.8  HGB 10.1* 10.5*  HCT 33.4* 33.6*  MCV 86.8 85.7  PLT 353 437*    CBG: Recent Labs  Lab 04/10/19 0817 04/10/19 1205 04/10/19 1609 04/10/19 2147 04/11/19 0805  GLUCAP 154* 217* 154* 145* 124*     IMAGING STUDIES Dg Chest 1 View  Result Date: 03/23/2019 CLINICAL DATA:  Initial evaluation for COVID-19 infection EXAM: CHEST  1 VIEW COMPARISON:  Prior radiograph from 03/21/2019 FINDINGS: Patient is intubated with the tip of the endotracheal tube positioned 2.4 cm above the carina. Left-sided PICC catheter in place with tip overlying the distal SVC. Enteric tube courses into the abdomen. Cardiac and mediastinal silhouette stable, and remain within normal limits. Lungs mildly hypoinflated. Scattered patchy multifocal bilateral airspace opacities persist, but are overall improved from previous exam. Underlying mild pulmonary interstitial congestion without overt pulmonary edema. No pleural effusion. No pneumothorax. Osseous structures unchanged. IMPRESSION: 1. Tip of the endotracheal tube positioned 2.4 cm above the carina. Remaining support apparatus in satisfactory position. 2. Persistent but improved multifocal bilateral airspace opacities with improved aeration of both lungs. Electronically Signed   By: Jeannine Boga M.D.   On: 03/23/2019 05:46   Dg Abd 1 View  Result Date: 04/04/2019 CLINICAL DATA:  NG tube EXAM: ABDOMEN - 1 VIEW COMPARISON:  04/01/2019 FINDINGS: NG tube coils in the stomach with the tip in the fundus, similar to prior study. Nonobstructive bowel gas pattern. IMPRESSION: NG tube coils in the stomach with the tip in the fundus. Electronically Signed   By: Rolm Baptise M.D.   On: 04/04/2019 19:00   Dg Abd 1 View  Result Date:  04/01/2019 CLINICAL DATA:  NG tube placement. EXAM: ABDOMEN - 1 VIEW COMPARISON:  None. FINDINGS: Endotracheal tube loops over the gastric body with tip and side hole overlying the proximal stomach. Gas is present in nondilated loops of small and large bowel throughout the abdomen without evidence of obstruction. A catheter projects over the right atrium. No acute osseous abnormality is seen. IMPRESSION: Enteric tube in the proximal stomach. Electronically Signed   By: Logan Bores M.D.   On: 04/01/2019 16:44   Dg Chest Port 1 View  Result Date: 04/05/2019 CLINICAL DATA:  Shortness of breath, respiratory failure secondary to COVID-19 and status post extubation. EXAM: PORTABLE CHEST 1 VIEW COMPARISON:  03/30/2019 FINDINGS: Stable heart size. Nasogastric tube extends below the diaphragm into the stomach. Left-sided PICC line extends to the lower SVC. Lungs show improved aeration at both bases with some bibasilar atelectasis remaining as well as mild interstitial prominence in both lungs, left greater than right. No significant focal airspace consolidation, pulmonary edema, pleural fluid or pneumothorax identified. IMPRESSION: Improved aeration at both lung bases. Mild interstitial prominence in both lungs, left greater than right. Electronically Signed   By: Aletta Edouard M.D.   On:  04/05/2019 15:58   Dg Chest Port 1 View  Result Date: 03/30/2019 CLINICAL DATA:  63 y/o M; COVID-19 positive. Acute respiratory failure. EXAM: PORTABLE CHEST 1 VIEW COMPARISON:  None. FINDINGS: Endotracheal tube tip 3.3 cm above the carina. Enteric tube tip is below the field of view in the abdomen. Patchy opacities in the lung bases. No pleural effusion or pneumothorax. No acute osseous abnormality is evident. IMPRESSION: Endotracheal tube tip 3.3 cm above the carina. Enteric tube tip is below the field of view in the abdomen. Patchy opacities in the lung bases compatible with pneumonia including atypical and viral pneumonia.  Electronically Signed   By: Kristine Garbe M.D.   On: 03/30/2019 04:19   Dg Chest Port 1 View  Result Date: 03/28/2019 CLINICAL DATA:  Intubated, COVID-19 positive EXAM: PORTABLE CHEST 1 VIEW COMPARISON:  Chest radiograph from earlier today. FINDINGS: Endotracheal tube tip is 1.8 cm above the carina. Left PICC terminates at the cavoatrial junction. Enteric tube enters stomach with the tip not seen on this image. Stable cardiomediastinal silhouette with normal heart size. No pneumothorax. No pleural effusion. Patchy right infrahilar and left lung base opacity, similar, with improved aeration of lower lungs. IMPRESSION: 1.  Endotracheal tube tip is 1.8 cm above the carina. 2. Improved lower lung aeration with persistent patchy right infrahilar and left lung base opacities compatible with pneumonia. Electronically Signed   By: Ilona Sorrel M.D.   On: 03/28/2019 16:35   Dg Chest Port 1 View  Result Date: 03/28/2019 CLINICAL DATA:  Hypoxia EXAM: PORTABLE CHEST 1 VIEW COMPARISON:  03/27/2019 FINDINGS: Left lower lobe airspace disease. Small left pleural effusion. No pneumothorax. Stable cardiomediastinal silhouette. No aggressive osseous lesion. IMPRESSION: 1. Left lower lobe airspace disease which may reflect atelectasis versus pneumonia. Small left pleural effusion. Electronically Signed   By: Kathreen Devoid   On: 03/28/2019 14:58   Dg Chest Port 1 View  Result Date: 03/27/2019 CLINICAL DATA:  Positive COVID-19, respiratory distress EXAM: PORTABLE CHEST 1 VIEW COMPARISON:  03/27/2019 FINDINGS: Cardiac shadows within normal limits. Left PICC line and nasogastric catheter are noted and stable. Right lung is clear. Left basilar infiltrate is noted. No bony abnormality is noted. IMPRESSION: Persistent left basilar infiltrate Electronically Signed   By: Inez Catalina M.D.   On: 03/27/2019 22:48   Dg Chest Port 1 View  Result Date: 03/27/2019 CLINICAL DATA:  Hypoxia EXAM: PORTABLE CHEST 1 VIEW  COMPARISON:  March 23, 2019 FINDINGS: Central catheter tip is at the cavoatrial junction. Nasogastric tube tip and side port are below the diaphragm. No pneumothorax. There is a small right pleural effusion with patchy airspace opacity in the right base. There is atelectatic change in the left base. Left lung otherwise clear. Heart is upper normal in size with pulmonary vascularity normal. No adenopathy. No bone lesions. IMPRESSION: Tube and catheter positions as described without pneumothorax. Small right pleural effusion with suspected superimposed right base pneumonia. Left base atelectasis noted. Heart upper normal in size. Electronically Signed   By: Lowella Grip III M.D.   On: 03/27/2019 09:57   Dg Chest Port 1 View  Result Date: 03/21/2019 CLINICAL DATA:  ARDS secondary to COVID-19. EXAM: PORTABLE CHEST 1 VIEW COMPARISON:  03/18/2019 FINDINGS: Endotracheal tube tip is 3.5 cm above the carina. Orogastric or nasogastric tube enters the stomach. Left arm PICC tip is in the SVC at the SVC RA junction. Widespread patchy bilateral pulmonary infiltrates persist, improved on the right at somewhat worsened in the left  lower lobe. IMPRESSION: Lines and tubes satisfactory. Improvement in pulmonary infiltrate on the right. Some worsening of infiltrate and/or atelectasis in the left lower lobe. Electronically Signed   By: Nelson Chimes M.D.   On: 03/21/2019 05:07   Dg Chest Port 1 View  Result Date: 03/18/2019 CLINICAL DATA:  Hypoxia EXAM: PORTABLE CHEST 1 VIEW COMPARISON:  Three days ago FINDINGS: Endotracheal tube tip at the clavicular heads. PICC with tip at the right atrium. The orogastric tube at least reaches the stomach. Bilateral airspace disease in the setting of ARDS/viral pneumonia. No convincing change from yesterday when allowing for differences in rotation. No effusion or pneumothorax. Normal heart size IMPRESSION: Stable hardware positioning and bilateral airspace disease. Electronically Signed    By: Monte Fantasia M.D.   On: 03/18/2019 06:17   Dg Chest Port 1 View  Result Date: 03/15/2019 CLINICAL DATA:  Acute respiratory failure. Intubated patient. Positive for COVID-19. Follow-up exam. EXAM: PORTABLE CHEST 1 VIEW COMPARISON:  03/13/2019 and older exams. FINDINGS: Bilateral airspace opacities have progressed since the most recent prior exam, but are more similar to the exam from 03/13/2019 at 4:04 a.m. Increased consolidations most evident in the lung bases, as well as in the inferior right upper lobe. No pneumothorax. Endotracheal tube, nasogastric tube and left PICC are stable. IMPRESSION: 1. Lung aeration appears worsened compared to the most recent prior exam, 03/13/2019 at 9:39 a.m., but similar to the pre intubation exam from 03/13/2019 at 4:04 a.m. Some of this apparent change is likely technical due to larger lung volumes on the most recent prior study. However, lung opacities appear convincingly increased at the bases compared to the prior studies, some of which is likely atelectasis. 2. No other change.  Stable support apparatus. Electronically Signed   By: Lajean Manes M.D.   On: 03/15/2019 05:31   Dg Chest Port 1 View  Result Date: 03/13/2019 CLINICAL DATA:  63 year old with confirmed diagnosis of COVID-19. Acute respiratory distress requiring intubation. EXAM: PORTABLE CHEST 1 VIEW 9:39 a.m.: COMPARISON:  Chest x-ray earlier same day 4:04 a.m. and previously. FINDINGS: Endotracheal tube tip in satisfactory position projecting approximately 5 cm above the carina. Gastric tube tip in the distal stomach. Improved aeration in both lungs post intubation, with patchy airspace opacities scattered throughout both lungs. No visible pleural effusions. IMPRESSION: 1. Endotracheal tube tip in satisfactory position projecting approximately 5 cm above the carina. 2. Gastric tube tip in the distal stomach. 3. Improved aeration in both lungs post intubation, with patchy pneumonia scattered throughout  both lungs. Electronically Signed   By: Evangeline Dakin M.D.   On: 03/13/2019 10:22   Dg Chest Port 1 View  Result Date: 03/13/2019 CLINICAL DATA:  Pulmonary infiltrates. EXAM: PORTABLE CHEST 1 VIEW COMPARISON:  03/11/2019 FINDINGS: Patchy bilateral airspace disease with significant worsening. The heart is largely obscured without evident change in size. Lung volumes are low. Where there is aeration, no Kerley lines are seen. No pleural fluid or pneumothorax IMPRESSION: Bilateral pneumonia with significant progression compared to 03/11/2019. Electronically Signed   By: Monte Fantasia M.D.   On: 03/13/2019 07:19   Dg Abd Portable 1v  Result Date: 04/06/2019 CLINICAL DATA:  Assess feeding tube.  Code but prosody patient. EXAM: PORTABLE ABDOMEN - 1 VIEW COMPARISON:  April 05, 2019 FINDINGS: The NG tube terminates in the region of the gastric antrum. IMPRESSION: The NG tube terminates in the region of the gastric antrum. Electronically Signed   By: Dorise Bullion III M.D  On: 04/06/2019 07:49   Dg Abd Portable 1v  Result Date: 04/05/2019 CLINICAL DATA:  NG tube placement EXAM: PORTABLE ABDOMEN - 1 VIEW COMPARISON:  04/05/2019 FINDINGS: NG tube tip is in the mid to distal stomach. Nonobstructive bowel gas pattern. IMPRESSION: NG tube tip in the mid to distal stomach. Electronically Signed   By: Rolm Baptise M.D.   On: 04/05/2019 18:56   Dg Abd Portable 1v  Result Date: 04/05/2019 CLINICAL DATA:  63 year old male with history of enteric tube placement. EXAM: PORTABLE ABDOMEN - 1 VIEW COMPARISON:  Abdominal radiograph 04/04/2019. FINDINGS: Nasogastric tube coiled in the stomach. No pathologic dilatation of small bowel or colon. No gross pneumoperitoneum noted in the abdomen on today's supine view. IMPRESSION: 1. Nasogastric tube with tip in the stomach. 2. Nonobstructive bowel gas pattern.  No pneumoperitoneum. Electronically Signed   By: Vinnie Langton M.D.   On: 04/05/2019 15:55   Dg Abd Portable  1v  Result Date: 03/27/2019 CLINICAL DATA:  Check nasogastric catheter placement EXAM: PORTABLE ABDOMEN - 1 VIEW COMPARISON:  03/26/2019 FINDINGS: Scattered large and small bowel gas is noted. Gastric catheter is noted extending into the duodenum. No bony abnormality is noted. Persistent left basilar infiltrate is seen. IMPRESSION: Left basilar infiltrate is noted. Gastric catheter extending into the duodenum. Electronically Signed   By: Inez Catalina M.D.   On: 03/27/2019 22:51   Dg Abd Portable 1v  Result Date: 03/26/2019 CLINICAL DATA:  63 year old male with NG tube placement EXAM: PORTABLE ABDOMEN - 1 VIEW COMPARISON:  03/25/2019, FINDINGS: Gastric tube terminates in the region of the pylorus. Interval removal of weighted tip enteric feeding tube. Gas within stomach small bowel and colon. IMPRESSION: Gastric tube terminates near the pylorus. Interval removal of the weighted tip enteric feeding tube. Electronically Signed   By: Corrie Mckusick D.O.   On: 03/26/2019 15:37   Dg Abd Portable 1v  Result Date: 03/25/2019 CLINICAL DATA:  Evaluate feeding tube EXAM: PORTABLE ABDOMEN - 1 VIEW COMPARISON:  None. FINDINGS: The distal tip of the feeding tube now all flips back on itself and is probably in the distal stomach. IMPRESSION: The distal tip of the feeding tube is likely in the stomach, directed to the left. Electronically Signed   By: Dorise Bullion III M.D   On: 03/25/2019 16:43   Dg Abd Portable 1v  Result Date: 03/25/2019 CLINICAL DATA:  Evaluate feeding tube. EXAM: PORTABLE ABDOMEN - 1 VIEW COMPARISON:  March 25, 2019 FINDINGS: More of the feeding tube is located in the stomach. The distal tip is likely in the proximal duodenum, it least 14 cm proximal to the ligament of Treitz. IMPRESSION: Much of the advancement resulted in increased feeding tube in the gastric fundus. However, the distal tip has been advanced approximately 1 cm and likely terminates in the proximal duodenum, at least 14 cm  proximal to the ligament of Treitz. Electronically Signed   By: Dorise Bullion III M.D   On: 03/25/2019 14:17   Dg Abd Portable 1v  Result Date: 03/25/2019 CLINICAL DATA:  Evaluate tube placement EXAM: PORTABLE ABDOMEN - 1 VIEW COMPARISON:  November 30, 2013 FINDINGS: A feeding tube terminates in the right mid abdomen, either in the distal stomach or proximal duodenum. IMPRESSION: A feeding tube terminates in the right mid abdomen, either in the distal stomach or proximal most aspect of the duodenum. Recommend advancement before use. Electronically Signed   By: Dorise Bullion III M.D   On: 03/25/2019 12:39  Dg Swallowing Func-speech Pathology  Result Date: 04/09/2019 Objective Swallowing Evaluation: Type of Study: MBS-Modified Barium Swallow Study  Patient Details Name: TRYSTIN TERHUNE MRN: [id number] Date of Birth: Aug 08, 1956 Today's Date: 04/09/2019 Time: SLP Start Time (ACUTE ONLY): 1145 -SLP Stop Time (ACUTE ONLY): 1235 SLP Time Calculation (min) (ACUTE ONLY): 50 min Past Medical History: Past Medical History: Diagnosis Date  Hypertension   Stroke [facility])  Past Surgical History: Past Surgical History: Procedure Laterality Date  NO PAST SURGERIES   HPI: 63 y/o M admitted 3/31 COVID (+).Developed worsening hypoxemia, respiratory distress 4/1 and PCCM consulted for evaluation. Intubated 4/2 with ARDS.Was extubated 03/23/2019, with no oxygen requirement. Initial clinical swallow evaluation 4/13 revealed concerns for severe dysphagia; NPO was recommended, TF.  Pt developed respiratory distress and was reintubated on 03/28/2019; extubated 4/20. Hx prior strokes.  Subjective: alert Assessment / Plan / Recommendation CHL IP CLINICAL IMPRESSIONS 04/09/2019 Clinical Impression Pt has a mild-moderate oropharyngeal dysphagia that appears to have more of an underlying neurological etiology, as compared to most typical post-extubation dysphagias. He has lingual pumping and slow transit. Mild lingual residue is  present. When drinking thin or thickened liquids via straw he does not form cohesive boluses, more so letting liquids spill passively into the pharynx as he simlutaneously keeps sucking more liquid up via straw. Consequently, his pyriforms become more filled with liquids before the swallow, and he has small amounts of silent aspiration that comes posteriorly over the arytenoids. He forms a more cohesive bolus with cup sips of nectar thick liquids and solids, with smaller amounts of premature spillage. Hyolaryngeal movement, base of tongue retraction, and epiglottic inversion are reduced, so he has residue in his valleculae and pyriform sinuses, but this is reduced either with additional swallows (either Min cues for volitional ones or sometimes simply as he swallows the next bolus). Intermittent, trace amounts of penetration occurs with cup sips of nectar thick liquids, but this also clears well as he continues to swallow. Recommend starting Dys 2 (chopped) diet and nectar thick liquids (no straws). 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 SLP Visit Diagnosis Dysphagia, oropharyngeal phase (R13.12) Attention and concentration deficit following -- Frontal lobe and executive function deficit following -- Impact on safety and function Mild aspiration risk;Moderate aspiration risk   [facility] IP TREATMENT RECOMMENDATION 04/09/2019 Treatment Recommendations Therapy as outlined in treatment plan below   Prognosis 04/09/2019 Prognosis for Safe Diet Advancement Good Barriers to Reach Goals Cognitive deficits;Time post onset;Severity of deficits Barriers/Prognosis Comment -- [facility] IP DIET RECOMMENDATION 04/09/2019 SLP Diet Recommendations Dysphagia 2 (Fine chop) solids;Nectar thick liquid Liquid Administration via Cup;No straw Medication  Administration Whole meds with puree Compensations Minimize environmental distractions;Slow rate;Small sips/bites;Multiple dry swallows after each bite/sip Postural Changes Seated upright at 90 degrees   [facility] IP OTHER RECOMMENDATIONS 04/09/2019 Recommended Consults -- Oral Care Recommendations Oral care BID Other Recommendations Order thickener from pharmacy;Prohibited food (jello, ice cream, thin soups);Remove water pitcher   [facility] IP FOLLOW UP RECOMMENDATIONS 04/09/2019 Follow up Recommendations Inpatient Rehab   [facility] IP FREQUENCY AND DURATION 04/09/2019 Speech Therapy Frequency (ACUTE ONLY) min 2x/week Treatment Duration 2 weeks      [facility] IP ORAL PHASE 04/09/2019 Oral Phase Impaired Oral - Pudding Teaspoon -- Oral - Pudding Cup -- Oral - Honey Teaspoon -- Oral - Honey Cup --  Oral - Nectar Teaspoon Holding of bolus;Delayed oral transit;Lingual/palatal residue Oral - Nectar Cup Holding of bolus;Delayed oral transit;Lingual/palatal residue Oral - Nectar Straw Decreased bolus cohesion;Delayed oral transit Oral - Thin Teaspoon -- Oral - Thin Cup Holding of bolus;Premature spillage Oral - Thin Straw Delayed oral transit;Decreased bolus cohesion Oral - Puree Lingual pumping;Reduced posterior propulsion;Weak lingual manipulation;Lingual/palatal residue;Delayed oral transit Oral - Mech Soft Lingual pumping;Reduced posterior propulsion;Weak lingual manipulation;Lingual/palatal residue;Premature spillage;Delayed oral transit Oral - Regular -- Oral - Multi-Consistency -- Oral - Pill -- Oral Phase - Comment --  [facility] IP PHARYNGEAL PHASE 04/09/2019 Pharyngeal Phase Impaired Pharyngeal- Pudding Teaspoon -- Pharyngeal -- Pharyngeal- Pudding Cup -- Pharyngeal -- Pharyngeal- Honey Teaspoon -- Pharyngeal -- Pharyngeal- Honey Cup -- Pharyngeal -- Pharyngeal- Nectar Teaspoon Delayed swallow initiation-pyriform sinuses;Reduced epiglottic inversion;Reduced anterior laryngeal mobility;Reduced laryngeal elevation;Reduced tongue base  retraction;Pharyngeal residue - valleculae;Pharyngeal residue - pyriform Pharyngeal -- Pharyngeal- Nectar Cup Delayed swallow initiation-pyriform sinuses;Reduced epiglottic inversion;Reduced anterior laryngeal mobility;Reduced laryngeal elevation;Reduced tongue base retraction;Pharyngeal residue - valleculae;Pharyngeal residue - pyriform Pharyngeal -- Pharyngeal- Nectar Straw Delayed swallow initiation-pyriform sinuses;Reduced epiglottic inversion;Reduced anterior laryngeal mobility;Reduced laryngeal elevation;Reduced tongue base retraction;Pharyngeal residue - valleculae;Pharyngeal residue - pyriform;Penetration/Aspiration during swallow Pharyngeal Material enters airway, passes BELOW cords without attempt by patient to eject out (silent aspiration) Pharyngeal- Thin Teaspoon -- Pharyngeal -- Pharyngeal- Thin Cup Delayed swallow initiation-pyriform sinuses;Reduced epiglottic inversion;Reduced anterior laryngeal mobility;Reduced laryngeal elevation;Reduced tongue base retraction;Pharyngeal residue - valleculae;Pharyngeal residue - pyriform;Penetration/Aspiration during swallow Pharyngeal Material enters airway, passes BELOW cords without attempt by patient to eject out (silent aspiration) Pharyngeal- Thin Straw Delayed swallow initiation-pyriform sinuses;Reduced epiglottic inversion;Reduced anterior laryngeal mobility;Reduced laryngeal elevation;Reduced tongue base retraction;Pharyngeal residue - valleculae;Pharyngeal residue - pyriform;Penetration/Aspiration during swallow Pharyngeal Material enters airway, remains ABOVE vocal cords and not ejected out Pharyngeal- Puree Delayed swallow initiation-pyriform sinuses;Reduced epiglottic inversion;Reduced anterior laryngeal mobility;Reduced laryngeal elevation;Reduced tongue base retraction;Pharyngeal residue - valleculae;Pharyngeal residue - pyriform Pharyngeal -- Pharyngeal- Mechanical Soft Delayed swallow initiation-pyriform sinuses;Reduced epiglottic inversion;Reduced  anterior laryngeal mobility;Reduced laryngeal elevation;Reduced tongue base retraction;Pharyngeal residue - valleculae;Pharyngeal residue - pyriform Pharyngeal -- Pharyngeal- Regular -- Pharyngeal -- Pharyngeal- Multi-consistency -- Pharyngeal -- Pharyngeal- Pill -- Pharyngeal -- Pharyngeal Comment --  [facility] IP CERVICAL ESOPHAGEAL PHASE 04/09/2019 Cervical Esophageal Phase WFL Pudding Teaspoon -- Pudding Cup -- Honey Teaspoon -- Honey Cup -- Nectar Teaspoon -- Nectar Cup -- Nectar Straw -- Thin Teaspoon -- Thin Cup -- Thin Straw -- Puree -- Mechanical Soft -- Regular -- Multi-consistency -- Pill -- Cervical Esophageal Comment -- Venita Sheffield Nix 04/09/2019, 4:27 PM  Pollyann Glen, M.A. CCC-SLP Acute Rehabilitation Services Pager [phone] Office [phone]             [place] Ekg Site Rite  Result Date: 03/13/2019 If [facility] image not attached, placement could not be confirmed due to current cardiac rhythm.   DISCHARGE EXAMINATION: See progress note from earlier today for examination findings  DISPOSITION: [facility]     Current Inpatient Medications:  Scheduled:  [START ON 04/12/2019] amLODipine  5 mg Oral Daily   atorvastatin  40 mg Oral q1800   chlorhexidine gluconate (MEDLINE KIT)  15 mL Mouth Rinse BID   [START ON 04/12/2019] clopidogrel  75 mg Oral Daily   enoxaparin (LOVENOX) injection  40 mg Subcutaneous Q24H   insulin aspart  0-9 Units Subcutaneous TID WC   lidocaine  1 patch Transdermal Q24H   mouth rinse  15 mL Mouth Rinse BID   metoprolol tartrate  25 mg Oral BID   potassium  chloride  20 mEq Oral Daily   QUEtiapine  25 mg Oral QHS   sodium chloride flush  10-40 mL Intracatheter Q12H   Continuous:  sodium chloride Stopped (04/09/19 1414)   MMI:TVIFXG chloride, acetaminophen, haloperidol lactate, hydrALAZINE, [DISCONTINUED] ondansetron **OR** ondansetron (ZOFRAN) IV, Resource ThickenUp Clear, sodium chloride flush    TOTAL DISCHARGE TIME: 35  minutes  Maeven Mcdougall [employer] on [email or web address]  04/11/2019, 11:06 AM

## 2019-04-11 NOTE — H&P (Signed)
Physical Medicine and Rehabilitation Admission H&P    Chief Complaint  Patient presents with  . Debility.     HPI: Benjamin Cohen is a 63 year old male with history of CVA, T2DM and HTN who was admitted on 03/11/19 with fever, cough and SOB that developed after attending a wedding mid March with multiple family members who were diagnosed with COVID. CXR showed multifocal opacities and work up positive for COVID 19. He required intubation due to ARDS and was  started on Plaquenil, toclizamab X 1,  Ceftriaxone and Zithromax . He tolerated extubation by 4/12 but developed increase WOB despite HFNC and was reintubated  on 4/17. He was started on broad spectrum antibiotics for MRSA HCAP and moved to [facility].  He tolerated extubation by  4/20 and has completed course of diflucan as well as Vancomycin. Hospital course significant for delirium, few beats asymptomatic VT, dysphagia--has pulled out NGT multiple times. MBS done 4/29 and patient started on dysphagia 2,  Nectar liquids. He did sustain a fall 4/29--no injuries reported. Therapy ongoing and patient noted to have deficits in processing, hypophonia with decrease in verbal output, balance deficits as well as dysphagia. CIR recommended due to encephalopathy with debility.     Review of Systems  Respiratory: Positive for cough and shortness of breath.       Past Medical History:  Diagnosis Date  . Hypertension   . Stroke [facility])     Past Surgical History:  Procedure Laterality Date  . NO PAST SURGERIES      Family History  Problem Relation Age of Onset  . Cancer Mother   . Hypertension Brother     Social History:  reports that he has never smoked. He has never used smokeless tobacco. He reports that he does not drink alcohol or use drugs.    Allergies: No Known Allergies    Medications Prior to Admission  Medication Sig Dispense Refill  . atorvastatin (LIPITOR) 40 MG tablet Take 40 mg by mouth daily at 6 PM.    . clopidogrel  (PLAVIX) 75 MG tablet Take 75 mg by mouth daily.    Marland Kitchen glimepiride (AMARYL) 1 MG tablet Take 1 mg by mouth daily with breakfast.    . ibuprofen (ADVIL,MOTRIN) 200 MG tablet Take 200 mg by mouth every 6 (six) hours as needed for headache or moderate pain.    . metFORMIN (GLUCOPHAGE) 1000 MG tablet Take 1,000 mg by mouth 2 (two) times daily with a meal.    . blood glucose meter kit and supplies Dispense based on patient and insurance preference. Use up to four times daily as directed. (FOR ICD-10 E10.9, E11.9). 1 each 0    Drug Regimen Review  Drug regimen was reviewed and remains appropriate with no significant issues identified  Home: Home Living Family/patient expects to be discharged to:: Private residence Living Arrangements: Children Available Help at Discharge: Available 24 hours/day, Family Type of Home: House Home Access: Stairs to enter Technical brewer of Steps: 5 Entrance Stairs-Rails: Right, Left Home Layout: Two level, 1/2 bath on main level Alternate Level Stairs-Number of Steps: flight Alternate Level Stairs-Rails: Right Bathroom Shower/Tub: Chiropodist: Standard Bathroom Accessibility: Yes Home Equipment: None Additional Comments: Plan to go to [facility] to stay with Jacqulynn Cadet, x-wife   Functional History: Prior Function Level of Independence: Independent Comments: Drives; 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); likes to fish; enjoys playing golf  Functional Status:  Mobility:  Bed Mobility Overal bed mobility: Needs Assistance Bed Mobility: Supine to Sit Rolling: Supervision Sidelying to sit: Min assist Supine to sit: Min guard, HOB elevated Sit to supine: Mod assist Sit to sidelying: Min guard General bed mobility comments: sitting on bed edge w/OT Transfers Overall transfer level: Needs assistance Equipment used: Rolling walker (2 wheeled) Transfer via Lift Equipment: Stedy Transfers: Sit to/from [employer] to  Stand: Min assist, +2 safety/equipment Stand pivot transfers: Mod assist, +2 safety/equipment Squat pivot transfers: Max assist, +2 physical assistance General transfer comment: trunk forward but will stand erect with multimodal cues.; able to achieve upright posture frequent cues for posture. but unable to sustain do to poor attention Ambulation/Gait Ambulation/Gait assistance: Mod assist, +2 safety/equipment Gait Distance (Feet): 20 Feet(x 2) Assistive device: Rolling walker (2 wheeled) Gait Pattern/deviations: Step-through pattern, Decreased step length - right, Decreased step length - left, Shuffle, Trunk flexed General Gait Details: multimodal cues for posture, improved advancement/swing  of the left leg/ patient requires cues to turn and back up fully to surface. All aspectss of mobility are sluggish. Gait velocity: decreased Gait velocity interpretation: <1.31 ft/sec, indicative of household ambulator    ADL: ADL Overall ADL's : Needs assistance/impaired Eating/Feeding: Minimal assistance, Maximal assistance, Sitting Eating/Feeding Details (indicate cue type and reason): Performed self feeding with SLP present for swallowing.  Grooming: Minimal assistance, Standing Grooming Details (indicate cue type and reason): standing at sink to brush teeth; cues to turn off water; sequenced task without cues Upper Body Bathing: Maximal assistance, Sitting Upper Body Bathing Details (indicate cue type and reason): Pt requiring Max A for sitting balance at EOB during bathing. Pt requiring Max A to complete bathing task due to decreased processing and balance.  Lower Body Bathing: Moderate assistance, Sit to/from stand Lower Body Bathing Details (indicate cue type and reason): Pt standing with use of stedy and maintaining standing for second person to complete LB bathing Upper Body Dressing : Total assistance, Sitting Lower Body Dressing: Minimal assistance, Sit to/from stand(for socks) Lower Body  Dressing Details (indicate cue type and reason): Sitting EOB to donn socks; does better at bed level Toilet Transfer: Moderate assistance, RW(ambulating) Toilet Transfer Details (indicate cue type and reason): Ambulated to toilet; unsafely trying to sit before completly reaching toilet; feel fatigue factoring  in performance Toileting- Clothing Manipulation and Hygiene: Moderate assistance Toileting - Clothing Manipulation Details (indicate cue type and reason): incontinenet of urine at end of session; Max A to clean self Functional mobility during ADLs: Rolling walker, +2 for physical assistance, Minimal assistance General ADL Comments: Min-Mod A +2 for balance and safety abulate ~10 feet to sink and then sit in recliner once finished with grooming. Pt tolerating standing at sink for ~ 15 minutes. Pt highly motivated to particiapte in therapy. He also enjoyed listening to one of his favorite artists "Sage". At times, pt becoming distracted by music and requiring quiet environement to follow cues and participate in problem solving  Cognition: Cognition Overall Cognitive Status: Impaired/Different from baseline Arousal/Alertness: Awake/alert Orientation Level: Oriented to person, Oriented to place, Oriented to situation Attention: Sustained Sustained Attention: Impaired Sustained Attention Impairment: Verbal basic Memory: Impaired Memory Impairment: Storage deficit, Retrieval deficit, Decreased recall of new information Awareness: Impaired Awareness Impairment: Intellectual impairment Cognition Arousal/Alertness: Awake/alert Behavior During Therapy: Flat affect, Restless, Impulsive Overall Cognitive Status: Impaired/Different from baseline Area of Impairment: Orientation, Attention, Memory, Following commands, Safety/judgement, Awareness, Problem solving Orientation Level: Disoriented to, Time Current Attention Level: Sustained Memory: Decreased recall of precautions, Decreased  short-term  memory Following Commands: Follows one step commands consistently Safety/Judgement: Decreased awareness of safety, Decreased awareness of deficits Awareness: Emergent Problem Solving: Slow processing, Difficulty sequencing, Decreased initiation, Requires verbal cues, Requires tactile cues General Comments: Continues to improve, however, attention limiting performance adn requires multiple redirectional cues in minimally distracting environemtn Difficult to assess due to: (Soft spoken)  Physical Exam: Blood pressure 129/86, pulse (!) 105, temperature 98.9 F (37.2 C), temperature source Oral, resp. rate (!) 22, height 5' 7"  (1.702 m), weight 101.3 kg, SpO2 94 %. Physical Exam  Constitutional: He appears well-developed.  HENT:  Head: Normocephalic.  Eyes: Pupils are equal, round, and reactive to light.  Neck: Normal range of motion.  Cardiovascular: Normal rate.  Respiratory: Effort normal.  GI: Soft.  Musculoskeletal:        General: No edema.  Neurological: He is alert.  Oriented to person, hospital. Delays in processing. Follows simple commands. Moves all 4's  Psychiatric: He has a normal mood and affect.    Results for orders placed or performed during the hospital encounter of 03/11/19 (from the past 48 hour(s))  Glucose, capillary     Status: Abnormal   Collection Time: 04/09/19 12:43 PM  Result Value Ref Range   Glucose-Capillary 150 (H) 70 - 99 mg/dL  Glucose, capillary     Status: Abnormal   Collection Time: 04/09/19  4:50 PM  Result Value Ref Range   Glucose-Capillary 142 (H) 70 - 99 mg/dL  Glucose, capillary     Status: Abnormal   Collection Time: 04/09/19  7:49 PM  Result Value Ref Range   Glucose-Capillary 141 (H) 70 - 99 mg/dL  Glucose, capillary     Status: Abnormal   Collection Time: 04/09/19 11:16 PM  Result Value Ref Range   Glucose-Capillary 143 (H) 70 - 99 mg/dL  Glucose, capillary     Status: Abnormal   Collection Time: 04/10/19  3:31 AM  Result Value  Ref Range   Glucose-Capillary 126 (H) 70 - 99 mg/dL  Novel Coronavirus, NAA (hospital order; send-out to ref lab)     Status: None   Collection Time: 04/10/19  3:37 AM  Result Value Ref Range   SARS-CoV-2, NAA NOT DETECTED NOT DETECTED    Comment: (NOTE) This test was developed and its performance characteristics determined by [employer]. This test has not been FDA cleared or approved. This test has been authorized by FDA under an Emergency Use Authorization (EUA). This test has been validated in accordance with the FDA's Guidance Document (Policy for [place] in Laboratories Certified to Perform High Complexity Testing under CLIA prior to Emergency Use Authorization for Coronavirus [id number] during the [facility] Emergency) issued on February 29th, 2020. FDA independent review of this validation is pending. This test is only authorized for the duration of time the declaration that circumstances exist justifying the authorization of the emergency use of in vitro diagnostic tests for detection of SARS-CoV- 2 virus and/or diagnosis of COVID-19 infection under section 564(b)(1) of the Act, 21 U.S.C. 384TXM-4(W)(8), unless the authorization is terminated or revoked sooner. Performed At: [facility] [place], [place] [id number] Rush Farmer MD NO:[id number]    Coronavirus Source NASOPHARYNGEAL     Comment: Performed at [facility] [address], [place], [place] [address]  Basic metabolic panel     Status: Abnormal   Collection Time: 04/10/19  5:32 AM  Result Value Ref Range   Sodium 140 135 - 145 mmol/L  Potassium 3.5 3.5 - 5.1 mmol/L   Chloride 108 98 - 111 mmol/L   CO2 25 22 - 32 mmol/L   Glucose, Bld 140 (H) 70 - 99 mg/dL   BUN 7 (L) 8 - 23 mg/dL   Creatinine, Ser 0.85 0.61 - 1.24 mg/dL   Calcium 8.7 (L) 8.9 - 10.3 mg/dL   GFR calc non Af Amer >60 >60 mL/min   GFR calc Af Amer >60 >60 mL/min   Anion  gap 7 5 - 15    Comment: Performed at [facility], [place] [address] [place], [place] [address]  Magnesium     Status: None   Collection Time: 04/10/19  5:32 AM  Result Value Ref Range   Magnesium 1.8 1.7 - 2.4 mg/dL    Comment: Performed at [facility], [place] [address] [place], [place] [address]  Glucose, capillary     Status: Abnormal   Collection Time: 04/10/19  8:17 AM  Result Value Ref Range   Glucose-Capillary 154 (H) 70 - 99 mg/dL  Glucose, capillary     Status: Abnormal   Collection Time: 04/10/19 12:05 PM  Result Value Ref Range   Glucose-Capillary 217 (H) 70 - 99 mg/dL  Glucose, capillary     Status: Abnormal   Collection Time: 04/10/19  4:09 PM  Result Value Ref Range   Glucose-Capillary 154 (H) 70 - 99 mg/dL  Glucose, capillary     Status: Abnormal   Collection Time: 04/10/19  9:47 PM  Result Value Ref Range   Glucose-Capillary 145 (H) 70 - 99 mg/dL  Basic metabolic panel     Status: Abnormal   Collection Time: 04/11/19  4:25 AM  Result Value Ref Range   Sodium 139 135 - 145 mmol/L   Potassium 3.6 3.5 - 5.1 mmol/L   Chloride 107 98 - 111 mmol/L   CO2 23 22 - 32 mmol/L   Glucose, Bld 157 (H) 70 - 99 mg/dL   BUN 10 8 - 23 mg/dL   Creatinine, Ser 0.78 0.61 - 1.24 mg/dL   Calcium 8.2 (L) 8.9 - 10.3 mg/dL   GFR calc non Af Amer >60 >60 mL/min   GFR calc Af Amer >60 >60 mL/min   Anion gap 9 5 - 15    Comment: Performed at [facility], [place] [address], [place], [place] [address]  Glucose, capillary     Status: Abnormal   Collection Time: 04/11/19  8:05 AM  Result Value Ref Range   Glucose-Capillary 124 (H) 70 - 99 mg/dL   Dg Swallowing Func-speech Pathology  Result Date: 04/09/2019 Objective Swallowing Evaluation: Type of Study: MBS-Modified Barium Swallow Study  Patient Details Name: AHMON TOSI MRN: [id number] Date of Birth: 09-Sep-1956 Today's Date: 04/09/2019 Time: SLP Start Time (ACUTE ONLY): 1145 -SLP Stop  Time (ACUTE ONLY): 1235 SLP Time Calculation (min) (ACUTE ONLY): 50 min Past Medical History: Past Medical History: Diagnosis Date . Hypertension  . Stroke [facility])  Past Surgical History: Past Surgical History: Procedure Laterality Date . NO PAST SURGERIES   HPI: 63 y/o M admitted 3/31 COVID (+).Developed worsening hypoxemia, respiratory distress 4/1 and PCCM consulted for evaluation. Intubated 4/2 with ARDS.Was extubated 03/23/2019, with no oxygen requirement. Initial clinical swallow evaluation 4/13 revealed concerns for severe dysphagia; NPO was recommended, TF.  Pt developed respiratory distress and was reintubated on 03/28/2019; extubated 4/20. Hx prior strokes.  Subjective: alert Assessment / Plan / Recommendation CHL IP CLINICAL IMPRESSIONS 04/09/2019 Clinical Impression Pt  has a mild-moderate oropharyngeal dysphagia that appears to have more of an underlying neurological etiology, as compared to most typical post-extubation dysphagias. He has lingual pumping and slow transit. Mild lingual residue is present. When drinking thin or thickened liquids via straw he does not form cohesive boluses, more so letting liquids spill passively into the pharynx as he simlutaneously keeps sucking more liquid up via straw. Consequently, his pyriforms become more filled with liquids before the swallow, and he has small amounts of silent aspiration that comes posteriorly over the arytenoids. He forms a more cohesive bolus with cup sips of nectar thick liquids and solids, with smaller amounts of premature spillage. Hyolaryngeal movement, base of tongue retraction, and epiglottic inversion are reduced, so he has residue in his valleculae and pyriform sinuses, but this is reduced either with additional swallows (either Min cues for volitional ones or sometimes simply as he swallows the next bolus). Intermittent, trace amounts of penetration occurs with cup sips of nectar thick liquids, but this also clears well as he continues to  swallow. Recommend starting Dys 2 (chopped) diet and nectar thick liquids (no straws). 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 SLP Visit Diagnosis Dysphagia, oropharyngeal phase (R13.12) Attention and concentration deficit following -- Frontal lobe and executive function deficit following -- Impact on safety and function Mild aspiration risk;Moderate aspiration risk   CHL IP TREATMENT RECOMMENDATION 04/09/2019 Treatment Recommendations Therapy as outlined in treatment plan below   Prognosis 04/09/2019 Prognosis for Safe Diet Advancement Good Barriers to Reach Goals Cognitive deficits;Time post onset;Severity of deficits Barriers/Prognosis Comment -- CHL IP DIET RECOMMENDATION 04/09/2019 SLP Diet Recommendations Dysphagia 2 (Fine chop) solids;Nectar thick liquid Liquid Administration via Cup;No straw Medication Administration Whole meds with puree Compensations Minimize environmental distractions;Slow rate;Small sips/bites;Multiple dry swallows after each bite/sip Postural Changes Seated upright at 90 degrees   CHL IP OTHER RECOMMENDATIONS 04/09/2019 Recommended Consults -- Oral Care Recommendations Oral care BID Other Recommendations Order thickener from pharmacy;Prohibited food (jello, ice cream, thin soups);Remove water pitcher   CHL IP FOLLOW UP RECOMMENDATIONS 04/09/2019 Follow up Recommendations Inpatient Rehab   CHL IP FREQUENCY AND DURATION 04/09/2019 Speech Therapy Frequency (ACUTE ONLY) min 2x/week Treatment Duration 2 weeks      CHL IP ORAL PHASE 04/09/2019 Oral Phase Impaired Oral - Pudding Teaspoon -- Oral - Pudding Cup -- Oral - Honey Teaspoon -- Oral - Honey Cup -- Oral - Nectar Teaspoon Holding of bolus;Delayed oral transit;Lingual/palatal residue Oral - Nectar Cup Holding of bolus;Delayed oral transit;Lingual/palatal  residue Oral - Nectar Straw Decreased bolus cohesion;Delayed oral transit Oral - Thin Teaspoon -- Oral - Thin Cup Holding of bolus;Premature spillage Oral - Thin Straw Delayed oral transit;Decreased bolus cohesion Oral - Puree Lingual pumping;Reduced posterior propulsion;Weak lingual manipulation;Lingual/palatal residue;Delayed oral transit Oral - Mech Soft Lingual pumping;Reduced posterior propulsion;Weak lingual manipulation;Lingual/palatal residue;Premature spillage;Delayed oral transit Oral - Regular -- Oral - Multi-Consistency -- Oral - Pill -- Oral Phase - Comment --  CHL IP PHARYNGEAL PHASE 04/09/2019 Pharyngeal Phase Impaired Pharyngeal- Pudding Teaspoon -- Pharyngeal -- Pharyngeal- Pudding Cup -- Pharyngeal -- Pharyngeal- Honey Teaspoon -- Pharyngeal -- Pharyngeal- Honey Cup -- Pharyngeal -- Pharyngeal- Nectar Teaspoon Delayed swallow initiation-pyriform sinuses;Reduced epiglottic inversion;Reduced anterior laryngeal mobility;Reduced laryngeal elevation;Reduced tongue base retraction;Pharyngeal residue - valleculae;Pharyngeal residue - pyriform Pharyngeal -- Pharyngeal- Nectar Cup Delayed swallow initiation-pyriform  sinuses;Reduced epiglottic inversion;Reduced anterior laryngeal mobility;Reduced laryngeal elevation;Reduced tongue base retraction;Pharyngeal residue - valleculae;Pharyngeal residue - pyriform Pharyngeal -- Pharyngeal- Nectar Straw Delayed swallow initiation-pyriform sinuses;Reduced epiglottic inversion;Reduced anterior laryngeal mobility;Reduced laryngeal elevation;Reduced tongue base retraction;Pharyngeal residue - valleculae;Pharyngeal residue - pyriform;Penetration/Aspiration during swallow Pharyngeal Material enters airway, passes BELOW cords without attempt by patient to eject out (silent aspiration) Pharyngeal- Thin Teaspoon -- Pharyngeal -- Pharyngeal- Thin Cup Delayed swallow initiation-pyriform sinuses;Reduced epiglottic inversion;Reduced anterior laryngeal mobility;Reduced laryngeal  elevation;Reduced tongue base retraction;Pharyngeal residue - valleculae;Pharyngeal residue - pyriform;Penetration/Aspiration during swallow Pharyngeal Material enters airway, passes BELOW cords without attempt by patient to eject out (silent aspiration) Pharyngeal- Thin Straw Delayed swallow initiation-pyriform sinuses;Reduced epiglottic inversion;Reduced anterior laryngeal mobility;Reduced laryngeal elevation;Reduced tongue base retraction;Pharyngeal residue - valleculae;Pharyngeal residue - pyriform;Penetration/Aspiration during swallow Pharyngeal Material enters airway, remains ABOVE vocal cords and not ejected out Pharyngeal- Puree Delayed swallow initiation-pyriform sinuses;Reduced epiglottic inversion;Reduced anterior laryngeal mobility;Reduced laryngeal elevation;Reduced tongue base retraction;Pharyngeal residue - valleculae;Pharyngeal residue - pyriform Pharyngeal -- Pharyngeal- Mechanical Soft Delayed swallow initiation-pyriform sinuses;Reduced epiglottic inversion;Reduced anterior laryngeal mobility;Reduced laryngeal elevation;Reduced tongue base retraction;Pharyngeal residue - valleculae;Pharyngeal residue - pyriform Pharyngeal -- Pharyngeal- Regular -- Pharyngeal -- Pharyngeal- Multi-consistency -- Pharyngeal -- Pharyngeal- Pill -- Pharyngeal -- Pharyngeal Comment --  CHL IP CERVICAL ESOPHAGEAL PHASE 04/09/2019 Cervical Esophageal Phase WFL Pudding Teaspoon -- Pudding Cup -- Honey Teaspoon -- Honey Cup -- Nectar Teaspoon -- Nectar Cup -- Nectar Straw -- Thin Teaspoon -- Thin Cup -- Thin Straw -- Puree -- Mechanical Soft -- Regular -- Multi-consistency -- Pill -- Cervical Esophageal Comment -- Venita Sheffield Nix 04/09/2019, 4:27 PM  Pollyann Glen, M.A. [facility] Pager [phone] Office [phone]                 Medical Problem List and Plan: 1.  Functional deficits and impaired mobility secondary to debility/encephalopathy  -admit to inpatient rehab 2.  Antithrombotics:  -DVT/anticoagulation:  Pharmaceutical: Lovenox  -antiplatelet therapy: Plavix 3. Pain Management: tylenol prn 4. Mood: LCSW to follow for evaluation when appropriate.   -antipsychotic agents: Continue seroquel at bedtime. Will d/c haldol and adde Seroquel prn 5. Neuropsych: This patient is not capable of making decisions on his own behalf. 6. Skin/Wound Care: routine pressure relief measures 7. Fluids/Electrolytes/Nutrition: Monitor I/O. Check lytes in am. Now on diet-- 8. Metabolic encephalopathy/Delirium: demonstrating improvement  -sleep-wake chart 9. HTN: Monitor BP bid --continue metoprolol for BP/HR control.   10. H/o CVA: On  Plavix.  11. T2DM: Hgb A1c- 12.4 --has history of non-compliance per records.   12. Dysphagia: On dysphagia 2, nectar liquids. Add full supervision for safety with intake.        Bary Leriche, PA-C 04/11/2019

## 2019-04-11 NOTE — H&P (Signed)
Physical Medicine and Rehabilitation Admission H&P        Chief Complaint  Patient presents with   Debility.       HPI: Benjamin Cohen is a 63 year old male with history of CVA, T2DM and HTN who was admitted on 03/11/19 with fever, cough and SOB that developed after attending a wedding mid March with multiple family members who were diagnosed with COVID. CXR showed multifocal opacities and work up positive for COVID 19. He required intubation due to ARDS and was  started on Plaquenil, toclizamab X 1,  Ceftriaxone and Zithromax . He tolerated extubation by 4/12 but developed increase WOB despite HFNC and was reintubated  on 4/17. He was started on broad spectrum antibiotics for MRSA HCAP and moved to [facility].  He tolerated extubation by  4/20 and has completed course of diflucan as well as Vancomycin. Hospital course significant for delirium, few beats asymptomatic VT, dysphagia--has pulled out NGT multiple times. MBS done 4/29 and patient started on dysphagia 2,  Nectar liquids. He did sustain a fall 4/29--no injuries reported. Therapy ongoing and patient noted to have deficits in processing, hypophonia with decrease in verbal output, balance deficits as well as dysphagia. CIR recommended due to encephalopathy with debility.      Review of Systems  Respiratory: Positive for cough and shortness of breath.             Past Medical History:  Diagnosis Date   Hypertension     Stroke [facility])             Past Surgical History:  Procedure Laterality Date   NO PAST SURGERIES               Family History  Problem Relation Age of Onset   Cancer Mother     Hypertension Brother        Social History:  reports that he has never smoked. He has never used smokeless tobacco. He reports that he does not drink alcohol or use drugs.      Allergies: No Known Allergies            Medications Prior to Admission  Medication Sig Dispense Refill   atorvastatin (LIPITOR) 40 MG tablet  Take 40 mg by mouth daily at 6 PM.       clopidogrel (PLAVIX) 75 MG tablet Take 75 mg by mouth daily.       glimepiride (AMARYL) 1 MG tablet Take 1 mg by mouth daily with breakfast.       ibuprofen (ADVIL,MOTRIN) 200 MG tablet Take 200 mg by mouth every 6 (six) hours as needed for headache or moderate pain.       metFORMIN (GLUCOPHAGE) 1000 MG tablet Take 1,000 mg by mouth 2 (two) times daily with a meal.       blood glucose meter kit and supplies Dispense based on patient and insurance preference. Use up to four times daily as directed. (FOR ICD-10 E10.9, E11.9). 1 each 0      Drug Regimen Review  Drug regimen was reviewed and remains appropriate with no significant issues identified   Home: Home Living Family/patient expects to be discharged to:: Private residence Living Arrangements: Children Available Help at Discharge: Available 24 hours/day, Family Type of Home: House Home Access: Stairs to enter Technical brewer of Steps: 5 Entrance Stairs-Rails: Right, Left Home Layout: Two level, 1/2 bath on main level Alternate Level Stairs-Number of Steps: flight Alternate Level Stairs-Rails: Right  Bathroom Shower/Tub: Optometrist: Yes Home Equipment: None Additional Comments: Plan to go to [facility] to stay with Jacqulynn Cadet, x-wife   Functional History: Prior Function Level of Independence: Independent Comments: Drives; 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); likes to fish; enjoys Location manager Status:  Mobility: Bed Mobility Overal bed mobility: Needs Assistance Bed Mobility: Supine to Sit Rolling: Supervision Sidelying to sit: Min assist Supine to sit: Min guard, HOB elevated Sit to supine: Mod assist Sit to sidelying: Min guard General bed mobility comments: sitting on bed edge w/OT Transfers Overall transfer level: Needs assistance Equipment used: Rolling walker (2  wheeled) Transfer via Lift Equipment: Stedy Transfers: Sit to/from [employer] to Stand: Min assist, +2 safety/equipment Stand pivot transfers: Mod assist, +2 safety/equipment Squat pivot transfers: Max assist, +2 physical assistance General transfer comment: trunk forward but will stand erect with multimodal cues.; able to achieve upright posture frequent cues for posture. but unable to sustain do to poor attention Ambulation/Gait Ambulation/Gait assistance: Mod assist, +2 safety/equipment Gait Distance (Feet): 20 Feet(x 2) Assistive device: Rolling walker (2 wheeled) Gait Pattern/deviations: Step-through pattern, Decreased step length - right, Decreased step length - left, Shuffle, Trunk flexed General Gait Details: multimodal cues for posture, improved advancement/swing  of the left leg/ patient requires cues to turn and back up fully to surface. All aspectss of mobility are sluggish. Gait velocity: decreased Gait velocity interpretation: <1.31 ft/sec, indicative of household ambulator   ADL: ADL Overall ADL's : Needs assistance/impaired Eating/Feeding: Minimal assistance, Maximal assistance, Sitting Eating/Feeding Details (indicate cue type and reason): Performed self feeding with SLP present for swallowing.  Grooming: Minimal assistance, Standing Grooming Details (indicate cue type and reason): standing at sink to brush teeth; cues to turn off water; sequenced task without cues Upper Body Bathing: Maximal assistance, Sitting Upper Body Bathing Details (indicate cue type and reason): Pt requiring Max A for sitting balance at EOB during bathing. Pt requiring Max A to complete bathing task due to decreased processing and balance.  Lower Body Bathing: Moderate assistance, Sit to/from stand Lower Body Bathing Details (indicate cue type and reason): Pt standing with use of stedy and maintaining standing for second person to complete LB bathing Upper Body Dressing : Total assistance,  Sitting Lower Body Dressing: Minimal assistance, Sit to/from stand(for socks) Lower Body Dressing Details (indicate cue type and reason): Sitting EOB to donn socks; does better at bed level Toilet Transfer: Moderate assistance, RW(ambulating) Toilet Transfer Details (indicate cue type and reason): Ambulated to toilet; unsafely trying to sit before completly reaching toilet; feel fatigue factoring  in performance Toileting- Clothing Manipulation and Hygiene: Moderate assistance Toileting - Clothing Manipulation Details (indicate cue type and reason): incontinenet of urine at end of session; Max A to clean self Functional mobility during ADLs: Rolling walker, +2 for physical assistance, Minimal assistance General ADL Comments: Min-Mod A +2 for balance and safety abulate ~10 feet to sink and then sit in recliner once finished with grooming. Pt tolerating standing at sink for ~ 15 minutes. Pt highly motivated to particiapte in therapy. He also enjoyed listening to one of his favorite artists "Sage". At times, pt becoming distracted by music and requiring quiet environement to follow cues and participate in problem solving   Cognition: Cognition Overall Cognitive Status: Impaired/Different from baseline Arousal/Alertness: Awake/alert Orientation Level: Oriented to person, Oriented to place, Oriented to situation Attention: Sustained Sustained Attention: Impaired Sustained Attention Impairment: Verbal basic Memory:  Impaired Memory Impairment: Storage deficit, Retrieval deficit, Decreased recall of new information Awareness: Impaired Awareness Impairment: Intellectual impairment Cognition Arousal/Alertness: Awake/alert Behavior During Therapy: Flat affect, Restless, Impulsive Overall Cognitive Status: Impaired/Different from baseline Area of Impairment: Orientation, Attention, Memory, Following commands, Safety/judgement, Awareness, Problem solving Orientation Level: Disoriented to, Time Current  Attention Level: Sustained Memory: Decreased recall of precautions, Decreased short-term memory Following Commands: Follows one step commands consistently Safety/Judgement: Decreased awareness of safety, Decreased awareness of deficits Awareness: Emergent Problem Solving: Slow processing, Difficulty sequencing, Decreased initiation, Requires verbal cues, Requires tactile cues General Comments: Continues to improve, however, attention limiting performance adn requires multiple redirectional cues in minimally distracting environemtn Difficult to assess due to: (Soft spoken)   Physical Exam: Blood pressure 129/86, pulse (!) 105, temperature 98.9 F (37.2 C), temperature source Oral, resp. rate (!) 22, height _0  (1.702 m), weight 101.3 kg, SpO2 94 %. Physical Exam  Constitutional: He appears well-developed.  HENT:  Head: Normocephalic.  Eyes: Pupils are equal, round, and reactive to light.  Neck: Normal range of motion.  Cardiovascular: Normal rate.  Respiratory: Effort normal.  GI: Soft.  Musculoskeletal:        General: No edema.  Neurological: He is alert.  Oriented to person, hospital. Delays in processing. Follows simple commands. Moves all 4's  Psychiatric: He has a normal mood and affect.      Lab Results Last 48 Hours        Results for orders placed or performed during the hospital encounter of 03/11/19 (from the past 48 hour(s))  Glucose, capillary     Status: Abnormal    Collection Time: 04/09/19 12:43 PM  Result Value Ref Range    Glucose-Capillary 150 (H) 70 - 99 mg/dL  Glucose, capillary     Status: Abnormal    Collection Time: 04/09/19  4:50 PM  Result Value Ref Range    Glucose-Capillary 142 (H) 70 - 99 mg/dL  Glucose, capillary     Status: Abnormal    Collection Time: 04/09/19  7:49 PM  Result Value Ref Range    Glucose-Capillary 141 (H) 70 - 99 mg/dL  Glucose, capillary     Status: Abnormal    Collection Time: 04/09/19 11:16 PM  Result Value Ref Range     Glucose-Capillary 143 (H) 70 - 99 mg/dL  Glucose, capillary     Status: Abnormal    Collection Time: 04/10/19  3:31 AM  Result Value Ref Range    Glucose-Capillary 126 (H) 70 - 99 mg/dL  Novel Coronavirus, NAA (hospital order; send-out to ref lab)     Status: None    Collection Time: 04/10/19  3:37 AM  Result Value Ref Range    SARS-CoV-2, NAA NOT DETECTED NOT DETECTED      Comment: (NOTE) This test was developed and its performance characteristics determined by [employer]. This test has not been FDA cleared or approved. This test has been authorized by FDA under an Emergency Use Authorization (EUA). This test has been validated in accordance with the FDA's Guidance Document (Policy for [place] in Laboratories Certified to Perform High Complexity Testing under CLIA prior to Emergency Use Authorization for Coronavirus [id number]-2257 during the [facility] Emergency) issued on February 29th, 2020. FDA independent review of this validation is pending. This test is only authorized for the duration of time the declaration that circumstances exist justifying the authorization of the emergency use of in vitro diagnostic tests for detection of SARS-CoV- 2 virus and/or diagnosis of COVID-19  infection under section 564(b)(1) of the Act, 21 U.S.C. 144YJE-5(U)(3), unless the authorization is terminated or revoked sooner. Performed At: [facility] [place], [place] [id number] Rush Farmer MD CH:[id number]      Coronavirus Source NASOPHARYNGEAL        Comment: Performed at [facility] [address] [place], [place] [address]  Basic metabolic panel     Status: Abnormal    Collection Time: 04/10/19  5:32 AM  Result Value Ref Range    Sodium 140 135 - 145 mmol/L    Potassium 3.5 3.5 - 5.1 mmol/L    Chloride 108 98 - 111 mmol/L    CO2 25 22 - 32 mmol/L    Glucose, Bld 140 (H) 70 - 99 mg/dL    BUN 7 (L) 8 - 23 mg/dL     Creatinine, Ser 0.85 0.61 - 1.24 mg/dL    Calcium 8.7 (L) 8.9 - 10.3 mg/dL    GFR calc non Af Amer >60 >60 mL/min    GFR calc Af Amer >60 >60 mL/min    Anion gap 7 5 - 15      Comment: Performed at [facility], [place] [address], [place], [place] [address]  Magnesium     Status: None    Collection Time: 04/10/19  5:32 AM  Result Value Ref Range    Magnesium 1.8 1.7 - 2.4 mg/dL      Comment: Performed at [facility], [place] [address] [place], [place] [address]  Glucose, capillary     Status: Abnormal    Collection Time: 04/10/19  8:17 AM  Result Value Ref Range    Glucose-Capillary 154 (H) 70 - 99 mg/dL  Glucose, capillary     Status: Abnormal    Collection Time: 04/10/19 12:05 PM  Result Value Ref Range    Glucose-Capillary 217 (H) 70 - 99 mg/dL  Glucose, capillary     Status: Abnormal    Collection Time: 04/10/19  4:09 PM  Result Value Ref Range    Glucose-Capillary 154 (H) 70 - 99 mg/dL  Glucose, capillary     Status: Abnormal    Collection Time: 04/10/19  9:47 PM  Result Value Ref Range    Glucose-Capillary 145 (H) 70 - 99 mg/dL  Basic metabolic panel     Status: Abnormal    Collection Time: 04/11/19  4:25 AM  Result Value Ref Range    Sodium 139 135 - 145 mmol/L    Potassium 3.6 3.5 - 5.1 mmol/L    Chloride 107 98 - 111 mmol/L    CO2 23 22 - 32 mmol/L    Glucose, Bld 157 (H) 70 - 99 mg/dL    BUN 10 8 - 23 mg/dL    Creatinine, Ser 0.78 0.61 - 1.24 mg/dL    Calcium 8.2 (L) 8.9 - 10.3 mg/dL    GFR calc non Af Amer >60 >60 mL/min    GFR calc Af Amer >60 >60 mL/min    Anion gap 9 5 - 15      Comment: Performed at [facility], [place] [address], [place], [place] [address]  Glucose, capillary     Status: Abnormal    Collection Time: 04/11/19  8:05 AM  Result Value Ref Range    Glucose-Capillary 124 (H) 70 - 99 mg/dL       Imaging Results (Last 48 hours)  Dg Swallowing Func-speech Pathology   Result Date:  04/09/2019 Objective  Swallowing Evaluation: Type of Study: MBS-Modified Barium Swallow Study  Patient Details Name: Benjamin Cohen MRN: [id number] Date of Birth: 1956-09-21 Today's Date: 04/09/2019 Time: SLP Start Time (ACUTE ONLY): 1145 -SLP Stop Time (ACUTE ONLY): 1235 SLP Time Calculation (min) (ACUTE ONLY): 50 min Past Medical History: Past Medical History: Diagnosis Date  Hypertension   Stroke [facility])  Past Surgical History: Past Surgical History: Procedure Laterality Date  NO PAST SURGERIES   HPI: 63 y/o M admitted 3/31 COVID (+). Developed worsening hypoxemia, respiratory distress 4/1 and PCCM consulted for evaluation.  Intubated 4/2 with ARDS.  Was extubated 03/23/2019, with no oxygen requirement. Initial clinical swallow evaluation 4/13 revealed concerns for severe dysphagia; NPO was recommended, TF.  Pt developed respiratory distress and was reintubated on 03/28/2019; extubated 4/20. Hx prior strokes.  Subjective: alert Assessment / Plan / Recommendation CHL IP CLINICAL IMPRESSIONS 04/09/2019 Clinical Impression Pt has a mild-moderate oropharyngeal dysphagia that appears to have more of an underlying neurological etiology, as compared to most typical post-extubation dysphagias. He has lingual pumping and slow transit. Mild lingual residue is present. When drinking thin or thickened liquids via straw he does not form cohesive boluses, more so letting liquids spill passively into the pharynx as he simlutaneously keeps sucking more liquid up via straw. Consequently, his pyriforms become more filled with liquids before the swallow, and he has small amounts of silent aspiration that comes posteriorly over the arytenoids. He forms a more cohesive bolus with cup sips of nectar thick liquids and solids, with smaller amounts of premature spillage. Hyolaryngeal movement, base of tongue retraction, and epiglottic inversion are reduced, so he has residue in his valleculae and pyriform sinuses, but this is reduced either  with additional swallows (either Min cues for volitional ones or sometimes simply as he swallows the next bolus). Intermittent, trace amounts of penetration occurs with cup sips of nectar thick liquids, but this also clears well as he continues to swallow. Recommend starting Dys 2 (chopped) diet and nectar thick liquids (no straws). 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 SLP Visit Diagnosis Dysphagia, oropharyngeal phase (R13.12) Attention and concentration deficit following -- Frontal lobe and executive function deficit following -- Impact on safety and function Mild aspiration risk;Moderate aspiration risk   CHL IP TREATMENT RECOMMENDATION 04/09/2019 Treatment Recommendations Therapy as outlined in treatment plan below   Prognosis 04/09/2019 Prognosis for Safe Diet Advancement Good Barriers to Reach Goals Cognitive deficits;Time post onset;Severity of deficits Barriers/Prognosis Comment -- CHL IP DIET RECOMMENDATION 04/09/2019 SLP Diet Recommendations Dysphagia 2 (Fine chop) solids;Nectar thick liquid Liquid Administration via Cup;No straw Medication Administration Whole meds with puree Compensations Minimize environmental distractions;Slow rate;Small sips/bites;Multiple dry swallows after each bite/sip Postural Changes Seated upright at 90 degrees   CHL IP OTHER RECOMMENDATIONS 04/09/2019 Recommended Consults -- Oral Care Recommendations Oral care BID Other Recommendations Order thickener from pharmacy;Prohibited food (jello, ice cream, thin soups);Remove water pitcher   CHL IP FOLLOW UP RECOMMENDATIONS 04/09/2019 Follow up Recommendations Inpatient Rehab   CHL IP FREQUENCY AND DURATION 04/09/2019 Speech Therapy Frequency (ACUTE ONLY) min 2x/week Treatment Duration 2 weeks      CHL IP ORAL PHASE 04/09/2019 Oral Phase Impaired Oral -  Pudding Teaspoon -- Oral - Pudding Cup -- Oral - Honey Teaspoon -- Oral - Honey Cup -- Oral - Nectar Teaspoon Holding  of bolus;Delayed oral transit;Lingual/palatal residue Oral - Nectar Cup Holding of bolus;Delayed oral transit;Lingual/palatal residue Oral - Nectar Straw Decreased bolus cohesion;Delayed oral transit Oral - Thin Teaspoon -- Oral - Thin Cup Holding of bolus;Premature spillage Oral - Thin Straw Delayed oral transit;Decreased bolus cohesion Oral - Puree Lingual pumping;Reduced posterior propulsion;Weak lingual manipulation;Lingual/palatal residue;Delayed oral transit Oral - Mech Soft Lingual pumping;Reduced posterior propulsion;Weak lingual manipulation;Lingual/palatal residue;Premature spillage;Delayed oral transit Oral - Regular -- Oral - Multi-Consistency -- Oral - Pill -- Oral Phase - Comment --  CHL IP PHARYNGEAL PHASE 04/09/2019 Pharyngeal Phase Impaired Pharyngeal- Pudding Teaspoon -- Pharyngeal -- Pharyngeal- Pudding Cup -- Pharyngeal -- Pharyngeal- Honey Teaspoon -- Pharyngeal -- Pharyngeal- Honey Cup -- Pharyngeal -- Pharyngeal- Nectar Teaspoon Delayed swallow initiation-pyriform sinuses;Reduced epiglottic inversion;Reduced anterior laryngeal mobility;Reduced laryngeal elevation;Reduced tongue base retraction;Pharyngeal residue - valleculae;Pharyngeal residue - pyriform Pharyngeal -- Pharyngeal- Nectar Cup Delayed swallow initiation-pyriform sinuses;Reduced epiglottic inversion;Reduced anterior laryngeal mobility;Reduced laryngeal elevation;Reduced tongue base retraction;Pharyngeal residue - valleculae;Pharyngeal residue - pyriform Pharyngeal -- Pharyngeal- Nectar Straw Delayed swallow initiation-pyriform sinuses;Reduced epiglottic inversion;Reduced anterior laryngeal mobility;Reduced laryngeal elevation;Reduced tongue base retraction;Pharyngeal residue - valleculae;Pharyngeal residue - pyriform;Penetration/Aspiration during swallow Pharyngeal Material enters airway, passes BELOW cords without  attempt by patient to eject out (silent aspiration) Pharyngeal- Thin Teaspoon -- Pharyngeal -- Pharyngeal- Thin Cup Delayed swallow initiation-pyriform sinuses;Reduced epiglottic inversion;Reduced anterior laryngeal mobility;Reduced laryngeal elevation;Reduced tongue base retraction;Pharyngeal residue - valleculae;Pharyngeal residue - pyriform;Penetration/Aspiration during swallow Pharyngeal Material enters airway, passes BELOW cords without attempt by patient to eject out (silent aspiration) Pharyngeal- Thin Straw Delayed swallow initiation-pyriform sinuses;Reduced epiglottic inversion;Reduced anterior laryngeal mobility;Reduced laryngeal elevation;Reduced tongue base retraction;Pharyngeal residue - valleculae;Pharyngeal residue - pyriform;Penetration/Aspiration during swallow Pharyngeal Material enters airway, remains ABOVE vocal cords and not ejected out Pharyngeal- Puree Delayed swallow initiation-pyriform sinuses;Reduced epiglottic inversion;Reduced anterior laryngeal mobility;Reduced laryngeal elevation;Reduced tongue base retraction;Pharyngeal residue - valleculae;Pharyngeal residue - pyriform Pharyngeal -- Pharyngeal- Mechanical Soft Delayed swallow initiation-pyriform sinuses;Reduced epiglottic inversion;Reduced anterior laryngeal mobility;Reduced laryngeal elevation;Reduced tongue base retraction;Pharyngeal residue - valleculae;Pharyngeal residue - pyriform Pharyngeal -- Pharyngeal- Regular -- Pharyngeal -- Pharyngeal- Multi-consistency -- Pharyngeal -- Pharyngeal- Pill -- Pharyngeal -- Pharyngeal Comment --  CHL IP CERVICAL ESOPHAGEAL PHASE 04/09/2019 Cervical Esophageal Phase WFL Pudding Teaspoon -- Pudding Cup -- Honey Teaspoon -- Honey Cup -- Nectar Teaspoon -- Nectar Cup -- Nectar Straw -- Thin Teaspoon -- Thin Cup -- Thin Straw -- Puree -- Mechanical Soft -- Regular -- Multi-consistency -- Pill -- Cervical Esophageal Comment -- Venita Sheffield Nix 04/09/2019, 4:27 PM  Pollyann Glen, M.A. [facility] Pager [phone] Office [phone]                       Medical Problem List and Plan: 1.  Functional deficits and impaired mobility secondary to debility/encephalopathy after COVID 19 infection and subsequent respiratory failure/ARDS             -admit to inpatient rehab  -pt has had 2 negative Covid tests 2 days aprat and has been afebrile for > 72 hours with antipyretics. Standard precautions apply in caring for this patient.  2.  Antithrombotics: -DVT/anticoagulation:  Pharmaceutical: Lovenox             -antiplatelet therapy: Plavix 3. Pain Management: tylenol prn 4. Mood: LCSW to follow for evaluation when appropriate.              -antipsychotic agents: Continue seroquel at bedtime. Will d/c haldol and adde Seroquel prn 5. Neuropsych: This patient is not capable  of making decisions on his own behalf. 6. Skin/Wound Care: routine pressure relief measures 7. Fluids/Electrolytes/Nutrition: Monitor I/O. Check lytes in am. Now on diet-- 8. Metabolic encephalopathy/Delirium: demonstrating improvement             -sleep-wake chart 9. HTN: Monitor BP bid --continue metoprolol for BP/HR control.   10. H/o CVA: On  Plavix.  11. T2DM: Hgb A1c- 12.4 --has history of non-compliance per records.   12. Dysphagia: On dysphagia 2, nectar liquids. Add full supervision for safety with intake.       Post Admission Physician Evaluation: 1. Functional deficits secondary  to debility . 2. Patient is admitted to receive collaborative, interdisciplinary care between the physiatrist, rehab nursing staff, and therapy team. 3. Patient's level of medical complexity and substantial therapy needs in context of that medical necessity cannot be provided at a lesser intensity of care such as a SNF. 4. Patient has experienced substantial functional loss from his/her baseline which was documented above under the "Functional History" and "Functional Status" headings.  Judging by the  patient's diagnosis, physical exam, and functional history, the patient has potential for functional progress which will result in measurable gains while on inpatient rehab.  These gains will be of substantial and practical use upon discharge  in facilitating mobility and self-care at the household level. 5. Physiatrist will provide 24 hour management of medical needs as well as oversight of the therapy plan/treatment and provide guidance as appropriate regarding the interaction of the two. 6. The Preadmission Screening has been reviewed and patient status is unchanged unless otherwise stated above. 7. 24 hour rehab nursing will assist with bladder management, bowel management, safety, skin/wound care, disease management, medication administration, pain management and patient education  and help integrate therapy concepts, techniques,education, etc. 8. PT will assess and treat for/with: Lower extremity strength, range of motion, stamina, balance, functional mobility, safety, adaptive techniques and equipment, NMR.   Goals are: supervision. 9. OT will assess and treat for/with: ADL's, functional mobility, safety, upper extremity strength, adaptive techniques and equipment, NMR.   Goals are: supervision to min assist. Therapy may proceed with showering this patient. 10. SLP will assess and treat for/with: cognition, communication.  Goals are: supervision to min assist. 11. Case Management and Social Worker will assess and treat for psychological issues and discharge planning. 12. Team conference will be held weekly to assess progress toward goals and to determine barriers to discharge. 13. Patient will receive at least 3 hours of therapy per day at least 5 days per week. 14. ELOS: 16-20 days       15. Prognosis:  excellent   Meredith Staggers, MD, [facility] 04/11/2019       Bary Leriche, PA-C 04/11/2019

## 2019-04-11 NOTE — TOC Transition Note (Addendum)
Transition of Care [facility]) - CM/SW Discharge Note   Patient Details  Name: Benjamin Cohen MRN: [id number] Date of Birth: 25-Aug-1956  Transition of Care [facility]) CM/SW Contact:  Colleen Can RN, BSN, NCM-BC, ACM-RN [phone] (working remotely) Phone Number: 04/11/2019, 11:16 AM   Clinical Narrative:    Patient has received insurance approval for CIR, 2 negative COVID tests and is medically stable to transition to CIR today. [facility] arranged for transportation from [facility] to [facility] CIR. No further needs from CM.  Final next level of care: IP Rehab Facility Barriers to Discharge: No Barriers Identified   Patient Goals and CMS Choice Patient states their goals for this hospitalization and ongoing recovery are:: "to relocate with his ex-wife" CMS Medicare.gov Compare Post Acute Care list provided to:: Patient Choice offered to / list presented to : Patient(Choice and POC was discussed with the patient/ex-wife by the Rehab [facility])  Discharge Placement CIR                   Discharge Plan and Services In-house Referral: NA Discharge Planning Services: CM Consult Post Acute Care Choice: IP Rehab          DME Arranged: N/A DME Agency: NA       HH Arranged: NA HH Agency: NA

## 2019-04-11 NOTE — Progress Notes (Signed)
Benjamin Cohen  TKW:[id number] DOB: 24-Nov-1956 DOA: 03/11/2019 PCP: Filiberto Pinks    Brief Narrative:  63 y/o M admitted 3/31 with fever, cough, SOB and confusion. He attended a wedding mid March, after which multiple family members were diagnosed with COVID including his wife. He developed worsening hypoxemia w/ respiratory distress 4/1 and PCCM was consulted for evaluation. Intubated 4/2 with ARDS. Extubated 4/12. Had to be reintubated 4/17.  Upon further stability-he was transferred out of the ICU- post extubation course complicated by severe delirium, severe dysphagia requiring NG tube.  Unfortunately, patient has pulled out his NG tube repeatedly-speech therapy following closely to see if diet can be reinitiated-remains n.p.o.  Significant Events: 3/31 Admit, 2L, bilateral patchy infiltrates  4/01 PCCM consulted 4/02 Intubated 4/03 toclizamab x 1 4/05 Net positive, remains on paralytic, still prone 4/06 Supine, plateau pressure 23, 50% / 12 PEEP; complete 5 days of plaquenil 4/08 Plateau 23, PEEP 12 / 40% 4/14 transfer to floor bed 4/17 re-intubated   4/20 extubated 4/22 SLP eval > NPO 4/24 NG tube reinserted and NG feedings started 4/25 to 4/27>> repeatedly has pulled out NG tube-requiring reinsertion  COVID-19 specific Treatment: Actemra x1 Plaquenil 4/1 > 4/6   Ventilator Settings: NA - extubated 4/20  Subjective: Patient awake alert.  Mildly distracted.  Unknown if the patient slept overnight.  He is not able to tell me.  Eating breakfast this morning.  Following commands.     Assessment & Plan:  Acute Hypoxic Resp Failure - Covid 19 associated ARDS - MRSA Pneumonia:  Patient appears to be doing well.  His respiratory status is stable.  Saturating normal on room air.  He is afebrile.  He is completed a course of IV vancomycin.  Continue incentive spirometry.  Mobilize as tolerated.     Acute metabolic encephalopathy - ICU delirium:  Patient has improved  significantly though he still is distracted.  Continue low-dose Seroquel.  QTC has been normal.  Could consider increasing the dose of Seroquel if he continues to have agitation overnight.  Fall on 4/29 No injuries noted.  Continue to monitor.  Patient moving all his extremities.    Questionable NSVT Telemetry showed concern for NSVT although some of them appears to be artifact.  Patient noted to be tapping on his chest very frequently when I was in his room.  This could be the reason for the artifact.  Continue low-dose beta-blocker.  Dysphagia- post extubation Probably secondary to prolonged intubation, he also has a history of prior CVA.  Patient underwent modified barium swallow on 4/29 and was cleared for dysphagia to diet which he seems to be tolerating well.  Continue to monitor.  IV fluids were discontinued.  Continue to monitor CBGs.   Essential hypertension  Blood pressure reasonably well controlled on amlodipine and metoprolol.    History of CVA Continue Plavix and statin.  Stable.    Hypokalemia Potassium has improved.  Will order daily supplementation.  Magnesium was 1.8 yesterday and he was given magnesium as well.    DM2 CBGs are reasonably well controlled.  Only on SSI which will be continued.  HbA1c 12.4.  Patient was on glimepiride and metformin at home.  Normocytic anemia Secondary to acute illness.  Hemoglobin has remained stable.  No evidence of overt bleeding.    Nutrition Patient seems to be tolerating his dysphagia 2 diet well.    Deconditioning Secondary to acute/critical illness.  Patient being evaluated by [facility] inpatient rehab  department.  They will need 2 negative COVID-19 test results before they can take him.  First test came back negative.  Second test was sent yesterday morning.  Result is still pending.  [facility] was called and they do not have results as of this morning.    DVT prophylaxis: lovenox  Code Status: FULL CODE Family  Communication: Care being discussed with his ex-wife on a daily basis Disposition Plan: Evaluated by CIR.  However they need 2 negative COVID-19 tests.  Waiting on the second negative test.  Consultants:  PCCM  Antimicrobials:  Vanc 4/17 > 4/23  Objective: Blood pressure 129/86, pulse (!) 105, temperature 98.9 F (37.2 C), temperature source Oral, resp. rate (!) 22, height 5' 7"  (1.702 m), weight 101.3 kg, SpO2 94 %.  Intake/Output Summary (Last 24 hours) at 04/11/2019 1032 Last data filed at 04/11/2019 0856 Gross per 24 hour  Intake 480 ml  Output -  Net 480 ml   Filed Weights   04/07/19 0500 04/08/19 0405 04/10/19 0351  Weight: 101.7 kg 102 kg 101.3 kg    Examination:  General appearance: Awake alert.  In no distress.  Distracted.  Following commands. Resp: Clear to auscultation bilaterally.  Normal effort Cardio: S1-S2 is normal regular.  No S3-S4.  No rubs murmurs or bruit GI: Abdomen is soft.  Nontender nondistended.  Bowel sounds are present normal.  No masses organomegaly Extremities: No edema.  Full range of motion of lower extremities. Neurologic: He is alert.  Oriented to place year.  No obvious focal neurological deficits appreciated.    CBC: Recent Labs  Lab 04/05/19 0400 04/08/19 0400  WBC 6.7 4.8  HGB 10.1* 10.5*  HCT 33.4* 33.6*  MCV 86.8 85.7  PLT 353 388*   Basic Metabolic Panel: Recent Labs  Lab 04/09/19 0515 04/10/19 0532 04/11/19 0425  NA 139 140 139  K 3.4* 3.5 3.6  CL 105 108 107  CO2 24 25 23   GLUCOSE 138* 140* 157*  BUN 6* 7* 10  CREATININE 0.79 0.85 0.78  CALCIUM 8.6* 8.7* 8.2*  MG 1.9 1.8  --    GFR: Estimated Creatinine Clearance: 104.4 mL/min (by C-G formula based on SCr of 0.78 mg/dL).  HbA1C: Hgb A1c MFr Bld  Date/Time Value Ref Range Status  03/11/2019 03:38 PM 12.4 (H) 4.8 - 5.6 % Final    Comment:    (NOTE) Pre diabetes:          5.7%-6.4% Diabetes:              >6.4% Glycemic control for   <7.0% adults with  diabetes   04/30/2018 01:28 AM 11.5 (H) 4.8 - 5.6 % Final    Comment:    (NOTE) Pre diabetes:          5.7%-6.4% Diabetes:              >6.4% Glycemic control for   <7.0% adults with diabetes     CBG: Recent Labs  Lab 04/10/19 0817 04/10/19 1205 04/10/19 1609 04/10/19 2147 04/11/19 0805  GLUCAP 154* 217* 154* 145* 124*    Recent Results (from the past 240 hour(s))  Novel Coronavirus, NAA (hospital order; send-out to ref lab)     Status: None   Collection Time: 04/08/19  2:10 PM  Result Value Ref Range Status   SARS-CoV-2, NAA NOT DETECTED NOT DETECTED Final    Comment: (NOTE) This test was developed and its performance characteristics determined by [employer]. This test has not been FDA  cleared or approved. This test has been authorized by FDA under an Emergency Use Authorization (EUA). This test has been validated in accordance with the FDA's Guidance Document (Policy for [place] in Laboratories Certified to Perform High Complexity Testing under CLIA prior to Emergency Use Authorization for Coronavirus [id number] during the [facility] Emergency) issued on February 29th, 2020. FDA independent review of this validation is pending. This test is only authorized for the duration of time the declaration that circumstances exist justifying the authorization of the emergency use of in vitro diagnostic tests for detection of SARS-CoV- 2 virus and/or diagnosis of COVID-19 infection under section 564(b)(1) of the Act, 21 U.S.C. 832NVB-1(Y)(6), unless the authorization is terminated or revoked sooner. Performed At: [facility] [place], [place] [id number] Rush Farmer MD FS:[id number]    [place]  Final    Comment: Performed at [place] [address], [place], [place] [address]     Scheduled Meds: . amLODipine  5 mg Per Tube Daily  . atorvastatin  40 mg Per Tube q1800  .  chlorhexidine gluconate (MEDLINE KIT)  15 mL Mouth Rinse BID  . clopidogrel  75 mg Per Tube Daily  . enoxaparin (LOVENOX) injection  40 mg Subcutaneous Q24H  . insulin aspart  0-9 Units Subcutaneous TID WC  . lidocaine  1 patch Transdermal Q24H  . mouth rinse  15 mL Mouth Rinse BID  . metoprolol tartrate  25 mg Oral BID  . potassium chloride  20 mEq Oral Daily  . QUEtiapine  25 mg Oral QHS  . sodium chloride flush  10-40 mL Intracatheter Q12H   Continuous Infusions: . sodium chloride Stopped (04/09/19 1414)     LOS: 31 days   [place] [facility] Office  [phone] Pager - Text Page per Shea Eberly  If 7PM-7AM, please contact night-coverage per Amion 04/11/2019, 10:32 AM

## 2019-04-11 NOTE — Progress Notes (Signed)
Occupational Therapy Treatment Patient Details Name: Benjamin Cohen MRN: [id number] DOB: [birth year] Today's Date: 04/11/2019    History of present illness 63 y.o. male admitted for acute respiratory failure (+) COVID 19, s/p intubation 4/2-4/11, re-intubated 4/17-4/20/20.  Pt with significant PMH of CVA and HTN.     OT comments  Pt continues to improve daily. Apparent cognitive deficits, generalized weakness and L shoulder dysfunction impacting mobility and ADL. Pt requiring Min A for mobility at RW level and Min-mod A for ADL tasks. [facility]  Follow Up Recommendations  CIR;Supervision/Assistance - 24 hour    Equipment Recommendations  3 in 1 bedside commode    Recommendations for Other Services Rehab consult    Precautions / Restrictions Precautions Precautions: Fall Precaution Comments: High risk for falls.has a Personnel officersafety sitter       Mobility Bed Mobility Overal bed mobility: Needs Assistance Bed Mobility: Supine to Sit     Supine to sit: Supervision   General bed mobility comments: Supervision for safety, using gravity and momentum to get down to flat bed.   Transfers Overall transfer level: Needs assistance Equipment used: Rolling walker (2 wheeled) Transfers: Sit to/from Stand Sit to Stand: Min assist         General transfer comment: min A to transition weight anteriorly at times    Balance Overall balance assessment: Needs assistance Sitting-balance support: Feet supported;No upper extremity supported Sitting balance-Leahy Scale: Good   Standing balance support: Bilateral upper extremity supported;Single extremity supported Standing balance-Leahy Scale: Poor Standing balance comment: history of falls                           ADL either performed or assessed with clinical judgement   ADL Overall ADL's : Needs assistance/impaired     Grooming: Set up;Sitting   Upper Body Bathing: Supervision/  safety;Set up;Sitting   Lower Body Bathing: Moderate assistance;Sit to/from stand       Lower Body Dressing: Minimal assistance;Sit to/from stand   Toilet Transfer: Minimal assistance;RW;Ambulation;Comfort height toilet;Grab bars   Toileting- Clothing Manipulation and Hygiene: Moderate assistance Toileting - Clothing Manipulation Details (indicate cue type and reason): incontinenet of urine; underwear made with pads/mesh underwear     Functional mobility during ADLs: Minimal assistance;Rolling walker;Cueing for safety       Vision       Perception     Praxis      Cognition Arousal/Alertness: Awake/alert Behavior During Therapy: Impulsive;Restless Overall Cognitive Status: Impaired/Different from baseline Area of Impairment: Orientation;Attention;Safety/judgement;Awareness;Problem solving                 Orientation Level: Disoriented to;Time Current Attention Level: Sustained Memory: Decreased recall of precautions;Decreased short-term memory Following Commands: Follows one step commands consistently Safety/Judgement: Decreased awareness of safety;Decreased awareness of deficits Awareness: Emergent Problem Solving: Slow processing General Comments: Continues to improve, however decreaed awareness of deficits and imapinred judgement, reasoning. Impulsive and poor self monitoring skills; self stimulating behaviors at times - hitting self on the side of his head; slapping his leg        Exercises     Shoulder Instructions       General Comments Incontinent of urine upon arrival; perseverating on where his wallet is located - wallet found in his room.    Pertinent Vitals/ Pain       Pain Assessment: Faces Faces Pain Scale: Hurts even more Pain Location: L shoulder pain Pain Descriptors /  Indicators: Aching;Discomfort;Grimacing;Headache Pain Intervention(s): Limited activity within patient's tolerance  Home Living                                           Prior Functioning/Environment              Frequency  Min 3X/week        Progress Toward Goals  OT Goals(current goals can now be found in the care plan section)  Progress towards OT goals: Progressing toward goals  Acute Rehab OT Goals Patient Stated Goal: to find his wallet OT Goal Formulation: With patient Time For Goal Achievement: 04/15/19 Potential to Achieve Goals: Good ADL Goals Pt Will Perform Grooming: with modified independence;sitting Pt Will Perform Upper Body Bathing: with set-up;with supervision;sitting Pt Will Perform Lower Body Bathing: with min assist;sit to/from stand Pt Will Transfer to Toilet: bedside commode;with min assist;stand pivot transfer Additional ADL Goal #1: pt will perform UB adls with mod A seated Additional ADL Goal #2: pt will go from sit to stand with mod +2 assist for adls Additional ADL Goal #3: Pt will tolerate A/AAROM to bil UEs to strengthening arms for adls and toilet transfers  Plan Discharge plan remains appropriate    Co-evaluation                 AM-PAC OT "6 Clicks" Daily Activity     Outcome Measure   Help from another person eating meals?: A Little Help from another person taking care of personal grooming?: A Little Help from another person toileting, which includes using toliet, bedpan, or urinal?: A Lot Help from another person bathing (including washing, rinsing, drying)?: A Little Help from another person to put on and taking off regular upper body clothing?: A Lot Help from another person to put on and taking off regular lower body clothing?: A Lot 6 Click Score: 15    End of Session Equipment Utilized During Treatment: Gait belt;Rolling walker  OT Visit Diagnosis: Unsteadiness on feet (R26.81);Other abnormalities of gait and mobility (R26.89);Muscle weakness (generalized) (M62.81);History of falling (Z91.81);Other symptoms and signs involving cognitive function;Pain Pain - Right/Left:  Left Pain - part of body: Shoulder   Activity Tolerance Patient tolerated treatment well   Patient Left in chair;with call bell/phone within reach;with nursing/sitter in room   Nurse Communication Mobility status(toilet pt every hour)        Time: 7017-7939 OT Time Calculation (min): 38 min  Charges: OT General Charges $OT Visit: 1 Visit OT Treatments $Self Care/Home Management : 38-52 mins  Luisa Dago, OT/L   Acute OT Clinical Specialist Acute Rehabilitation Services Pager [phone] Office [phone]    [facility] 04/11/2019, 1:19 PM

## 2019-04-11 NOTE — Progress Notes (Signed)
Patient arrived to rehab unit via care link with all personal belongings. Patients belongings all placed in a biohazard bag and -placed in closet in patient room. Report received from care link nurse. Patient oriented to unit and call light in place.  Lorri Frederick, LPN

## 2019-04-11 NOTE — Progress Notes (Signed)
Physical Therapy Treatment Patient Details Name: Benjamin Cohen MRN: [id number] DOB: Nov 02, 1956 Today's Date: 04/11/2019    History of Present Illness 63 y.o. male admitted for acute respiratory failure (+) COVID 19, s/p intubation 4/2-4/11, re-intubated 4/17-4/20/20.  Pt with significant PMH of CVA and HTN.      PT Comments    Pt was able to tolerate gait with RW with mod assist today, however, he is so impulsive and has such poor safety awareness, I am wondering if the RW is more of an obstacle than an aide at this time.  It would be worth trying some gait without AD next session.  He remains appropriate for intensive multidisciplinary therapy and is due to d/c to CIR later today.   Follow Up Recommendations  CIR     Equipment Recommendations  Rolling walker with 5" wheels;3in1 (PT);Wheelchair (measurements PT);Wheelchair cushion (measurements PT)    Recommendations for Other Services   NA     Precautions / Restrictions Precautions Precautions: Fall Precaution Comments: High risk for falls.has a Designer, industrial/product  Bed Mobility Overal bed mobility: Needs Assistance Bed Mobility: Sit to Supine       Sit to supine: Supervision   General bed mobility comments: Supervision for safety, using gravity and momentum to get down to flat bed.   Transfers Overall transfer level: Needs assistance Equipment used: Rolling walker (2 wheeled) Transfers: Sit to/from Stand Sit to Stand: Min assist         General transfer comment: Min assist from low recliner chair.  Verbal cues and manual assist for safe hand placement.  Repetition needed with each stand and pt was not able to carryover information.    Ambulation/Gait Ambulation/Gait assistance: Mod assist Gait Distance (Feet): 20 Feet Assistive device: Rolling walker (2 wheeled) Gait Pattern/deviations: Step-through pattern;Decreased step length - right;Decreased step length - left;Shuffle;Trunk flexed Gait velocity:  decreased   General Gait Details: Pt impulsive, letting go of walker, turning with feet outside of device, falling over without awareness or attempts to re-balance himself.  Max verbal cues for safety, mod assist at trunk for balance.  It makes me wonder if he would do better without an AD at this time.           Balance Overall balance assessment: Needs assistance Sitting-balance support: Feet supported;No upper extremity supported Sitting balance-Leahy Scale: Good Sitting balance - Comments: close supervision to donn socks seated.  Leaning forward first, then assist to bring leg up to cross it.     Standing balance support: Bilateral upper extremity supported;Single extremity supported Standing balance-Leahy Scale: Poor Standing balance comment: needs up to mod assist for balance in standing.                             Cognition Arousal/Alertness: Awake/alert Behavior During Therapy: Impulsive;Restless Overall Cognitive Status: Impaired/Different from baseline Area of Impairment: Orientation;Attention;Memory;Following commands;Safety/judgement;Awareness;Problem solving                 Orientation Level: Time Current Attention Level: Sustained(very distracted by TV and sitter in room) Memory: Decreased recall of precautions;Decreased short-term memory Following Commands: Follows one step commands consistently;Follows one step commands with increased time Safety/Judgement: Decreased awareness of safety;Decreased awareness of deficits Awareness: Emergent Problem Solving: Slow processing;Difficulty sequencing;Requires verbal cues;Requires tactile cues General Comments: Pt improved, but perseverative today when told he may leave to go to CIR.  He kept wanting to get dressed, asking  for his bag, needing re-direction to task.           General Comments General comments (skin integrity, edema, etc.): mulitiple episodes of incontinent urine before gait requiring 4  sit to stands and clean ups.  Pt unable to report he needs to go and unable to go into urinal in sitting.  Standing was successful, but still messy as he started to urinate before urinal was in place.        Pertinent Vitals/Pain Pain Assessment: Faces Faces Pain Scale: Hurts even more Pain Location: low back pain reported Pain Descriptors / Indicators: Grimacing;Guarding Pain Intervention(s): Limited activity within patient's tolerance;Monitored during session;Repositioned           PT Goals (current goals can now be found in the care plan section) Acute Rehab PT Goals Patient Stated Goal: pt serseverating on getting dressed today Progress towards PT goals: Progressing toward goals    Frequency    Min 4X/week      PT Plan Current plan remains appropriate       AM-PAC PT "6 Clicks" Mobility   Outcome Measure  Help needed turning from your back to your side while in a flat bed without using bedrails?: A Little Help needed moving from lying on your back to sitting on the side of a flat bed without using bedrails?: A Little Help needed moving to and from a bed to a chair (including a wheelchair)?: A Little Help needed standing up from a chair using your arms (e.g., wheelchair or bedside chair)?: A Little Help needed to walk in hospital room?: A Lot Help needed climbing 3-5 steps with a railing? : Total 6 Click Score: 15    End of Session Equipment Utilized During Treatment: Gait belt Activity Tolerance: Patient tolerated treatment well Patient left: in bed;with call bell/phone within reach;with bed alarm set;with nursing/sitter in room   PT Visit Diagnosis: Unsteadiness on feet (R26.81)     Time: [id number] PT Time Calculation (min) (ACUTE ONLY): 42 min  Charges:  $Gait Training: 8-22 mins $Therapeutic Activity: 23-37 mins                    Benjamin Cohen, PT, DPT  Acute Rehabilitation [phone] pager #[phone] office   04/11/2019, 12:05  PM

## 2019-04-12 ENCOUNTER — Inpatient Hospital Stay (HOSPITAL_COMMUNITY): Payer: 59

## 2019-04-12 ENCOUNTER — Inpatient Hospital Stay (HOSPITAL_COMMUNITY): Payer: 59 | Admitting: Speech Pathology

## 2019-04-12 DIAGNOSIS — G9341 Metabolic encephalopathy: Secondary | ICD-10-CM

## 2019-04-12 DIAGNOSIS — I10 Essential (primary) hypertension: Secondary | ICD-10-CM

## 2019-04-12 DIAGNOSIS — R1310 Dysphagia, unspecified: Secondary | ICD-10-CM

## 2019-04-12 DIAGNOSIS — R5381 Other malaise: Principal | ICD-10-CM

## 2019-04-12 DIAGNOSIS — E119 Type 2 diabetes mellitus without complications: Secondary | ICD-10-CM

## 2019-04-12 DIAGNOSIS — F05 Delirium due to known physiological condition: Secondary | ICD-10-CM

## 2019-04-12 LAB — GLUCOSE, CAPILLARY
Glucose-Capillary: 134 mg/dL — ABNORMAL HIGH (ref 70–99)
Glucose-Capillary: 184 mg/dL — ABNORMAL HIGH (ref 70–99)
Glucose-Capillary: 148 mg/dL — ABNORMAL HIGH (ref 70–99)
Glucose-Capillary: 170 mg/dL — ABNORMAL HIGH (ref 70–99)

## 2019-04-12 MED ORDER — QUETIAPINE FUMARATE 25 MG PO TABS
25.0000 mg | ORAL_TABLET | Freq: Two times a day (BID) | ORAL | Status: DC | PRN
  Administered 2019-04-12 – 2019-04-20 (×5): 25 mg via ORAL
  Filled 2019-04-12 (×5): qty 1

## 2019-04-12 NOTE — Evaluation (Signed)
Speech Language Pathology Assessment and Plan  Patient Details  Name: Benjamin Cohen MRN: [id number] Date of Birth: 07-27-1956  SLP Diagnosis: Cognitive Impairments;Dysphagia;Speech and Language deficits  Rehab Potential: Excellent ELOS: 16-20 days     Today's Date: 04/12/2019 SLP Individual Time: 1100-1140 SLP Individual Time Calculation (min): 40 min   Problem List:  Patient Active Problem List   Diagnosis Date Noted  . Diabetes mellitus type 2 in nonobese (HCC)   . Essential hypertension   . Metabolic encephalopathy   . 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 Debility 04/11/2019  . Staphylococcal pneumonia ([place])   . COVID-19   . Dysphagia   . Pressure injury of skin 03/23/2019  . Acute respiratory failure with hypoxia ([place]) 03/11/2019  . Noncompliance   . History of stroke   . Cerebral thrombosis with cerebral infarction 04/29/2018  . CVA (cerebral vascular accident) ([place]) 04/29/2018  . HLD (hyperlipidemia) 01/04/2015  . Diabetes ([place]) 11/14/2013  . Cerebral infarction ([place]) 11/12/2013  . Hypertensive urgency 11/12/2013   Past Medical History:  Past Medical History:  Diagnosis Date  . Diabetes mellitus without complication ([place])   . Hypertension   . Stroke [facility])    Past Surgical History:  Past Surgical History:  Procedure Laterality Date  . NO PAST SURGERIES      Assessment / Plan / Recommendation Clinical Impression Patient is a 63 year old male with history of CVA, T2DMand HTN who was admitted on 03/11/19 with fever, cough and SOB that developed after attending a wedding mid March with multiple family members who were diagnosed with COVID. CXR showed multifocal opacities and work up positive for COVID 19. He required intubation due to ARDSand was started on Plaquenil, toclizamab X 1,Ceftriaxone and Zithromax . He tolerated extubation by 4/12 but developed increase WOB despite HFNC and was reintubated on 4/17. He was started on  broad spectrum antibiotics for MRSAHCAP andmoved to [place]. He tolerated extubation by 4/20 and has completed course of diflucan as well as Vancomycin. Hospital course significant for delirium, few beats asymptomatic VT, dysphagia--has pulled out NGT multiple times. MBS done 4/29 and patient started on dysphagia 2, Nectar liquids. He did sustain a fall 4/29--no injuries reported. Therapy ongoing and patient noted to have deficits in processing, hypophonia with decrease in verbal output, balance deficits as well as dysphagia. CIR recommended due to encephalopathy with debility. Patient admitted 04/11/19.  Patient demonstrates moderate-severe cognitive impairments characterized by decreased sustained attention, functional problem solving, intellectual awareness and recall which impacts his safety with functional and familiar tasks. Patient's safety is also impacted by restlessness and impulsivity. Patient's overall auditory comprehension and verbal expression appeared [facility] for all tasks assessed. However, patient did demonstrate intermittent language of confusion. Patient was ~90-100% intelligible at the sentence level but demonstrates hypophonia due to prolonged intubation.  Patient demonstrated delayed oral transit with multiple swallows with both Dys. 1 and Dys. 2 textures and nectar-thick liquids with cues needed due to impulsivity resulting in one overt cough with pureed textures. Recommend patient continue current diet of Dys. 2 textures with nectar-thick liquids with full supervision to maximize safety.  Patient would benefit from skilled SLP intervention to maximize his cognitive and swallowing function prior to discharge.    Skilled Therapeutic Interventions          Administered a cognitive-linguistic evaluation and BSE, please see above for details.   SLP Assessment  Patient will need skilled Speech Lanaguage Pathology Services during CIR admission  Recommendations  SLP Diet Recommendations:  Dysphagia 2 (Fine chop);Nectar Liquid Administration via: Cup;No straw Medication Administration: Whole meds with puree(crush if large ) Supervision: Patient able to self feed;Full supervision/cueing for compensatory strategies Compensations: Minimize environmental distractions;Slow rate;Small sips/bites;Multiple dry swallows after each bite/sip Postural Changes and/or Swallow Maneuvers: Seated upright 90 degrees Oral Care Recommendations: Oral care BID Recommendations for Other Services: Neuropsych consult Patient destination: Home Follow up Recommendations: 24 hour supervision/assistance;Home Health SLP Equipment Recommended: To be determined    SLP Frequency 3 to 5 out of 7 days   SLP Duration  SLP Intensity  SLP Treatment/Interventions 16-20 days   Minumum of 1-2 x/day, 30 to 90 minutes  Cognitive remediation/compensation;Environmental controls;Internal/external aids;Speech/Language facilitation;Therapeutic Activities;Patient/family education;Functional tasks;Dysphagia/aspiration precaution training;Cueing hierarchy    Pain No/Denies Pain  Short Term Goals: Week 1: SLP Short Term Goal 1 (Week 1): Patient will demosntrate sustained attention to functional tasks for ~8 minutes with Mod A verbal cues for redirction.  SLP Short Term Goal 2 (Week 1): Patient will demonstrate functional problem solving for basic and familiar tasks with Mod A verbal cues.  SLP Short Term Goal 3 (Week 1): Patient will utilize external aids to maximize recall of functional information with Mod A multmodal cues.  SLP Short Term Goal 4 (Week 1): Patient will identify 2 physical and 2 cognitive deficits with Mod A multimodal cues.  SLP Short Term Goal 5 (Week 1): Patient will consume current diet with minimal overt s/s of aspiration with Min verbal cus for use of swallowing strategies.  SLP Short Term Goal 6 (Week 1): Patient will consume trials of ice chips/thin liquids over 3 sessions with minimal overt s/s  of aspiration and Min A verbal cues to assess readiness for repeat MBS.   Refer to Care Plan for Long Term Goals  Recommendations for other services: Neuropsych  Discharge Criteria: Patient will be discharged from SLP if patient refuses treatment 3 consecutive times without medical reason, if treatment goals not met, if there is a change in medical status, if patient makes no progress towards goals or if patient is discharged from hospital.  The above assessment, treatment plan, treatment alternatives and goals were discussed and mutually agreed upon: by patient  Zeya Balles 04/12/2019, 3:06 PM

## 2019-04-12 NOTE — Evaluation (Signed)
Physical Therapy Assessment and Plan  Patient Details  Name: PANAGIOTIS OELKERS MRN: [id number] Date of Birth: 1956/10/26  PT Diagnosis: Abnormality of gait, Cognitive deficits and Muscle weakness Rehab Potential: Good ELOS: 15-20 days   Today's Date: 04/12/2019 PT Individual Time: 1430-1530 PT Individual Time Calculation (min): 60 min    Problem List:  Patient Active Problem List   Diagnosis Date Noted  . Diabetes mellitus type 2 in nonobese (HCC)   . Essential hypertension   . Metabolic encephalopathy   . 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 Debility 04/11/2019  . Staphylococcal pneumonia ([place])   . COVID-19   . Dysphagia   . Pressure injury of skin 03/23/2019  . Acute respiratory failure with hypoxia ([place]) 03/11/2019  . Noncompliance   . History of stroke   . Cerebral thrombosis with cerebral infarction 04/29/2018  . CVA (cerebral vascular accident) ([place]) 04/29/2018  . HLD (hyperlipidemia) 01/04/2015  . Diabetes ([place]) 11/14/2013  . Cerebral infarction ([place]) 11/12/2013  . Hypertensive urgency 11/12/2013    Past Medical History:  Past Medical History:  Diagnosis Date  . Diabetes mellitus without complication ([place])   . Hypertension   . Stroke [facility])    Past Surgical History:  Past Surgical History:  Procedure Laterality Date  . NO PAST SURGERIES      Assessment & Plan Clinical Impression: STANISLAW ACTON is a 63 year old male with history of CVA, T2DMand HTN who was admitted on 03/11/19 with fever, cough and SOB that developed after attending a wedding mid March with multiple family members who were diagnosed with COVID. CXR showed multifocal opacities and work up positive for COVID 19. He required intubation due to ARDSand was started on Plaquenil, toclizamab X 1,Ceftriaxone and Zithromax . He tolerated extubation by 4/12 but developed increase WOB despite HFNC and was reintubated on 4/17. He was started on broad spectrum antibiotics for  MRSAHCAP andmoved to [place]. He tolerated extubation by 4/20 and has completed course of diflucan as well as Vancomycin. Hospital course significant for delirium, few beats asymptomatic VT, dysphagia--has pulled out NGT multiple times. MBS done 4/29 and patient started on dysphagia 2, Nectar liquids. He did sustain a fall 4/29--no injuries reported. Therapy ongoing and patient noted to have deficits in processing, hypophonia with decrease in verbal output, balance deficits as well as dysphagia. CIR recommended due to encephalopathy with debility.   Patient currently requires min with mobility secondary to muscle weakness and decreased attention, decreased awareness, decreased problem solving, decreased safety awareness and decreased memory.  Prior to hospitalization, patient was independent  with mobility and lived with Son in a House home.  Home access is 5Stairs to enter.  Patient will benefit from skilled PT intervention to maximize safe functional mobility, minimize fall risk and decrease caregiver burden for planned discharge home with 24 hour supervision.  Anticipate patient will benefit from follow up [place] at discharge.  PT - End of Session Activity Tolerance: Decreased this session;Tolerates 30+ min activity with multiple rests Endurance Deficit: Yes Endurance Deficit Description: could not complete task of changing pants without resting PT Assessment Rehab Potential (ACUTE/IP ONLY): Good PT Barriers to Discharge: Home environment access/layout PT Barriers to Discharge Comments: two level home here, plans for eventual d/c to [facility] PT Patient demonstrates impairments in the following area(s): Balance;Endurance;Safety;Motor PT Transfers Functional Problem(s): Bed Mobility;Bed to Chair;Car;Furniture;Floor PT Locomotion Functional Problem(s): Ambulation;Stairs PT Plan PT Intensity: Minimum of 1-2 x/day ,45 to 90 minutes PT Frequency:  5 out of 7 days PT Duration Estimated Length of Stay: 15-20  days PT Treatment/Interventions: Training and development officer;Ambulation/gait training;Cognitive remediation/compensation;DME/adaptive equipment instruction;Functional mobility training;Therapeutic Activities;UE/LE Strength taining/ROM;Therapeutic Exercise;Stair training;Patient/family education;Neuromuscular re-education PT Transfers Anticipated Outcome(s): S PT Locomotion Anticipated Outcome(s): S PT Recommendation Follow Up Recommendations: Home health PT;24 hour supervision/assistance Patient destination: Home Equipment Recommended: Rolling walker with 5" wheels  Skilled Therapeutic Intervention Patient in supine and asleep.  Aroused slowly and utilized face cloth for pt to attempt to arouse easier.  Reported still sleepy, but able to come up to sit with S on EOB.  Patient requesting to brush teeth.  Vitals sitting EOB 146/90, HR 89, SpO2 98%.  Patient transfer to w/c min a.  Sitting at sink to brush teeth with set up and cues for safety to avoid swallowing water due to diet D2 nectar liquids.  Patient in w/c propelled about 65' c/o L shoulder pain throughout so PT assisted to push rest of distance to therapy gym.  Patient sit to stand min A and ambulated about 15' to stairs, negotiated 8 (3") steps with min a increased time and pt slow to descend reports due to fatigue.  HR & SpO2 checked 92 bpm & 98% respectively.  Patient assisted to ortho gym.  Performed car transfer to sedan height as pt relates his car make/model.  Patient with small incontinent void of urine in scrubs so returned to room in w/c.  Seated at sink, pt min A to stand to lower pants and washed at sink with set up in sitting min A in standing.  Patient given new pants which he threw to his feet so assisted to thread his feet in pants in sitting, then he stood at sink to don with min a finishing cleaning and drying perineal area first.  Patient amb 3' to bed no device min A.  Sit to supine S and left with bed alarm on and checked with  RN as pt requesting apple sauce and she reports he can eat with intermittent S (despite note for full S for meals in room).  Patient sitting EOB upon re-entry to deliver apple sauce.  Patient eating apple sauce seated EOB with alarm on and call bell in reach.    PT Evaluation Precautions/Restrictions Precautions Precautions: Fall Precaution Comments: High risk for falls/telesitter Pain Pain Assessment Pain Scale: 0-10 Pain Score: 7  Faces Pain Scale: Hurts little more Pain Type: Chronic pain Pain Location: Shoulder Pain Orientation: Left Pain Descriptors / Indicators: Aching Pain Frequency: Intermittent Pain Onset: With Activity Patients Stated Pain Goal: 0 Pain Intervention(s): Rest Home Living/Prior Functioning Home Living Available Help at Discharge: Available 24 hours/day;Family Type of Home: House Home Access: Stairs to enter [employer] of Steps: 5 Entrance Stairs-Rails: Right;Left Home Layout: Two level;1/2 bath on main level Alternate Level Stairs-Number of Steps: flight Alternate Level Stairs-Rails: Right Bathroom Shower/Tub: Chiropodist: Standard Additional Comments: Plan to go to [facility] to stay with Jacqulynn Cadet, x-wife  Lives With: Son Prior Function Level of Independence: Independent with homemaking with ambulation;Independent with basic ADLs;Independent with gait;Independent with transfers  Able to Take Stairs?: Yes Driving: Yes Vocation: Other (Comment) Vocation Requirements: 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 Praxis Praxis: Intact  Cognition Overall Cognitive Status: Impaired/Different from baseline Arousal/Alertness: Lethargic Orientation Level: Oriented to person;Oriented to place;Oriented to situation;Disoriented to time Attention: Sustained Sustained Attention: Impaired Sustained Attention Impairment: Verbal basic;Functional basic Memory: Impaired Memory  Impairment: Storage deficit;Decreased recall of  new information;Decreased short term memory Awareness: Impaired Awareness Impairment: Intellectual impairment Problem Solving: Impaired Problem Solving Impairment: Verbal basic;Functional basic Behaviors: Restless;Impulsive Safety/Judgment: Impaired Comments: language of confusion  Sensation Sensation Light Touch: Impaired by gross assessment Additional Comments: reports tingling and numbness R foot only Coordination Gross Motor Movements are Fluid and Coordinated: No Fine Motor Movements are Fluid and Coordinated: No Motor  Motor Motor: Abnormal postural alignment and control Motor - Skilled Clinical Observations: generalized weakness; difficulty maintaining upright posture for long  Mobility Bed Mobility Bed Mobility: Supine to Sit;Sit to Supine;Rolling Right Rolling Right: Supervision/verbal cueing Supine to Sit: Supervision/Verbal cueing Sit to Supine: Supervision/Verbal cueing Transfers Transfers: Sit to Stand;Stand to Sit;Stand Pivot Transfers Sit to Stand: Minimal Assistance - Patient > 75% Stand to Sit: Minimal Assistance - Patient > 75% Stand Pivot Transfers: Minimal Assistance - Patient > 75% Stand Pivot Transfer Details: Verbal cues for precautions/safety;Verbal cues for safe use of DME/AE Transfer (Assistive device): Rolling walker Locomotion  Gait Ambulation: Yes Gait Assistance: Minimal Assistance - Patient > 75% Gait Distance (Feet): 15 Feet Assistive device: Rolling walker Gait Assistance Details: Verbal cues for safe use of DME/AE;Verbal cues for technique Gait Assistance Details: ambulated between wc and activities for initial assessment only due to fatigue Stairs / Additional Locomotion Stairs: Yes Stairs Assistance: Minimal Assistance - Patient > 75% Stair Management Technique: Two rails;Alternating pattern;Step to pattern;Forwards Number of Stairs: 8 Height of Stairs: 3 Wheelchair Mobility Wheelchair  Mobility: Yes Wheelchair Assistance: Minimal assistance - Patient >75% Wheelchair Propulsion: Both upper extremities Distance: 40'  Trunk/Postural Assessment  Cervical Assessment Cervical Assessment: Exceptions to WFL(forward head) Thoracic Assessment Thoracic Assessment: Exceptions to WFL(rounded shoulders) Lumbar Assessment Lumbar Assessment: Exceptions to WFL(posterior pelvic tilt) Postural Control Postural Control: Deficits on evaluation(delayed)  Balance Balance Balance Assessed: Yes Dynamic Sitting Balance Dynamic Sitting - Balance Support: Feet supported Dynamic Sitting - Level of Assistance: 5: Stand by assistance Sitting balance - Comments: brushing teeth/washing face at sink Static Standing Balance Static Standing - Balance Support: No upper extremity supported Static Standing - Level of Assistance: 4: Min assist Dynamic Standing Balance Dynamic Standing - Balance Support: During functional activity;Right upper extremity supported Dynamic Standing - Level of Assistance: 4: Min assist Dynamic Standing - Balance Activities: Other (comment) Dynamic Standing - Comments: pulling up pants Extremity Assessment      RLE Assessment RLE Assessment: Exceptions to [facility] Active Range of Motion (AROM) Comments: WFL General Strength Comments: hip flexion 3+/5, knee ext 4-/5, ankle DF 4-/5 LLE Assessment LLE Assessment: Exceptions to [facility] Active Range of Motion (AROM) Comments: WFL General Strength Comments: hip flexion 3+/5, knee ext 4-/5, ankle DF 4-/5    Refer to Care Plan for Long Term Goals  Recommendations for other services: None   Discharge Criteria: Patient will be discharged from PT if patient refuses treatment 3 consecutive times without medical reason, if treatment goals not met, if there is a change in medical status, if patient makes no progress towards goals or if patient is discharged from hospital.  The above assessment, treatment plan, treatment alternatives  and goals were discussed and mutually agreed upon: by patient  Jamison Oka, PT 04/12/2019, 5:43 PM

## 2019-04-12 NOTE — Evaluation (Signed)
Occupational Therapy Assessment and Plan  Patient Details  Name: Benjamin Cohen MRN: [id number] Date of Birth: 1956-06-22  OT Diagnosis: abnormal posture, cognitive deficits, muscle weakness (generalized) and pain in joint Rehab Potential: Rehab Potential (ACUTE ONLY): Good ELOS: 16-20   Today's Date: 04/12/2019 OT Individual Time: 0277-4128 OT Individual Time Calculation (min): 43 min  and Today's Date: 04/12/2019 OT Missed Time: 17 Minutes Missed Time Reason: Patient fatigue    Problem List:  Patient Active Problem List   Diagnosis Date Noted  . Debility 04/11/2019  . Staphylococcal pneumonia ([place])   . COVID-19   . Dysphagia   . Pressure injury of skin 03/23/2019  . Acute respiratory failure with hypoxia ([place]) 03/11/2019  . Noncompliance   . History of stroke   . Cerebral thrombosis with cerebral infarction 04/29/2018  . CVA (cerebral vascular accident) ([place]) 04/29/2018  . HLD (hyperlipidemia) 01/04/2015  . Diabetes ([place]) 11/14/2013  . Cerebral infarction ([place]) 11/12/2013  . Hypertensive urgency 11/12/2013    Past Medical History:  Past Medical History:  Diagnosis Date  . Diabetes mellitus without complication ([place])   . Hypertension   . Stroke [facility])    Past Surgical History:  Past Surgical History:  Procedure Laterality Date  . NO PAST SURGERIES      Assessment & Plan Clinical Impression:   Benjamin Cohen is a 63 year old male with history of CVA, T2DMand HTN who was admitted on 03/11/19 with fever, cough and SOB that developed after attending a wedding mid March with multiple family members who were diagnosed with COVID. CXR showed multifocal opacities and work up positive for COVID 19. He required intubation due to ARDSand was started on Plaquenil, toclizamab X 1,Ceftriaxone and Zithromax . He tolerated extubation by 4/12 but developed increase WOB despite HFNC and was reintubated on 4/17. He was started on broad spectrum antibiotics for MRSAHCAP andmoved to  [place]. He tolerated extubation by 4/20 and has completed course of diflucan as well as Vancomycin. Hospital course significant for delirium, few beats asymptomatic VT, dysphagia--has pulled out NGT multiple times. MBS done 4/29 and patient started on dysphagia 2, Nectar liquids. He did sustain a fall 4/29--no injuries reported. Therapy ongoing and patient noted to have deficits in processing, hypophonia with decrease in verbal output, balance deficits as well as dysphagia. CIR recommended due to encephalopathy with debility.    Patient currently requires min- mod with basic self-care skills secondary to muscle weakness, decreased cardiorespiratoy endurance, decreased attention, decreased awareness, decreased problem solving, decreased safety awareness, decreased memory and delayed processing and decreased sitting balance, decreased standing balance, decreased postural control and decreased balance strategies.  Prior to hospitalization, patient could complete BADL with independent .  Patient will benefit from skilled intervention to decrease level of assist with basic self-care skills and increase independence with basic self-care skills prior to discharge home with care partner.  Anticipate patient will require 24 hour supervision and follow up home health.  OT - End of Session Activity Tolerance: Tolerates 10 - 20 min activity with multiple rests Endurance Deficit: Yes Endurance Deficit Description: Pt able to complete ~5 min activity prior to needing supine/supported seat rest OT Assessment Rehab Potential (ACUTE ONLY): Good OT Barriers to Discharge: Decreased caregiver support;Home environment access/layout OT Barriers to Discharge Comments: flight of stairs OT Patient demonstrates impairments in the following area(s): Balance;Behavior;Cognition;Edema;Endurance;Pain;Safety;Skin Integrity OT Basic ADL's Functional Problem(s): Grooming;Bathing;Dressing;Toileting OT Transfers Functional  Problem(s): Toilet;Tub/Shower OT Additional Impairment(s): None OT Plan OT Intensity: Minimum of 1-2  x/day, 45 to 90 minutes OT Frequency: 5 out of 7 days OT Duration/Estimated Length of Stay: 16-20 OT Treatment/Interventions: Balance/vestibular training;Community reintegration;Disease mangement/prevention;Neuromuscular re-education;Patient/family education;Self Care/advanced ADL retraining;Splinting/orthotics;Therapeutic Exercise;UE/LE Coordination activities;Wheelchair propulsion/positioning;Cognitive remediation/compensation;Discharge planning;DME/adaptive equipment instruction;Functional mobility training;Pain management;Psychosocial support;Therapeutic Activities;Skin care/wound managment;UE/LE Strength taining/ROM;Visual/perceptual remediation/compensation OT Basic Self-Care Anticipated Outcome(s): S OT Toileting Anticipated Outcome(s): S OT Bathroom Transfers Anticipated Outcome(s): S OT Recommendation Patient destination: Home Follow Up Recommendations: Home health OT Equipment Recommended: 3 in 1 bedside comode;Tub/shower bench;To be determined   Skilled Therapeutic Intervention 1;1. Pt received in recliner with no c/o pain, however later pt reporting pain in shoulder declining intervention from RN. Vitals 140/90 HR 101 O2 100%. Pt completes stand pivot transfer back to bed with MIN A. Pt requires VC for lying supine for rest break towards [facility] as pt attempting to lie towards foot. Pt unaware. After rest break pt dons shirt with A to thread RUE and head. Pt demo decreased attention often attempting ot move onto another step prior to completing the current step. Pt threads BLE and sit to stand with MIN A to advance pants past hips. Pt completes 5 min diaphragmatic breathing in supine with notebook placed on belly. Pt cues to breath in throuhg nose and out through mouth while watching book rise/fall. Pt unabe to attend. [name] music is pts favorite and began playing for improved arousal and  attention with minor improvement. Pt able to sit EOB with CGA while donning B socks. Terminated session early d/t pt fatigue. Exited session with pt supine in bed, exit alarm on and call light in reach  OT Evaluation Precautions/Restrictions  Precautions Precautions: Fall Precaution Comments: High risk for falls.has a Air cabin crew Restrictions Weight Bearing Restrictions: No General Chart Reviewed: Yes Family/Caregiver Present: No Vital Signs Therapy Vitals Temp: 98.1 F (36.7 C) Temp Source: Oral Pulse Rate: 89 BP: (!) 159/98 Oxygen Therapy SpO2: 100 % Pain Pain Assessment Pain Score: 0-No pain Home Living/Prior Functioning Home Living Family/patient expects to be discharged to:: Private residence Available Help at Discharge: Available 24 hours/day, Family Type of Home: House Home Access: Stairs to enter Technical brewer of Steps: 5 Entrance Stairs-Rails: Right, Left Home Layout: Two level, 1/2 bath on main level Alternate Level Stairs-Number of Steps: flight Alternate Level Stairs-Rails: Right Bathroom Shower/Tub: Chiropodist: Standard Bathroom Accessibility: Yes Additional Comments: Plan to go to [facility] to stay with Jacqulynn Cadet, x-wife Prior Function Comments: Drives; 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); likes to fish; enjoys playing golf ADL   Vision Baseline Vision/History: No visual deficits Additional Comments: Will further assess, poor attention/arousal impacting assessment Perception  Perception: Within Functional Limits Praxis Praxis: Intact Cognition Overall Cognitive Status: Impaired/Different from baseline Arousal/Alertness: Awake/alert Orientation Level: Person;Place;Situation Year: 2020 Month: June Day of Week: Incorrect Memory: Impaired Immediate Memory Recall: Sock;Blue;Bed Memory Recall: Blue Memory Recall Blue: Without Cue Attention: Sustained Sustained Attention: Impaired Sustained  Attention Impairment: Verbal basic Awareness: Impaired Awareness Impairment: Intellectual impairment Behaviors: Restless;Impulsive Safety/Judgment: Impaired Sensation Sensation Light Touch: Appears Intact Coordination Gross Motor Movements are Fluid and Coordinated: No Fine Motor Movements are Fluid and Coordinated: No Finger Nose Finger Test: WFL RUE; L shoudler pain Motor  Motor Motor: Abnormal postural alignment and control Motor - Discharge Observations: generalized weakness Mobility  Transfers Sit to Stand: Minimal Assistance - Patient > 75% Stand to Sit: Minimal Assistance - Patient > 75%  Trunk/Postural Assessment  Cervical Assessment Cervical Assessment: Exceptions to WFL(head forward) Thoracic Assessment Thoracic Assessment:  Exceptions to WFL(rounded shoudlers) Lumbar Assessment Lumbar Assessment: Exceptions to WFL(posterior pelvic tilt) Postural Control Postural Control: Deficits on evaluation(delayed)  Balance Balance Balance Assessed: Yes Dynamic Sitting Balance Dynamic Sitting - Balance Support: Feet supported Dynamic Sitting - Level of Assistance: 4: Min assist Sitting balance - Comments: donning clothing  Static Standing Balance Static Standing - Balance Support: No upper extremity supported Static Standing - Level of Assistance: 4: Min assist Extremity/Trunk Assessment RUE Assessment RUE Assessment: Exceptions to [facility] General Strength Comments: generalized weakness LUE Assessment LUE Assessment: Exceptions to [facility] General Strength Comments: generalized wekaness, pt guarding L shoulder reporting pain in shoulder PTA     Refer to Care Plan for Long Term Goals  Recommendations for other services: Therapeutic Recreation  Pet therapy and Stress management   Discharge Criteria: Patient will be discharged from OT if patient refuses treatment 3 consecutive times without medical reason, if treatment goals not met, if there is a change in medical status, if  patient makes no progress towards goals or if patient is discharged from hospital.  The above assessment, treatment plan, treatment alternatives and goals were discussed and mutually agreed upon: by patient  Tonny Branch 04/12/2019, 8:55 AM

## 2019-04-12 NOTE — Progress Notes (Signed)
[place] PHYSICAL MEDICINE & REHABILITATION PROGRESS NOTE  Subjective/Complaints: Patient seen sitting up in bed this morning.  He appears to be trying to get out of bed.  Encourage patient to stay in bed.  He indicates he slept well overnight.  He does not vocalize.  Discussed with nursing-behavioral issues overnight.  ROS: Denies CP, shortness of breath, nausea, vomiting, diarrhea.  Objective: Vital Signs: Blood pressure (!) 159/98, pulse 89, temperature 98.1 F (36.7 C), temperature source Oral, resp. rate 18, height 5\' 7"  (1.702 m), weight 81 kg, SpO2 100 %. No results found. No results for input(s): WBC, HGB, HCT, PLT in the last 72 hours. Recent Labs    04/10/19 0532 04/11/19 0425  NA 140 139  K 3.5 3.6  CL 108 107  CO2 25 23  GLUCOSE 140* 157*  BUN 7* 10  CREATININE 0.85 0.78  CALCIUM 8.7* 8.2*    Physical Exam: BP (!) 159/98   Pulse 89   Temp 98.1 F (36.7 C) (Oral)   Resp 18   Ht 5\' 7"  (1.702 m)   Wt 81 kg   SpO2 100%   BMI 27.97 kg/m  Constitutional: No distress . Vital signs reviewed. HENT: Normocephalic.  Atraumatic. Eyes: EOMI. No discharge. Cardiovascular: No JVD. Respiratory: Normal effort. GI: Non-distended. Musc: No edema or tenderness in extremities. Neurological: He isalert. Nonverbal this a.m. Motor: Grossly 4+/5 throughout  Psychiatric: He has a normal mood and affect.  Assessment/Plan: 1. Functional deficits secondary to debility which require 3+ hours per day of interdisciplinary therapy in a comprehensive inpatient rehab setting.  Physiatrist is providing close team supervision and 24 hour management of active medical problems listed below.  Physiatrist and rehab team continue to assess barriers to discharge/monitor patient progress toward functional and medical goals  Care Tool:  Bathing  Bathing activity did not occur: Refused           Bathing assist Assist Level: Moderate Assistance - Patient 50 - 74%     Upper Body  Dressing/Undressing Upper body dressing   What is the patient wearing?: Pull over shirt    Upper body assist Assist Level: Moderate Assistance - Patient 50 - 74%    Lower Body Dressing/Undressing Lower body dressing      What is the patient wearing?: Pants     Lower body assist Assist for lower body dressing: Minimal Assistance - Patient > 75%     Toileting Toileting    Toileting assist Assist for toileting: Moderate Assistance - Patient 50 - 74%     Transfers Chair/bed transfer  Transfers assist     Chair/bed transfer assist level: Minimal Assistance - Patient > 75%     Locomotion Ambulation   Ambulation assist              Walk 10 feet activity   Assist           Walk 50 feet activity   Assist           Walk 150 feet activity   Assist           Walk 10 feet on uneven surface  activity   Assist           Wheelchair     Assist               Wheelchair 50 feet with 2 turns activity    Assist            Wheelchair 150 feet activity  Assist            Medical Problem List and Plan: 1.Functional deficits and impaired mobilitysecondary to debility/encephalopathy after COVID 19 infection and subsequent respiratory failure/ARDS  Begin CIR 2. Antithrombotics: -DVT/anticoagulation:Pharmaceutical:Lovenox -antiplatelet therapy: Plavix 3. Pain Management:tylenol prn 4. Mood:LCSW to follow for evaluation when appropriate. -antipsychotic agents: Continue seroquel at bedtime.  DC'd Haldol and added Seroquelprn 5. Neuropsych: This patientis notcapable of making decisions on hisown behalf.  Continue tele-sitter for safety 6. Skin/Wound Care:routine pressure relief measures 7. Fluids/Electrolytes/Nutrition:Monitor I/O.   BMP ordered for Monday 8. Metabolic encephalopathy/Delirium:  See #4 -sleep-wake chart 9. ZOX:[id number]:Monitor BP bid --continue  metoprolol for BP/HR control.  Monitor with increased mobility 10. H/o CVA: OnPlavix. 11. T2DM:Hgb A1c- 12.4 --has history of non-compliance per records.  Monitor with increased mobility 12. Dysphagia:On dysphagia 2, nectar liquids. Added full supervision for safety with intake.  LOS: 1 days A FACE TO FACE EVALUATION WAS PERFORMED  Ankit Karis Jubanil Patel 04/12/2019, 11:33 AM

## 2019-04-12 NOTE — Progress Notes (Signed)
PICC line removed per order. PICC line intact. Vaseline gauze and gauze dressing applied and pressure held. Patient and RN aware he is on bedrest until 1055. RN and patient made aware to leave dressing on and dry for 24 hours. Patient currently confused, RN to keep an eye on dressing.

## 2019-04-13 DIAGNOSIS — R0989 Other specified symptoms and signs involving the circulatory and respiratory systems: Secondary | ICD-10-CM

## 2019-04-13 DIAGNOSIS — R7309 Other abnormal glucose: Secondary | ICD-10-CM

## 2019-04-13 LAB — GLUCOSE, CAPILLARY
Glucose-Capillary: 143 mg/dL — ABNORMAL HIGH (ref 70–99)
Glucose-Capillary: 205 mg/dL — ABNORMAL HIGH (ref 70–99)
Glucose-Capillary: 154 mg/dL — ABNORMAL HIGH (ref 70–99)
Glucose-Capillary: 126 mg/dL — ABNORMAL HIGH (ref 70–99)

## 2019-04-13 NOTE — Progress Notes (Signed)
[place] PHYSICAL MEDICINE & REHABILITATION PROGRESS NOTE  Subjective/Complaints: Patient seen laying in bed this morning.  Indicates he slept well overnight.  He indicates he had a good first day of therapies yesterday.  ROS: Denies CP, shortness of breath, nausea, vomiting, diarrhea.  Objective: Vital Signs: Blood pressure 120/75, pulse 96, temperature 98.6 F (37 C), temperature source Oral, resp. rate 16, height 5\' 7"  (1.702 m), weight 81 kg, SpO2 98 %. No results found. No results for input(s): WBC, HGB, HCT, PLT in the last 72 hours. Recent Labs    04/11/19 0425  NA 139  K 3.6  CL 107  CO2 23  GLUCOSE 157*  BUN 10  CREATININE 0.78  CALCIUM 8.2*    Physical Exam: BP 120/75 (BP Location: Right Arm)   Pulse 96   Temp 98.6 F (37 C) (Oral)   Resp 16   Ht 5\' 7"  (1.702 m)   Wt 81 kg   SpO2 98%   BMI 27.97 kg/m  Constitutional: No distress . Vital signs reviewed. HENT: Normocephalic.  Atraumatic. Eyes: EOMI.  No discharge. Cardiovascular: No JVD. Respiratory: Normal effort. GI: Non-distended. Musc: No edema or tenderness in extremities. Neurological: Alert and oriented x2. Motor: Grossly 4+/5 throughout, unchanged Psychiatric: He has a normal mood and affect.  Assessment/Plan: 1. Functional deficits secondary to debility which require 3+ hours per day of interdisciplinary therapy in a comprehensive inpatient rehab setting.  Physiatrist is providing close team supervision and 24 hour management of active medical problems listed below.  Physiatrist and rehab team continue to assess barriers to discharge/monitor patient progress toward functional and medical goals  Care Tool:  Bathing  Bathing activity did not occur: Refused Body parts bathed by patient: Face, Front perineal area, Buttocks, Right upper leg, Left upper leg         Bathing assist Assist Level: Moderate Assistance - Patient 50 - 74%     Upper Body Dressing/Undressing Upper body  dressing   What is the patient wearing?: Pull over shirt    Upper body assist Assist Level: Moderate Assistance - Patient 50 - 74%    Lower Body Dressing/Undressing Lower body dressing      What is the patient wearing?: Pants     Lower body assist Assist for lower body dressing: Minimal Assistance - Patient > 75%     Toileting Toileting    Toileting assist Assist for toileting: Moderate Assistance - Patient 50 - 74%     Transfers Chair/bed transfer  Transfers assist     Chair/bed transfer assist level: Supervision/Verbal cueing     Locomotion Ambulation   Ambulation assist      Assist level: Minimal Assistance - Patient > 75% Assistive device: Walker-rolling Max distance: 15'   Walk 10 feet activity   Assist     Assist level: Minimal Assistance - Patient > 75% Assistive device: Walker-rolling   Walk 50 feet activity   Assist Walk 50 feet with 2 turns activity did not occur: Safety/medical concerns         Walk 150 feet activity   Assist Walk 150 feet activity did not occur: Safety/medical concerns         Walk 10 feet on uneven surface  activity   Assist Walk 10 feet on uneven surfaces activity did not occur: Safety/medical concerns         Wheelchair     Assist Will patient use wheelchair at discharge?: No Type of Wheelchair: Manual    Wheelchair assist  level: Minimal Assistance - Patient > 75% Max wheelchair distance: 12'    Wheelchair 50 feet with 2 turns activity    Assist            Wheelchair 150 feet activity     Assist Wheelchair 150 feet activity did not occur: Safety/medical concerns          Medical Problem List and Plan: 1.Functional deficits and impaired mobilitysecondary to debility/encephalopathy after COVID 19 infection and subsequent respiratory failure/ARDS  Continue CIR 2. Antithrombotics: -DVT/anticoagulation:Pharmaceutical:Lovenox -antiplatelet therapy:  Plavix 3. Pain Management:tylenol prn 4. Mood:LCSW to follow for evaluation when appropriate. -antipsychotic agents: Continue seroquel at bedtime.  DC'd Haldol and added Seroquelprn 5. Neuropsych: This patientis notcapable of making decisions on hisown behalf.  Continue tele-sitter for safety 6. Skin/Wound Care:routine pressure relief measures 7. Fluids/Electrolytes/Nutrition:Monitor I/O.   BMP ordered for Monday 8. Metabolic encephalopathy/Delirium:  See #4 -sleep-wake chart 9. VOZ:DGUYQIH BP bid --continue metoprolol for BP/HR control.  Labile on 5/3, monitor for trend  Monitor with increased mobility 10. H/o CVA: OnPlavix. 11. T2DM:Hgb A1c- 12.4 --has history of non-compliance per records.  Labile on 5/3, monitor for trend  Monitor with increased mobility 12. Dysphagia:On dysphagia 2, nectar liquids. Added full supervision for safety with intake.  LOS: 2 days A FACE TO FACE EVALUATION WAS PERFORMED  Samanda Buske Karis Juba 04/13/2019, 11:38 AM

## 2019-04-14 ENCOUNTER — Inpatient Hospital Stay (HOSPITAL_COMMUNITY): Payer: 59

## 2019-04-14 ENCOUNTER — Inpatient Hospital Stay (HOSPITAL_COMMUNITY): Payer: 59 | Admitting: Physical Therapy

## 2019-04-14 ENCOUNTER — Inpatient Hospital Stay (HOSPITAL_COMMUNITY): Payer: 59 | Admitting: Occupational Therapy

## 2019-04-14 DIAGNOSIS — M19012 Primary osteoarthritis, left shoulder: Secondary | ICD-10-CM

## 2019-04-14 LAB — CBC WITH DIFFERENTIAL/PLATELET
Abs Immature Granulocytes: 0.01 10*3/uL (ref 0.00–0.07)
Basophils Absolute: 0 10*3/uL (ref 0.0–0.1)
Basophils Relative: 1 %
Eosinophils Absolute: 0.1 10*3/uL (ref 0.0–0.5)
Eosinophils Relative: 2 %
HCT: 34.1 % — ABNORMAL LOW (ref 39.0–52.0)
Hemoglobin: 10.9 g/dL — ABNORMAL LOW (ref 13.0–17.0)
Immature Granulocytes: 0 %
Lymphocytes Relative: 54 %
Lymphs Abs: 2.1 10*3/uL (ref 0.7–4.0)
MCH: 26.8 pg (ref 26.0–34.0)
MCHC: 32 g/dL (ref 30.0–36.0)
MCV: 83.8 fL (ref 80.0–100.0)
Monocytes Absolute: 0.3 10*3/uL (ref 0.1–1.0)
Monocytes Relative: 8 %
Neutro Abs: 1.4 10*3/uL — ABNORMAL LOW (ref 1.7–7.7)
Neutrophils Relative %: 35 %
Platelets: 458 10*3/uL — ABNORMAL HIGH (ref 150–400)
RBC: 4.07 MIL/uL — ABNORMAL LOW (ref 4.22–5.81)
RDW: 13.4 % (ref 11.5–15.5)
WBC: 4 10*3/uL (ref 4.0–10.5)
nRBC: 0 % (ref 0.0–0.2)

## 2019-04-14 LAB — GLUCOSE, CAPILLARY
Glucose-Capillary: 156 mg/dL — ABNORMAL HIGH (ref 70–99)
Glucose-Capillary: 166 mg/dL — ABNORMAL HIGH (ref 70–99)
Glucose-Capillary: 172 mg/dL — ABNORMAL HIGH (ref 70–99)
Glucose-Capillary: 124 mg/dL — ABNORMAL HIGH (ref 70–99)

## 2019-04-14 LAB — COMPREHENSIVE METABOLIC PANEL
ALT: 30 U/L (ref 0–44)
AST: 26 U/L (ref 15–41)
Albumin: 3.2 g/dL — ABNORMAL LOW (ref 3.5–5.0)
Alkaline Phosphatase: 80 U/L (ref 38–126)
Anion gap: 10 (ref 5–15)
BUN: 9 mg/dL (ref 8–23)
CO2: 26 mmol/L (ref 22–32)
Calcium: 9 mg/dL (ref 8.9–10.3)
Chloride: 104 mmol/L (ref 98–111)
Creatinine, Ser: 0.88 mg/dL (ref 0.61–1.24)
GFR calc Af Amer: >60 mL/min (ref >60)
GFR calc non Af Amer: >60 mL/min (ref >60)
Glucose, Bld: 178 mg/dL — ABNORMAL HIGH (ref 70–99)
Potassium: 3.6 mmol/L (ref 3.5–5.1)
Sodium: 140 mmol/L (ref 135–145)
Total Bilirubin: 0.6 mg/dL (ref 0.3–1.2)
Total Protein: 6.8 g/dL (ref 6.5–8.1)

## 2019-04-14 MED ORDER — DICLOFENAC SODIUM 1 % TD GEL
2.0000 g | Freq: Three times a day (TID) | TRANSDERMAL | Status: DC
  Administered 2019-04-14 – 2019-04-22 (×10): 2 g via TOPICAL
  Filled 2019-04-14: qty 100

## 2019-04-14 MED ORDER — POTASSIUM CHLORIDE 20 MEQ PO PACK
20.0000 meq | PACK | Freq: Every day | ORAL | Status: DC
  Administered 2019-04-15 – 2019-04-26 (×12): 20 meq via ORAL
  Filled 2019-04-14 (×12): qty 1

## 2019-04-14 MED ORDER — GLIMEPIRIDE 2 MG PO TABS
1.0000 mg | ORAL_TABLET | Freq: Every day | ORAL | Status: DC
  Administered 2019-04-14 – 2019-04-22 (×9): 1 mg via ORAL
  Filled 2019-04-14 (×9): qty 1

## 2019-04-14 NOTE — Progress Notes (Signed)
Occupational Therapy Session Note  Patient Details  Name: Benjamin Cohen MRN: [id number] Date of Birth: 1955-12-15  Today's Date: 04/14/2019 OT Individual Time: 1345-1430 OT Individual Time Calculation (min): 45 min    Short Term Goals: Week 1:  OT Short Term Goal 1 (Week 1): Pt will participate in functional task for 7 min prior to needing rest break to improve endurance OT Short Term Goal 2 (Week 1): Pt will sit to stand wiht CGA OT Short Term Goal 3 (Week 1): Pt will reach back prior to sitting to demo improved safety awareness wiht no VC OT Short Term Goal 4 (Week 1): Pt will stand for 1 grooming items for improved endurance OT Short Term Goal 5 (Week 1): Pt will don shirt wiht min A and min VC for compensatory strategies PRN  Skilled Therapeutic Interventions/Progress Updates:    Pt seen for OT ADL bathing/dressing session. Pt in supine upon arrival, agreeable to tx session and denying pain. He was agreeable to bathing at shower level this session. He gathered needed clothing items from duffel bag with min cuing to ensure he gathered all needed items.  He ambulated throughout session with min HHA. Several instances of posterior LOB during sit>stand and pt with little awareness to LOB episodes.  He bathed seated on tub transfer bench,standing with use of grab bars to complete pericare/buttock hygiene with steadying assist. Pt becoming emotional while in shower, tearful about how far he has come. Emotional support and empathetic listening provided.  He ambulated out of bathroom and dressed seated in w/c with steadying assist when standing to pull up pants. Pt did not recall incontinent episode from PT session when therapist educated regarding reason for using incontinence brief.  Pt desiring to stay up in w/c at end of session. Left seated with all needs in reach and chair belt alarm on.   Therapy Documentation Precautions:  Precautions Precautions: Fall Precaution Comments: High risk  for falls/telesitter Restrictions Weight Bearing Restrictions: No Pain: Pain Assessment Pain Scale: 0-10 Pain Score: 0-No pain   Therapy/Group: Individual Therapy  Amr Sturtevant L 04/14/2019, 7:15 AM

## 2019-04-14 NOTE — Progress Notes (Signed)
Speech Language Pathology Daily Session Note  Patient Details  Name: Benjamin Cohen MRN: [id number] Date of Birth: 08/08/1956  Today's Date: 04/14/2019 SLP Individual Time: 1115-1155 SLP Individual Time Calculation (min): 40 min  Short Term Goals: Week 1: SLP Short Term Goal 1 (Week 1): Patient will demosntrate sustained attention to functional tasks for ~8 minutes with Mod A verbal cues for redirction.  SLP Short Term Goal 2 (Week 1): Patient will demonstrate functional problem solving for basic and familiar tasks with Mod A verbal cues.  SLP Short Term Goal 3 (Week 1): Patient will utilize external aids to maximize recall of functional information with Mod A multmodal cues.  SLP Short Term Goal 4 (Week 1): Patient will identify 2 physical and 2 cognitive deficits with Mod A multimodal cues.  SLP Short Term Goal 5 (Week 1): Patient will consume current diet with minimal overt s/s of aspiration with Min verbal cus for use of swallowing strategies.  SLP Short Term Goal 6 (Week 1): Patient will consume trials of ice chips/thin liquids over 3 sessions with minimal overt s/s of aspiration and Min A verbal cues to assess readiness for repeat MBS.   Skilled Therapeutic Interventions:Skilled ST services focused on swallow and cognitive skills. Pt required total A recalling swallow precautions, SLP provided instruction. SLP facilitated PO consumption of dys 2 and NTL via cup lunch tray, pt required initial cue only to slow rate and demonstrated x1 cough in solids. Pt demonstrated increase attention and overall cognition compared to previous session, per chart review. Pt demonstrated sustained attention to self-feeding for 20 minutes and emerging alternating attention participating in simple conversation during consumption of meal. SLP facilitated orientation, pt orientated to place, situation and time with errors only on month/date and demonstrated delayed recall of month and date with 10 minute delay. SLP  posted calendar in room. Pt demonstrated recall of today's PT event x1, however given novel information recalling the rest of his scheduling (x2 occupational therapies) required visual aid and mod A verbal cues. SLP facilitated identification of 2 physical deficits (pt listing standing and balance) with supervision A verbal cues and pt required max A verbal cues to list 2 cognitive deficits. Pt demonstrated verbal problem solving skills recalling safety protocol to utilize call bell and to wait for assistance piror to getting out of bed. Pt fatigued 30 minutes into session requesting to lay back in bed and fell asleep 10 minutes later. Pt was left in room with call bell within reach and bed alarm set. ST recommends to continue skilled ST services.       Pain Pain Assessment Pain Scale: 0-10 Pain Score: 0-No pain  Therapy/Group: Individual Therapy  Damonte Frieson  [facility] 04/14/2019, 7:35 AM

## 2019-04-14 NOTE — Progress Notes (Signed)
Physical Therapy Session Note  Patient Details  Name: Benjamin Cohen MRN: [id number] Date of Birth: Sep 12, 1956  Today's Date: 04/14/2019 PT Individual Time: 0800-0855 PT Individual Time Calculation (min): 55 min   Short Term Goals: Week 1:  PT Short Term Goal 1 (Week 1): Patient to tolerate 10 minutes continuous activite prior to needing rest. PT Short Term Goal 2 (Week 1): Patient to perform transfers with CGA demonstrating safe technique 50% of the time. PT Short Term Goal 3 (Week 1): Patient to ambulate with LRAD and CGA x 80'.  PT Short Term Goal 4 (Week 1): Patient to negotiate 4 (6") steps with min A with rails.   Skilled Therapeutic Interventions/Progress Updates:    Pt received seated in w/c in room, agreeable to PT session. Seated vitals: BP 134/81, HR 88, SpO2 97% on room air. Pt transfers sit to stand with min A throughout session.Ambulation 4 x 50 ft with min A, occasionally ataxic during gait and fatigued following each bout of ambulation. SpO2 remains at 96% and above with gait. Nustep level 1 x 5 min with use of B UE/LE, one seated rest break. SpO2 96% with Nustep. Pt reports some L shoulder pain with movement, not rated, pt declines ice pack to shoulder at this time. Pt has incontinence of urine at end of session, pt unaware that he is urinating even once pants removed. Pt is min A for donning of new brief and pants and for pericare. Pt requests to lay down at end of session due to fatigue. Bed mobility Supervision. Pt left supine in bed with needs in reach, bed alarm in place.  Therapy Documentation Precautions:  Precautions Precautions: Fall Precaution Comments: High risk for falls/telesitter Restrictions Weight Bearing Restrictions: No   Therapy/Group: Individual Therapy   Peter Congo, PT, DPT  04/14/2019, 9:09 AM

## 2019-04-14 NOTE — Progress Notes (Signed)
Inpatient Rehabilitation  Patient information reviewed and entered into eRehab system by Johnsie Moscoso M. Miquan Tandon, M.A., CCC/SLP, PPS Coordinator.  Information including medical coding, functional ability and quality indicators will be reviewed and updated through discharge.    

## 2019-04-14 NOTE — Progress Notes (Signed)
Physical Therapy Session Note  Patient Details  Name: Benjamin Cohen MRN: [id number] Date of Birth: [birth year]  Today's Date: 04/14/2019 PT Individual Time: 0945-1015 PT Individual Time Calculation (min): 30 min   Short Term Goals: Week 1:  PT Short Term Goal 1 (Week 1): Patient to tolerate 10 minutes continuous activite prior to needing rest. PT Short Term Goal 2 (Week 1): Patient to perform transfers with CGA demonstrating safe technique 50% of the time. PT Short Term Goal 3 (Week 1): Patient to ambulate with LRAD and CGA x 80'.  PT Short Term Goal 4 (Week 1): Patient to negotiate 4 (6") steps with min A with rails.   Skilled Therapeutic Interventions/Progress Updates:    pt rec'd in bed, takes increased time to arouse but able with voice and gentle touch. Pt performs bed mobility with supervision, transfers with min A throughout session.  Gait without AD 10' x 2, 32' with CGA.  Stair negotiation x 12 stairs with bilat handrails and CGA x 2 attempts. spO2 100% after stair negotiation. Pt requires frequent rest breaks due to fatigue and weakness. Pt left in bed with all needs at hand, alarm set.  Therapy Documentation Precautions:  Precautions Precautions: Fall Precaution Comments: High risk for falls/telesitter Restrictions Weight Bearing Restrictions: No Pain:  pt c/o neck and Lt shoulder pain, hot pack applied after session   Therapy/Group: Individual Therapy  Evanee Lubrano 04/14/2019, 10:15 AM

## 2019-04-14 NOTE — Progress Notes (Signed)
PHYSICAL MEDICINE & REHABILITATION PROGRESS NOTE  Subjective/Complaints: Pt up with therapy. Fatigued by efforts but making it through.   ROS: Patient denies fever, rash, sore throat, blurred vision, nausea, vomiting, diarrhea, cough, shortness of breath or chest pain, joint or back pain, headache, or mood change.    Objective: Vital Signs: Blood pressure (!) 125/96, pulse 98, temperature 98.3 F (36.8 C), temperature source Oral, resp. rate 18, height 5\' 7"  (1.702 m), weight 81.2 kg, SpO2 100 %. No results found. Recent Labs    04/14/19 0626  WBC 4.0  HGB 10.9*  HCT 34.1*  PLT 458*   Recent Labs    04/14/19 0626  NA 140  K 3.6  CL 104  CO2 26  GLUCOSE 178*  BUN 9  CREATININE 0.88  CALCIUM 9.0    Physical Exam: BP (!) 125/96 (BP Location: Right Arm)   Pulse 98   Temp 98.3 F (36.8 C) (Oral)   Resp 18   Ht 5\' 7"  (1.702 m)   Wt 81.2 kg   SpO2 100%   BMI 28.04 kg/m  Constitutional: No distress . Vital signs reviewed. HEENT: EOMI, oral membranes moist Neck: supple Cardiovascular: RRR without murmur. No JVD    Respiratory: CTA Bilaterally without wheezes or rales. Normal effort    GI: BS +, non-tender, non-distended  Musc:  Left shoulder with tenderness at [facility] jt, cross arm maneuver. +impingement Neurological: Alert and oriented x2. Motor: Grossly grossly 4/5. Psychiatric: flat. Skin: left heel dressed. Face and neck areas with healing breakdown/granulation.   Assessment/Plan: 1. Functional deficits secondary to debility which require 3+ hours per day of interdisciplinary therapy in a comprehensive inpatient rehab setting.  Physiatrist is providing close team supervision and 24 hour management of active medical problems listed below.  Physiatrist and rehab team continue to assess barriers to discharge/monitor patient progress toward functional and medical goals  Care Tool:  Bathing  Bathing activity did not occur: Refused Body parts bathed  by patient: Face, Front perineal area, Buttocks, Right upper leg, Left upper leg         Bathing assist Assist Level: Moderate Assistance - Patient 50 - 74%     Upper Body Dressing/Undressing Upper body dressing   What is the patient wearing?: Hospital gown only    Upper body assist Assist Level: Moderate Assistance - Patient 50 - 74%    Lower Body Dressing/Undressing Lower body dressing      What is the patient wearing?: Underwear/pull up     Lower body assist Assist for lower body dressing: Minimal Assistance - Patient > 75%     Toileting Toileting    Toileting assist Assist for toileting: Moderate Assistance - Patient 50 - 74%     Transfers Chair/bed transfer  Transfers assist     Chair/bed transfer assist level: Minimal Assistance - Patient > 75%     Locomotion Ambulation   Ambulation assist      Assist level: Minimal Assistance - Patient > 75% Assistive device: No Device Max distance: 50'   Walk 10 feet activity   Assist     Assist level: Minimal Assistance - Patient > 75% Assistive device: No Device   Walk 50 feet activity   Assist Walk 50 feet with 2 turns activity did not occur: Safety/medical concerns  Assist level: Minimal Assistance - Patient > 75% Assistive device: No Device    Walk 150 feet activity   Assist Walk 150 feet activity did not occur: Safety/medical concerns  Walk 10 feet on uneven surface  activity   Assist Walk 10 feet on uneven surfaces activity did not occur: Safety/medical concerns         Wheelchair     Assist Will patient use wheelchair at discharge?: No Type of Wheelchair: Manual    Wheelchair assist level: Minimal Assistance - Patient > 75% Max wheelchair distance: 8640'    Wheelchair 50 feet with 2 turns activity    Assist            Wheelchair 150 feet activity     Assist Wheelchair 150 feet activity did not occur: Safety/medical concerns           Medical Problem List and Plan: 1.Functional deficits and impaired mobilitysecondary to debility/encephalopathy after COVID 19 infection and subsequent respiratory failure/ARDS  Continue CIR  -contact precautions for MRSA 2. Antithrombotics: -DVT/anticoagulation:Pharmaceutical:Lovenox -antiplatelet therapy: Plavix 3. Pain Management:tylenol prn  --AC jt arthritis Left shoulder, mild RTC  -voltaren gel  -ice prn 4. Mood:LCSW to follow for evaluation when appropriate. -antipsychotic agents: Continue seroquel at bedtime.  DC'd Haldol and added Seroquelprn 5. Neuropsych: This patientis notcapable of making decisions on hisown behalf.    tele-sitter for safety 6. Skin/Wound Care:routine pressure relief measures 7. Fluids/Electrolytes/Nutrition:Monitor I/O.   I personally reviewed the patient's labs today.   8. Metabolic encephalopathy/Delirium:  See #4 -sleep-wake chart 9. UJW:[id number]:Monitor BP bid --continue metoprolol for BP/HR control.  Borderline control 5/4--no changes today  Monitor with increased mobility 10. H/o CVA: OnPlavix. 11. T2DM:Hgb A1c- 12.4 --has history of non-compliance per records.  Some improvement  -on amaryl at home--resume at 1mg  today  Monitor with increased mobility 12. Dysphagia:On dysphagia 2, nectar liquids. Added full supervision for safety with intake.  LOS: 3 days A FACE TO FACE EVALUATION WAS PERFORMED  Ranelle OysterZachary T Swartz 04/14/2019, 9:44 AM

## 2019-04-14 NOTE — Consult Note (Signed)
WOC Nurse wound consult note Reason for Consult: Left heel Unstageable pressure injury Wound type:Pressure Pressure Injury POA: Yes Measurement: 3cm x 2.5cm with depth obscured by the presence of stable, black eschar Wound bed:As noted above Drainage (amount, consistency, odor) None Periwound:intact and without erythema, induration or warmth Dressing procedure/placement/frequency: The goal of topical care is to dry eschar so that it can successfully be removed or fall off, revealing intact skin.  Pressure redistribution heel boot while in bed. We will paint twice daily and allow to air dry, then cover with dry gauze secure with a few turns of Kerlix roll gauze. Prevalon boot ordered for while in bed. Also pressure redistribution chair pad.  WOC nursing team will not follow, but will remain available to this patient, the nursing and medical teams.  Please re-consult if needed. Thanks, Ladona Mow, MSN, RN, GNP, [place]  Pager# [phone]

## 2019-04-14 NOTE — Progress Notes (Signed)
Social Work  Social Work Assessment and Plan  Patient Details  Name: Benjamin Cohen MRN: [id number] Date of Birth: February 12, 1956  Today's Date: 04/14/2019  Problem List:  Patient Active Problem List   Diagnosis Date Noted  . Labile blood glucose   . Labile blood pressure   . Diabetes mellitus type 2 in nonobese (HCC)   . Essential hypertension   . Metabolic encephalopathy   . 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 Debility 04/11/2019  . Staphylococcal pneumonia (HCC)   . COVID-19   . Dysphagia   . Pressure injury of skin 03/23/2019  . Acute respiratory failure with hypoxia (HCC) 03/11/2019  . Noncompliance   . History of stroke   . Cerebral thrombosis with cerebral infarction 04/29/2018  . CVA (cerebral vascular accident) (HCC) 04/29/2018  . HLD (hyperlipidemia) 01/04/2015  . Diabetes (HCC) 11/14/2013  . Cerebral infarction (HCC) 11/12/2013  . Hypertensive urgency 11/12/2013   Past Medical History:  Past Medical History:  Diagnosis Date  . Diabetes mellitus without complication (HCC)   . Hypertension   . Stroke [facility](HCC)    Past Surgical History:  Past Surgical History:  Procedure Laterality Date  . NO PAST SURGERIES     Social History:  reports that he has never smoked. He has never used smokeless tobacco. He reports that he does not drink alcohol or use drugs.  Family / Support Systems Marital Status: Divorced Patient Roles: Parent, Other (Comment)(ex-spouse) Spouse/Significant Other: ex-wife, Benjamin Cohen [facility]([place]) @ [phone] Children: son, Benjamin Cohen @ [phone] (living with the pt) and two other adult children living locally. Anticipated Caregiver: Benjamin Cohen and her daughter  Ability/Limitations of Caregiver: Min G/Supervision (both Benjamin Cohen and her daughter have lifitng restrictions due to back issues and pregnancy) Caregiver Availability: 24/7 Family Dynamics: Ex-wife is supportive and willing to assist "as long as we can get along."     Social History Preferred language: English Religion: Non-Denominational Cultural Background: NA Read: Yes Write: Yes Employment Status: Employed Name of Employer: [employer] Return to Work Plans: Pt uncertain about return at this point. Legal History/Current Legal Issues: None Guardian/Conservator: None - per MD, pt is not capable of making decisions on his own behalf.   Abuse/Neglect Abuse/Neglect Assessment Can Be Completed: Yes Physical Abuse: Denies Verbal Abuse: Denies Sexual Abuse: Denies Exploitation of patient/patient's resources: Denies Self-Neglect: Denies  Emotional Status Pt's affect, behavior and adjustment status: Pt lying in bed and able to provide general information but answers are brief and he appears very fatigued.  He denies any significant emotional distress and no concerns about d/c.  Will monitor mood and may benefit from neuropsychology while here. Recent Psychosocial Issues: None Psychiatric History: None Substance Abuse History: None  Patient / Family Perceptions, Expectations & Goals Pt/Family understanding of illness & functional limitations: Pt and ex-wife with general understanding of his debility following COVID illness.   Premorbid pt/family roles/activities: Pt was completely independent and working f/t. Anticipated changes in roles/activities/participation: Per supervision goals, ex-wife to assume primary support role. Pt/family expectations/goals: "I just want to get home."  [employer] Community Agencies: None Premorbid Home Care/DME Agencies: None Transportation available at discharge: yes Resource referrals recommended: Neuropsychology  Discharge Planning Living Arrangements: Children Support Systems: Children, Friends/neighbors(ex-wife) Type of Residence: Private residence Insurance Resources: [employer] Entertainment[employer]icare, Media plannerrivate Insurance (specify)(UHC (primary)) Financial Resources: Employment Financial Screen Referred: No Living  Expenses: Psychologist, sport and exerciseent Money Management: Patient Does the patient have any problems obtaining your medications?: No Home Management: pt Patient/Family Preliminary  Plans: Pt and ex-wife report plan for pt to go to her home in [place]. Social Work Anticipated Follow Up Needs: HH/OP Expected length of stay: 16-20 days  Clinical Impression Unfortunate gentleman here for severe debility after contraction of COVID.  Lives locally with his adult son, however, plan for d/c is to go to ex-wife's home in [place] for a few weeks.  Pt and ex-wife with basic understanding of his medical issues and functional limitations.  Pt denies any emotional distress but will monitor while here.  Will follow for support and d/c planning needs.  Benjamin Cohen 04/14/2019, 3:56 PM

## 2019-04-14 NOTE — IPOC Note (Signed)
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   Labile blood glucose   Labile blood pressure     Functional Problem List: Nursing Behavior, Safety, Endurance, Medication Management, Nutrition  PT Balance, Endurance, Safety, Motor  OT Balance, Behavior, Cognition, Edema, Endurance, Pain, Safety, Skin Integrity  SLP Cognition, Nutrition, Behavior  TR         Basic ADL's: OT Grooming, Bathing, Dressing, Toileting     Advanced  ADL's: OT       Transfers: PT Bed Mobility, Bed to Chair, Car, [employer], Floor  OT Toilet, Research scientist (life sciences): PT Ambulation, Stairs     Additional Impairments: OT None  SLP Swallowing, Communication, Social Cognition expression Social Interaction, Problem Solving, Memory, Attention, Awareness  TR      Anticipated Outcomes Item Anticipated Outcome  Self Feeding    Swallowing  Mod I    Basic self-care  S  Toileting  S   Bathroom Transfers S  Bowel/Bladder  Pt will manage bowel and bladder with mod I assist   Transfers  S  Locomotion  S  Communication  Mod I   Cognition  Supervision-Min A   Pain  Pt will manage pain at 3 or less on a scale of 0-10.   Safety/Judgment  Pt will remain free of falls with injury while in rehab with min cues.    Therapy Plan: PT Intensity: Minimum of 1-2 x/day ,45 to 90 minutes PT Frequency: 5 out of 7 days PT Duration Estimated Length of Stay: 15-20 days OT Intensity: Minimum of 1-2 x/day, 45 to 90 minutes OT Frequency: 5 out of 7 days OT Duration/Estimated Length of Stay: 16-20 SLP Intensity: Minumum of 1-2 x/day, 30 to 90 minutes SLP Frequency: 3 to 5 out of 7 days SLP  Duration/Estimated Length of Stay: 16-20 days    Due to the current state of emergency, patients may not be receiving their 3-hours of Medicare-mandated therapy.   Team Interventions: Nursing Interventions Patient/Family Education, Dysphagia/Aspiration Precaution Training, Cognitive Remediation/Compensation, Disease Management/Prevention, Medication Management, Discharge Planning  PT interventions Balance/vestibular training, Ambulation/gait training, Cognitive remediation/compensation, DME/adaptive equipment instruction, Functional mobility training, Therapeutic Activities, UE/LE Strength taining/ROM, Therapeutic Exercise, Stair training, Patient/family education, Neuromuscular re-education  OT Interventions Warden/ranger, Community reintegration, Disease mangement/prevention, Neuromuscular re-education, Patient/family education, Self Care/advanced ADL retraining, Splinting/orthotics, Therapeutic Exercise, UE/LE Coordination activities, Wheelchair propulsion/positioning, Cognitive remediation/compensation, Discharge planning, DME/adaptive equipment instruction, Functional mobility training, Pain management, Psychosocial support, Therapeutic Activities, Skin care/wound managment, UE/LE Strength taining/ROM, Visual/perceptual remediation/compensation  SLP Interventions Cognitive remediation/compensation, Environmental controls, Internal/external aids, Speech/Language facilitation, Therapeutic Activities, Patient/family education, Functional tasks, Dysphagia/aspiration precaution training, Cueing hierarchy  TR Interventions    SW/CM Interventions Discharge Planning, Patient/Family Education, Psychosocial Support   Barriers to Discharge MD  Medical stability  Nursing      PT Home environment access/layout two level home  OT Decreased caregiver support, Home environment access/layout flight of stairs  SLP      SW       Team Discharge Planning: Destination: PT-Home ,OT- Home ,  SLP-Home Projected Follow-up: PT-Home health PT, 24 hour supervision/assistance, OT-  Home health OT, SLP-24 hour supervision/assistance, Home Health SLP Projected Equipment Needs: PT-Rolling walker with 5"  wheels, OT- 3 in 1 bedside comode, Tub/shower bench, To be determined, SLP-To be determined Equipment Details: PT- , OT-  Patient/family involved in discharge planning: PT- Patient,  OT-Patient, SLP-Patient  MD ELOS: 15-20 days Medical Rehab Prognosis:  Excellent Assessment: The patient has been admitted for CIR therapies with the diagnosis of debility/encephalopathy. The team will be addressing functional mobility, strength, stamina, balance, safety, adaptive techniques and equipment, self-care, bowel and bladder mgt, patient and caregiver education, NMR, cognition, communication, pain mgt. Goals have been set at supervision for self-care, mobility and supervision to min assist with cognition. .   Due to the current state of emergency, patients may not be receiving their 3 hours per day of Medicare-mandated therapy.    Ranelle OysterZachary T. Jacqualyn Sedgwick, MD, FAAPMR      See Team Conference Notes for weekly updates to the plan of care

## 2019-04-15 ENCOUNTER — Inpatient Hospital Stay (HOSPITAL_COMMUNITY): Payer: 59 | Admitting: Occupational Therapy

## 2019-04-15 ENCOUNTER — Inpatient Hospital Stay (HOSPITAL_COMMUNITY): Payer: 59 | Admitting: Physical Therapy

## 2019-04-15 ENCOUNTER — Inpatient Hospital Stay (HOSPITAL_COMMUNITY): Payer: 59 | Admitting: Speech Pathology

## 2019-04-15 LAB — GLUCOSE, CAPILLARY
Glucose-Capillary: 147 mg/dL — ABNORMAL HIGH (ref 70–99)
Glucose-Capillary: 163 mg/dL — ABNORMAL HIGH (ref 70–99)
Glucose-Capillary: 99 mg/dL (ref 70–99)
Glucose-Capillary: 139 mg/dL — ABNORMAL HIGH (ref 70–99)

## 2019-04-15 MED ORDER — QUETIAPINE FUMARATE 50 MG PO TABS
50.0000 mg | ORAL_TABLET | Freq: Every day | ORAL | Status: DC
  Administered 2019-04-15 – 2019-04-25 (×11): 50 mg via ORAL
  Filled 2019-04-15 (×11): qty 1

## 2019-04-15 NOTE — Progress Notes (Signed)
Occupational Therapy Session Note  Patient Details  Name: Benjamin Cohen MRN: [id number] Date of Birth: 11-21-1956  Today's Date: 04/15/2019 OT Individual Time: 1050-1140 OT Individual Time Calculation (min): 50 min  and Today's Date: 04/15/2019 OT Missed Time: 10 Minutes Missed Time Reason: Patient fatigue   Short Term Goals: Week 1:  OT Short Term Goal 1 (Week 1): Pt will participate in functional task for 7 min prior to needing rest break to improve endurance OT Short Term Goal 2 (Week 1): Pt will sit to stand wiht CGA OT Short Term Goal 3 (Week 1): Pt will reach back prior to sitting to demo improved safety awareness wiht no VC OT Short Term Goal 4 (Week 1): Pt will stand for 1 grooming items for improved endurance OT Short Term Goal 5 (Week 1): Pt will don shirt wiht min A and min VC for compensatory strategies PRN  Skilled Therapeutic Interventions/Progress Updates:    Therapist arrived for scheduled session at 9:30. Pt sleeping soundly in bed and difficult to arouse. He did nod head when therapist asked if he would like her to return at later time. Pt left resting in bed with bed alarm on, all needs in reach.   Therapist returned at 1050, pt sitting EOB and agreeable to tx session. Throughout session, pt denied fatigue, hwoever, would rest arms and head on supportive structure (I.e. table in front of him) whenever opportunity presented itself. He ambulated short distances with min HHA, occasional mod A for balance. He ambulated to sink and completed oral care in standing with reminders not to swallow water 2/2 being on nectar thick liquids per SLP. Encouraged pt to stand to complete task, however, pt complained of back discomfort as reason to not tolerate standing.  Therapist offered several times throughout session to alert RN of need for pain medications, however, pt declined with each offer. RN made aware of pt's complaints at end of session.He sat to brush teeth with supervision. Pt  taken to therap gym total A in w/c for time and energy conservation. He transferred onto therapy mat min A. Completed peg board activity, replicating moderately complex patterns. He initially required mod questioning cues fading to min to find and self correct errors, completed with increased time. Encouraged pt to attempt trials in standing, however, pt tolerating <1 minute in standing before returning to sitting. He would not verbalize need for rest break, however, would suddenly just sit.  Following seated rest break, pt ambulated 54ft with min HHA, w/c follow behind for safety as pt again did not acknowledge need for seated rest break and declined therapist when seated rest break required and pt abruptly sat following ambulation trial. Pt taken back to room at end of session, left seated in w/c with all needs in reach, safety belt alarm on. Pt missed 10 min of skilled tx 2/2 fatigue.   Therapy Documentation Precautions:  Precautions Precautions: Fall Precaution Comments: High risk for falls/telesitter Restrictions Weight Bearing Restrictions: No   Therapy/Group: Individual Therapy  Benjamin Cohen L 04/15/2019, 6:59 AM

## 2019-04-15 NOTE — Progress Notes (Signed)
Physical Therapy Session Note  Patient Details  Name: Benjamin Cohen MRN: [id number] Date of Birth: 05/27/1956  Today's Date: 04/15/2019 PT Individual Time: 0800-0855; 1510-1540 PT Individual Time Calculation (min): 55 min and 30 min  Short Term Goals: Week 1:  PT Short Term Goal 1 (Week 1): Patient to tolerate 10 minutes continuous activite prior to needing rest. PT Short Term Goal 2 (Week 1): Patient to perform transfers with CGA demonstrating safe technique 50% of the time. PT Short Term Goal 3 (Week 1): Patient to ambulate with LRAD and CGA x 80'.  PT Short Term Goal 4 (Week 1): Patient to negotiate 4 (6") steps with min A with rails.   Skilled Therapeutic Interventions/Progress Updates:    Session 1: Pt received seated in w/c in room, agreeable to PT session. No complaints of pain. SpO2 99% at rest on room air. Sit to stand with min A from w/c. Ascend/descend 8 stairs with 2 handrails and CGA, x 2 reps. SpO2 94% after performing stairs on room air. Education with patient about purpose of checking O2 saturation and parameters, pt able to recall later in session that 90% or higher is the number we are looking for. Ambulation x 45', x 60', x 90', x 120' with no AD and min A, several standing rest breaks during gait. Pt has increase in ataxia with onset of fatigue. Pt exhibits impulsiveness throughout session attempting to sit in w/c before he is fully backed up to it as well as delayed processing when given cues. Manual w/c propulsion x 100 ft with use of BUE and Supervision, w/c veers to the L either due to LUE weakness. Pt requesting a hot pack for L shoulder at end of session, continues to deny pain. Pt requesting to return to bed at end of session due to fatigue. Bed mobility Supervision. Pt left sidelying in bed with needs in reach, bed alarm in place, telesitter present, hot pack to L shoulder.  Session 2: Pt received seated on toilet upon therapist arrival, agreeable to PT session. No  complaints of pain. Pt impulsive and standing up from toilet without assist, reminded pt to call for assist before standing up. Ambulation to wheelchair x 20 ft with min A. Manual w/c propulsion x 50 ft with use of BUE and Supervision. Standing tolerance 3 x 1 min while performing ball toss. Pt reporting feeling significantly fatigued this PM. Education with patient about healing process and rebuilding endurance and that it is normal to feel fatigued with his diagnosis. Pt requesting water, provided pt with nectar thick water which he is able to safely drink with minor cueing for small sips, no coughing. Pt agreeable to stay seated in w/c at end of session. Pt left in w/c with needs in reach, quick release belt and chair alarm in place, telesitter present.   Therapy Documentation Precautions:  Precautions Precautions: Fall Precaution Comments: High risk for falls/telesitter Restrictions Weight Bearing Restrictions: No    Therapy/Group: Individual Therapy   Peter Congo, PT, DPT  04/15/2019, 8:59 AM

## 2019-04-15 NOTE — Progress Notes (Signed)
Speech Language Pathology Daily Session Note  Patient Details  Name: Benjamin Cohen MRN: [id number] Date of Birth: [birth year]  Today's Date: 04/15/2019 SLP Individual Time: 1300-1355 SLP Individual Time Calculation (min): 55 min  Short Term Goals: Week 1: SLP Short Term Goal 1 (Week 1): Patient will demosntrate sustained attention to functional tasks for ~8 minutes with Mod A verbal cues for redirction.  SLP Short Term Goal 2 (Week 1): Patient will demonstrate functional problem solving for basic and familiar tasks with Mod A verbal cues.  SLP Short Term Goal 3 (Week 1): Patient will utilize external aids to maximize recall of functional information with Mod A multmodal cues.  SLP Short Term Goal 4 (Week 1): Patient will identify 2 physical and 2 cognitive deficits with Mod A multimodal cues.  SLP Short Term Goal 5 (Week 1): Patient will consume current diet with minimal overt s/s of aspiration with Min verbal cus for use of swallowing strategies.  SLP Short Term Goal 6 (Week 1): Patient will consume trials of ice chips/thin liquids over 3 sessions with minimal overt s/s of aspiration and Min A verbal cues to assess readiness for repeat MBS.   Skilled Therapeutic Interventions: Skilled treatment session focused on dysphagia and cognitive goals. SLP facilitated session by providing trials of ice chips after oral care. Patient had intermittent poor awareness of bolus due to talking which led to a suspected delayed swallow initiation and overt cough X 1. Recommend continued trials with SLP only. SLP also administered the MoCA-Basic in which patient scored 14/30 points with a score of 26 or above considered normal. Patient demonstrated deficits in executive functioning, problem solving, attention, short-term recall and reasoning. Patient educated on results of test and verbalized understanding. However, patient did appear more awake and attentive with decreased restlessness noted compared to the last time  this clinician saw the patient. Patient left upright in bed with alarm on and all needs within reach. Continue with current plan of care.      Pain No/Denies Pain   Therapy/Group: Individual Therapy  Arlynn Mcdermid 04/15/2019, 1:56 PM

## 2019-04-15 NOTE — Progress Notes (Signed)
[place] PHYSICAL MEDICINE & REHABILITATION PROGRESS NOTE  Subjective/Complaints: Restless last night, couldn't get comfortable. Breathing ok.    Objective: Vital Signs: Blood pressure (!) 139/93, pulse (!) 101, temperature 98.5 F (36.9 C), temperature source Oral, resp. rate 18, height 5\' 7"  (1.702 m), weight 81.4 kg, SpO2 99 %. No results found. Recent Labs    04/14/19 0626  WBC 4.0  HGB 10.9*  HCT 34.1*  PLT 458*   Recent Labs    04/14/19 0626  NA 140  K 3.6  CL 104  CO2 26  GLUCOSE 178*  BUN 9  CREATININE 0.88  CALCIUM 9.0    Physical Exam: BP (!) 139/93 (BP Location: Right Arm)   Pulse (!) 101   Temp 98.5 F (36.9 C) (Oral)   Resp 18   Ht 5\' 7"  (1.702 m)   Wt 81.4 kg   SpO2 99%   BMI 28.11 kg/m  Constitutional: No distress . Vital signs reviewed. HEENT: EOMI, oral membranes moist Neck: supple Cardiovascular: RRR without murmur. No JVD    Respiratory: CTA Bilaterally without wheezes or rales. Normal effort    GI: BS +, non-tender, non-distended  Musc:  Left shoulder with [facility], cross arm maneuver. +impingement Neurological: Alert and oriented x2. Motor: Grossly grossly 4/5. Psychiatric: flat. Skin: left heel dressed. Face and neck areas with healing breakdown/granulation.   Assessment/Plan: 1. Functional deficits secondary to debility which require 3+ hours per day of interdisciplinary therapy in a comprehensive inpatient rehab setting.  Physiatrist is providing close team supervision and 24 hour management of active medical problems listed below.  Physiatrist and rehab team continue to assess barriers to discharge/monitor patient progress toward functional and medical goals  Care Tool:  Bathing  Bathing activity did not occur: Refused Body parts bathed by patient: Face, Front perineal area, Buttocks, Right upper leg, Left upper leg, Right arm, Left arm, Right lower leg, Chest, Abdomen, Left lower leg         Bathing assist  Assist Level: Contact Guard/Touching assist     Upper Body Dressing/Undressing Upper body dressing   What is the patient wearing?: Pull over shirt    Upper body assist Assist Level: Independent    Lower Body Dressing/Undressing Lower body dressing      What is the patient wearing?: Pants, Incontinence brief     Lower body assist Assist for lower body dressing: Minimal Assistance - Patient > 75%     Toileting Toileting    Toileting assist Assist for toileting: Moderate Assistance - Patient 50 - 74%     Transfers Chair/bed transfer  Transfers assist     Chair/bed transfer assist level: Minimal Assistance - Patient > 75%     Locomotion Ambulation   Ambulation assist      Assist level: Minimal Assistance - Patient > 75% Assistive device: No Device Max distance: 120'   Walk 10 feet activity   Assist     Assist level: Minimal Assistance - Patient > 75% Assistive device: No Device   Walk 50 feet activity   Assist Walk 50 feet with 2 turns activity did not occur: Safety/medical concerns  Assist level: Minimal Assistance - Patient > 75% Assistive device: No Device    Walk 150 feet activity   Assist Walk 150 feet activity did not occur: Safety/medical concerns         Walk 10 feet on uneven surface  activity   Assist Walk 10 feet on uneven surfaces activity did not  occur: Safety/medical concerns         Wheelchair     Assist Will patient use wheelchair at discharge?: No Type of Wheelchair: Manual    Wheelchair assist level: Supervision/Verbal cueing Max wheelchair distance: 100'    Wheelchair 50 feet with 2 turns activity    Assist        Assist Level: Supervision/Verbal cueing   Wheelchair 150 feet activity     Assist Wheelchair 150 feet activity did not occur: Safety/medical concerns          Medical Problem List and Plan: 1.Functional deficits and impaired mobilitysecondary to debility/encephalopathy  after COVID 19 infection and subsequent respiratory failure/ARDS  Continue CIR, team conference today  -contact precautions for MRSA 2. Antithrombotics: -DVT/anticoagulation:Pharmaceutical:Lovenox -antiplatelet therapy: Plavix 3. Pain Management:tylenol prn  --AC jt arthritis Left shoulder, mild RTC  -voltaren gel  -ice prn 4. Mood:LCSW to follow for evaluation when appropriate. -antipsychotic agents: increase seroquel qhs to 50mg    Seroquelprn 5. Neuropsych: This patientis notcapable of making decisions on hisown behalf.    tele-sitter for safety 6. Skin/Wound Care:routine pressure relief measures 7. Fluids/Electrolytes/Nutrition:Monitor I/O.   Encourage PO  -intake 100%. Not sure intake has been recorded consistently 8. Metabolic encephalopathy/Delirium:  See #4 -improving 9. EYC:XKGYJEH BP bid --continue metoprolol for BP/HR control.  Borderline control 5/5, no changes yet 10. H/o CVA: OnPlavix. 11. T2DM:Hgb A1c- 12.4 --has history of non-compliance per records.  Some improvement  -on amaryl at home--resumedat 1mg   5/4  Monitor with increased mobility 12. Dysphagia:On dysphagia 2, nectar liquids.   full supervision for safety with intake.  LOS: 4 days A FACE TO FACE EVALUATION WAS PERFORMED  Ranelle Oyster 04/15/2019, 9:06 AM

## 2019-04-16 ENCOUNTER — Inpatient Hospital Stay (HOSPITAL_COMMUNITY): Payer: 59 | Admitting: Physical Therapy

## 2019-04-16 ENCOUNTER — Inpatient Hospital Stay (HOSPITAL_COMMUNITY): Payer: 59 | Admitting: *Deleted

## 2019-04-16 ENCOUNTER — Inpatient Hospital Stay (HOSPITAL_COMMUNITY): Payer: 59 | Admitting: Speech Pathology

## 2019-04-16 ENCOUNTER — Inpatient Hospital Stay (HOSPITAL_COMMUNITY): Payer: 59 | Admitting: Occupational Therapy

## 2019-04-16 LAB — GLUCOSE, CAPILLARY
Glucose-Capillary: 135 mg/dL — ABNORMAL HIGH (ref 70–99)
Glucose-Capillary: 106 mg/dL — ABNORMAL HIGH (ref 70–99)
Glucose-Capillary: 126 mg/dL — ABNORMAL HIGH (ref 70–99)
Glucose-Capillary: 131 mg/dL — ABNORMAL HIGH (ref 70–99)

## 2019-04-16 NOTE — Progress Notes (Signed)
[place] PHYSICAL MEDICINE & REHABILITATION PROGRESS NOTE  Subjective/Complaints: No new complaints other than having to wait for breakfast. Left shoulder better  ROS: Patient denies fever, rash, sore throat, blurred vision, nausea, vomiting, diarrhea, cough,    headache, or mood change.    Objective: Vital Signs: Blood pressure (!) 151/91, pulse 93, temperature 98.3 F (36.8 C), temperature source Oral, resp. rate 17, height 5\' 7"  (1.702 m), weight 85.4 kg, SpO2 98 %. No results found. Recent Labs    04/14/19 0626  WBC 4.0  HGB 10.9*  HCT 34.1*  PLT 458*   Recent Labs    04/14/19 0626  NA 140  K 3.6  CL 104  CO2 26  GLUCOSE 178*  BUN 9  CREATININE 0.88  CALCIUM 9.0    Physical Exam: BP (!) 151/91 (BP Location: Right Arm)   Pulse 93   Temp 98.3 F (36.8 C) (Oral)   Resp 17   Ht 5\' 7"  (1.702 m)   Wt 85.4 kg   SpO2 98%   BMI 29.49 kg/m  Constitutional: No distress . Vital signs reviewed. HEENT: EOMI, oral membranes moist Neck: supple Cardiovascular: RRR without murmur. No JVD    Respiratory: CTA Bilaterally without wheezes or rales. Fair air movement GI: BS +, non-tender, non-distended  Musc:  Left shoulder much less tender at ACjt and with ER/IR. Neurological: Alert and oriented to person, month, place, reason Motor: Grossly grossly 4/5. Fair coordination Psychiatric: flat.more engaging though,  Skin: left heel dressed. Face and neck areas with healing breakdown/granulation.   Assessment/Plan: 1. Functional deficits secondary to debility which require 3+ hours per day of interdisciplinary therapy in a comprehensive inpatient rehab setting.  Physiatrist is providing close team supervision and 24 hour management of active medical problems listed below.  Physiatrist and rehab team continue to assess barriers to discharge/monitor patient progress toward functional and medical goals  Care Tool:  Bathing  Bathing activity did not occur: Refused Body  parts bathed by patient: Face, Front perineal area, Buttocks, Right upper leg, Left upper leg, Right arm, Left arm, Right lower leg, Chest, Abdomen, Left lower leg         Bathing assist Assist Level: Contact Guard/Touching assist     Upper Body Dressing/Undressing Upper body dressing   What is the patient wearing?: Pull over shirt    Upper body assist Assist Level: Independent    Lower Body Dressing/Undressing Lower body dressing      What is the patient wearing?: Pants, Incontinence brief     Lower body assist Assist for lower body dressing: Minimal Assistance - Patient > 75%     Toileting Toileting    Toileting assist Assist for toileting: Moderate Assistance - Patient 50 - 74%     Transfers Chair/bed transfer  Transfers assist     Chair/bed transfer assist level: Minimal Assistance - Patient > 75%     Locomotion Ambulation   Ambulation assist      Assist level: Minimal Assistance - Patient > 75% Assistive device: No Device Max distance: 120'   Walk 10 feet activity   Assist     Assist level: Minimal Assistance - Patient > 75% Assistive device: No Device   Walk 50 feet activity   Assist Walk 50 feet with 2 turns activity did not occur: Safety/medical concerns  Assist level: Minimal Assistance - Patient > 75% Assistive device: No Device    Walk 150 feet activity   Assist Walk 150 feet activity did not occur:  Safety/medical concerns         Walk 10 feet on uneven surface  activity   Assist Walk 10 feet on uneven surfaces activity did not occur: Safety/medical concerns         Wheelchair     Assist Will patient use wheelchair at discharge?: No Type of Wheelchair: Manual    Wheelchair assist level: Supervision/Verbal cueing Max wheelchair distance: 100'    Wheelchair 50 feet with 2 turns activity    Assist        Assist Level: Supervision/Verbal cueing   Wheelchair 150 feet activity     Assist  Wheelchair 150 feet activity did not occur: Safety/medical concerns          Medical Problem List and Plan: 1.Functional deficits and impaired mobilitysecondary to debility/encephalopathy after COVID 19 infection and subsequent respiratory failure/ARDS  -Continue CIR therapies including PT, OT, and SLP   -contact precautions for MRSA 2. Antithrombotics: -DVT/anticoagulation:Pharmaceutical:Lovenox -antiplatelet therapy: Plavix 3. Pain Management:tylenol prn  --AC jt arthritis Left shoulder, mild RTC  -voltaren gel  -ice prn  -has improved 4. Mood:LCSW to follow for evaluation when appropriate. -antipsychotic agents: increased seroquel qhs to 50mg  for better sleep  Seroquelprn 5. Neuropsych: This patientis notcapable of making decisions on hisown behalf.    tele-sitter for safety 6. Skin/Wound Care:routine pressure relief measures 7. Fluids/Electrolytes/Nutrition:Monitor I/O.   Encourage PO  -appears to be eating well, good appetite 8. Metabolic encephalopathy/Delirium:  See #4 -improving 9. WPV:XYIAXKP BP bid --continue metoprolol for BP/HR control.  Borderline control 5/5, no changes yet 10. H/o CVA: OnPlavix. 11. T2DM:Hgb A1c- 12.4 --has history of non-compliance per records.  -on amaryl at home--resumedat 1mg  daily  5/4  Sugars appear to be under reasonable control 12. Dysphagia:On dysphagia 2, nectar liquids.     -full supervision for safety with intake, probably can relax supervision order  LOS: 5 days A FACE TO FACE EVALUATION WAS PERFORMED  Ranelle Oyster 04/16/2019, 9:07 AM

## 2019-04-16 NOTE — Progress Notes (Signed)
Speech Language Pathology Daily Session Note  Patient Details  Name: Benjamin Cohen MRN: [id number] Date of Birth: 1956-10-12  Today's Date: 04/16/2019 SLP Individual Time: 0920-1015 SLP Individual Time Calculation (min): 55 min  Short Term Goals: Week 1: SLP Short Term Goal 1 (Week 1): Patient will demosntrate sustained attention to functional tasks for ~8 minutes with Mod A verbal cues for redirction.  SLP Short Term Goal 2 (Week 1): Patient will demonstrate functional problem solving for basic and familiar tasks with Mod A verbal cues.  SLP Short Term Goal 3 (Week 1): Patient will utilize external aids to maximize recall of functional information with Mod A multmodal cues.  SLP Short Term Goal 4 (Week 1): Patient will identify 2 physical and 2 cognitive deficits with Mod A multimodal cues.  SLP Short Term Goal 5 (Week 1): Patient will consume current diet with minimal overt s/s of aspiration with Min verbal cus for use of swallowing strategies.  SLP Short Term Goal 6 (Week 1): Patient will consume trials of ice chips/thin liquids over 3 sessions with minimal overt s/s of aspiration and Min A verbal cues to assess readiness for repeat MBS.   Skilled Therapeutic Interventions: Skilled treatment session focused on dysphagia and cognitive goals. Patient performed oral care at the sink while sitting in his wheelchair. Patient consumed trials of ice chips and small sips of thin liquids without overt s/s fo aspiration and intermittent use of multiple swallows. Recommend continued trials with SLP. SLP also facilitated session by providing overall Mod-Max A verbal cues for problem solving and sustained attention to a basic money management task. Patient appeared more fatigued this session with increased restlessness and need for rest breaks. Patient left upright in bed with alarm on and all needs within reach. Continue with current plan of care.      Pain No/Denies Pain   Therapy/Group: Individual  Therapy  Mildreth Reek 04/16/2019, 2:06 PM

## 2019-04-16 NOTE — Progress Notes (Signed)
Iintervals of anxiousness because he was unable to reach his family member, states he need to get out of here, Reassurance provided and reinforced safety measures, cooperative. Tele-sitter camera continue. Assisted as needed, po meds administered. Continue regime, bed alarm and call bell and Contact Isolation. Marland Kitchen

## 2019-04-16 NOTE — Progress Notes (Signed)
Recreational Therapy Session Note  Patient Details  Name: SUFYAAN BROWDER MRN: [id number] Date of Birth: [birth year] Today's Date: 04/16/2019   Attempted eval completion today.  Pt somewhat restless but did participate in brief leisure assessment.  Pt closing eyes through the session but responsive when using his name.  Pt stated he was tired.  Assisted pt with repositioning.  Will attempt eval completion tomorrow.  Salihah Peckham 04/16/2019, 4:15 PM

## 2019-04-16 NOTE — Progress Notes (Signed)
Occupational Therapy Session Note  Patient Details  Name: Benjamin Cohen MRN: [id number] Date of Birth: August 26, 1956  Today's Date: 04/16/2019 OT Individual Time: 0730-0830 OT Individual Time Calculation (min): 60 min    Short Term Goals: Week 1:  OT Short Term Goal 1 (Week 1): Pt will participate in functional task for 7 min prior to needing rest break to improve endurance OT Short Term Goal 2 (Week 1): Pt will sit to stand wiht CGA OT Short Term Goal 3 (Week 1): Pt will reach back prior to sitting to demo improved safety awareness wiht no VC OT Short Term Goal 4 (Week 1): Pt will stand for 1 grooming items for improved endurance OT Short Term Goal 5 (Week 1): Pt will don shirt wiht min A and min VC for compensatory strategies PRN  Skilled Therapeutic Interventions/Progress Updates:    Pt seen for OT session focusing on ADL re-training, functional activity tolerance and UE ROM. Pt awake sitting up in recliner upon arrival, demanding breakfast tray, upset it hasn't arrived yet. Therapist retrieved meal tray and provided the full supervision for meal pt require per SLP orders. While pt eating breakfast, discussed rehab goals and d/c planning. Pt ageeable with d/ date of next Saturday and acknowledges improvements being made while on rehab unit. Following breakfast, he ambulated to sink with CA COmpleted oral care standing at sink, one seated rest break required and VCs not to swallow water 2/2 being on nectar thick diet. He declined bathing this session, agreeable to changing dirty shirt however. Completed with set-up/supervision and rest breaks required just with donning of shirt. Pt cont with complaints of L shoulder stiffness and soreness. Provided scapular mobilization and massage. Education and return demonstration for self ROM exercises. However, due to fatigue and poor attention, pt requiring max cuing and with little carryover of education of exercises. Completed x10 self ROM for shoulder  flexion. He ambulated into bathroom with CGA and completed toileting task with steadying assist. Returned to sink to complete hand hygiene, heavy UE support on sink ledge 2/2 fatigue. Pt requesting to return to bed at end of session, left in supine with all needs in reach and bed alarm on.  Extended seated rest break required during and btwn all functional tasks and mobility.  Pt spoke of his endurance and attention deficits during session demonstrating improved awareness and insight into deficits.   Therapy Documentation Precautions:  Precautions Precautions: Fall Precaution Comments: High risk for falls/telesitter Restrictions Weight Bearing Restrictions: (P) No   Therapy/Group: Individual Therapy  Shyanna Klingel L 04/16/2019, 6:58 AM

## 2019-04-16 NOTE — Progress Notes (Signed)
Physical Therapy Session Note  Patient Details  Name: Benjamin Cohen MRN: [id number] Date of Birth: [birth year]  Today's Date: 04/16/2019 PT Individual Time: 1330-1430 PT Individual Time Calculation (min): 60 min   Short Term Goals: Week 1:  PT Short Term Goal 1 (Week 1): Patient to tolerate 10 minutes continuous activite prior to needing rest. PT Short Term Goal 2 (Week 1): Patient to perform transfers with CGA demonstrating safe technique 50% of the time. PT Short Term Goal 3 (Week 1): Patient to ambulate with LRAD and CGA x 80'.  PT Short Term Goal 4 (Week 1): Patient to negotiate 4 (6") steps with min A with rails.   Skilled Therapeutic Interventions/Progress Updates:    Pt received supine in bed, agreeable to PT session. No complaints of pain at rest, does have onset of L shoulder pain during session, requests hot pack to shoulder for pain management. Bed mobility Supervision. Pt is able to don pants while seated EOB with setup A. Sit to stand with CGA. Ambulation x 150', x 300' with min A, SpO2 99% on room air with activity. Pt exhibits mild ataxia with gait and occasionally increases his gait speed resulting in increase in ataxia and decrease in overall balance and safety. Pt unable to state why he increased gait speed other than he felt like it. Static standing balance with normal stance and Romberg stance: bean bag toss with CGA for balance. Biodex limits of stability level 1 with BUE support and CGA: pt scores 57% and 86% on task of multidirectional weight shift following visual cues with v/c for correct technique. Pt has incontinence of urine at end of session and is unaware it has occurred. Pt refuses to don brief despite education about protecting his clothing and bedsheets from needing to be changed if incontinence occurs again. Pt is setup A to change pants and perform pericare. Pt left seated in bed with needs in reach, bed alarm in place, telesitter present at end of session.  Therapy  Documentation Precautions:  Precautions Precautions: Fall Precaution Comments: High risk for falls/telesitter Restrictions Weight Bearing Restrictions: No    Therapy/Group: Individual Therapy   Peter Congo, PT, DPT  04/16/2019, 3:58 PM

## 2019-04-16 NOTE — Patient Care Conference (Signed)
Inpatient RehabilitationTeam Conference and Plan of Care Update Date: 04/15/2019   Time: 2:15 PM    Patient Name: Benjamin Cohen      Medical Record Number: [id number]  Date of Birth: August 29, 1956 Sex: Male         Room/Bed: 4W17C/4W17C-01 Payor Info: Payor: [employer] / Plan: [employer] OTHER / Product Type: *No Product type* /    Admitting Diagnosis: resp failure  Admit Date/Time:  04/11/2019  4:15 PM Admission Comments: No comment available   Primary Diagnosis:  <principal problem not specified> Principal Problem: <principal problem not specified>  Patient Active Problem List   Diagnosis Date Noted  . Labile blood glucose   . Labile blood pressure   . Diabetes mellitus type 2 in nonobese (HCC)   . Essential hypertension   . Metabolic encephalopathy   . 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 Debility 04/11/2019  . Staphylococcal pneumonia (HCC)   . COVID-19   . Dysphagia   . Pressure injury of skin 03/23/2019  . Acute respiratory failure with hypoxia (HCC) 03/11/2019  . Noncompliance   . History of stroke   . Cerebral thrombosis with cerebral infarction 04/29/2018  . CVA (cerebral vascular accident) (HCC) 04/29/2018  . HLD (hyperlipidemia) 01/04/2015  . Diabetes (HCC) 11/14/2013  . Cerebral infarction (HCC) 11/12/2013  . Hypertensive urgency 11/12/2013    Expected Discharge Date: Expected Discharge Date: 04/26/19  Team Members Present: Physician leading conference: Dr. Faith RogueZachary Swartz Social Worker Present: Amada JupiterLucy Zarion Oliff, LCSW Nurse Present: Nigel SloopElaine Hanner, LPN PT Present: Other (comment)(Taylor Serita Griturkalo, PT) OT Present: Amy Rounds, OT SLP Present: Feliberto Gottronourtney Payne, SLP PPS Coordinator present : Fae PippinMelissa Bowie     Current Status/Progress Goal Weekly Team Focus  Medical   debility after COVID, encephalopathy. multiple wounds. left shoulder OA  improve activity tolerance  wound care, pain, respiratory tolerance   Bowel/Bladder   Contient of Bladder/Bowel LBM5/3/20, has prn and schedule stoo  Maintain continence  QS and prn assessment of toileting need   Swallow/Nutrition/ Hydration   Dys. 2 textures with nectar-thick liquids, Full supervision  Supervision  tolerance of current diet, use of strategies, trials of advanced solids/liquids    ADL's   HHA ambulation and functional transfers. Steadying assist UB/LB bathing and dressing, VCs for safety awareness and attention to task  Supervision overall  Functional activity tolerance, safety awareness, ADL re-training, d/c planning   Mobility   Supervision bed mobility, min A transfers and gait up to 50' with no AD, min A 3" stairs  Supervision overall  endurance, safety, balance   Communication             Safety/Cognition/ Behavioral Observations  Mod A  Supervision  problem solving, recall with use of aids, awareness and attention    Pain   No pain med at this time , has PRN   < 2 min  QS and prn with reassessment    Skin   Faciial area and neck healing well open to air, Left heel with darl eschar new orders- WOC for dressing change BID, no odor or drainage     QS assessment and prn with dressing changes     Rehab Goals Patient on target to meet rehab goals: Yes *See Care Plan and progress notes for long and short-term goals.     Barriers to Discharge  Current Status/Progress Possible Resolutions Date Resolved   Physician    Medical stability        see medical  MD notes      Nursing                  PT  Home environment access/layout  two level home              OT                  SLP                SW                Discharge Planning/Teaching Needs:  Plan for family to provide 24/7 assistance.  Teaching still to be planned    Team Discussion:  Debility post COVID and encephalopathy (improving?).  Good po intake;  Wound issues on heel.  Chronic shoulder pain but MD does not plan to inject.  Min assist overall with supervision goals very  deconditioned.  Less restless today and more attentive.  ST plans another MBS prior to d/c.    Revisions to Treatment Plan:  NA    Continued Need for Acute Rehabilitation Level of Care: The patient requires daily medical management by a physician with specialized training in physical medicine and rehabilitation for the following conditions: Daily direction of a multidisciplinary physical rehabilitation program to ensure safe treatment while eliciting the highest outcome that is of practical value to the patient.: Yes Daily medical management of patient stability for increased activity during participation in an intensive rehabilitation regime.: Yes Daily analysis of laboratory values and/or radiology reports with any subsequent need for medication adjustment of medical intervention for : Post surgical problems;Neurological problems;Wound care problems   I attest that I was present, lead the team conference, and concur with the assessment and plan of the team.   Onika Gudiel 04/16/2019, 11:01 AM   Team conference was held via web/ teleconference due to COVID - 19

## 2019-04-17 ENCOUNTER — Inpatient Hospital Stay (HOSPITAL_COMMUNITY): Payer: 59 | Admitting: Speech Pathology

## 2019-04-17 ENCOUNTER — Inpatient Hospital Stay (HOSPITAL_COMMUNITY): Payer: 59 | Admitting: Occupational Therapy

## 2019-04-17 ENCOUNTER — Inpatient Hospital Stay (HOSPITAL_COMMUNITY): Payer: 59 | Admitting: Physical Therapy

## 2019-04-17 LAB — GLUCOSE, CAPILLARY
Glucose-Capillary: 144 mg/dL — ABNORMAL HIGH (ref 70–99)
Glucose-Capillary: 110 mg/dL — ABNORMAL HIGH (ref 70–99)
Glucose-Capillary: 125 mg/dL — ABNORMAL HIGH (ref 70–99)
Glucose-Capillary: 103 mg/dL — ABNORMAL HIGH (ref 70–99)

## 2019-04-17 MED ORDER — METOPROLOL TARTRATE 50 MG PO TABS
50.0000 mg | ORAL_TABLET | Freq: Two times a day (BID) | ORAL | Status: DC
  Administered 2019-04-17 – 2019-04-26 (×18): 50 mg via ORAL
  Filled 2019-04-17 (×19): qty 1

## 2019-04-17 NOTE — Progress Notes (Signed)
Physical Therapy Session Note  Patient Details  Name: Benjamin Cohen MRN: [id number] Date of Birth: [birth year]  Today's Date: 04/17/2019 PT Individual Time: 1115-1200 PT Individual Time Calculation (min): 45 min   Short Term Goals: Week 1:  PT Short Term Goal 1 (Week 1): Patient to tolerate 10 minutes continuous activite prior to needing rest. PT Short Term Goal 2 (Week 1): Patient to perform transfers with CGA demonstrating safe technique 50% of the time. PT Short Term Goal 3 (Week 1): Patient to ambulate with LRAD and CGA x 80'.  PT Short Term Goal 4 (Week 1): Patient to negotiate 4 (6") steps with min A with rails.   Skilled Therapeutic Interventions/Progress Updates:    Pt received seated EOB attempting to get up with bed alarm going off, agreeable to PT session. See pain details below. Pt is setup A to don socks while seated EOB. Pt transfers with CGA throughout session. Pt is able to brush his teeth while seated at the sink independently. Manual w/c propulsion x 150 ft with use of BUE and Supervision. Ambulation 2 x 150 ft with no AD, min A, minor ataxia and several standing rest breaks. Pt on room air throughout session, SpO2 remains at 96% and above with activity. Nustep level 3 x 10 min with use of B UE/LE for global endurance training, 2 seated rest breaks. Pt appears more fatigued this date, requires more encouragement for participation throughout session as well as increased rest breaks. Pt left seated in w/c in room with needs in reach, quick release belt and chair alarm in place, telesitter present.  Therapy Documentation Precautions:  Precautions Precautions: Fall Precaution Comments: High risk for falls/telesitter Restrictions Weight Bearing Restrictions: No Pain: Pain Assessment Pain Scale: 0-10 Pain Score: 5  Pain Type: Chronic pain Pain Location: Shoulder Pain Orientation: Left Pain Descriptors / Indicators: Aching Pain Onset: On-going Patients Stated Pain Goal:  0 Pain Intervention(s): Heat applied    Therapy/Group: Individual Therapy   Peter Congo, PT, DPT  04/17/2019, 12:31 PM

## 2019-04-17 NOTE — Progress Notes (Signed)
Speech Language Pathology Daily Session Note  Patient Details  Name: Benjamin Cohen MRN: [id number] Date of Birth: 1956/02/03  Today's Date: 04/17/2019 SLP Individual Time: 1215-1330 SLP Individual Time Calculation (min): 75 min  Short Term Goals: Week 1: SLP Short Term Goal 1 (Week 1): Patient will demosntrate sustained attention to functional tasks for ~8 minutes with Mod A verbal cues for redirction.  SLP Short Term Goal 2 (Week 1): Patient will demonstrate functional problem solving for basic and familiar tasks with Mod A verbal cues.  SLP Short Term Goal 3 (Week 1): Patient will utilize external aids to maximize recall of functional information with Mod A multmodal cues.  SLP Short Term Goal 4 (Week 1): Patient will identify 2 physical and 2 cognitive deficits with Mod A multimodal cues.  SLP Short Term Goal 5 (Week 1): Patient will consume current diet with minimal overt s/s of aspiration with Min verbal cus for use of swallowing strategies.  SLP Short Term Goal 6 (Week 1): Patient will consume trials of ice chips/thin liquids over 3 sessions with minimal overt s/s of aspiration and Min A verbal cues to assess readiness for repeat MBS.   Skilled Therapeutic Interventions:  Skilled treatment session focused on dysphagia and cognition goals. SLP facilitated session by providing skilled observation of pt consuming dysphagia 2 lunch tray with nectar thick liquids. Pt with intermittent throat clear throughout consumption but also was attempting to talk throughout consumption. Per chart review, pt has demonstrated improved ability at bedside with thin liquids. As a result, MBS scheduled for 04/18/19 assess safety of liquid advancement. Pt perseverative on taking lap belt alarm off as he "didn't like it." Pt admits to attempts to get up by himself but lacks overall safety awareness.  SLP further facilitated session by providing Max A cues to recall activities within previous therapy session during  today. However pt took phone call while SLP in room and pt able to give clear directions to listener regarding location of errand listener needed to locate. Pt also able to recall discharge date and plan at discharge. Towards end of session, pt struggled with restlessness d/t length of session. Pt left upright in wheelchair, lap belt alarm on and all needs within reach. Continue per current plan of care.      Pain    Therapy/Group: Individual Therapy  Dayla Gasca 04/17/2019, 2:12 PM

## 2019-04-17 NOTE — Progress Notes (Signed)
Occupational Therapy Session Note  Patient Details  Name: Benjamin Cohen MRN: [id number] Date of Birth: 12/31/1955  Today's Date: 04/17/2019 OT Individual Time: 0830-0920 OT Individual Time Calculation (min): 50 min  and Today's Date: 04/17/2019 OT Missed Time: 10 Minutes Missed Time Reason: Patient fatigue   Short Term Goals: Week 1:  OT Short Term Goal 1 (Week 1): Pt will participate in functional task for 7 min prior to needing rest break to improve endurance OT Short Term Goal 2 (Week 1): Pt will sit to stand wiht CGA OT Short Term Goal 3 (Week 1): Pt will reach back prior to sitting to demo improved safety awareness wiht no VC OT Short Term Goal 4 (Week 1): Pt will stand for 1 grooming items for improved endurance OT Short Term Goal 5 (Week 1): Pt will don shirt wiht min A and min VC for compensatory strategies PRN  Skilled Therapeutic Interventions/Progress Updates:    Pt seen for OT ADL bathing/dressing session. Pt sitting EOB upon arrival, eating breakfast and agreeable to tx session. Pt denied pain or fatigue during session, however, when sitting EOB eating would rest head on table btwn bites.  He ambulated throughout room with CGA, reaching to furntiure for support when walking. HE bathed seated on tub transfer bench, lateral leans for buttock hygiene. Pt demonstrated som mild sequencing deficits when bathing, however, when cued pt would state "no this is what I want to do". Following rest break, he ambulated back out into room and dressed seated in standard chair. UB/LB dressing completed with supervision and   Significantly increased time and rest breaks required. He declined attempting to complete grooming tasks in standing, opting to complete while seated in chair 2/2  Fatigue. Pt began falling asleep during grooming tasks, VCs to maintain alertness and pt cont with complaints of "sleepiness". He reports having received sleeping pill last night and believed it was causing his fatigue  this AM. OT spoke with RN about this at end of session.  Pt returned to supine at end of session with CGA, left in supine with all needs in reach and bed alarm on. Pt already asleep as therapist exited. Missed 10 minutes of skilled tx 2/2 fatigue.   Therapy Documentation Precautions:  Precautions Precautions: Fall Precaution Comments: High risk for falls/telesitter Restrictions Weight Bearing Restrictions: No   Therapy/Group: Individual Therapy  Shephanie Romas L 04/17/2019, 6:51 AM

## 2019-04-17 NOTE — Care Management (Signed)
[facility] Individual Statement of Services  Patient Name:  Benjamin Cohen  Date:  04/17/2019  Welcome to the [facility].  Our goal is to provide you with an individualized program based on your diagnosis and situation, designed to meet your specific needs.  With this comprehensive rehabilitation program, you will be expected to participate in at least 3 hours of rehabilitation therapies Monday-Friday, with modified therapy programming on the weekends.  Your rehabilitation program will include the following services:  Physical Therapy (PT), Occupational Therapy (OT), Speech Therapy (ST), 24 hour per day rehabilitation nursing, Therapeutic Recreaction (TR), Neuropsychology, Case Management (Social Worker), Rehabilitation Medicine, Nutrition Services and Pharmacy Services  Weekly team conferences will be held on Tuesdays to discuss your progress.  Your Social Worker will talk with you frequently to get your input and to update you on team discussions.  Team conferences with you and your family in attendance may also be held.  Expected length of stay: 16-20 days   verall anticipated outcome: supervision  Depending on your progress and recovery, your program may change. Your Social Worker will coordinate services and will keep you informed of any changes. Your Social Worker's name and contact numbers are listed  below.  The following services may also be recommended but are not provided by the [facility]:   Driving Evaluations  Home Health Rehabiltiation Services  Outpatient Rehabilitation Services  Vocational Rehabilitation   Arrangements will be made to provide these services after discharge if needed.  Arrangements include referral to agencies that provide these services.  Your insurance has been verified to be:  [employer]; Medicare (A) Your primary doctor is:  [facility]  Pertinent information will be shared with your doctor and your  insurance company.  Social Worker:  [place], [place] [phone] or ([phone]   Information discussed with and copy given to patient by: Amada Jupiter, 04/14/2019, 2:04 PM

## 2019-04-17 NOTE — Progress Notes (Signed)
Benjamin Cohen PHYSICAL MEDICINE & REHABILITATION PROGRESS NOTE  Subjective/Complaints: Up at EOB. Upset that breakfast is cold. Is eating on own without any problems. Otherwise doing well  ROS: Patient denies fever, rash, sore throat, blurred vision, nausea, vomiting, diarrhea, cough, shortness of breath or chest pain, joint or back pain, headache, or mood change.   Objective: Vital Signs: Blood pressure (!) 127/96, pulse 93, temperature 98.3 F (36.8 C), temperature source Oral, resp. rate 17, height 5\' 7"  (1.702 m), weight 79 kg, SpO2 100 %. No results found. No results for input(s): WBC, HGB, HCT, PLT in the last 72 hours. No results for input(s): NA, K, CL, CO2, GLUCOSE, BUN, CREATININE, CALCIUM in the last 72 hours.  Physical Exam: BP (!) 127/96 (BP Location: Right Arm)   Pulse 93   Temp 98.3 F (36.8 C) (Oral)   Resp 17   Ht 5\' 7"  (1.702 m)   Wt 79 kg   SpO2 100%   BMI 27.28 kg/m  Constitutional: No distress . Vital signs reviewed. HEENT: EOMI, oral membranes moist Neck: supple Cardiovascular: RRR without murmur. No JVD    Respiratory: CTA Bilaterally without wheezes or rales. Normal effort    GI: BS +, non-tender, non-distended  Musc:  Improved Left shoulder Neurological: Alert and oriented to person, month, place, reason Motor: 4/5. Fair coordination Psychiatric: flat.more engaging though,  Skin: left heel dressed.   Assessment/Plan: 1. Functional deficits secondary to debility which require 3+ hours per day of interdisciplinary therapy in a comprehensive inpatient rehab setting.  Physiatrist is providing close team supervision and 24 hour management of active medical problems listed below.  Physiatrist and rehab team continue to assess barriers to discharge/monitor patient progress toward functional and medical goals  Care Tool:  Bathing  Bathing activity did not occur: Refused Body parts bathed by patient: Face, Front perineal area, Buttocks, Right upper leg,  Left upper leg, Right arm, Left arm, Right lower leg, Chest, Abdomen, Left lower leg         Bathing assist Assist Level: Supervision/Verbal cueing     Upper Body Dressing/Undressing Upper body dressing   What is the patient wearing?: Pull over shirt    Upper body assist Assist Level: Contact Guard/Touching assist    Lower Body Dressing/Undressing Lower body dressing      What is the patient wearing?: Pants, Incontinence brief     Lower body assist Assist for lower body dressing: Supervision/Verbal cueing     Toileting Toileting    Toileting assist Assist for toileting: Contact Guard/Touching assist     Transfers Chair/bed transfer  Transfers assist     Chair/bed transfer assist level: Contact Guard/Touching assist     Locomotion Ambulation   Ambulation assist      Assist level: Minimal Assistance - Patient > 75% Assistive device: No Device Max distance: 300'   Walk 10 feet activity   Assist     Assist level: Minimal Assistance - Patient > 75% Assistive device: No Device   Walk 50 feet activity   Assist Walk 50 feet with 2 turns activity did not occur: Safety/medical concerns  Assist level: Minimal Assistance - Patient > 75% Assistive device: No Device    Walk 150 feet activity   Assist Walk 150 feet activity did not occur: Safety/medical concerns  Assist level: Minimal Assistance - Patient > 75% Assistive device: No Device    Walk 10 feet on uneven surface  activity   Assist Walk 10 feet on uneven surfaces activity did  not occur: Safety/medical concerns         Wheelchair     Assist Will patient use wheelchair at discharge?: No Type of Wheelchair: Manual    Wheelchair assist level: Supervision/Verbal cueing Max wheelchair distance: 100'    Wheelchair 50 feet with 2 turns activity    Assist        Assist Level: Supervision/Verbal cueing   Wheelchair 150 feet activity     Assist Wheelchair 150 feet  activity did not occur: Safety/medical concerns          Medical Problem List and Plan: 1.Functional deficits and impaired mobilitysecondary to debility/encephalopathy after COVID 19 infection and subsequent respiratory failure/ARDS  -Continue CIR therapies including PT, OT, and SLP   -continue contact precautions for MRSA  -making gains with stamina 2. Antithrombotics: -DVT/anticoagulation:Pharmaceutical:Lovenox -antiplatelet therapy: Plavix 3. Pain Management:tylenol prn  --AC jt arthritis Left shoulder, mild RTC  -voltaren gel  -ice prn  -symptoms have improved 4. Mood:LCSW to follow for evaluation when appropriate. -antipsychotic agents: increased seroquel qhs to 50mg  for better sleep  -Seroquelprn 5. Neuropsych: This patientis notcapable of making decisions on hisown behalf.    -improving insight and awareness 6. Skin/Wound Care:routine pressure relief measures 7. Fluids/Electrolytes/Nutrition:Monitor I/O.   Encourage PO  -appears to be eating well, good appetite 8. Metabolic encephalopathy/Delirium:  See #4 -improving 9. WUJ:[id number]:Monitor BP bid --continue metoprolol for BP/HR control.  Remains elevated--increase metoprolol to 50mg  bid 10. H/o CVA: OnPlavix. 11. T2DM:Hgb A1c- 12.4 --has history of non-compliance per records.  -on amaryl at home--resumedat 1mg  daily  5/4  Sugars appear to be under reasonable control 12. Dysphagia:On dysphagia 2, nectar liquids.     -intermittent sup   LOS: 6 days A FACE TO FACE EVALUATION WAS PERFORMED  Ranelle OysterZachary T Elray Dains 04/17/2019, 9:59 AM

## 2019-04-17 NOTE — Progress Notes (Signed)
Social Work Patient ID: Benjamin Cohen, male   DOB: 30-Apr-1956, 63 y.o.   MRN: [id number]  Have reviewed team conference with pt and his ex-wife with both aware and agreeable with targeted d/c date of 5/16 and supervision goals overall.  Ex-wife notes that she will travel here the day prior to his discharge and plan to take him to her home in [place], [place] on day of d/c.  Discussed need for MD availability for [facility] referrals in that area, however, need to discuss further.  Continue to follow.  Tria Noguera, LCSW

## 2019-04-18 ENCOUNTER — Inpatient Hospital Stay (HOSPITAL_COMMUNITY): Payer: 59 | Admitting: Speech Pathology

## 2019-04-18 ENCOUNTER — Inpatient Hospital Stay (HOSPITAL_COMMUNITY): Payer: 59 | Admitting: Occupational Therapy

## 2019-04-18 ENCOUNTER — Encounter (HOSPITAL_COMMUNITY): Payer: 59 | Admitting: Speech Pathology

## 2019-04-18 ENCOUNTER — Inpatient Hospital Stay (HOSPITAL_COMMUNITY): Payer: 59

## 2019-04-18 ENCOUNTER — Inpatient Hospital Stay (HOSPITAL_COMMUNITY): Payer: 59 | Admitting: Physical Therapy

## 2019-04-18 LAB — GLUCOSE, CAPILLARY
Glucose-Capillary: 125 mg/dL — ABNORMAL HIGH (ref 70–99)
Glucose-Capillary: 156 mg/dL — ABNORMAL HIGH (ref 70–99)
Glucose-Capillary: 105 mg/dL — ABNORMAL HIGH (ref 70–99)

## 2019-04-18 NOTE — Progress Notes (Signed)
[facility] PHYSICAL MEDICINE & REHABILITATION PROGRESS NOTE  Subjective/Complaints: No new complaints. Had a good night. Pain seems controlled.   ROS: Patient denies fever, rash, sore throat, blurred vision, nausea, vomiting, diarrhea, cough,  chest pain, joint or back pain, headache, or mood change.   Objective: Vital Signs: Blood pressure 125/67, pulse 78, temperature 97.9 F (36.6 C), temperature source Oral, resp. rate 17, height 5\' 7"  (1.702 m), weight 79 kg, SpO2 99 %. Dg Swallowing Func-speech Pathology  Result Date: 04/18/2019 Objective Swallowing Evaluation: Type of Study: MBS-Modified Barium Swallow Study  Patient Details Name: Geri Seminoledward W Mccartt MRN: [id number] Date of Birth: March 17, 1956 Today's Date: 04/18/2019 Time: SLP Start Time (ACUTE ONLY): 1415 -SLP Stop Time (ACUTE ONLY): 1500 SLP Time Calculation (min) (ACUTE ONLY): 45 min Past Medical History: Past Medical History: Diagnosis Date . Diabetes mellitus without complication (HCC)  . Hypertension  . Stroke [facility](HCC)  Past Surgical History: Past Surgical History: Procedure Laterality Date . NO PAST SURGERIES   HPI: 63 y/o M admitted 3/31 COVID (+).Developed worsening hypoxemia, respiratory distress 4/1 and PCCM consulted for evaluation. Intubated 4/2 with ARDS.Was extubated 03/23/2019, with no oxygen requirement. Initial clinical swallow evaluation 4/13 revealed concerns for severe dysphagia; NPO was recommended, TF.  Pt developed respiratory distress and was reintubated on 03/28/2019; extubated 4/20. Hx prior strokes.  Subjective: alert Assessment / Plan / Recommendation CHL IP CLINICAL IMPRESSIONS 04/18/2019 Clinical Impression Pt presents with mild oropharyngeal dysphagia c/b mild difficulty with bolus cohesion (especially when consuming whole barium tablet with thin liquids, applesauce aided in orally clearing pill). Despite this, pt able to effectively clear oral cavity with dysphagia 3 textures. Additionally, pt presents with swallow initiaiton  at pyriform sinuses when consuming thin liquids. No penetration or aspiration were observed even with challenging boluses of thin liquids. Recommend dysphagia 3 with thin liquids,  medicine whole in puree and intermittent supervision.  SLP Visit Diagnosis Dysphagia, oropharyngeal phase (R13.12) Attention and concentration deficit following -- Frontal lobe and executive function deficit following -- Impact on safety and function Mild aspiration risk   CHL IP TREATMENT RECOMMENDATION 04/09/2019 Treatment Recommendations Therapy as outlined in treatment plan below   Prognosis 04/09/2019 Prognosis for Safe Diet Advancement Good Barriers to Reach Goals Cognitive deficits;Time post onset;Severity of deficits Barriers/Prognosis Comment -- CHL IP DIET RECOMMENDATION 04/18/2019 SLP Diet Recommendations Dysphagia 3 (Mech soft) solids;Thin liquid Liquid Administration via Cup;Straw Medication Administration Whole meds with puree Compensations Minimize environmental distractions;Slow rate;Small sips/bites Postural Changes --   CHL IP OTHER RECOMMENDATIONS 04/09/2019 Recommended Consults -- Oral Care Recommendations Oral care BID Other Recommendations Order thickener from pharmacy;Prohibited food (jello, ice cream, thin soups);Remove water pitcher   CHL IP FOLLOW UP RECOMMENDATIONS 04/10/2019 Follow up Recommendations Inpatient Rehab   CHL IP FREQUENCY AND DURATION 04/09/2019 Speech Therapy Frequency (ACUTE ONLY) min 2x/week Treatment Duration 2 weeks      CHL IP ORAL PHASE 04/18/2019 Oral Phase Impaired Oral - Pudding Teaspoon -- Oral - Pudding Cup -- Oral - Honey Teaspoon -- Oral - Honey Cup -- Oral - Nectar Teaspoon NT Oral - Nectar Cup NT Oral - Nectar Straw NT Oral - Thin Teaspoon -- Oral - Thin Cup Premature spillage Oral - Thin Straw Premature spillage Oral - Puree NT Oral - Mech Soft Premature spillage Oral - Regular -- Oral - Multi-Consistency -- Oral - Pill Reduced posterior propulsion;Weak lingual manipulation;Delayed oral  transit;Decreased bolus cohesion Oral Phase - Comment --  CHL IP PHARYNGEAL PHASE 04/18/2019 Pharyngeal Phase Impaired Pharyngeal- Pudding Teaspoon --  Pharyngeal -- Pharyngeal- Pudding Cup -- Pharyngeal -- Pharyngeal- Honey Teaspoon -- Pharyngeal -- Pharyngeal- Honey Cup -- Pharyngeal -- Pharyngeal- Nectar Teaspoon NT Pharyngeal -- Pharyngeal- Nectar Cup NT Pharyngeal -- Pharyngeal- Nectar Straw NT Pharyngeal -- Pharyngeal- Thin Teaspoon Delayed swallow initiation-pyriform sinuses Pharyngeal -- Pharyngeal- Thin Cup Delayed swallow initiation-pyriform sinuses Pharyngeal -- Pharyngeal- Thin Straw Delayed swallow initiation-pyriform sinuses Pharyngeal -- Pharyngeal- Puree Delayed swallow initiation-vallecula Pharyngeal -- Pharyngeal- Mechanical Soft Delayed swallow initiation-vallecula Pharyngeal -- Pharyngeal- Regular -- Pharyngeal -- Pharyngeal- Multi-consistency -- Pharyngeal -- Pharyngeal- Pill Delayed swallow initiation-vallecula Pharyngeal -- Pharyngeal Comment --  CHL IP CERVICAL ESOPHAGEAL PHASE 04/18/2019 Cervical Esophageal Phase WFL Pudding Teaspoon -- Pudding Cup -- Honey Teaspoon -- Honey Cup -- Nectar Teaspoon -- Nectar Cup -- Nectar Straw -- Thin Teaspoon -- Thin Cup -- Thin Straw -- Puree -- Mechanical Soft -- Regular -- Multi-consistency -- Pill -- Cervical Esophageal Comment -- [name] 04/18/2019, 9:49 AM              No results for input(s): WBC, HGB, HCT, PLT in the last 72 hours. No results for input(s): NA, K, CL, CO2, GLUCOSE, BUN, CREATININE, CALCIUM in the last 72 hours.  Physical Exam: BP 125/67 (BP Location: Right Arm)   Pulse 78   Temp 97.9 F (36.6 C) (Oral)   Resp 17   Ht 5\' 7"  (1.702 m)   Wt 79 kg   SpO2 99%   BMI 27.28 kg/m  Constitutional: No distress . Vital signs reviewed. HEENT: EOMI, oral membranes moist Neck: supple Cardiovascular: RRR without murmur. No JVD    Respiratory: CTA Bilaterally without wheezes or rales. Normal effort    GI: BS +, non-tender,  non-distended  Musc:  Improved Left shoulder Neurological: Alert and oriented to person, month, place, reason Motor: 4/5. Fair coordination Psychiatric: flat.more engaging though,  Skin: left heel with dried blister, minimal drainage.   Assessment/Plan: 1. Functional deficits secondary to debility which require 3+ hours per day of interdisciplinary therapy in a comprehensive inpatient rehab setting.  Physiatrist is providing close team supervision and 24 hour management of active medical problems listed below.  Physiatrist and rehab team continue to assess barriers to discharge/monitor patient progress toward functional and medical goals  Care Tool:  Bathing  Bathing activity did not occur: Refused Body parts bathed by patient: Face, Front perineal area, Buttocks, Right upper leg, Left upper leg, Right arm, Left arm, Right lower leg, Chest, Abdomen, Left lower leg         Bathing assist Assist Level: Supervision/Verbal cueing     Upper Body Dressing/Undressing Upper body dressing   What is the patient wearing?: Pull over shirt    Upper body assist Assist Level: Contact Guard/Touching assist    Lower Body Dressing/Undressing Lower body dressing      What is the patient wearing?: Pants, Incontinence brief     Lower body assist Assist for lower body dressing: Supervision/Verbal cueing     Toileting Toileting    Toileting assist Assist for toileting: Contact Guard/Touching assist     Transfers Chair/bed transfer  Transfers assist     Chair/bed transfer assist level: Contact Guard/Touching assist     Locomotion Ambulation   Ambulation assist      Assist level: Minimal Assistance - Patient > 75% Assistive device: No Device Max distance: 150'   Walk 10 feet activity   Assist     Assist level: Minimal Assistance - Patient > 75% Assistive device: No Device   Walk  50 feet activity   Assist Walk 50 feet with 2 turns activity did not occur:  Safety/medical concerns  Assist level: Minimal Assistance - Patient > 75% Assistive device: No Device    Walk 150 feet activity   Assist Walk 150 feet activity did not occur: Safety/medical concerns  Assist level: Minimal Assistance - Patient > 75% Assistive device: No Device    Walk 10 feet on uneven surface  activity   Assist Walk 10 feet on uneven surfaces activity did not occur: Safety/medical concerns         Wheelchair     Assist Will patient use wheelchair at discharge?: No Type of Wheelchair: Manual    Wheelchair assist level: Supervision/Verbal cueing Max wheelchair distance: 100'    Wheelchair 50 feet with 2 turns activity    Assist        Assist Level: Supervision/Verbal cueing   Wheelchair 150 feet activity     Assist Wheelchair 150 feet activity did not occur: Safety/medical concerns          Medical Problem List and Plan: 1.Functional deficits and impaired mobilitysecondary to debility/encephalopathy after COVID 19 infection and subsequent respiratory failure/ARDS  --Continue CIR therapies including PT, OT, and SLP   -continue contact precautions for MRSA  -making gains with stamina 2. Antithrombotics: -DVT/anticoagulation:Pharmaceutical:Lovenox -antiplatelet therapy: Plavix 3. Pain Management:tylenol prn  --AC jt arthritis Left shoulder, mild RTC  -voltaren gel  -ice prn  -symptoms have improved 4. Mood:LCSW to follow for evaluation when appropriate. -antipsychotic agents: increased seroquel qhs to  for better sleep  -Seroquelprn 5. Neuropsych: This patientis notcapable of making decisions on hisown behalf.    -improving insight and awareness 6. Skin/Wound Care:may use foam dressing only left heel  -pressure relief 7. Fluids/Electrolytes/Nutrition:Monitor I/O.   Encourage PO  -appears to be eating well, good appetite 8. Metabolic encephalopathy/Delirium:  See  #4 -improving 9. [id number] BP bid --continue metoprolol for BP/HR control.  bp improved with metoprolol  bid 10. H/o CVA: OnPlavix. 11. T2DM:Hgb A1c- 12.4 --has history of non-compliance per records.  -on amaryl at home--resumed at  daily  5/4  Sugars under good control 12. Dysphagia:On dysphagia 3 thins.         LOS: 7 days A FACE TO FACE EVALUATION WAS PERFORMED  Ranelle Oyster 04/18/2019, 10:14 AM

## 2019-04-18 NOTE — Progress Notes (Signed)
Speech Language Pathology Weekly Progress and Session Note  Patient Details  Name: Benjamin Cohen MRN: [id number] Date of Birth: 05-20-1956  Beginning of progress report period: Apr 12, 2019 End of progress report period: Apr 18, 2019  Today's Date: 04/18/2019   Skilled treatment session #1 SLP Individual Time: 0950-1000 SLP Individual Time Calculation (min): 10 min   Skilled treatment session #2 SLP Individual Time Calculation (min): 30 min  Short Term Goals: Week 1: SLP Short Term Goal 1 (Week 1): Patient will demosntrate sustained attention to functional tasks for ~8 minutes with Mod A verbal cues for redirction.  SLP Short Term Goal 1 - Progress (Week 1): Met SLP Short Term Goal 2 (Week 1): Patient will demonstrate functional problem solving for basic and familiar tasks with Mod A verbal cues.  SLP Short Term Goal 2 - Progress (Week 1): Met SLP Short Term Goal 3 (Week 1): Patient will utilize external aids to maximize recall of functional information with Mod A multmodal cues.  SLP Short Term Goal 3 - Progress (Week 1): Met SLP Short Term Goal 4 (Week 1): Patient will identify 2 physical and 2 cognitive deficits with Mod A multimodal cues.  SLP Short Term Goal 4 - Progress (Week 1): Progressing toward goal SLP Short Term Goal 5 (Week 1): Patient will consume current diet with minimal overt s/s of aspiration with Min verbal cus for use of swallowing strategies.  SLP Short Term Goal 5 - Progress (Week 1): Met SLP Short Term Goal 6 (Week 1): Patient will consume trials of ice chips/thin liquids over 3 sessions with minimal overt s/s of aspiration and Min A verbal cues to assess readiness for repeat MBS.  SLP Short Term Goal 6 - Progress (Week 1): Met    New Short Term Goals: Week 2: SLP Short Term Goal 1 (Week 2): Patient will demosntrate selective attention to functional tasks for ~15 minutes with Min A verbal cues for redirction.  SLP Short Term Goal 2 (Week 2): Patient will  demonstrate functional problem solving for basic and familiar tasks with Min A verbal cues.  SLP Short Term Goal 3 (Week 2): Patient will utilize external aids to maximize recall of functional information with Min A multmodal cues.  SLP Short Term Goal 4 (Week 2): Patient will identify 2 physical and 2 cognitive deficits with Mod A multimodal cues.  SLP Short Term Goal 5 (Week 2): Patient will consume current diet with minimal overt s/s of aspiration with Mod I  for use of swallowing strategies.   Weekly Progress Updates:  Pt has made good progress this reporting period and as a result, pt has met 5 out of 6 STGs. Pt recently completed MBS and consumed trials of regular diet textures. Pt is currently consuming regular diet with thin liquids with no overt s/s of aspiration or dysphagia. Additionally, pt has made improvements with cognitive function but continues to require support for problem solving, recall, selective attention, insight, and executive functions. Skilled ST is required to target these deficits in order to increase pt's functional independence and decrease caregiver burden. Anticipate that pt will require 24 hour supervision with HHST follow up.      Intensity: Minumum of 1-2 x/day, 30 to 90 minutes Frequency: 3 to 5 out of 7 days Duration/Length of Stay: 5/16 Treatment/Interventions: Cognitive remediation/compensation;Environmental controls;Internal/external aids;Speech/Language facilitation;Therapeutic Activities;Patient/family education;Functional tasks;Dysphagia/aspiration precaution training;Cueing hierarchy   Daily Session  Skilled Therapeutic Interventions:   Skilled treatment session #1 focused on providing education on results  and recommendations of MBS as well as POC to include observation during lunch of advanced textures with thin liquids. All of pt's questions answered.    Skilled treatment session #2 focused on dysphagia and cognition goals. SLP received pt in bed  with lunch tray finished. Despite note on door and nursing being aware, pt was given lunch tray of advanced textures by dietary staff. When nursing noticed, they provided intermittent supervision and didn't report any overt s/s of aspiration while consuming. IN this session, SLP provided skilled observation of pt consuming thin liquids, via cup, ice cream and peanut butter crackers. Pt was functional when consuming all items and diet upgraded to regular with thin liquids, medicine whole in puree. SLP further facilitiated session by providing Mod A cues to recall areas of deficits being targeted among therapies. Pt left supine in bed, bed alarm on and all needs within reach, telesitter present. Education provided to nursing as well as posted in pt's room.    General    Pain    Therapy/Group: Individual Therapy  Cynda Soule 04/18/2019, 4:52 PM

## 2019-04-18 NOTE — Progress Notes (Signed)
Modified Barium Swallow Progress Note  Patient Details  Name: Benjamin Cohen MRN: [id number] Date of Birth: 1956-08-15  Today's Date: 04/18/2019  Modified Barium Swallow completed.  Full report located under Chart Review in the Imaging Section.  Brief recommendations include the following:  Clinical Impression  Pt presents with mild oropharyngeal dysphagia c/b mild difficulty with bolus cohesion (especially when consuming whole barium tablet with thin liquids, applesauce aided in orally clearing pill). Despite this, pt able to effectively clear oral cavity with dysphagia 3 textures. Additionally, pt presents with swallow initiaiton at pyriform sinuses when consuming thin liquids. No penetration or aspiration were observed even with challenging boluses of thin liquids. Recommend dysphagia 3 with thin liquids,  medicine whole in puree and intermittent supervision.    Swallow Evaluation Recommendations       SLP Diet Recommendations: Dysphagia 3 (Mech soft) solids;Thin liquid   Liquid Administration via: Cup;Straw   Medication Administration: Whole meds with puree   Supervision: Patient able to self feed;Intermittent supervision to cue for compensatory strategies   Compensations: Minimize environmental distractions;Slow rate;Small sips/bites                [name] 04/18/2019,9:48 AM

## 2019-04-18 NOTE — Progress Notes (Signed)
Occupational Therapy Weekly Progress Note  Patient Details  Name: Benjamin Cohen MRN: [id number] Date of Birth: July 18, 1956  Beginning of progress report period: Apr 12, 2019 End of progress report period: Apr 18, 2019  Today's Date: 04/18/2019 OT Individual Time: 2694-8546 OT Individual Time Calculation (min): 55 min    Patient has met 3 of 5 short term goals.  Pt is making steady progress towards OT goals. He cont to be most limited by decreased functional activity tolerance, poor safety awareness, and decreased attention to task. He is completing short distance ambulation and functional transfers with close supervision- CGA without AD. Bathing/dressing completed at close supervision-CGA with significantly increased time and rest breaks required throughout. Pt is inconsistent throughout day with activity levels, appears to have more activity tolerance in PM vs. AM. He is demonstrating improved awareness of his deficits and functional implications. Pt on track to meet supervision level goals in prep for d/c home next week.   Patient continues to demonstrate the following deficits: muscle weakness, decreased cardiorespiratoy endurance and decreased oxygen support, decreased attention, decreased awareness, decreased problem solving, decreased safety awareness, decreased memory and delayed processing and decreased standing balance, decreased postural control and decreased balance strategies and therefore will continue to benefit from skilled OT intervention to enhance overall performance with BADL and Reduce care partner burden.  Patient progressing toward long term goals..  Continue plan of care.  OT Short Term Goals Week 1:  OT Short Term Goal 1 (Week 1): Pt will participate in functional task for 7 min prior to needing rest break to improve endurance OT Short Term Goal 1 - Progress (Week 1): Progressing toward goal OT Short Term Goal 2 (Week 1): Pt will sit to stand with CGA OT Short Term Goal 2 -  Progress (Week 1): Met OT Short Term Goal 3 (Week 1): Pt will reach back prior to sitting to demo improved safety awareness wiht no VC OT Short Term Goal 3 - Progress (Week 1): Not met OT Short Term Goal 4 (Week 1): Pt will stand for 1 grooming items for improved endurance OT Short Term Goal 4 - Progress (Week 1): Met OT Short Term Goal 5 (Week 1): Pt will don shirt wiht min A and min VC for compensatory strategies PRN OT Short Term Goal 5 - Progress (Week 1): Met Week 2:  OT Short Term Goal 1 (Week 2): STG=LTG due to LOS  Skilled Therapeutic Interventions/Progress Updates:    Pt seen for OT session focusing on functional activity tolerance during ADLs and mobility. Pt sitting EOB upon arrival, agreeable to tx session. Pt denying pain at rest, however, with mobility and prolonged standing pt voicing back discomfort and L shoulder/arm pain. RN made aware during session and applied muscle cream. Rest breaks also provided throughout due to fatigue.  He ambulated throughout room and completed oral care standing at sink, heavy reliance on UEs due to fatigue. He tolerated standing ~3 minutes in standing before impulsive and unsafe movement to sit in chair to rest.  He ambulated to therapy gym pushing w/c, completed with close supervision and one standing rest break.  Completed zoom ball activity initially in sitting progressing to standing addressing functional activity tolerance with supervision. Pt with poor attention to task, easily distracted by internal and external factors.  Completed ball toss activity having pt name food items and then football teams with each toss, maintaining attention to task ~2 minutes before beginning with various complaints of discomfort and restlessness. Attempted to  position pt in prone or side-lying, however, due to restlessness unable to maintain therapeutic position. Provided PROM to L shoulder. Pt tolerating only ~90 degrees flexion until pain limiting mobility. Pt  returned to room in same manner as described above. Left seated in w/c with NT to leave him set-up.   Therapy Documentation Precautions:  Precautions Precautions: Fall Precaution Comments: High risk for falls/telesitter Restrictions Weight Bearing Restrictions: No   Therapy/Group: Individual Therapy  Colton Tassin L 04/18/2019, 7:11 AM

## 2019-04-19 ENCOUNTER — Inpatient Hospital Stay (HOSPITAL_COMMUNITY): Payer: 59 | Admitting: Occupational Therapy

## 2019-04-19 LAB — GLUCOSE, CAPILLARY
Glucose-Capillary: 122 mg/dL — ABNORMAL HIGH (ref 70–99)
Glucose-Capillary: 128 mg/dL — ABNORMAL HIGH (ref 70–99)
Glucose-Capillary: 125 mg/dL — ABNORMAL HIGH (ref 70–99)
Glucose-Capillary: 138 mg/dL — ABNORMAL HIGH (ref 70–99)
Glucose-Capillary: 142 mg/dL — ABNORMAL HIGH (ref 70–99)

## 2019-04-19 NOTE — Progress Notes (Signed)
Occupational Therapy Session Note  Patient Details  Name: Benjamin Cohen MRN: [id number] Date of Birth: Jul 28, 1956  Today's Date: 04/19/2019 OT Individual Time: 0820-0900 OT Individual Time Calculation (min): 40 min    Short Term Goals: Week 2:  OT Short Term Goal 1 (Week 2): STG=LTG due to LOS  Skilled Therapeutic Interventions/Progress Updates:    Treatment session with focus on activity tolerance and endurance during self-care and therapeutic activities. Pt asleep in bed upon arrival, requiring increased time to fully arouse. Pt declined bathing/dressing but expressed desire to complete grooming tasks at sink.  Pt ambulated to sink with supervision and washed face and brushed teeth.  Ambulated to Dayroom with CGA.  Pt stopping one time and bending over impulsively, therapist providing min assist for balance.  Pt then stood back up and continued to destination.  Engaged in ambulation while kicking ball to challenge endurance and balance reactions during quick and slow ambulation as pt may encounter in the community.  Pt required seated rest break after each bout.  O2 sats WNL throughout session.  Returned to room while tossing ball to self to challenge dual task while therapist also engaged pt in football conversation.  Pt remained upright in w/c with seat belt alarm on and all needs in reach.  Therapy Documentation Precautions:  Precautions Precautions: Fall Precaution Comments: High risk for falls/telesitter Restrictions Weight Bearing Restrictions: No Pain: Pain Assessment Pain Scale: 0-10 Pain Score: 0-No pain    Therapy/Group: Individual Therapy  Rosalio Loud 04/19/2019, 12:04 PM

## 2019-04-19 NOTE — Progress Notes (Signed)
Physical Therapy Session Note  Patient Details  Name: Benjamin Cohen MRN: [id number] Date of Birth: 12-09-1956  Today's Date: 04/18/2019 PT Individual Time: 1630-1725   55 min   Short Term Goals: Week 1:  PT Short Term Goal 1 (Week 1): Patient to tolerate 10 minutes continuous activite prior to needing rest. PT Short Term Goal 2 (Week 1): Patient to perform transfers with CGA demonstrating safe technique 50% of the time. PT Short Term Goal 3 (Week 1): Patient to ambulate with LRAD and CGA x 80'.  PT Short Term Goal 4 (Week 1): Patient to negotiate 4 (6") steps with min A with rails.   Skilled Therapeutic Interventions/Progress Updates:     Pt received sitting in WC and agreeable to PT. Treatment session focused on overall endurance, balance, and safety with ambulation. Gait training without AD 2 x 275f + 544fwith CGA - supervision assist and mutiple standing rest breaks. Dynamic balance training along with problem solving instructed by PT with CGA-min assist while engaged in Wii bowling. Min assist to prevent LOB x 2 to the L. And multiple sitting rest breaks due to fatigue. Patient returned to room and [facility] reach and all needs met.     Therapy Documentation Precautions:  Precautions Precautions: Fall Precaution Comments: High risk for falls/telesitter Restrictions Weight Bearing Restrictions: No    Vital Signs: Therapy Vitals Temp: 98.1 F (36.7 C) Temp Source: Oral Pulse Rate: 83 Resp: 18 BP: (!) 150/86 Patient Position (if appropriate): Lying Oxygen Therapy SpO2: 99 % O2 Device: Room Air Pain: denies    Therapy/Group: Individual Therapy  AuLorie Phenix/08/2019, 7:51 AM

## 2019-04-19 NOTE — Plan of Care (Signed)
  Problem: Consults Goal: RH GENERAL PATIENT EDUCATION Description See Patient Education module for education specifics. Outcome: Progressing   Problem: RH SKIN INTEGRITY Goal: RH STG SKIN FREE OF INFECTION/BREAKDOWN Description No new breakdown with min assist   Outcome: Progressing   Problem: RH SAFETY Goal: RH STG ADHERE TO SAFETY PRECAUTIONS W/ASSISTANCE/DEVICE Description STG Adhere to Safety Precautions With Mod Assistance/Device.  Outcome: Progressing Goal: RH STG DECREASED RISK OF FALL WITH ASSISTANCE Description STG Decreased Risk of Fall With [employer].  Outcome: Progressing   Problem: RH PAIN MANAGEMENT Goal: RH STG PAIN MANAGED AT OR BELOW PT'S PAIN GOAL Description < 2 out of 10.   Outcome: Progressing   Problem: RH KNOWLEDGE DEFICIT GENERAL Goal: RH STG INCREASE KNOWLEDGE OF SELF CARE AFTER HOSPITALIZATION Description Pt will be able to verbalize understanding of self care measures post discharge with min assist/cues at discharge.   Outcome: Progressing

## 2019-04-19 NOTE — Progress Notes (Signed)
[place] PHYSICAL MEDICINE & REHABILITATION PROGRESS NOTE  Subjective/Complaints: Pt up in bed. No new issues this morning.   ROS: Patient denies fever, rash, sore throat, blurred vision, nausea, vomiting, diarrhea, cough, shortness of breath or chest pain, joint or back pain, headache, or mood change.   Objective: Vital Signs: Blood pressure (!) 150/86, pulse 83, temperature 98.1 F (36.7 C), temperature source Oral, resp. rate 18, height 5\' 7"  (1.702 m), weight 79 kg, SpO2 99 %. Dg Swallowing Func-speech Pathology  Result Date: 04/18/2019 Objective Swallowing Evaluation: Type of Study: MBS-Modified Barium Swallow Study  Patient Details Name: Benjamin Cohen MRN: [id number] Date of Birth: 04/03/1956 Today's Date: 04/18/2019 Time: SLP Start Time (ACUTE ONLY): 1415 -SLP Stop Time (ACUTE ONLY): 1500 SLP Time Calculation (min) (ACUTE ONLY): 45 min Past Medical History: Past Medical History: Diagnosis Date . Diabetes mellitus without complication (HCC)  . Hypertension  . Stroke [facility])  Past Surgical History: Past Surgical History: Procedure Laterality Date . NO PAST SURGERIES   HPI: 63 y/o M admitted 3/31 COVID (+).Developed worsening hypoxemia, respiratory distress 4/1 and PCCM consulted for evaluation. Intubated 4/2 with ARDS.Was extubated 03/23/2019, with no oxygen requirement. Initial clinical swallow evaluation 4/13 revealed concerns for severe dysphagia; NPO was recommended, TF.  Pt developed respiratory distress and was reintubated on 03/28/2019; extubated 4/20. Hx prior strokes.  Subjective: alert Assessment / Plan / Recommendation CHL IP CLINICAL IMPRESSIONS 04/18/2019 Clinical Impression Pt presents with mild oropharyngeal dysphagia c/b mild difficulty with bolus cohesion (especially when consuming whole barium tablet with thin liquids, applesauce aided in orally clearing pill). Despite this, pt able to effectively clear oral cavity with dysphagia 3 textures. Additionally, pt presents with swallow  initiaiton at pyriform sinuses when consuming thin liquids. No penetration or aspiration were observed even with challenging boluses of thin liquids. Recommend dysphagia 3 with thin liquids,  medicine whole in puree and intermittent supervision.  SLP Visit Diagnosis Dysphagia, oropharyngeal phase (R13.12) Attention and concentration deficit following -- Frontal lobe and executive function deficit following -- Impact on safety and function Mild aspiration risk   CHL IP TREATMENT RECOMMENDATION 04/09/2019 Treatment Recommendations Therapy as outlined in treatment plan below   Prognosis 04/09/2019 Prognosis for Safe Diet Advancement Good Barriers to Reach Goals Cognitive deficits;Time post onset;Severity of deficits Barriers/Prognosis Comment -- CHL IP DIET RECOMMENDATION 04/18/2019 SLP Diet Recommendations Dysphagia 3 (Mech soft) solids;Thin liquid Liquid Administration via Cup;Straw Medication Administration Whole meds with puree Compensations Minimize environmental distractions;Slow rate;Small sips/bites Postural Changes --   CHL IP OTHER RECOMMENDATIONS 04/09/2019 Recommended Consults -- Oral Care Recommendations Oral care BID Other Recommendations Order thickener from pharmacy;Prohibited food (jello, ice cream, thin soups);Remove water pitcher   CHL IP FOLLOW UP RECOMMENDATIONS 04/10/2019 Follow up Recommendations Inpatient Rehab   CHL IP FREQUENCY AND DURATION 04/09/2019 Speech Therapy Frequency (ACUTE ONLY) min 2x/week Treatment Duration 2 weeks      CHL IP ORAL PHASE 04/18/2019 Oral Phase Impaired Oral - Pudding Teaspoon -- Oral - Pudding Cup -- Oral - Honey Teaspoon -- Oral - Honey Cup -- Oral - Nectar Teaspoon NT Oral - Nectar Cup NT Oral - Nectar Straw NT Oral - Thin Teaspoon -- Oral - Thin Cup Premature spillage Oral - Thin Straw Premature spillage Oral - Puree NT Oral - Mech Soft Premature spillage Oral - Regular -- Oral - Multi-Consistency -- Oral - Pill Reduced posterior propulsion;Weak lingual  manipulation;Delayed oral transit;Decreased bolus cohesion Oral Phase - Comment --  CHL IP PHARYNGEAL PHASE 04/18/2019 Pharyngeal Phase Impaired  Pharyngeal- Pudding Teaspoon -- Pharyngeal -- Pharyngeal- Pudding Cup -- Pharyngeal -- Pharyngeal- Honey Teaspoon -- Pharyngeal -- Pharyngeal- Honey Cup -- Pharyngeal -- Pharyngeal- Nectar Teaspoon NT Pharyngeal -- Pharyngeal- Nectar Cup NT Pharyngeal -- Pharyngeal- Nectar Straw NT Pharyngeal -- Pharyngeal- Thin Teaspoon Delayed swallow initiation-pyriform sinuses Pharyngeal -- Pharyngeal- Thin Cup Delayed swallow initiation-pyriform sinuses Pharyngeal -- Pharyngeal- Thin Straw Delayed swallow initiation-pyriform sinuses Pharyngeal -- Pharyngeal- Puree Delayed swallow initiation-vallecula Pharyngeal -- Pharyngeal- Mechanical Soft Delayed swallow initiation-vallecula Pharyngeal -- Pharyngeal- Regular -- Pharyngeal -- Pharyngeal- Multi-consistency -- Pharyngeal -- Pharyngeal- Pill Delayed swallow initiation-vallecula Pharyngeal -- Pharyngeal Comment --  CHL IP CERVICAL ESOPHAGEAL PHASE 04/18/2019 Cervical Esophageal Phase WFL Pudding Teaspoon -- Pudding Cup -- Honey Teaspoon -- Honey Cup -- Nectar Teaspoon -- Nectar Cup -- Nectar Straw -- Thin Teaspoon -- Thin Cup -- Thin Straw -- Puree -- Mechanical Soft -- Regular -- Multi-consistency -- Pill -- Cervical Esophageal Comment -- Benjamin Cohen 04/18/2019, 9:49 AM              No results for input(s): WBC, HGB, HCT, PLT in the last 72 hours. No results for input(s): NA, K, CL, CO2, GLUCOSE, BUN, CREATININE, CALCIUM in the last 72 hours.  Physical Exam: BP (!) 150/86 (BP Location: Right Arm)   Pulse 83   Temp 98.1 F (36.7 C) (Oral)   Resp 18   Ht 5\' 7"  (1.702 m)   Wt 79 kg   SpO2 99%   BMI 27.28 kg/m  Constitutional: No distress . Vital signs reviewed. HEENT: EOMI, oral membranes moist Neck: supple Cardiovascular: RRR without murmur. No JVD    Respiratory: CTA Bilaterally without wheezes or rales. Normal effort     GI: BS +, non-tender, non-distended  Musc:  Improved Left shoulder Neurological: Alert and oriented to person, month, place, reason Motor: 4/5. Fair coordination Psychiatric: flat.more engaging though,  Skin: left heel with dried blister, minimal drainage. Not visualized today  Assessment/Plan: 1. Functional deficits secondary to debility which require 3+ hours per day of interdisciplinary therapy in a comprehensive inpatient rehab setting.  Physiatrist is providing close team supervision and 24 hour management of active medical problems listed below.  Physiatrist and rehab team continue to assess barriers to discharge/monitor patient progress toward functional and medical goals  Care Tool:  Bathing  Bathing activity did not occur: Refused Body parts bathed by patient: Face, Front perineal area, Buttocks, Right upper leg, Left upper leg, Right arm, Left arm, Right lower leg, Chest, Abdomen, Left lower leg         Bathing assist Assist Level: Supervision/Verbal cueing     Upper Body Dressing/Undressing Upper body dressing   What is the patient wearing?: Pull over shirt    Upper body assist Assist Level: Contact Guard/Touching assist    Lower Body Dressing/Undressing Lower body dressing      What is the patient wearing?: Pants, Incontinence brief     Lower body assist Assist for lower body dressing: Supervision/Verbal cueing     Toileting Toileting    Toileting assist Assist for toileting: Contact Guard/Touching assist     Transfers Chair/bed transfer  Transfers assist     Chair/bed transfer assist level: Supervision/Verbal cueing     Locomotion Ambulation   Ambulation assist      Assist level: Minimal Assistance - Patient > 75% Assistive device: No Device Max distance: 150'   Walk 10 feet activity   Assist     Assist level: Minimal Assistance - Patient > 75% Assistive  device: No Device   Walk 50 feet activity   Assist Walk 50 feet  with 2 turns activity did not occur: Safety/medical concerns  Assist level: Minimal Assistance - Patient > 75% Assistive device: No Device    Walk 150 feet activity   Assist Walk 150 feet activity did not occur: Safety/medical concerns  Assist level: Minimal Assistance - Patient > 75% Assistive device: No Device    Walk 10 feet on uneven surface  activity   Assist Walk 10 feet on uneven surfaces activity did not occur: Safety/medical concerns         Wheelchair     Assist Will patient use wheelchair at discharge?: No Type of Wheelchair: Manual    Wheelchair assist level: Supervision/Verbal cueing Max wheelchair distance: 100'    Wheelchair 50 feet with 2 turns activity    Assist        Assist Level: Supervision/Verbal cueing   Wheelchair 150 feet activity     Assist Wheelchair 150 feet activity did not occur: Safety/medical concerns          Medical Problem List and Plan: 1.Functional deficits and impaired mobilitysecondary to debility/encephalopathy after COVID 19 infection and subsequent respiratory failure/ARDS  --Continue CIR therapies including PT, OT, and SLP   -continue contact precautions for MRSA  2. Antithrombotics: -DVT/anticoagulation:Pharmaceutical:Lovenox -antiplatelet therapy: Plavix 3. Pain Management:tylenol prn  --AC jt arthritis Left shoulder, mild RTC  -voltaren gel  -ice prn  -improved 4. Mood:LCSW to follow for evaluation when appropriate. -antipsychotic agents: increased seroquel qhs to  for better sleep  -Seroquelprn 5. Neuropsych: This patientis notcapable of making decisions on hisown behalf.    -improving insight and awareness 6. Skin/Wound Care:may use foam dressing only left heel  -pressure relief 7. Fluids/Electrolytes/Nutrition:Monitor I/O.   Encourage PO  -appears to be eating well, good appetite 8. Metabolic encephalopathy/Delirium:  See  #4 -improving 9. ZOX:WRUEAVW BP bid --continue metoprolol for BP/HR control.  bp improved with metoprolol  bid--no changes 10. H/o CVA: OnPlavix. 11. T2DM:Hgb A1c- 12.4 --has history of non-compliance per records.  -on amaryl at home--resumed at  daily  5/4  Sugars under good control 12. Dysphagia:On dysphagia 3 thins.         LOS: 8 days A FACE TO FACE EVALUATION WAS PERFORMED  Ranelle Oyster 04/19/2019, 11:00 AM

## 2019-04-20 ENCOUNTER — Inpatient Hospital Stay (HOSPITAL_COMMUNITY): Payer: 59 | Admitting: Occupational Therapy

## 2019-04-20 LAB — GLUCOSE, CAPILLARY
Glucose-Capillary: 134 mg/dL — ABNORMAL HIGH (ref 70–99)
Glucose-Capillary: 86 mg/dL (ref 70–99)
Glucose-Capillary: 112 mg/dL — ABNORMAL HIGH (ref 70–99)
Glucose-Capillary: 131 mg/dL — ABNORMAL HIGH (ref 70–99)

## 2019-04-20 NOTE — Progress Notes (Signed)
[place] PHYSICAL MEDICINE & REHABILITATION PROGRESS NOTE  Subjective/Complaints: Left heel a little sore. Otherwise feels good.   ROS: Patient denies fever, rash, sore throat, blurred vision, nausea, vomiting, diarrhea, cough, shortness of breath or chest pain,  headache, or mood change.    Objective: Vital Signs: Blood pressure 134/89, pulse 78, temperature 98.3 F (36.8 C), temperature source Oral, resp. rate 18, height  (1.702 m), weight 79 kg, SpO2 100 %. Dg Swallowing Func-speech Pathology  Result Date: 04/18/2019 Objective Swallowing Evaluation: Type of Study: MBS-Modified Barium Swallow Study  Patient Details Name: Benjamin Cohen MRN: [id number] Date of Birth: 16-Nov-1956 Today's Date: 04/18/2019 Time: SLP Start Time (ACUTE ONLY): 1415 -SLP Stop Time (ACUTE ONLY): 1500 SLP Time Calculation (min) (ACUTE ONLY): 45 min Past Medical History: Past Medical History: Diagnosis Date . Diabetes mellitus without complication (HCC)  . Hypertension  . Stroke [facility])  Past Surgical History: Past Surgical History: Procedure Laterality Date . NO PAST SURGERIES   HPI: 63 y/o M admitted 3/31 COVID (+).Developed worsening hypoxemia, respiratory distress 4/1 and PCCM consulted for evaluation. Intubated 4/2 with ARDS.Was extubated 03/23/2019, with no oxygen requirement. Initial clinical swallow evaluation 4/13 revealed concerns for severe dysphagia; NPO was recommended, TF.  Pt developed respiratory distress and was reintubated on 03/28/2019; extubated 4/20. Hx prior strokes.  Subjective: alert Assessment / Plan / Recommendation CHL IP CLINICAL IMPRESSIONS 04/18/2019 Clinical Impression Pt presents with mild oropharyngeal dysphagia c/b mild difficulty with bolus cohesion (especially when consuming whole barium tablet with thin liquids, applesauce aided in orally clearing pill). Despite this, pt able to effectively clear oral cavity with dysphagia 3 textures. Additionally, pt presents with swallow initiaiton at  pyriform sinuses when consuming thin liquids. No penetration or aspiration were observed even with challenging boluses of thin liquids. Recommend dysphagia 3 with thin liquids,  medicine whole in puree and intermittent supervision.  SLP Visit Diagnosis Dysphagia, oropharyngeal phase (R13.12) Attention and concentration deficit following -- Frontal lobe and executive function deficit following -- Impact on safety and function Mild aspiration risk   CHL IP TREATMENT RECOMMENDATION 04/09/2019 Treatment Recommendations Therapy as outlined in treatment plan below   Prognosis 04/09/2019 Prognosis for Safe Diet Advancement Good Barriers to Reach Goals Cognitive deficits;Time post onset;Severity of deficits Barriers/Prognosis Comment -- CHL IP DIET RECOMMENDATION 04/18/2019 SLP Diet Recommendations Dysphagia 3 (Mech soft) solids;Thin liquid Liquid Administration via Cup;Straw Medication Administration Whole meds with puree Compensations Minimize environmental distractions;Slow rate;Small sips/bites Postural Changes --   CHL IP OTHER RECOMMENDATIONS 04/09/2019 Recommended Consults -- Oral Care Recommendations Oral care BID Other Recommendations Order thickener from pharmacy;Prohibited food (jello, ice cream, thin soups);Remove water pitcher   CHL IP FOLLOW UP RECOMMENDATIONS 04/10/2019 Follow up Recommendations Inpatient Rehab   CHL IP FREQUENCY AND DURATION 04/09/2019 Speech Therapy Frequency (ACUTE ONLY) min 2x/week Treatment Duration 2 weeks      CHL IP ORAL PHASE 04/18/2019 Oral Phase Impaired Oral - Pudding Teaspoon -- Oral - Pudding Cup -- Oral - Honey Teaspoon -- Oral - Honey Cup -- Oral - Nectar Teaspoon NT Oral - Nectar Cup NT Oral - Nectar Straw NT Oral - Thin Teaspoon -- Oral - Thin Cup Premature spillage Oral - Thin Straw Premature spillage Oral - Puree NT Oral - Mech Soft Premature spillage Oral - Regular -- Oral - Multi-Consistency -- Oral - Pill Reduced posterior propulsion;Weak lingual manipulation;Delayed oral  transit;Decreased bolus cohesion Oral Phase - Comment --  CHL IP PHARYNGEAL PHASE 04/18/2019 Pharyngeal Phase Impaired Pharyngeal- Pudding Teaspoon --  Pharyngeal -- Pharyngeal- Pudding Cup -- Pharyngeal -- Pharyngeal- Honey Teaspoon -- Pharyngeal -- Pharyngeal- Honey Cup -- Pharyngeal -- Pharyngeal- Nectar Teaspoon NT Pharyngeal -- Pharyngeal- Nectar Cup NT Pharyngeal -- Pharyngeal- Nectar Straw NT Pharyngeal -- Pharyngeal- Thin Teaspoon Delayed swallow initiation-pyriform sinuses Pharyngeal -- Pharyngeal- Thin Cup Delayed swallow initiation-pyriform sinuses Pharyngeal -- Pharyngeal- Thin Straw Delayed swallow initiation-pyriform sinuses Pharyngeal -- Pharyngeal- Puree Delayed swallow initiation-vallecula Pharyngeal -- Pharyngeal- Mechanical Soft Delayed swallow initiation-vallecula Pharyngeal -- Pharyngeal- Regular -- Pharyngeal -- Pharyngeal- Multi-consistency -- Pharyngeal -- Pharyngeal- Pill Delayed swallow initiation-vallecula Pharyngeal -- Pharyngeal Comment --  CHL IP CERVICAL ESOPHAGEAL PHASE 04/18/2019 Cervical Esophageal Phase WFL Pudding Teaspoon -- Pudding Cup -- Honey Teaspoon -- Honey Cup -- Nectar Teaspoon -- Nectar Cup -- Nectar Straw -- Thin Teaspoon -- Thin Cup -- Thin Straw -- Puree -- Mechanical Soft -- Regular -- Multi-consistency -- Pill -- Cervical Esophageal Comment -- Benjamin Cohen 04/18/2019, 9:49 AM              No results for input(s): WBC, HGB, HCT, PLT in the last 72 hours. No results for input(s): NA, K, CL, CO2, GLUCOSE, BUN, CREATININE, CALCIUM in the last 72 hours.  Physical Exam: BP 134/89 (BP Location: Right Arm)   Pulse 78   Temp 98.3 F (36.8 C) (Oral)   Resp 18   Ht 5\' 7"  (1.702 m)   Wt 79 kg   SpO2 100%   BMI 27.28 kg/m  Constitutional: No distress . Vital signs reviewed. HEENT: EOMI, oral membranes moist Neck: supple Cardiovascular: RRR without murmur. No JVD    Respiratory: CTA Bilaterally without wheezes or rales. Normal effort    GI: BS +, non-tender,  non-distended  Musc:  Improved Left shoulder Neurological: Alert and oriented to person, month, place, reason Motor: 4/5. Fair coordination Psychiatric: flat.more engaging though,  Skin: left heel with dried blister, minimal drainage.stable  Assessment/Plan: 1. Functional deficits secondary to debility which require 3+ hours per day of interdisciplinary therapy in a comprehensive inpatient rehab setting.  Physiatrist is providing close team supervision and 24 hour management of active medical problems listed below.  Physiatrist and rehab team continue to assess barriers to discharge/monitor patient progress toward functional and medical goals  Care Tool:  Bathing  Bathing activity did not occur: Refused Body parts bathed by patient: Face, Front perineal area, Buttocks, Right upper leg, Left upper leg, Right arm, Left arm, Right lower leg, Chest, Abdomen, Left lower leg         Bathing assist Assist Level: Supervision/Verbal cueing     Upper Body Dressing/Undressing Upper body dressing   What is the patient wearing?: Pull over shirt    Upper body assist Assist Level: Contact Guard/Touching assist    Lower Body Dressing/Undressing Lower body dressing      What is the patient wearing?: Pants, Incontinence brief     Lower body assist Assist for lower body dressing: Supervision/Verbal cueing     Toileting Toileting    Toileting assist Assist for toileting: Contact Guard/Touching assist     Transfers Chair/bed transfer  Transfers assist     Chair/bed transfer assist level: Supervision/Verbal cueing     Locomotion Ambulation   Ambulation assist      Assist level: Minimal Assistance - Patient > 75% Assistive device: No Device Max distance: 150'   Walk 10 feet activity   Assist     Assist level: Minimal Assistance - Patient > 75% Assistive device: No Device   Walk 50 feet  activity   Assist Walk 50 feet with 2 turns activity did not occur:  Safety/medical concerns  Assist level: Minimal Assistance - Patient > 75% Assistive device: No Device    Walk 150 feet activity   Assist Walk 150 feet activity did not occur: Safety/medical concerns  Assist level: Minimal Assistance - Patient > 75% Assistive device: No Device    Walk 10 feet on uneven surface  activity   Assist Walk 10 feet on uneven surfaces activity did not occur: Safety/medical concerns         Wheelchair     Assist Will patient use wheelchair at discharge?: No Type of Wheelchair: Manual    Wheelchair assist level: Supervision/Verbal cueing Max wheelchair distance: 100'    Wheelchair 50 feet with 2 turns activity    Assist        Assist Level: Supervision/Verbal cueing   Wheelchair 150 feet activity     Assist Wheelchair 150 feet activity did not occur: Safety/medical concerns          Medical Problem List and Plan: 1.Functional deficits and impaired mobilitysecondary to debility/encephalopathy after COVID 19 infection and subsequent respiratory failure/ARDS  --Continue CIR therapies including PT, OT, and SLP   -continue contact precautions for MRSA  2. Antithrombotics: -DVT/anticoagulation:Pharmaceutical:Lovenox -antiplatelet therapy: Plavix 3. Pain Management:tylenol prn  --AC jt arthritis Left shoulder, mild RTC  -voltaren gel  -ice prn  -improved 4. Mood:LCSW to follow for evaluation when appropriate. -antipsychotic agents: increased seroquel qhs to 50mg  for better sleep  -Seroquelprn 5. Neuropsych: This patientis notcapable of making decisions on hisown behalf.    -improving insight and awareness 6. Skin/Wound Care:  foam dressing only left heel  -pressure relief when sitting/lying 7. Fluids/Electrolytes/Nutrition:Monitor I/O.   Encourage PO  -appears to be eating well, good appetite 8. Metabolic encephalopathy/Delirium:  See #4 -improved 9.  EQA:STMHDQQ BP bid --continue metoprolol for BP/HR control.  bp improved with metoprolol 50mg  bid--no changes 10. H/o CVA: OnPlavix. 11. T2DM:Hgb A1c- 12.4 --has history of non-compliance per records.  -on amaryl at home--resumed at 1mg  daily  5/4  Sugars under good control 12. Dysphagia:On regular diet now and tolerating well.         LOS: 9 days A FACE TO FACE EVALUATION WAS PERFORMED  Ranelle Oyster 04/20/2019, 9:13 AM

## 2019-04-20 NOTE — Progress Notes (Signed)
Occupational Therapy Session Note  Patient Details  Name: Benjamin Cohen MRN: [id number] Date of Birth: 1956-08-31  Today's Date: 04/20/2019 OT Individual Time: 6754-4920 OT Individual Time Calculation (min): 40 min    Short Term Goals: Week 2:  OT Short Term Goal 1 (Week 2): STG=LTG due to LOS  Skilled Therapeutic Interventions/Progress Updates:    Pt seen for OT ADL bathing/dressing session. Pt sitting EOB upon arrival, alert and agreeable to tx session, denying pain. He ambulated throughout room with close supervision, occasionally reaching out to furniture to steady himself on. Throughout session, VCs/education provide for safety awareness and energy conservation techniques, however, pt with minimal carryover of cuing.  He gathered clothing items from duffel bag in prep for bathing task. He bathed seated on tub bench in shower with supervision, standing with guarding assist and occasional use of grab bars for balance. He returned to EOB to dress with supervision and VCs for safety and attention. Pt impulsive with all mobility with poor safety awareness and appears restless even during functional tasks with difficulty maintaining attention to task to see task to completion.  Pt left seated in w/c at end of session, chair belt alarm on and all needs in reach.   Therapy Documentation Precautions:  Precautions Precautions: Fall Precaution Comments: High risk for falls/telesitter Restrictions Weight Bearing Restrictions: No Pain:   No/denies pain   Therapy/Group: Individual Therapy  Derrius Furtick L 04/20/2019, 6:44 AM

## 2019-04-21 ENCOUNTER — Inpatient Hospital Stay (HOSPITAL_COMMUNITY): Payer: 59 | Admitting: Speech Pathology

## 2019-04-21 ENCOUNTER — Inpatient Hospital Stay (HOSPITAL_COMMUNITY): Payer: 59 | Admitting: Physical Therapy

## 2019-04-21 ENCOUNTER — Inpatient Hospital Stay (HOSPITAL_COMMUNITY): Payer: 59 | Admitting: Occupational Therapy

## 2019-04-21 LAB — BASIC METABOLIC PANEL
Anion gap: 13 (ref 5–15)
BUN: 13 mg/dL (ref 8–23)
CO2: 25 mmol/L (ref 22–32)
Calcium: 8.7 mg/dL — ABNORMAL LOW (ref 8.9–10.3)
Chloride: 100 mmol/L (ref 98–111)
Creatinine, Ser: 1.02 mg/dL (ref 0.61–1.24)
GFR calc Af Amer: >60 mL/min (ref >60)
GFR calc non Af Amer: >60 mL/min (ref >60)
Glucose, Bld: 195 mg/dL — ABNORMAL HIGH (ref 70–99)
Potassium: 3.4 mmol/L — ABNORMAL LOW (ref 3.5–5.1)
Sodium: 138 mmol/L (ref 135–145)

## 2019-04-21 LAB — CBC
HCT: 33.1 % — ABNORMAL LOW (ref 39.0–52.0)
Hemoglobin: 10.4 g/dL — ABNORMAL LOW (ref 13.0–17.0)
MCH: 26.8 pg (ref 26.0–34.0)
MCHC: 31.4 g/dL (ref 30.0–36.0)
MCV: 85.3 fL (ref 80.0–100.0)
Platelets: 288 10*3/uL (ref 150–400)
RBC: 3.88 MIL/uL — ABNORMAL LOW (ref 4.22–5.81)
RDW: 14.1 % (ref 11.5–15.5)
WBC: 4.5 10*3/uL (ref 4.0–10.5)
nRBC: 0 % (ref 0.0–0.2)

## 2019-04-21 LAB — GLUCOSE, CAPILLARY
Glucose-Capillary: 129 mg/dL — ABNORMAL HIGH (ref 70–99)
Glucose-Capillary: 117 mg/dL — ABNORMAL HIGH (ref 70–99)
Glucose-Capillary: 146 mg/dL — ABNORMAL HIGH (ref 70–99)
Glucose-Capillary: 192 mg/dL — ABNORMAL HIGH (ref 70–99)

## 2019-04-21 NOTE — Progress Notes (Signed)
Speech Language Pathology Daily Session Note  Patient Details  Name: ALLYSON HOLZHAUSEN MRN: [id number] Date of Birth: 12/12/1955  Today's Date: 04/21/2019 SLP Individual Time: 0730-0830 SLP Individual Time Calculation (min): 60 min  Short Term Goals: Week 2: SLP Short Term Goal 1 (Week 2): Patient will demosntrate selective attention to functional tasks for ~15 minutes with Min A verbal cues for redirction.  SLP Short Term Goal 2 (Week 2): Patient will demonstrate functional problem solving for basic and familiar tasks with Min A verbal cues.  SLP Short Term Goal 3 (Week 2): Patient will utilize external aids to maximize recall of functional information with Min A multmodal cues.  SLP Short Term Goal 4 (Week 2): Patient will identify 2 physical and 2 cognitive deficits with Mod A multimodal cues.  SLP Short Term Goal 5 (Week 2): Patient will consume current diet with minimal overt s/s of aspiration with Mod I  for use of swallowing strategies.   Skilled Therapeutic Interventions:  Skilled treatment session focused on cognition goals. SLP facilitated session by providing Mod A cues to complete semi-complex deductive reasoning puzzles. Of note, pt required Min A cues for reading and spelling basic words within text. Pt with no insight into difficulty completing task. Pt continues to benefit from Min A cues for selective attention for ~ 30 minutes in mildly distracting environment. SLP reviewed need for 24 hours supervision and help with money/medication management at discharge. Pt voices generalized agreement with information. Pt left upright in wheelchair, lap belt alarm on and all needs within reach. Tele-sitter present. Continue per current plan of care.      Pain    Therapy/Group: Individual Therapy  Leon Montoya 04/21/2019, 9:29 AM

## 2019-04-21 NOTE — Progress Notes (Signed)
Physical Therapy Weekly Progress Note  Patient Details  Name: Benjamin Cohen MRN: [id number] Date of Birth: 09-06-1956  Beginning of progress report period: Apr 12, 2019 End of progress report period: Apr 21, 2019  Today's Date: 04/21/2019 PT Individual Time: 1330-1415 PT Individual Time Calculation (min): 45 min   Patient has met 4 of 4 short term goals.  Pt is at Supervision level for bed mobility and transfers and requires CGA for gait up to 150 ft and for 12 stairs with one handrail. Pt's is currently limited by decreased endurance and cognitive deficits that affect safety awareness. Pt's vitals have been WNL during therapy sessions.  Patient continues to demonstrate the following deficits decreased cardiorespiratoy endurance, decreased attention, decreased awareness, decreased problem solving, decreased safety awareness, decreased memory and delayed processing and decreased standing balance, decreased postural control and decreased balance strategies and therefore will continue to benefit from skilled PT intervention to increase functional independence with mobility.  Patient progressing toward long term goals..  Continue plan of care.  PT Short Term Goals Week 1:  PT Short Term Goal 1 (Week 1): Patient to tolerate 10 minutes continuous activite prior to needing rest. PT Short Term Goal 1 - Progress (Week 1): Met PT Short Term Goal 2 (Week 1): Patient to perform transfers with CGA demonstrating safe technique 50% of the time. PT Short Term Goal 2 - Progress (Week 1): Met PT Short Term Goal 3 (Week 1): Patient to ambulate with LRAD and CGA x 80'.  PT Short Term Goal 3 - Progress (Week 1): Met PT Short Term Goal 4 (Week 1): Patient to negotiate 4 (6") steps with min A with rails.  PT Short Term Goal 4 - Progress (Week 1): Met Week 2:  PT Short Term Goal 1 (Week 2): =LTG due to ELOS  Skilled Therapeutic Interventions/Progress Updates:    Pt received supine in bed, agreeable to PT  session. No complaints of pain. Bed mobility Supervision. Sit to stand with Supervision. Ambulation 3 x 150 ft with no AD and close SBA to CGA, occasional ataxia. Ascend/descend 12 stairs with R handrail and CGA, step-through gait pattern to step-to gait pattern with onset of fatigue. Static standing balance: ball toss against rebounder normal stance and Romberg stance with close SBA; normal stance and Romberg stance on airex with CGA. Dynamic standing balance: sidesteps with cone tap 3 x 10 ft L/R with CGA for balance. Pt request hot pack for L shoulder at end of session, pain not rated. Pt left seated in w/c in room with needs in reach, quick release belt and chair alarm in place, telesitter present, needs in reach.  Therapy Documentation Precautions:  Precautions Precautions: Fall Precaution Comments: High risk for falls/telesitter Restrictions Weight Bearing Restrictions: No       Therapy/Group: Individual Therapy   Excell Seltzer, PT, DPT 04/21/2019, 3:31 PM

## 2019-04-21 NOTE — Progress Notes (Signed)
[place] PHYSICAL MEDICINE & REHABILITATION PROGRESS NOTE  Subjective/Complaints: No new issues. Pain improved. Working on Psychologist, educational problems with SLP  ROS: Patient denies fever, rash, sore throat, blurred vision, nausea, vomiting, diarrhea, cough, shortness of breath or chest pain, joint or back pain, headache, or mood change.     Objective: Vital Signs: Blood pressure 116/76, pulse 81, temperature 98.2 F (36.8 C), temperature source Oral, resp. rate 18, height 5\' 7"  (1.702 m), weight 86.1 kg, SpO2 97 %. No results found. Recent Labs    04/21/19 0717  WBC 4.5  HGB 10.4*  HCT 33.1*  PLT 288   Recent Labs    04/21/19 0717  NA 138  K 3.4*  CL 100  CO2 25  GLUCOSE 195*  BUN 13  CREATININE 1.02  CALCIUM 8.7*    Physical Exam: BP 116/76 (BP Location: Right Arm)   Pulse 81   Temp 98.2 F (36.8 C) (Oral)   Resp 18   Ht 5\' 7"  (1.702 m)   Wt 86.1 kg   SpO2 97%   BMI 29.73 kg/m  Constitutional: No distress . Vital signs reviewed. HEENT: EOMI, oral membranes moist Neck: supple Cardiovascular: RRR without murmur. No JVD    Respiratory: CTA Bilaterally without wheezes or rales. Normal effort    GI: BS +, non-tender, non-distended  Musc:  Improved Left shoulder ROM Neurological: Alert and oriented to person, month, place, reason Motor: 4/5. Fair coordination Psychiatric: flat.more engaging though,  Skin: left heel with dried blister, stable  Assessment/Plan: 1. Functional deficits secondary to debility which require 3+ hours per day of interdisciplinary therapy in a comprehensive inpatient rehab setting.  Physiatrist is providing close team supervision and 24 hour management of active medical problems listed below.  Physiatrist and rehab team continue to assess barriers to discharge/monitor patient progress toward functional and medical goals  Care Tool:  Bathing  Bathing activity did not occur: Refused Body parts bathed by patient: Face, Front perineal area,  Buttocks, Right upper leg, Left upper leg, Right arm, Left arm, Right lower leg, Chest, Abdomen, Left lower leg         Bathing assist Assist Level: Supervision/Verbal cueing     Upper Body Dressing/Undressing Upper body dressing   What is the patient wearing?: Pull over shirt    Upper body assist Assist Level: Supervision/Verbal cueing    Lower Body Dressing/Undressing Lower body dressing      What is the patient wearing?: Pants, Incontinence brief     Lower body assist Assist for lower body dressing: Supervision/Verbal cueing     Toileting Toileting    Toileting assist Assist for toileting: Contact Guard/Touching assist     Transfers Chair/bed transfer  Transfers assist     Chair/bed transfer assist level: Supervision/Verbal cueing     Locomotion Ambulation   Ambulation assist      Assist level: Minimal Assistance - Patient > 75% Assistive device: No Device Max distance: 150'   Walk 10 feet activity   Assist     Assist level: Minimal Assistance - Patient > 75% Assistive device: No Device   Walk 50 feet activity   Assist Walk 50 feet with 2 turns activity did not occur: Safety/medical concerns  Assist level: Minimal Assistance - Patient > 75% Assistive device: No Device    Walk 150 feet activity   Assist Walk 150 feet activity did not occur: Safety/medical concerns  Assist level: Minimal Assistance - Patient > 75% Assistive device: No Device    Walk  10 feet on uneven surface  activity   Assist Walk 10 feet on uneven surfaces activity did not occur: Safety/medical concerns         Wheelchair     Assist Will patient use wheelchair at discharge?: No Type of Wheelchair: Manual    Wheelchair assist level: Supervision/Verbal cueing Max wheelchair distance: 100'    Wheelchair 50 feet with 2 turns activity    Assist        Assist Level: Supervision/Verbal cueing   Wheelchair 150 feet activity     Assist  Wheelchair 150 feet activity did not occur: Safety/medical concerns          Medical Problem List and Plan: 1.Functional deficits and impaired mobilitysecondary to debility/encephalopathy after COVID 19 infection and subsequent respiratory failure/ARDS  --Continue CIR therapies including PT, OT, and SLP   -continue contact precautions for MRSA  2. Antithrombotics: -DVT/anticoagulation:Pharmaceutical:Lovenox  -antiplatelet therapy: Plavix 3. Pain Management:tylenol prn  --AC jt arthritis Left shoulder, mild RTC  -voltaren gel  -ice prn  -improved 4. Mood:LCSW to follow for evaluation when appropriate. -antipsychotic agents: increased seroquel qhs to 50mg  for better sleep  -Seroquelprn 5. Neuropsych: This patientis notcapable of making decisions on hisown behalf.    -improving insight and awareness 6. Skin/Wound Care:  foam dressing only left heel  -pressure relief when sitting/lying 7. Fluids/Electrolytes/Nutrition:Monitor I/O.   Encourage PO  -excellent intake 8. Metabolic encephalopathy/Delirium:  See #4 -improved 9. ZOX:WRUEAVWHTN:Monitor BP bid --continue metoprolol for BP/HR control.  bp improved with metoprolol 50mg  bid--no changes 10. H/o CVA: OnPlavix. 11. T2DM:Hgb A1c- 12.4 --has history of non-compliance per records.  -on amaryl at home--resumed at 1mg  daily  5/4  Sugars under good control 12. Dysphagia:On regular diet now and tolerating well.         LOS: 10 days A FACE TO FACE EVALUATION WAS PERFORMED  Benjamin Cohen 04/21/2019, 10:29 AM

## 2019-04-21 NOTE — Progress Notes (Signed)
Physical Therapy Session Note  Patient Details  Name: Benjamin Cohen MRN: [id number] Date of Birth: 1956-01-02  Today's Date: 04/21/2019 PT Individual Time: 0800-0830 PT Individual Time Calculation (min): 30 min   Short Term Goals: Week 1:  PT Short Term Goal 1 (Week 1): Patient to tolerate 10 minutes continuous activite prior to needing rest. PT Short Term Goal 1 - Progress (Week 1): Met PT Short Term Goal 2 (Week 1): Patient to perform transfers with CGA demonstrating safe technique 50% of the time. PT Short Term Goal 2 - Progress (Week 1): Met PT Short Term Goal 3 (Week 1): Patient to ambulate with LRAD and CGA x 80'.  PT Short Term Goal 3 - Progress (Week 1): Met PT Short Term Goal 4 (Week 1): Patient to negotiate 4 (6") steps with min A with rails.  PT Short Term Goal 4 - Progress (Week 1): Met  Skilled Therapeutic Interventions/Progress Updates: Pt presented in w/c agreeable to therapy. Denies pain at beginning of session. Donned pants minA for threading pants for time management. Pt ambulated to day room CGA and performed NuStep L7 x 4 min then L5 x 6 min for endurance and global strengthening. Pt indicated some pain in R knee which resolved with rest. Pt then ambulated 280f CGA fading to MinA with fatigue and decreased B foot clearance. Pt ambulated additional 2024fafter seated rest, pt noted to have x1 LOB to L during ambulation requiring minA from PTA for recovery. Pt returned to room at end of session and returned to w/c. Pt left with belt alarm on, call bell within reach and needs met.  BaTraining and development officermbulation/gait training;Cognitive remediation/compensation;DME/adaptive equipment instruction;Functional mobility training;Therapeutic Activities;UE/LE Strength taining/ROM;Therapeutic Exercise;Stair training;Patient/family education;Neuromuscular re-education;Community reintegration;Discharge planning;Disease management/prevention;Pain management;UE/LE Coordination  activities   Therapy Documentation Precautions:  Precautions Precautions: Fall Precaution Comments: High risk for falls/telesitter Restrictions Weight Bearing Restrictions: No General:   Vital Signs: Therapy Vitals Temp: 98.2 F (36.8 C) Temp Source: Oral Pulse Rate: 79 Resp: 18 BP: 116/78 Patient Position (if appropriate): Sitting Oxygen Therapy SpO2: 99 % O2 Device: Room Air   Therapy/Group: Individual Therapy  Kallum Jorgensen  Kinston Magnan, PTA  04/21/2019, 5:03 PM

## 2019-04-21 NOTE — Progress Notes (Signed)
Occupational Therapy Session Note  Patient Details  Name: Benjamin Cohen MRN: [id number] Date of Birth: 04-04-1956  Today's Date: 04/21/2019 OT Individual Time: 1030-1130 OT Individual Time Calculation (min): 60 min    Short Term Goals: Week 2:  OT Short Term Goal 1 (Week 2): STG=LTG due to LOS  Skilled Therapeutic Interventions/Progress Updates:    Pt seen for OT session focusing on ADL re-training, functional activity tolerance, and attention to task. Pt asleep in supine arrival, requiring increased time and stimuli for arousal. Agreeable to tx session and denying pain.  He ambulated throughout session with close supervision, no AD and VCs not to reach out for environmental aids for balance. Completed face washing task at sink then ambulated into bathroom. Continent BM and toileting task completed with supervision. Oral care completed standing at sink. Pt impulsive and unsafe when looking for seated rest breaks, attempting to sit in unlocked w/c with leg rests in place. Attempted to re-direct to sit EOB for rest break and education provided regarding safety and impulsivity. He self propelled w/c (pt's choice) to therapy day room. Completed table top game with emphasis on attention to task. Completed ~5 minutes of game in moderately stimulating environment supervision initially progressing to requiring min cuing for attention when fatigued.  Standing Connect-4 game with mod cuing required for turn taking and strategy of game.  Completed scavenger hunt with verbal directions provided to locate ADL apartment. Pt able to recall and follow physical directions to ambulate, however, was unable then to recall what room or item he was looking for. He was able to path find back to room independently, ambulating with supervision to return to room. Pt with decreased awareness to environmental obstacles in highly stimulating environment requiring VCs for awareness.  Pt left seated in w/c at end of session,  chair belt alarm on and all needs in reach.    Therapy Documentation Precautions:  Precautions Precautions: Fall Precaution Comments: High risk for falls/telesitter Restrictions Weight Bearing Restrictions: No   Therapy/Group: Individual Therapy  Kinda Pottle L 04/21/2019, 7:05 AM

## 2019-04-22 ENCOUNTER — Inpatient Hospital Stay (HOSPITAL_COMMUNITY): Payer: 59

## 2019-04-22 ENCOUNTER — Inpatient Hospital Stay (HOSPITAL_COMMUNITY): Payer: 59 | Admitting: Occupational Therapy

## 2019-04-22 LAB — GLUCOSE, CAPILLARY
Glucose-Capillary: 134 mg/dL — ABNORMAL HIGH (ref 70–99)
Glucose-Capillary: 122 mg/dL — ABNORMAL HIGH (ref 70–99)
Glucose-Capillary: 148 mg/dL — ABNORMAL HIGH (ref 70–99)
Glucose-Capillary: 258 mg/dL — ABNORMAL HIGH (ref 70–99)

## 2019-04-22 MED ORDER — GLIMEPIRIDE 2 MG PO TABS
2.0000 mg | ORAL_TABLET | Freq: Every day | ORAL | Status: DC
  Administered 2019-04-23 – 2019-04-26 (×4): 2 mg via ORAL
  Filled 2019-04-22 (×4): qty 1

## 2019-04-22 NOTE — Progress Notes (Signed)
Occupational Therapy Session Note  Patient Details  Name: Benjamin Cohen MRN: [id number] Date of Birth: [birth year]  Today's Date: 04/22/2019 OT Individual Time: 1030-1130 OT Individual Time Calculation (min): 60 min    Short Term Goals: Week 2:  OT Short Term Goal 1 (Week 2): STG=LTG due to LOS  Skilled Therapeutic Interventions/Progress Updates:    Pt seen for OT ADL bathing/dressing session. Pt asleep in supine upon arrival, increased time and stimuli for arousal, however, once awake, pt agreeable to tx session and denying pain. He gathered clothing items from duffel bag while seated on EOB in prep for bathing task.  He ambulated throughout room with supervision, no AD. Pt attempting to doff pants from standing position to unthread around feet, demonstrates poor safety awareness and problem solving when attempting to find ways to maintain dynamic standing balance. VCs to initiate sitting on tub bench to completely doff pants. He bathed seated on tub transfer bench with supervision, standing with use of grab bars to complete pericare/buttock hygiene. He returned to standard chair to dress with increased time and supervision for standing portions. Throughout session, pt required increased time and self-initiated rest breaks. Following seated rest break, he ambulated to ADL apartment with supervision. Education/demonstration provided for use of tub transfer bench for tub/shower transfer as he has at home. Completed simulated transfer with supervision, believed this method would work for d/c. Recommendation made to pt to sit to complete bathing tasks for energy conservation and to reduce risk of fall.  Dual task when ambulating back to room, having pt bounce ball while naming food items. Pt required max-total cuing in order to multi-task and word find names of food. Pt appeared to have more realization into his cognitive deficits with this task. Pt returned to room and left seated in standard chair  with chair belt alarm on and all needs in reach.  Pt cont to require VCs for safety awareness with poor carry over of education within session.   Therapy Documentation Precautions:  Precautions Precautions: Fall Precaution Comments: High risk for falls/telesitter Restrictions Weight Bearing Restrictions: No   Therapy/Group: Individual Therapy  Benjamin Cohen 04/22/2019, 7:06 AM

## 2019-04-22 NOTE — Patient Care Conference (Signed)
Inpatient RehabilitationTeam Conference and Plan of Care Update Date: 04/22/2019   Time: 2:10 PM    Patient Name: Benjamin Cohen      Medical Record Number: [id number]  Date of Birth: [birth year] Sex: Male         Room/Bed: 4W17C/4W17C-01 Payor Info: Payor: Theme park manager / Plan: [employer] OTHER / Product Type: *No Product type* /    Admitting Diagnosis: NTBI, enceph; 0302C; 12-14days  Admit Date/Time:  04/11/2019  4:15 PM Admission Comments: No comment available   Primary Diagnosis:  <principal problem not specified> Principal Problem: <principal problem not specified>  Patient Active Problem List   Diagnosis Date Noted  . Labile blood glucose   . Labile blood pressure   . Diabetes mellitus type 2 in nonobese (HCC)   . Essential hypertension   . Metabolic encephalopathy   . 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 Debility 04/11/2019  . Staphylococcal pneumonia ([place])   . Dysphagia   . Pressure injury of skin 03/23/2019  . Acute respiratory failure with hypoxia ([place]) 03/11/2019  . Noncompliance   . History of stroke   . Cerebral thrombosis with cerebral infarction 04/29/2018  . CVA (cerebral vascular accident) ([place]) 04/29/2018  . HLD (hyperlipidemia) 01/04/2015  . Diabetes ([place]) 11/14/2013  . Cerebral infarction ([place]) 11/12/2013  . Hypertensive urgency 11/12/2013    Expected Discharge Date: Expected Discharge Date: 04/26/19  Team Members Present: Physician leading conference: Dr. Alger Simons Social Worker Present: Lennart Pall, LCSW Nurse Present: Isla Pence, RN PT Present: Other (comment)(Cyndi Rick Duff, PT) OT Present: Amy Rounds, OT PPS Coordinator present : Gunnar Fusi     Current Status/Progress Goal Weekly Team Focus  Medical   improving activity tolerance, wounds healing, pain improved  see prior  finalize dc plan   Bowel/Bladder   continent of bowel & bladder, LBM 04/21/19  remain continent  continue to monitor & assist as  needed   Swallow/Nutrition/ Hydration   regular and thin Mod I  Supervision - goal met  tolerance of diet and carryover of strategies    ADL's   Close supervision-CGA overall with mod cuing for safety awareness, attention to task and impulsivity. Requires increased time and rest breaks for all ADLs  Supervision overall  Functional activity tolerance, cognitive remediation, ADL re-training, family ed, and d/c planning   Mobility   Supervision bed mobility and transfers, gait up to 150' no AD CGA, CGA 12 stairs with one handrail  Supervision overall  endurance, safety, balance   Communication             Safety/Cognition/ Behavioral Observations  Mod-Min A, Max A awareness  Min-Supervision A, downgraded to Mod A  problem solving mildly complex, recall with aid, emergent awareness and selective attention   Pain   no c/o pain, has tylenol prn, voltaren gel to left shoulder, refused the lidocaine patch tonight  pain scale <2/10  assess & treat as needed   Skin   area from ET tube to right cheek is healing, posterior neck is healing, DTI to left heal eschar has peeled off leaving pink discoloration, Bil buttock DTI fading  no new areas of skin break down,  continue to treat as ordered & assess Q shuft    Rehab Goals Patient on target to meet rehab goals: Yes *See Care Plan and progress notes for long and short-term goals.     Barriers to Discharge  Current Status/Progress Possible Resolutions Date Resolved   Physician  Nursing                  PT                    OT                  SLP                SW                Discharge Planning/Teaching Needs:  Plan for family to provide 24/7 assistance.  Pt's ex-wife to pick up at d/c and take him to her home with [place] where 24/7 supervision is available.  Teaching still to be planned    Team Discussion:  Continue to progress and no new medical issues.  Cont b/b;  Improved safety and increased call bell use.   Wounds better.  Supervision to CGA overall with therapies.  Still with some limited activity tolerance.  Min-mod with cognition overall.  All therapies want to complete family ed.  SW to arrange.  Revisions to Treatment Plan:  NA    Continued Need for Acute Rehabilitation Level of Care: The patient requires daily medical management by a physician with specialized training in physical medicine and rehabilitation for the following conditions: Daily direction of a multidisciplinary physical rehabilitation program to ensure safe treatment while eliciting the highest outcome that is of practical value to the patient.: Yes Daily medical management of patient stability for increased activity during participation in an intensive rehabilitation regime.: Yes Daily analysis of laboratory values and/or radiology reports with any subsequent need for medication adjustment of medical intervention for : Pulmonary problems;Wound care problems   I attest that I was present, lead the team conference, and concur with the assessment and plan of the team.   Lennart Pall 04/22/2019, 3:28 PM   Team conference was held via web/ teleconference due to [place] - 19

## 2019-04-22 NOTE — Progress Notes (Signed)
Social Work Patient ID: Benjamin Cohen, male   DOB: 1956/06/26, 63 y.o.   MRN: [id number]  Have reviewed team conference with pt and his ex-wife (via phone) with both continue to be agreeable with target date of 04/26/19 and supervision goals overall.  Plan still for pt to go to his ex-wife's home in [place], [place] immediately upon d/c.  Discussed need for family ed and then follow up therapies.  Ex-wife to confirm which day she can be here for education and will alert team.  Continue to follow.  Allyn Bertoni, LCSW

## 2019-04-22 NOTE — Progress Notes (Signed)
[facility] PHYSICAL MEDICINE & REHABILITATION PROGRESS NOTE  Subjective/Complaints: No new issues. Watching news when I walked in. No pain  ROS: Patient denies fever, rash, sore throat, blurred vision, nausea, vomiting, diarrhea, cough, shortness of breath or chest pain, joint or back pain, headache, or mood change.   Objective: Vital Signs: Blood pressure 132/80, pulse 79, temperature 97.8 F (36.6 C), temperature source Oral, resp. rate 16, height 5\' 7"  (1.702 m), weight 86.1 kg, SpO2 98 %. No results found. Recent Labs    04/21/19 0717  WBC 4.5  HGB 10.4*  HCT 33.1*  PLT 288   Recent Labs    04/21/19 0717  NA 138  K 3.4*  CL 100  CO2 25  GLUCOSE 195*  BUN 13  CREATININE 1.02  CALCIUM 8.7*    Physical Exam: BP 132/80 (BP Location: Right Arm)   Pulse 79   Temp 97.8 F (36.6 C) (Oral)   Resp 16   Ht 5\' 7"  (1.702 m)   Wt 86.1 kg   SpO2 98%   BMI 29.73 kg/m  Constitutional: No distress . Vital signs reviewed. HEENT: EOMI, oral membranes moist Neck: supple Cardiovascular: RRR without murmur. No JVD    Respiratory: CTA Bilaterally without wheezes or rales. Normal effort    GI: BS +, non-tender, non-distended   Musc:  Improved Left shoulder ROM Neurological: Alert and oriented to person, month, place, reason Motor: 4/5. Fair coordination Psychiatric:generally pleasant,  Skin: left heel re-epithelializing  Assessment/Plan: 1. Functional deficits secondary to debility which require 3+ hours per day of interdisciplinary therapy in a comprehensive inpatient rehab setting.  Physiatrist is providing close team supervision and 24 hour management of active medical problems listed below.  Physiatrist and rehab team continue to assess barriers to discharge/monitor patient progress toward functional and medical goals  Care Tool:  Bathing  Bathing activity did not occur: Refused Body parts bathed by patient: Face, Front perineal area, Buttocks, Right upper leg, Left  upper leg, Right arm, Left arm, Right lower leg, Chest, Abdomen, Left lower leg         Bathing assist Assist Level: Supervision/Verbal cueing     Upper Body Dressing/Undressing Upper body dressing   What is the patient wearing?: Pull over shirt    Upper body assist Assist Level: Supervision/Verbal cueing    Lower Body Dressing/Undressing Lower body dressing      What is the patient wearing?: Pants, Incontinence brief     Lower body assist Assist for lower body dressing: Supervision/Verbal cueing     Toileting Toileting    Toileting assist Assist for toileting: Contact Guard/Touching assist     Transfers Chair/bed transfer  Transfers assist     Chair/bed transfer assist level: Supervision/Verbal cueing     Locomotion Ambulation   Ambulation assist      Assist level: Contact Guard/Touching assist Assistive device: No Device Max distance: 150'   Walk 10 feet activity   Assist     Assist level: Contact Guard/Touching assist Assistive device: No Device   Walk 50 feet activity   Assist Walk 50 feet with 2 turns activity did not occur: Safety/medical concerns  Assist level: Contact Guard/Touching assist Assistive device: No Device    Walk 150 feet activity   Assist Walk 150 feet activity did not occur: Safety/medical concerns  Assist level: Contact Guard/Touching assist Assistive device: No Device    Walk 10 feet on uneven surface  activity   Assist Walk 10 feet on uneven surfaces activity did  not occur: Safety/medical concerns         Wheelchair     Assist Will patient use wheelchair at discharge?: No Type of Wheelchair: Manual    Wheelchair assist level: Supervision/Verbal cueing Max wheelchair distance: 100'    Wheelchair 50 feet with 2 turns activity    Assist        Assist Level: Supervision/Verbal cueing   Wheelchair 150 feet activity     Assist Wheelchair 150 feet activity did not occur:  Safety/medical concerns          Medical Problem List and Plan: 1.Functional deficits and impaired mobilitysecondary to debility/encephalopathy after COVID 19 infection and subsequent respiratory failure/ARDS  --Continue CIR therapies including PT, OT, and SLP   -continue contact precautions for MRSA  2. Antithrombotics: -DVT/anticoagulation:Pharmaceutical:Lovenox  -antiplatelet therapy: Plavix 3. Pain Management:tylenol prn  -pain improved  -voltaren gel  -ice prn    4. Mood:LCSW to follow for evaluation when appropriate. -antipsychotic agents: increased seroquel qhs to 50mg  for better sleep  -Seroquelprn 5. Neuropsych: This patientis notcapable of making decisions on hisown behalf.    -improving insight and awareness 6. Skin/Wound Care:  foam dressing only left heel  -pressure relief when sitting/lying 7. Fluids/Electrolytes/Nutrition:Monitor I/O.   Encourage PO  -excellent intake 8. Metabolic encephalopathy/Delirium:  See #4 -improved 9. ZOX:WRUEAVWHTN:Monitor BP bid --continue metoprolol for BP/HR control.  bp improved with metoprolol 50mg  bid--no changes 10. H/o CVA: OnPlavix. 11. T2DM:Hgb A1c- 12.4 --has history of non-compliance per records.  -on amaryl at home--resumed at 1mg  daily  5/4---increase to 2mg    Sugars borderline as PO picks up 12. Dysphagia:On regular diet now and tolerating well.         LOS: 11 days A FACE TO FACE EVALUATION WAS PERFORMED  Benjamin Cohen 04/22/2019, 9:09 AM

## 2019-04-22 NOTE — Progress Notes (Signed)
Physical Therapy Session Note  Patient Details  Name: Benjamin Cohen MRN: [id number] Date of Birth: 1956-09-18  Today's Date: 04/22/2019 PT Individual Time: 0833-0930 PT Individual Time Calculation (min): 57 min   Short Term Goals: Week 2:  PT Short Term Goal 1 (Week 2): =LTG due to ELOS  Skilled Therapeutic Interventions/Progress Updates:    Patient in supine asleep, easily aroused, but complains of groggy due to medications.  Performed supine to sit S and transferred to w/c S impulsive without A.  Propelled w/c x 150' with S veering R and correcting each time.  Patient ambulated x 140' with minguard cues for head turns, and faster speed.  Ambulated with ball toss to self and close S cues for increased height of ball toss. Standing on wedge balance while tossing ball to therapist and naming makes or models of cars A-Z.  Patient performed side stepping with crossing over in front or behind with max verbal cues and demonstration, unable to coordinate or understand braiding.  Patient ambulated to room and left in w/c with alarm belt and needs in reach.  Therapy Documentation Precautions:  Precautions Precautions: Fall Precaution Comments: High risk for falls/telesitter Restrictions Weight Bearing Restrictions: No Pain: Pain Assessment Faces Pain Scale: No hurt    Therapy/Group: Individual Therapy  Elray Mcgregor  [place], PT 04/22/2019, 9:08 AM

## 2019-04-22 NOTE — Progress Notes (Signed)
Speech Language Pathology Daily Session Note  Patient Details  Name: MARCIO HOQUE MRN: [id number] Date of Birth: 03-25-1956  Today's Date: 04/22/2019 SLP Individual Time: 1305-1400 SLP Individual Time Calculation (min): 55 min  Short Term Goals: Week 2: SLP Short Term Goal 1 (Week 2): Patient will demosntrate selective attention to functional tasks for ~15 minutes with Min A verbal cues for redirction.  SLP Short Term Goal 2 (Week 2): Patient will demonstrate functional problem solving for basic and familiar tasks with Min A verbal cues.  SLP Short Term Goal 3 (Week 2): Patient will utilize external aids to maximize recall of functional information with Min A multmodal cues.  SLP Short Term Goal 4 (Week 2): Patient will identify 2 physical and 2 cognitive deficits with Mod A multimodal cues.  SLP Short Term Goal 5 (Week 2): Patient will consume current diet with minimal overt s/s of aspiration with Mod I  for use of swallowing strategies.  SLP Short Term Goal 5 - Progress (Week 2): Met  Skilled Therapeutic Interventions: Skilled ST services focused on swallow and cognitive skills. SLP facilitated PO consumption of lunch tray, regular textures and thin diet, pt demonstrated mod I use of swallow strategies with x1 cough, suspect due to distractions. Pt met swallow goal. Pt acknowledged cognitive skills are not the same, however denied specific areas when given options and stated difficulty walking as only physical deficit. SLP facilitated semi-complex problem solving skills utilizing medication management task, pt required mod A verbal cues for verbal and functional problem solving. Pt required max A verbal cues for error awareness during task with ability independent awareness/correctionof errors noted x2. Pt initially stated following task that he could complete medication manage on own, however following second trial of task with similar errors, pt agreed that assistsance is needed. SLP downgraded  LTG for awareness due to lack of progress. Pt was left in room with call bell within reach and chair alarm set. ST recommends to continue skilled ST services.      Pain Pain Assessment Pain Score: 0-No pain  Therapy/Group: Individual Therapy  Abdur Hoglund  [facility] 04/22/2019, 3:53 PM

## 2019-04-23 ENCOUNTER — Inpatient Hospital Stay (HOSPITAL_COMMUNITY): Payer: 59 | Admitting: Physical Therapy

## 2019-04-23 ENCOUNTER — Inpatient Hospital Stay (HOSPITAL_COMMUNITY): Payer: 59 | Admitting: Speech Pathology

## 2019-04-23 ENCOUNTER — Inpatient Hospital Stay (HOSPITAL_COMMUNITY): Payer: 59 | Admitting: Occupational Therapy

## 2019-04-23 LAB — GLUCOSE, CAPILLARY
Glucose-Capillary: 136 mg/dL — ABNORMAL HIGH (ref 70–99)
Glucose-Capillary: 135 mg/dL — ABNORMAL HIGH (ref 70–99)
Glucose-Capillary: 161 mg/dL — ABNORMAL HIGH (ref 70–99)
Glucose-Capillary: 132 mg/dL — ABNORMAL HIGH (ref 70–99)

## 2019-04-23 NOTE — Progress Notes (Signed)
[place] PHYSICAL MEDICINE & REHABILITATION PROGRESS NOTE  Subjective/Complaints: No new problems. Slept well  ROS: Patient denies fever, rash, sore throat, blurred vision, nausea, vomiting, diarrhea, cough, shortness of breath or chest pain, joint or back pain, headache, or mood change.    Objective: Vital Signs: Blood pressure (!) 147/76, pulse 72, temperature 97.6 F (36.4 C), temperature source Oral, resp. rate 16, height 5\' 7"  (1.702 m), weight 88.9 kg, SpO2 98 %. No results found. Recent Labs    04/21/19 0717  WBC 4.5  HGB 10.4*  HCT 33.1*  PLT 288   Recent Labs    04/21/19 0717  NA 138  K 3.4*  CL 100  CO2 25  GLUCOSE 195*  BUN 13  CREATININE 1.02  CALCIUM 8.7*    Physical Exam: BP (!) 147/76 (BP Location: Right Arm)   Pulse 72   Temp 97.6 F (36.4 C) (Oral)   Resp 16   Ht 5\' 7"  (1.702 m)   Wt 88.9 kg   SpO2 98%   BMI 30.70 kg/m  Constitutional: No distress . Vital signs reviewed. HEENT: EOMI, oral membranes moist Neck: supple Cardiovascular: RRR without murmur. No JVD    Respiratory: CTA Bilaterally without wheezes or rales. Normal effort    GI: BS +, non-tender, non-distended    Musc:  Improved Left shoulder ROM Neurological: Alert and oriented to person, month, place, reason Motor: 4/5. Fair coordination Psychiatric:generally pleasant,  Skin: left heel re-epithelializing--dressing intact  Assessment/Plan: 1. Functional deficits secondary to debility which require 3+ hours per day of interdisciplinary therapy in a comprehensive inpatient rehab setting.  Physiatrist is providing close team supervision and 24 hour management of active medical problems listed below.  Physiatrist and rehab team continue to assess barriers to discharge/monitor patient progress toward functional and medical goals  Care Tool:  Bathing  Bathing activity did not occur: Refused Body parts bathed by patient: Face, Front perineal area, Buttocks, Right upper leg,  Left upper leg, Right arm, Left arm, Right lower leg, Chest, Abdomen, Left lower leg         Bathing assist Assist Level: Supervision/Verbal cueing     Upper Body Dressing/Undressing Upper body dressing   What is the patient wearing?: Pull over shirt    Upper body assist Assist Level: Independent    Lower Body Dressing/Undressing Lower body dressing      What is the patient wearing?: Pants, Underwear/pull up     Lower body assist Assist for lower body dressing: Supervision/Verbal cueing     Toileting Toileting    Toileting assist Assist for toileting: Contact Guard/Touching assist     Transfers Chair/bed transfer  Transfers assist     Chair/bed transfer assist level: Supervision/Verbal cueing     Locomotion Ambulation   Ambulation assist      Assist level: Contact Guard/Touching assist Assistive device: No Device Max distance: 150'   Walk 10 feet activity   Assist     Assist level: Contact Guard/Touching assist Assistive device: No Device   Walk 50 feet activity   Assist Walk 50 feet with 2 turns activity did not occur: Safety/medical concerns  Assist level: Contact Guard/Touching assist Assistive device: No Device    Walk 150 feet activity   Assist Walk 150 feet activity did not occur: Safety/medical concerns  Assist level: Contact Guard/Touching assist Assistive device: No Device    Walk 10 feet on uneven surface  activity   Assist Walk 10 feet on uneven surfaces activity did not occur:  Safety/medical concerns         Wheelchair     Assist Will patient use wheelchair at discharge?: No Type of Wheelchair: Manual    Wheelchair assist level: Supervision/Verbal cueing Max wheelchair distance: 100'    Wheelchair 50 feet with 2 turns activity    Assist        Assist Level: Supervision/Verbal cueing   Wheelchair 150 feet activity     Assist Wheelchair 150 feet activity did not occur: Safety/medical  concerns   Assist Level: Supervision/Verbal cueing      Medical Problem List and Plan: 1.Functional deficits and impaired mobilitysecondary to debility/encephalopathy after COVID 19 infection and subsequent respiratory failure/ARDS  --Continue CIR therapies including PT, OT, and SLP ---making progress with functional mobility  -continue contact precautions for MRSA  2. Antithrombotics: -DVT/anticoagulation:Pharmaceutical:Lovenox  -antiplatelet therapy: Plavix 3. Pain Management:tylenol prn  -pain improved  -voltaren gel  -ice prn    4. Mood:LCSW to follow for evaluation when appropriate. -antipsychotic agents: increased seroquel qhs to 50mg  for better sleep  -Seroquelprn 5. Neuropsych: This patientis notcapable of making decisions on hisown behalf.    -improving insight and awareness 6. Skin/Wound Care:  foam dressing only left heel  -pressure relief when sitting/lying 7. Fluids/Electrolytes/Nutrition:Monitor I/O.   Encourage PO  -excellent intake 8. Metabolic encephalopathy/Delirium:  See #4 -improved 9. HOO:ILNZVJK BP bid --continue metoprolol for BP/HR control.  bp improved with metoprolol 50mg  bid--no changes 10. H/o CVA: OnPlavix. 11. T2DM:Hgb A1c- 12.4 --has history of non-compliance per records.  -amaryl-increased to 2mg  beginning today===observe    12. Dysphagia:On regular diet now and tolerating well.         LOS: 12 days A FACE TO FACE EVALUATION WAS PERFORMED  Ranelle Oyster 04/23/2019, 9:41 AM

## 2019-04-23 NOTE — Progress Notes (Signed)
Speech Language Pathology Daily Session Note  Patient Details  Name: PINK MAYE MRN: [id number] Date of Birth: 08/31/1956  Today's Date: 04/23/2019 SLP Individual Time: 0930-1030 SLP Individual Time Calculation (min): 60 min  Short Term Goals: Week 2: SLP Short Term Goal 1 (Week 2): Patient will demosntrate selective attention to functional tasks for ~15 minutes with Min A verbal cues for redirction.  SLP Short Term Goal 2 (Week 2): Patient will demonstrate functional problem solving for basic and familiar tasks with Min A verbal cues.  SLP Short Term Goal 3 (Week 2): Patient will utilize external aids to maximize recall of functional information with Min A multmodal cues.  SLP Short Term Goal 4 (Week 2): Patient will identify 2 physical and 2 cognitive deficits with Mod A multimodal cues.  SLP Short Term Goal 5 (Week 2): Patient will consume current diet with minimal overt s/s of aspiration with Mod I  for use of swallowing strategies.  SLP Short Term Goal 5 - Progress (Week 2): Met  Skilled Therapeutic Interventions:  Skilled treatment session focused on cognition goals. SLP facilitated session by providing Min A cues to recall medication list as well as function of medicines. Pt able to complete previously learned card game with Min A cues. Additionally, pt able to demonstrate intellectual awareness with Mod A cues. He was not, however, able to provide appropriate solutions to hypothetical safety awareness situations. Pt left upright in wheelchair, lap belt alarm in place and all needs within reach. Continue per current plan of care.      Pain    Therapy/Group: Individual Therapy  Vikrant Pryce 04/23/2019, 10:50 AM

## 2019-04-23 NOTE — Progress Notes (Signed)
Occupational Therapy Session Note  Patient Details  Name: Benjamin Cohen MRN: [id number] Date of Birth: 1956/09/25  Today's Date: 04/23/2019 OT Individual Time: 1030-1130 OT Individual Time Calculation (min): 60 min    Short Term Goals: Week 2:  OT Short Term Goal 1 (Week 2): STG=LTG due to LOS  Skilled Therapeutic Interventions/Progress Updates:    Pt seen for OT session focusing on functional ambulation, safety, and awareness. Pt asleep in supine upon arrival, easily awoken and agreeable to tx session. He ambulated throughout room to gather clothing items and returned to EOB to dress demonstrating improved safety awareness as pt with tendency to attempt to dress from standing position. LB dressing completed with supervision. He ambulated to sink and completed oral care standing at sink with supervision, heavy reliance on UEs for support 2/2 fatigue.  Ambulated to therapy gym with supervision, 1 minimal LOB episode when making sharp, quick turn requiring min A to regain balance. Education/discussion at length with pt regarding CLOF vs. PLOF and insight into deficits. Pt able to name physical differences in himself such as endurance, but demonstrates little insight into his severe cognitive impairments and functional implications. Difficulty understanding abstract thoughts/scenerios presented by therapist in regards to his cognitive impairments. Competed safety flash-cards with pt, pt with 100% accuracy in identifying unsafe situations in pictures and able to verbalize what was unsafe and how to correct.  Dynamic standing task, tossing ball back and forth with therapist while naming [employer] teams. Slightly increased time required for naming, however, much improved from previous sessions. Pt returned to room at end of session, left sitting in w/c with all needs in reach, safety belt on and NT present.   Therapy Documentation Precautions:  Precautions Precautions: Fall Precaution Comments: High  risk for falls/telesitter Restrictions Weight Bearing Restrictions: No   Therapy/Group: Individual Therapy  Benjamin Cohen 04/23/2019, 12:36 PM

## 2019-04-23 NOTE — Progress Notes (Signed)
Physical Therapy Session Note  Patient Details  Name: Benjamin Cohen MRN: [id number] Date of Birth: 07-Apr-1956  Today's Date: 04/23/2019 PT Individual Time: 1330-1425 PT Individual Time Calculation (min): 55 min   Short Term Goals: Week 2:  PT Short Term Goal 1 (Week 2): =LTG due to ELOS  Skilled Therapeutic Interventions/Progress Updates:  Pt received supine in bed asleep, arousable and agreeable to PT session. No complaints of pain, does report feeling very fatigued. Bed mobility independent. Sit to stand with Supervision. Ambulation 2 x 150 ft with no AD and Supervision. Static standing balance: wii bowling and wii tilt table with CGA for standing balance, focus on anterior weight shift with bowling and multidirectional weight shift with tilt table game following visual cues. Pt fatigues quickly in standing. Pt requests to use the bathroom, Supervision for toilet transfer. Pt is setup A to change his shirt at end of session. Pt left seated in w/c in room with needs in reach, quick release belt and chair alarm in place, telesitter present.    Therapy Documentation Precautions:  Precautions Precautions: Fall Precaution Comments: High risk for falls/telesitter Restrictions Weight Bearing Restrictions: No    Therapy/Group: Individual Therapy   Peter Congo, PT, DPT  04/23/2019, 3:39 PM

## 2019-04-24 ENCOUNTER — Inpatient Hospital Stay (HOSPITAL_COMMUNITY): Payer: 59 | Admitting: Physical Therapy

## 2019-04-24 ENCOUNTER — Inpatient Hospital Stay (HOSPITAL_COMMUNITY): Payer: 59 | Admitting: Speech Pathology

## 2019-04-24 ENCOUNTER — Inpatient Hospital Stay (HOSPITAL_COMMUNITY): Payer: 59 | Admitting: Occupational Therapy

## 2019-04-24 LAB — GLUCOSE, CAPILLARY
Glucose-Capillary: 164 mg/dL — ABNORMAL HIGH (ref 70–99)
Glucose-Capillary: 141 mg/dL — ABNORMAL HIGH (ref 70–99)
Glucose-Capillary: 137 mg/dL — ABNORMAL HIGH (ref 70–99)
Glucose-Capillary: 198 mg/dL — ABNORMAL HIGH (ref 70–99)

## 2019-04-24 NOTE — Progress Notes (Signed)
Speech Language Pathology Daily Session Note  Patient Details  Name: Benjamin Cohen MRN: [id number] Date of Birth: Nov 23, 1956  Today's Date: 04/24/2019 SLP Individual Time: 6384-5364 SLP Individual Time Calculation (min): 45 min  Short Term Goals: Week 2: SLP Short Term Goal 1 (Week 2): Patient will demosntrate selective attention to functional tasks for ~15 minutes with Min A verbal cues for redirction.  SLP Short Term Goal 2 (Week 2): Patient will demonstrate functional problem solving for basic and familiar tasks with Min A verbal cues.  SLP Short Term Goal 3 (Week 2): Patient will utilize external aids to maximize recall of functional information with Min A multmodal cues.  SLP Short Term Goal 4 (Week 2): Patient will identify 2 physical and 2 cognitive deficits with Mod A multimodal cues.  SLP Short Term Goal 5 (Week 2): Patient will consume current diet with minimal overt s/s of aspiration with Mod I  for use of swallowing strategies.  SLP Short Term Goal 5 - Progress (Week 2): Met  Skilled Therapeutic Interventions: Pt was seen for skilled ST intervention targeting goals for improved attention, functional problem solving, recall, and awareness. Pt exhibited minimal recall of medication task completed yesterday in therapy, and required mod-max visual and verbal cues to read the information sheet explaining his medications. Pt had significant difficulty completing simple (level 1) mental math/money word problems, requiring mod verbal and visual cues to assist. Pt reports he does not plan to return to work, but will retire instead. He also indicated his son and ex wife would be able to assist with medication and financial management at DC.   Pain Pain Assessment Pain Scale: 0-10 Pain Score: 0-No pain  Therapy/Group: Individual Therapy   Benjamin Cohen B. Benjamin Cohen, Benjamin Cohen, Benjamin Cohen  Benjamin Cohen 04/24/2019, 2:42 PM

## 2019-04-24 NOTE — Progress Notes (Signed)
Occupational Therapy Session Note  Patient Details  Name: Benjamin Cohen MRN: [id number] Date of Birth: May 17, 1956  Today's Date: 04/24/2019 OT Individual Time: 0930-1030 OT Individual Time Calculation (min): 60 min    Short Term Goals: Week 2:  OT Short Term Goal 1 (Week 2): STG=LTG due to LOS  Skilled Therapeutic Interventions/Progress Updates:    Pt seen for OT session focusing on functional mobility and safety awareness. Pt sitting up in w/c upon arrival, singing along to music and agreeable to tx session, denying pain. He desired to work on "walking" today. Education/discussion regarding d/c planning and PLOF vs CLOF. Pt stating he has bills, car, etc he needs to take care of at d/c. Mod cuing for awareness to cognitive deficits and functional implications and his need for assist with IADLs and money/medication management at d/c. Pt with decreased awareness into his cognitive deficits, stating endurance as his most limiting factor. Education provided regarding current deficits and functional implications. He ambulated throughout session with supervision, no AD and occasional cuing for safety. Pt desiring to buy items from vending machine and had credit card to do so. He bought 3 items in total, however, required verbal instruction each time on process of buying item from vending machine with limited carry over btwn trials. Education provided regarding what to do in case of fall. Practiced floor transfer in simulation of fall with supervision.  In ADL apartment, completed home making task of sweeping floor with broom, completed with supervision. Discussed IADL tasks, pt stating he believed it would be safe for him to cook alone at d/c. Reviewed cognitive impairments as we discussed earlier in session with examples provided in regards to cooking task. Pt cont to believe it was safe for him to cook alone. Reviewed recommendation for supervision assist at d/c.  Table top newspaper activity from  seated position. Pt required to find various headlines in newspaper, requiring significantly increased time and min cuing for attention to task. Pt returned to room at end of session, left seated in w/c with all needs in reach and call bell in place with safety belt alarm donned.    Therapy Documentation Precautions:  Precautions Precautions: Fall Precaution Comments: High risk for falls/telesitter Restrictions Weight Bearing Restrictions: No   Therapy/Group: Individual Therapy  Yisroel Mullendore L 04/24/2019, 6:54 AM

## 2019-04-24 NOTE — Progress Notes (Signed)
Physical Therapy Session Note  Patient Details  Name: JHOSTIN DENAULT MRN: [id number] Date of Birth: [birth year]  Today's Date: 04/24/2019 PT Individual Time: 0815-0900 PT Individual Time Calculation (min): 45 min   Short Term Goals: Week 2:  PT Short Term Goal 1 (Week 2): =LTG due to ELOS  Skilled Therapeutic Interventions/Progress Updates:    Pt received supine in bed, agreeable to PT session. No complaints of pain. Bed mobility independent. Transfers with Supervision throughout session. Ambulation 2 x 150 ft with no AD and Supervision. Dynamic standing balance: agility ladder forwards step-through, lateral step-to, and diagonal steps with SBA for balance; sidesteps with cone taps with CGA, v/c for slow and controlled movements. Static standing balance: volleyball with 3# dowel rod. Floor transfer with CGA, v/c for safe transfer technique. Pt asking why he feels tired all the time and why he gets tired so quickly with activity. Education with patient about decreased endurance due to Covid affecting his respiratory system, being on a ventilator, etc. Pt receptive to education. Pt left seated in w/c in room with quick release belt and chair alarm in place at end of session.  Therapy Documentation Precautions:  Precautions Precautions: Fall Precaution Comments: High risk for falls/telesitter Restrictions Weight Bearing Restrictions: No    Therapy/Group: Individual Therapy   Peter Congo, PT, DPT  04/24/2019, 10:07 AM

## 2019-04-24 NOTE — Progress Notes (Signed)
[place] PHYSICAL MEDICINE & REHABILITATION PROGRESS NOTE  Subjective/Complaints: Up in bed. No new complaints  ROS: Patient denies fever, rash, sore throat, blurred vision, nausea, vomiting, diarrhea, cough, shortness of breath or chest pain, joint or back pain, headache, or mood change.   Objective: Vital Signs: Blood pressure (!) 140/100, pulse 82, temperature 98.1 F (36.7 C), temperature source Oral, resp. rate 17, height 5\' 7"  (1.702 m), weight 85.9 kg, SpO2 100 %. No results found. No results for input(s): WBC, HGB, HCT, PLT in the last 72 hours. No results for input(s): NA, K, CL, CO2, GLUCOSE, BUN, CREATININE, CALCIUM in the last 72 hours.  Physical Exam: BP (!) 140/100 (BP Location: Right Arm)   Pulse 82   Temp 98.1 F (36.7 C) (Oral)   Resp 17   Ht 5\' 7"  (1.702 m)   Wt 85.9 kg   SpO2 100%   BMI 29.66 kg/m  Constitutional: No distress . Vital signs reviewed. HEENT: EOMI, oral membranes moist Neck: supple Cardiovascular: RRR without murmur. No JVD    Respiratory: CTA Bilaterally without wheezes or rales. Normal effort    GI: BS +, non-tender, non-distended  Musc:  Improved Left shoulder ROM Neurological: Alert and oriented to person, month, place, reason Motor: 4/5. Fair coordination Psychiatric:generally pleasant,  Skin: left heel re-epithelializing--almost closed  Assessment/Plan: 1. Functional deficits secondary to debility which require 3+ hours per day of interdisciplinary therapy in a comprehensive inpatient rehab setting.  Physiatrist is providing close team supervision and 24 hour management of active medical problems listed below.  Physiatrist and rehab team continue to assess barriers to discharge/monitor patient progress toward functional and medical goals  Care Tool:  Bathing  Bathing activity did not occur: Refused Body parts bathed by patient: Face, Front perineal area, Buttocks, Right upper leg, Left upper leg, Right arm, Left arm, Right  lower leg, Chest, Abdomen, Left lower leg         Bathing assist Assist Level: Supervision/Verbal cueing     Upper Body Dressing/Undressing Upper body dressing   What is the patient wearing?: Pull over shirt    Upper body assist Assist Level: Independent    Lower Body Dressing/Undressing Lower body dressing      What is the patient wearing?: Pants, Underwear/pull up     Lower body assist Assist for lower body dressing: Supervision/Verbal cueing     Toileting Toileting    Toileting assist Assist for toileting: Contact Guard/Touching assist     Transfers Chair/bed transfer  Transfers assist     Chair/bed transfer assist level: Supervision/Verbal cueing     Locomotion Ambulation   Ambulation assist      Assist level: Supervision/Verbal cueing Assistive device: No Device Max distance: 150'   Walk 10 feet activity   Assist     Assist level: Supervision/Verbal cueing Assistive device: No Device   Walk 50 feet activity   Assist Walk 50 feet with 2 turns activity did not occur: Safety/medical concerns  Assist level: Supervision/Verbal cueing Assistive device: No Device    Walk 150 feet activity   Assist Walk 150 feet activity did not occur: Safety/medical concerns  Assist level: Supervision/Verbal cueing Assistive device: No Device    Walk 10 feet on uneven surface  activity   Assist Walk 10 feet on uneven surfaces activity did not occur: Safety/medical concerns         Wheelchair     Assist Will patient use wheelchair at discharge?: No Type of Wheelchair: Manual  Wheelchair assist level: Supervision/Verbal cueing Max wheelchair distance: 100'    Wheelchair 50 feet with 2 turns activity    Assist        Assist Level: Supervision/Verbal cueing   Wheelchair 150 feet activity     Assist Wheelchair 150 feet activity did not occur: Safety/medical concerns   Assist Level: Supervision/Verbal cueing       Medical Problem List and Plan: 1.Functional deficits and impaired mobilitysecondary to debility/encephalopathy after COVID 19 infection and subsequent respiratory failure/ARDS  --Continue CIR therapies including PT, OT, and SLP --   -continue contact precautions for MRSA  -ELOS 5/16 2. Antithrombotics: -DVT/anticoagulation:Pharmaceutical:Lovenox  -antiplatelet therapy: Plavix 3. Pain Management:tylenol prn  -pain improved  -voltaren gel  -ice prn    4. Mood:LCSW to follow for evaluation when appropriate. -antipsychotic agents: increased seroquel qhs to 50mg  for better sleep  -Seroquelprn 5. Neuropsych: This patientis notcapable of making decisions on hisown behalf.    -improving insight and awareness 6. Skin/Wound Care:  foam dressing only left heel, healing  -pressure relief when sitting/lying 7. Fluids/Electrolytes/Nutrition:Monitor I/O.   Encourage PO  -excellent intake 8. Metabolic encephalopathy/Delirium:  resolving -improved 9. ZOX:[id number]:Monitor BP bid --continue metoprolol for BP/HR control.  bp improved with metoprolol 50mg  bid--continue current regimen 10. H/o CVA: OnPlavix. 11. T2DM:Hgb A1c- 12.4 --has history of non-compliance per records.  -amaryl-increased to 2mg  with improvement    12. Dysphagia:On regular diet now and tolerating well.         LOS: 13 days A FACE TO FACE EVALUATION WAS PERFORMED  Ranelle OysterZachary T Jaysean Manville 04/24/2019, 9:30 AM

## 2019-04-24 NOTE — Progress Notes (Signed)
Occupational Therapy Session Note  Patient Details  Name: Benjamin Cohen MRN: [id number] Date of Birth: [birth year]  Today's Date: 04/24/2019 OT Individual Time: 6294-7654 OT Individual Time Calculation (min): 42 min    Short Term Goals: Week 2:  OT Short Term Goal 1 (Week 2): STG=LTG due to LOS  Skilled Therapeutic Interventions/Progress Updates:    Pt greeted supine in bed asleep, easy to wake and agreeable to OT treatment session. Pt needed verbal cues for safety awareness to sit to take off pants, then ambulated into bathroom with CGA. Pt completed bathing tasks with increased time, but overall supervision level. Dressing completed seated EOB with set-up A.  Worked on standing balance/endurance for standing grooming tasks at the sink with supervision. Pt needed increased time and frequent rest breaks within BADL tasks. Pt left seated EOB with bed alarm on awaiting next therapist.   Therapy Documentation Precautions:  Precautions Precautions: Fall Precaution Comments: High risk for falls/telesitter Restrictions Weight Bearing Restrictions: No Pain:  denies pain  Therapy/Group: Individual Therapy  [name] 04/24/2019, 7:43 AM

## 2019-04-25 ENCOUNTER — Inpatient Hospital Stay (HOSPITAL_COMMUNITY): Payer: 59 | Admitting: Occupational Therapy

## 2019-04-25 ENCOUNTER — Inpatient Hospital Stay (HOSPITAL_COMMUNITY): Payer: 59 | Admitting: Physical Therapy

## 2019-04-25 ENCOUNTER — Ambulatory Visit (HOSPITAL_COMMUNITY): Payer: 59 | Admitting: Speech Pathology

## 2019-04-25 LAB — GLUCOSE, CAPILLARY
Glucose-Capillary: 145 mg/dL — ABNORMAL HIGH (ref 70–99)
Glucose-Capillary: 105 mg/dL — ABNORMAL HIGH (ref 70–99)
Glucose-Capillary: 130 mg/dL — ABNORMAL HIGH (ref 70–99)
Glucose-Capillary: 183 mg/dL — ABNORMAL HIGH (ref 70–99)

## 2019-04-25 MED ORDER — METOPROLOL TARTRATE 50 MG PO TABS: 50.0000 mg | ORAL_TABLET | Freq: Two times a day (BID) | ORAL | 0 refills | Status: DC

## 2019-04-25 MED ORDER — QUETIAPINE FUMARATE 50 MG PO TABS: 50.0000 mg | ORAL_TABLET | Freq: Every day | ORAL | 0 refills | Status: DC

## 2019-04-25 MED ORDER — CLOPIDOGREL BISULFATE 75 MG PO TABS: 75.0000 mg | ORAL_TABLET | Freq: Every day | ORAL | 0 refills | Status: DC

## 2019-04-25 MED ORDER — DICLOFENAC SODIUM 1 % TD GEL: 2.0000 g | Freq: Three times a day (TID) | TRANSDERMAL | 1 refills | Status: DC

## 2019-04-25 MED ORDER — ATORVASTATIN CALCIUM 40 MG PO TABS: 40.0000 mg | ORAL_TABLET | Freq: Every day | ORAL | 0 refills | Status: DC

## 2019-04-25 MED ORDER — ACETAMINOPHEN 325 MG PO TABS: 325.0000 mg | ORAL_TABLET | ORAL | Status: DC | PRN

## 2019-04-25 MED ORDER — LIDOCAINE 5 % EX PTCH: 1.0000 | MEDICATED_PATCH | CUTANEOUS | 0 refills | Status: DC

## 2019-04-25 MED ORDER — GLIMEPIRIDE 2 MG PO TABS: 2.0000 mg | ORAL_TABLET | Freq: Every day | ORAL | 1 refills | Status: DC

## 2019-04-25 MED ORDER — BLOOD GLUCOSE METER KIT: PACK | 0 refills | Status: DC

## 2019-04-25 MED ORDER — AMLODIPINE BESYLATE 5 MG PO TABS: 5.0000 mg | ORAL_TABLET | Freq: Every day | ORAL | 0 refills | Status: DC

## 2019-04-25 NOTE — Progress Notes (Addendum)
[place] PHYSICAL MEDICINE & REHABILITATION PROGRESS NOTE  Subjective/Complaints: In bed. Comfortable. Anxious to get home  ROS: Patient denies fever, rash, sore throat, blurred vision, nausea, vomiting, diarrhea, cough, shortness of breath or chest pain, joint or back pain, headache, or mood change.    Objective: Vital Signs: Blood pressure 128/89, pulse 86, temperature 98.2 F (36.8 C), temperature source Oral, resp. rate 20, height 5\' 7"  (1.702 m), weight 85.9 kg, SpO2 100 %. No results found. No results for input(s): WBC, HGB, HCT, PLT in the last 72 hours. No results for input(s): NA, K, CL, CO2, GLUCOSE, BUN, CREATININE, CALCIUM in the last 72 hours.  Physical Exam: BP 128/89 (BP Location: Right Arm)   Pulse 86   Temp 98.2 F (36.8 C) (Oral)   Resp 20   Ht 5\' 7"  (1.702 m)   Wt 85.9 kg   SpO2 100%   BMI 29.66 kg/m  Constitutional: No distress . Vital signs reviewed. HEENT: EOMI, oral membranes moist Neck: supple Cardiovascular: RRR without murmur. No JVD    Respiratory: CTA Bilaterally without wheezes or rales. Normal effort    GI: BS +, non-tender, non-distended  Musc:  Improved Left shoulder ROM Neurological: Alert and oriented to person, month, place, reason Motor: 4/5. Fair coordination Psychiatric:generally pleasant,  Skin: left heel re-epithelializing--almost closed  Assessment/Plan: 1. Functional deficits secondary to debility which require 3+ hours per day of interdisciplinary therapy in a comprehensive inpatient rehab setting.  Physiatrist is providing close team supervision and 24 hour management of active medical problems listed below.  Physiatrist and rehab team continue to assess barriers to discharge/monitor patient progress toward functional and medical goals  Care Tool:  Bathing  Bathing activity did not occur: Refused Body parts bathed by patient: Face, Front perineal area, Buttocks, Right upper leg, Left upper leg, Right arm, Left arm, Right  lower leg, Chest, Abdomen, Left lower leg         Bathing assist Assist Level: Supervision/Verbal cueing     Upper Body Dressing/Undressing Upper body dressing   What is the patient wearing?: Pull over shirt    Upper body assist Assist Level: Independent    Lower Body Dressing/Undressing Lower body dressing      What is the patient wearing?: Pants, Underwear/pull up     Lower body assist Assist for lower body dressing: Supervision/Verbal cueing     Toileting Toileting    Toileting assist Assist for toileting: Contact Guard/Touching assist     Transfers Chair/bed transfer  Transfers assist     Chair/bed transfer assist level: Supervision/Verbal cueing     Locomotion Ambulation   Ambulation assist      Assist level: Supervision/Verbal cueing Assistive device: No Device Max distance: 150'   Walk 10 feet activity   Assist     Assist level: Supervision/Verbal cueing Assistive device: No Device   Walk 50 feet activity   Assist Walk 50 feet with 2 turns activity did not occur: Safety/medical concerns  Assist level: Supervision/Verbal cueing Assistive device: No Device    Walk 150 feet activity   Assist Walk 150 feet activity did not occur: Safety/medical concerns  Assist level: Supervision/Verbal cueing Assistive device: No Device    Walk 10 feet on uneven surface  activity   Assist Walk 10 feet on uneven surfaces activity did not occur: Safety/medical concerns         Wheelchair     Assist Will patient use wheelchair at discharge?: No Type of Wheelchair: Manual  Wheelchair assist level: Supervision/Verbal cueing Max wheelchair distance: 100'    Wheelchair 50 feet with 2 turns activity    Assist        Assist Level: Supervision/Verbal cueing   Wheelchair 150 feet activity     Assist Wheelchair 150 feet activity did not occur: Safety/medical concerns   Assist Level: Supervision/Verbal cueing       Medical Problem List and Plan: 1.Functional deficits and impaired mobilitysecondary to debility/encephalopathy after COVID 19 infection and subsequent respiratory failure/ARDS  --dc home tomorrow  -Patient to see Rehab MD/provider in the office for transitional care encounter in 2-4 weeks if pt is still in [place]. Otherwise needs to follow up with primary 2. Antithrombotics: -DVT/anticoagulation:Pharmaceutical:Lovenox  -antiplatelet therapy: Plavix 3. Pain Management:tylenol prn  -pain improved  -voltaren gel  -ice prn    4. Mood:LCSW to follow for evaluation when appropriate. -antipsychotic agents: increased seroquel qhs to 50mg  for better sleep  -Seroquelprn 5. Neuropsych: This patientis notcapable of making decisions on hisown behalf.    -improving insight and awareness but not quite to baseline 6. Skin/Wound Care:  foam dressing only left heel, healing  -pressure relief when sitting/lying 7. Fluids/Electrolytes/Nutrition:Monitor I/O.   Encourage PO  -excellent intake 8. Metabolic encephalopathy/Delirium:  resolving -improved 9. ZOX:WRUEAVWHTN:Monitor BP bid --continue metoprolol for BP/HR control.  bp improved with metoprolol 50mg  bid--continue current regimen at home, titrate as needed 10. H/o CVA: OnPlavix. 11. T2DM:Hgb A1c- 12.4 --has history of non-compliance per records.  -amaryl-increased to 2mg  with improvement---further titration as outpt    12. Dysphagia:On regular diet now and tolerating well.         LOS: 14 days A FACE TO FACE EVALUATION WAS PERFORMED  Ranelle OysterZachary T Yanissa Michalsky 04/25/2019, 9:36 AM

## 2019-04-25 NOTE — Progress Notes (Signed)
Speech Language Pathology Discharge Summary  Patient Details  Name: Benjamin Cohen MRN: [id number] Date of Birth: 03-04-1956  Today's Date: 04/25/2019 SLP Individual Time: 1330-1350 SLP Individual Time Calculation (min): 20 min   Skilled Therapeutic Interventions:  Skilled treatment session focused on cognition goals and caregiver education with pt's family. SLP facilitated session along with PT to most effectively demonstrate pt's cognitive deficits within functional movement. SLP facilitated by presenting 3 activities for pt to remember and problem solve how to anticipate decreased endurance. Pt very rapidly perform all tasks (see PT note) and as such he presented as impulsive and this placed pt at higher fall risk especially when transversing stairs. This Probation officer provided handout on compensatory memory strategies as well as education on need for 24 hour supervision during all tasks. Pt will require supervision with money/medication management as well as being cleared by MD prior to driving. Education also provided on current diet and general aspiration precautions such as decreasing distractions while eating and sitting upright. All questions answered to family's satisfaction.     Patient has met 8 of 8 long term goals.  Patient to discharge at overall Supervision;Min level.    Clinical Impression/Discharge Summary:   Pt has made good progress in skilled ST sessions and as a result he has met all of his LTGs. Pt currently requires Min A cues to complete basic problem solving tasks d/t deficits in attention, memory and awareness/insight into deficits. Education has been completed with pt's family. They understand need for 24 hour supervision, help with medication management and recommendation for HHST to further pt's cognitive/safety abilities.   Care Partner:  Caregiver Able to Provide Assistance: Yes  Type of Caregiver Assistance: Physical;Cognitive  Recommendation:  24 hour  supervision/assistance;Home Health SLP  Rationale for SLP Follow Up: Reduce caregiver burden;Maximize cognitive function and independence   Equipment:   N/A  Reasons for discharge: Treatment goals met;Discharged from hospital   Patient/Family Agrees with Progress Made and Goals Achieved: Yes    Khia Dieterich 04/25/2019, 2:08 PM

## 2019-04-25 NOTE — Progress Notes (Signed)
Physical Therapy Session Note  Patient Details  Name: Benjamin Cohen MRN: [id number] Date of Birth: Apr 13, 1956  Today's Date: 04/25/2019 PT Individual Time: 1914-7829 PT Individual Time Calculation (min): 54 min   Short Term Goals: Week 2:  PT Short Term Goal 1 (Week 2): =LTG due to ELOS  Skilled Therapeutic Interventions/Progress Updates:  Pt received in bed & agreeable to tx. Pt completes bed mobility with mod I & hospital bed features, sit<>Stand with mod I, and ambulates in room without AD & supervision. Pt performs hand hygiene standing at sink with supervision. Pt ambulates around unit without AD & supervision with decreased stride length & step length BLE. Pt completes Berg Balance Test & scores 47/56; educated pt on interpretation of score & current fall risk. Patient demonstrates increased fall risk as noted by score of 47/56 on Berg Balance Scale.  (<36= high risk for falls, close to 100%; 37-45 significant >80%; 46-51 moderate >50%; 52-55 lower >25%). Pt engaged in trampoline ball toss while standing on foam to challenge balance but pt required supervision with no LOB noted. Attempted to have pt assume quadruped position on mat table but pt unable 2/2 significant L shoulder pain so activity ended. Pt performed retrograde gait x 40 ft with supervision with task focusing on dynamic balance. Pt utilized cybex kinetron in standing with BUE support and supervision with cuing for upright posture and resistance up to 40 cm/sec x 3 bouts with max duration of 45 seconds with task focusing on BLE strengthening; pt requires seated rest break 2/2 BLE fatigue, denies SOB & SpO2 = 100% on room air. Pt utilized nu-step on level 3 x 10 minutes with BLE & RUE (pt elects not to use LUE 2/2 shoulder pain) with task focusing on global strengthening & endurance training. RN notified of pt's request for pain meds. Pt denies questions/concerns re d/c home tomorrow. Pt is able to pathfind back to room without cuing.  Pt left sitting in chair with alarm belt donned, call bell in reach, and telesitter in room.   Therapy Documentation Precautions:  Precautions Precautions: Fall Precaution Comments: High risk for falls/telesitter Restrictions Weight Bearing Restrictions: No  Balance: Balance Balance Assessed: Yes Standardized Balance Assessment Standardized Balance Assessment: Berg Balance Test Berg Balance Test Sit to Stand: Able to stand without using hands and stabilize independently Standing Unsupported: Able to stand safely 2 minutes Sitting with Back Unsupported but Feet Supported on Floor or Stool: Able to sit safely and securely 2 minutes Stand to Sit: Sits safely with minimal use of hands Transfers: Able to transfer safely, definite need of hands Standing Unsupported with Eyes Closed: Able to stand 10 seconds safely Standing Ubsupported with Feet Together: Able to place feet together independently and stand 1 minute safely From Standing, Reach Forward with Outstretched Arm: Can reach forward >12 cm safely (5") From Standing Position, Pick up Object from Floor: Able to pick up shoe safely and easily From Standing Position, Turn to Look Behind Over each Shoulder: Looks behind from both sides and weight shifts well Turn 360 Degrees: Able to turn 360 degrees safely one side only in 4 seconds or less Standing Unsupported, Alternately Place Feet on Step/Stool: Able to complete 4 steps without aid or supervision(completes 8 taps with supervision) Standing Unsupported, One Foot in Front: Able to plae foot ahead of the other independently and hold 30 seconds Standing on One Leg: Tries to lift leg/unable to hold 3 seconds but remains standing independently Total Score: 47  Therapy/Group: Individual Therapy  Sandi MariscalVictoria M  04/25/2019, 3:56 PM

## 2019-04-25 NOTE — Progress Notes (Signed)
Occupational Therapy Discharge Summary  Patient Details  Name: Benjamin Cohen MRN: [id number] Date of Birth: 1956-06-16   Patient has met 68 of 13 long term goals due to improved activity tolerance, improved balance, postural control, improved attention and improved awareness.  Patient to discharge at overall Supervision level.  Patient's care partner is independent to provide the necessary physical and cognitive assistance at discharge.  Pt to d/c home with his ex-wife. She has completed hands on family education and is aware of pt's severe cognitive impairments as well as decreased functional activity tolerance and functional implications. She voices and demonstrates willing and ableness to provide the needed care at d/c.  Pt performing ADLs and functional mobility at supervision level, no AD. Recommending tub transfer bench for use during bathing tasks at d/c due to high fall risk and decreased functional activity tolerance.    Recommendation:  Patient will benefit from ongoing skilled OT services in home health setting to continue to advance functional skills in the area of BADL, iADL and Reduce care partner burden.  Equipment: Tub transfer bench  Reasons for discharge: treatment goals met and discharge from hospital  Patient/family agrees with progress made and goals achieved: Yes  OT Discharge Precautions/Restrictions  Precautions Precautions: Fall Restrictions Weight Bearing Restrictions: No Vision Baseline Vision/History: No visual deficits Patient Visual Report: No change from baseline Vision Assessment?: No apparent visual deficits Perception  Perception: Within Functional Limits Praxis Praxis: Intact Cognition Overall Cognitive Status: Impaired/Different from baseline Arousal/Alertness: Awake/alert Orientation Level: Oriented X4 Attention: Sustained Sustained Attention: Impaired Sustained Attention Impairment: Verbal basic;Functional basic Memory: Impaired Memory  Impairment: Storage deficit;Decreased recall of new information;Decreased short term memory Decreased Short Term Memory: Verbal basic;Functional basic Awareness: Impaired Awareness Impairment: Intellectual impairment Problem Solving: Impaired Problem Solving Impairment: Verbal basic;Functional basic Behaviors: Impulsive;Restless Safety/Judgment: Impaired Comments: Decreased awareness of deficits and functional implications. Poor attention to task Sensation Sensation Light Touch: Appears Intact Proprioception: Appears Intact Coordination Gross Motor Movements are Fluid and Coordinated: Yes Fine Motor Movements are Fluid and Coordinated: Yes Motor  Motor Motor: Within Functional Limits Motor - Discharge Observations: ongoing generalized weakness Trunk/Postural Assessment  Cervical Assessment Cervical Assessment: Exceptions to WFL(Forard head) Thoracic Assessment Thoracic Assessment: Exceptions to WFL(Kyphotic; Rounded shoulders) Lumbar Assessment Lumbar Assessment: Exceptions to WFL(Posterior pelvic tilt) Postural Control Postural Control: Deficits on evaluation  Balance Balance Balance Assessed: Yes Dynamic Sitting Balance Dynamic Sitting - Balance Support: Feet supported;During functional activity;No upper extremity supported Dynamic Sitting - Level of Assistance: 7: Independent Static Standing Balance Static Standing - Balance Support: No upper extremity supported;During functional activity Static Standing - Level of Assistance: 7: Independent Dynamic Standing Balance Dynamic Standing - Balance Support: During functional activity;No upper extremity supported Dynamic Standing - Level of Assistance: 5: Stand by assistance;7: Independent Dynamic Standing - Comments: Standing to complete LB clothing management/toileting task Extremity/Trunk Assessment RUE Assessment RUE Assessment: Within Functional Limits General Strength Comments: generalized weakness/ deconditioning,  however, able to use in functional manner independently LUE Assessment LUE Assessment: Within Functional Limits General Strength Comments: generalized weakness/ deconditioning with reports of L shoulder pain at times limiting functional use   Jered Heiny L 04/25/2019, 6:54 AM

## 2019-04-25 NOTE — Discharge Instructions (Signed)
Inpatient Rehab Discharge Instructions  AUTHUR LAUGHEAD Discharge date and time: No discharge date for patient encounter.   Activities/Precautions/ Functional Status: Activity: activity as tolerated Diet: regular diet Wound Care: Routine skin checks Functional status:  ___ No restrictions     ___ Walk up steps independently ___ 24/7 supervision/assistance   ___ Walk up steps with assistance ___ Intermittent supervision/assistance  ___ Bathe/dress independently ___ Walk with walker     _x__ Bathe/dress with assistance ___ Walk Independently    ___ Shower independently ___ Walk with assistance    ___ Shower with assistance ___ No alcohol     ___ Return to work/school ________    COMMUNITY REFERRALS UPON DISCHARGE:    Home Health:   PT     OT     ST                      Agency:  [facility]    Phone:  [phone]     Special Instructions: No driving smoking or alcohol   My questions have been answered and I understand these instructions. I will adhere to these goals and the provided educational materials after my discharge from the hospital.  Patient/Caregiver Signature _______________________________ Date __________  Clinician Signature _______________________________________ Date __________  Please bring this form and your medication list with you to all your follow-up doctor's appointments.

## 2019-04-25 NOTE — Progress Notes (Signed)
Occupational Therapy Session Note  Patient Details  Name: Benjamin Cohen MRN: [id number] Date of Birth: Nov 24, 1956  Today's Date: 04/25/2019 OT Individual Time: 1120-1200 and 1300-1330 OT Individual Time Calculation (min): 40 min and 30 min   Short Term Goals: Week 2:  OT Short Term Goal 1 (Week 2): STG=LTG due to LOS  Skilled Therapeutic Interventions/Progress Updates:    Session One: Pt seen for OT ADL bathing/dressing session. Pt sitting up in w/c upon arrival, denied pain and agreeable to t session.  WIth encouragement, pt willing to take shower in prep for d/c home tomorrow. He ambulated throughout room with supervision, no AD. Gathered clothing items and needed supplies, reaching to floor and on overhead shelf to obtain needed items with supervision.  He bathed seated on tub transfer bench with supervision, min cuing required for sequencing of entire bathing/dressing routine and safety throughout.  He returned to standard chair to dress with supervision and periodic rest breaks required throughout due to decreased functional activity tolerance.  He ambulated within room to pick up dirty laundry items and place in laundry bag. Close supervision for dynamic balance and min cuing for attention to task. Pt remains impulsive with all mobility and poor safety awareness/awareness of deficits and functional implications. Pt left seated in standard chair at end of session, chair belt alarm on and all needs in reach.   Session Two: Pt seen for OT session focusing on caregiver training with pt's ex-wife who will be primary caregiver at d/c. Pt sitting up in w/c upon arrival, agreeable to tx session. While waiting for caregiver to arrive, reviewed with pt differences of CLOF vs PLOF and need for assist with ADLs/IADLs. Pt cont to demonstrate poor awareness into his cognitive deficits though he is aware of his decreased endurance. When caregivers arrived, education provided throughout session regarding  role of OT, OT/PT goals, pt's CLOF, need for 24hr assist and continuum of care.  Pt ambulated throughout session with supervision to ADL apartment. He was able to recall technique for tub/shower transfer utilizing tub transfer bench. Completed simulated transfer with supervision. Education provided regarding set-up of DME, energy conservation during bathing/dressing tasks and reducing risk of fall. Pt returned to therapy gym and left seated EOM with family present awaiting hand off to PT.   Therapy Documentation Precautions:  Precautions Precautions: Fall Precaution Comments: High risk for falls/telesitter Restrictions Weight Bearing Restrictions: No Pain:   No/denies pain   Therapy/Group: Individual Therapy  Coulton Schlink L 04/25/2019, 6:32 AM

## 2019-04-25 NOTE — Progress Notes (Signed)
Physical Therapy Discharge Summary  Patient Details  Name: Benjamin Cohen MRN: [id number] Date of Birth: 1956/07/08  Today's Date: 04/25/2019 PT Individual Time: 1000-1030;  PT Co-Treatment Time: 1330-1400 PT Individual Time Calculation (min): 30 min  PT Co-Treatment Time Calculation (min): 30 min   Patient has met 8 of 8 long term goals due to improved activity tolerance, improved balance, improved postural control, ability to compensate for deficits, improved attention and improved awareness.  Patient to discharge at an ambulatory level Supervision.   Patient's care partner is independent to provide the necessary cognitive assistance at discharge.  Reasons goals not met: Pt has met all rehab goals  Recommendation:  Patient will benefit from ongoing skilled PT services in home health setting to continue to advance safe functional mobility, address ongoing impairments in balance, endurance, safety awareness, and minimize fall risk.  Equipment: No equipment provided  Reasons for discharge: treatment goals met and discharge from hospital  Patient/family agrees with progress made and goals achieved: Yes   Skilled Intervention: Session 1: Pt received seated in w/c in room, agreeable to PT session. No complaints of pain. Pt transfers with Supervision throughout session. Ambulation x 200 ft with no AD and Supervision, cues for safety. Car transfer with Supervision. Ambulation up/down ramp and across uneven surface with Supervision. Pt left seated in w/c in room with needs in reach, quick release belt and chair alarm in place.  Session 2: Co-tx session with SLP. Pt received seated in therapy gym with his ex-wife and her fiance who are present for hands-on family education. Pt is at Supervision level for all mobility and transfers, continues to demonstrate decreased balance, endurance, and safety awareness. Pt is able to recall 3/3 tasks he is given to complete during therapy session (car  transfer, stairs, return to his room). Pt is impulsive with gait and requires v/c to slow down and perform stairs slowly and safely. Pt shows poor insight into his balance deficits and requires v/c to remain safe. Pt's family demonstrate good understanding of assist level that pt requires and that he will need 24/7 Supervision upon d/c home. Pt left seated in chair in room with quick release belt and chair alarm in place.   PT Discharge Precautions/Restrictions Precautions Precautions: Fall Restrictions Weight Bearing Restrictions: No Pain Pain Assessment Pain Scale: 0-10 Pain Score: 0-No pain Vision/Perception  Perception Perception: Within Functional Limits Praxis Praxis: Intact  Cognition Overall Cognitive Status: Impaired/Different from baseline Arousal/Alertness: Awake/alert Orientation Level: Oriented X4 Attention: Sustained Sustained Attention: Impaired Selective Attention: Impaired Selective Attention Impairment: Verbal basic;Functional basic Memory: Impaired Memory Impairment: Storage deficit;Decreased recall of new information;Decreased short term memory Decreased Short Term Memory: Verbal basic;Functional basic Awareness: Impaired Awareness Impairment: Intellectual impairment Problem Solving: Impaired Problem Solving Impairment: Verbal basic;Functional basic Behaviors: Impulsive;Restless Safety/Judgment: Impaired Comments: Decreased awareness of deficits and functional implications. Poor attention to task Sensation Sensation Light Touch: Appears Intact Proprioception: Appears Intact Coordination Gross Motor Movements are Fluid and Coordinated: Yes Fine Motor Movements are Fluid and Coordinated: Yes Motor  Motor Motor: Within Functional Limits Motor - Discharge Observations: ongoing generalized weakness  Mobility Bed Mobility Bed Mobility: Rolling Right;Rolling Left;Supine to Sit;Sit to Supine Rolling Right: Independent Rolling Left: Independent Supine to  Sit: Independent Sit to Supine: Independent Transfers Transfers: Sit to [employer] Transfers Sit to Stand: Supervision/Verbal cueing Stand to Sit: Supervision/Verbal cueing Stand Pivot Transfers: Supervision/Verbal cueing Stand Pivot Transfer Details: Verbal cues for safe use of DME/AE Locomotion  Gait Ambulation: Yes Gait Assistance: Supervision/Verbal  cueing Assistive device: None Gait Assistance Details: Verbal cues for safe use of DME/AE;Verbal cues for technique Gait Gait: Yes Gait Pattern: Within Functional Limits Gait Pattern: Ataxic Gait velocity: decreased Stairs / Additional Locomotion Stairs: Yes Stairs Assistance: Supervision/Verbal cueing Stair Management Technique: One rail Right;Alternating pattern Height of Stairs: 6 Wheelchair Mobility Wheelchair Mobility: No  Trunk/Postural Assessment  Cervical Assessment Cervical Assessment: Exceptions to WFL(forward head) Thoracic Assessment Thoracic Assessment: Exceptions to WFL(rounded shoulders) Lumbar Assessment Lumbar Assessment: Exceptions to WFL(posterior pelvic tilt) Postural Control Postural Control: Deficits on evaluation  Balance Balance Balance Assessed: Yes Dynamic Sitting Balance Dynamic Sitting - Balance Support: Feet supported;During functional activity;No upper extremity supported Dynamic Sitting - Level of Assistance: 7: Independent Static Standing Balance Static Standing - Balance Support: No upper extremity supported;During functional activity Static Standing - Level of Assistance: 7: Independent Dynamic Standing Balance Dynamic Standing - Balance Support: During functional activity;No upper extremity supported Dynamic Standing - Level of Assistance: 5: Stand by assistance;7: Independent Extremity Assessment   RLE Assessment RLE Assessment: Within Functional Limits Active Range of Motion (AROM) Comments: WFL General Strength Comments: 4/5 grossly LLE Assessment LLE Assessment: Within  Functional Limits Active Range of Motion (AROM) Comments: WFL General Strength Comments: 4/5 grossly     Excell Seltzer, PT, DPT 04/25/2019, 2:11 PM

## 2019-04-26 LAB — GLUCOSE, CAPILLARY: Glucose-Capillary: 144 mg/dL — ABNORMAL HIGH (ref 70–99)

## 2019-04-26 NOTE — Progress Notes (Signed)
Patient given discharge instructions and all questions answered. Patient was wheeled down in a wheel chair by the nurse with all his personal belongings. Lorri Frederick, LPN

## 2019-04-26 NOTE — Progress Notes (Signed)
Benjamin Cohen is a 63 y.o. male admitted for CIR with functional and mobility deficits secondary to debility and encephalopathy following COVID infection with ARDS  Subjective: No new complaints. No new problems. Slept well. Feeling OK and ready for anticipated discharge today  Objective: Vital signs in last 24 hours: Temp:  [97.9 F (36.6 C)-98.1 F (36.7 C)] 97.9 F (36.6 C) (05/16 0513) Pulse Rate:  [81-86] 81 (05/16 0513) Resp:  [18-20] 18 (05/15 2011) BP: (136-141)/(90-98) 141/98 (05/16 0513) SpO2:  [98 %-100 %] 100 % (05/16 0513) Weight:  [83.6 kg] 83.6 kg (05/16 0500) Weight change:  Last BM Date: 04/23/19  Intake/Output from previous day: 05/15 0701 - 05/16 0700 In: 840 [P.O.:840] Out: -  Last cbgs: CBG (last 3)  Recent Labs    04/25/19 1629 04/25/19 2104 04/26/19 0625  GLUCAP 130* 183* 144*     Physical Exam General: No apparent distress   HEENT: not dry Lungs: Normal effort. Lungs clear to auscultation, no crackles or wheezes. Cardiovascular: Regular rate and rhythm, no edema Abdomen: S/NT/ND; BS(+) Musculoskeletal:  unchanged Neurological: No new neurological deficits with some generalized weakness Wounds: N/A    Skin: clear   Mental state: Alert, oriented, cooperative    Lab Results: BMET    Component Value Date/Time   NA 138 04/21/2019 0717   NA 141 01/04/2015 1449   K 3.4 (L) 04/21/2019 0717   CL 100 04/21/2019 0717   CO2 25 04/21/2019 0717   GLUCOSE 195 (H) 04/21/2019 0717   BUN 13 04/21/2019 0717   BUN 9 01/04/2015 1449   CREATININE 1.02 04/21/2019 0717   CALCIUM 8.7 (L) 04/21/2019 0717   GFRNONAA >60 04/21/2019 0717   GFRAA >60 04/21/2019 0717   CBC    Component Value Date/Time   WBC 4.5 04/21/2019 0717   RBC 3.88 (L) 04/21/2019 0717   HGB 10.4 (L) 04/21/2019 0717   HCT 33.1 (L) 04/21/2019 0717   PLT 288 04/21/2019 0717   MCV 85.3 04/21/2019 0717   MCH 26.8 04/21/2019 0717   MCHC 31.4 04/21/2019 0717   RDW 14.1 04/21/2019  0717   LYMPHSABS 2.1 04/14/2019 0626   MONOABS 0.3 04/14/2019 0626   EOSABS 0.1 04/14/2019 0626   BASOSABS 0.0 04/14/2019 0626    Medications: I have reviewed the patient's current medications.  Assessment/Plan:  Functional deficits secondary to debility.  Improved and stable for anticipated discharge today Status post COVID-19/ARDS History of hypertension stable History of metabolic encephalopathy resolved Type 2 diabetes.  Stable  Length of stay, days: 15  Gordy Savers , MD 04/26/2019, 8:31 AM

## 2019-04-28 LAB — GLUCOSE, CAPILLARY: Glucose-Capillary: 101 mg/dL — ABNORMAL HIGH (ref 70–99)

## 2019-04-28 NOTE — Progress Notes (Signed)
Social Work  Discharge Note  The overall goal for the admission was met for:   Discharge location: Yes - pt's d/c'd home with ex-wife in [place], [place]  Length of Stay: Yes - 15 days  Discharge activity level: Yes - supervision  Home/community participation: Yes  Services provided included: MD, RD, PT, OT, SLP, RN, TR, Pharmacy and [place]: Medicare and Private Insurance: [employer] (primary)  Follow-up services arranged: Home Health: PT, OT, ST via [facility] [place] [place] branch), DME: tub bench via [place] and Patient/Family has no preference for HH/DME agencies   Also, referred pt to local physician in [place] who agreed to follow pt while there and sign off on [facility] orders:      Dr. Alphonzo Grieve @ [phone]  Comments (or additional information):       Contact info:  Best to call ex-wife, as pt with cognitive impairments.  Myrtha Mantis @ [phone]  Patient/Family verbalized understanding of follow-up arrangements: Yes  Individual responsible for coordination of the follow-up plan: pt/ son  Confirmed correct DME delivered: Lennart Pall 04/28/2019    Aurelio Mccamy, Lorre Nick

## 2019-04-28 NOTE — Discharge Summary (Signed)
Physician Discharge Summary  Patient ID: Benjamin Cohen MRN: [id number] DOB/AGE: 65/30/1957 65 y.o.  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 Nonobstructive bowel gas pattern. IMPRESSION: NG tube coils in the stomach with the tip in the fundus. Electronically Signed   By: Rolm Baptise M.D.   On: 04/04/2019 19:00   Dg Abd 1 View  Result Date: 04/01/2019 CLINICAL DATA:  NG tube placement. EXAM: ABDOMEN - 1 VIEW COMPARISON:  None. FINDINGS: Endotracheal tube loops over the gastric body with tip and side hole overlying the proximal stomach. Gas is present in nondilated loops of small and large bowel throughout the abdomen without evidence of obstruction. A catheter projects over the right atrium. No acute osseous abnormality is seen. IMPRESSION: Enteric tube in the proximal stomach. Electronically Signed   By: Logan Bores M.D.   On: 04/01/2019 16:44   Dg Chest Port 1 View  Result Date: 04/05/2019 CLINICAL DATA:  Shortness of breath, respiratory failure secondary to COVID-19 and status post extubation. EXAM: PORTABLE CHEST 1 VIEW COMPARISON:  03/30/2019 FINDINGS: Stable heart size. Nasogastric tube extends below the diaphragm into the stomach. Left-sided PICC line extends to the lower SVC. Lungs show improved aeration at both bases with some bibasilar atelectasis remaining as well as mild interstitial prominence in both lungs, left greater than right. No significant focal  airspace consolidation, pulmonary edema, pleural fluid or pneumothorax identified. IMPRESSION: Improved aeration at both lung bases. Mild interstitial prominence in both lungs, left greater than right. Electronically Signed   By: Aletta Edouard M.D.   On: 04/05/2019 15:58   Dg Abd Portable 1v  Result Date: 04/06/2019 CLINICAL DATA:  Assess feeding tube.  Code but prosody patient. EXAM: PORTABLE ABDOMEN - 1 VIEW COMPARISON:  April 05, 2019 FINDINGS: The NG tube terminates in the region of the gastric antrum. IMPRESSION: The NG tube terminates in the region of the gastric antrum. Electronically Signed   By: Dorise Bullion III M.D   On: 04/06/2019 07:49   Dg Abd Portable 1v  Result Date: 04/05/2019 CLINICAL DATA:  NG tube placement EXAM: PORTABLE ABDOMEN - 1 VIEW COMPARISON:  04/05/2019 FINDINGS: NG tube tip is in the mid to distal stomach. Nonobstructive bowel gas pattern. IMPRESSION: NG tube tip in the mid to distal stomach. Electronically Signed   By: Rolm Baptise M.D.   On: 04/05/2019 18:56   Dg Abd Portable 1v  Result Date: 04/05/2019 CLINICAL DATA:  63 year old male with history of enteric tube placement. EXAM: PORTABLE ABDOMEN - 1 VIEW COMPARISON:  Abdominal radiograph 04/04/2019. FINDINGS: Nasogastric tube coiled in the stomach. No pathologic dilatation of small bowel or colon. No gross pneumoperitoneum noted in the abdomen on today's supine view. IMPRESSION: 1. Nasogastric tube with tip in the stomach. 2. Nonobstructive bowel gas pattern.  No pneumoperitoneum. Electronically Signed   By: Vinnie Langton M.D.   On: 04/05/2019 15:55   Dg Swallowing Func-speech Pathology  Result Date: 04/18/2019 Objective Swallowing Evaluation: Type  of Study: MBS-Modified Barium Swallow Study  Patient Details Name: Benjamin Cohen MRN: [id number] Date of Birth: 1956/11/22 Today's Date: 04/18/2019 Time: SLP Start Time (ACUTE ONLY): 1415 -SLP Stop Time (ACUTE ONLY): 1500 SLP Time Calculation (min) (ACUTE ONLY): 45 min  Past Medical History: Past Medical History: Diagnosis Date . Diabetes mellitus without complication ([place])  . Hypertension  . Stroke [facility])  Past Surgical History: Past Surgical History: Procedure Laterality Date . NO PAST SURGERIES   HPI: 63 y/o M admitted 3/31 COVID (+).Developed worsening hypoxemia, respiratory distress 4/1 and PCCM consulted for evaluation. Intubated 4/2 with ARDS.Was extubated 03/23/2019, with no oxygen requirement. Initial clinical swallow evaluation 4/13 revealed concerns for severe dysphagia; NPO was recommended, TF.  Pt developed respiratory distress and was reintubated on 03/28/2019; extubated 4/20. Hx prior strokes.  Subjective: alert Assessment / Plan / Recommendation CHL IP CLINICAL IMPRESSIONS 04/18/2019 Clinical Impression Pt presents with mild oropharyngeal dysphagia c/b mild difficulty with bolus cohesion (especially when consuming whole barium tablet with thin liquids, applesauce aided in orally clearing pill). Despite this, pt able to effectively clear oral cavity with dysphagia 3 textures. Additionally, pt presents with swallow initiaiton at pyriform sinuses when consuming thin liquids. No penetration or aspiration were observed even with challenging boluses of thin liquids. Recommend dysphagia 3 with thin liquids,  medicine whole in puree and intermittent supervision.  SLP Visit Diagnosis Dysphagia, oropharyngeal phase (R13.12) Attention and concentration deficit following -- Frontal lobe and executive function deficit following -- Impact on safety and function Mild aspiration risk   CHL IP TREATMENT RECOMMENDATION 04/09/2019 Treatment Recommendations Therapy as outlined in treatment plan below   Prognosis 04/09/2019 Prognosis for Safe Diet Advancement Good Barriers to Reach Goals Cognitive deficits;Time post onset;Severity of deficits Barriers/Prognosis Comment -- CHL IP DIET RECOMMENDATION 04/18/2019 SLP Diet Recommendations Dysphagia 3 (Mech soft) solids;Thin liquid Liquid  Administration via Cup;Straw Medication Administration Whole meds with puree Compensations Minimize environmental distractions;Slow rate;Small sips/bites Postural Changes --   CHL IP OTHER RECOMMENDATIONS 04/09/2019 Recommended Consults -- Oral Care Recommendations Oral care BID Other Recommendations Order thickener from pharmacy;Prohibited food (jello, ice cream, thin soups);Remove water pitcher   CHL IP FOLLOW UP RECOMMENDATIONS 04/10/2019 Follow up Recommendations Inpatient Rehab   CHL IP FREQUENCY AND DURATION 04/09/2019 Speech Therapy Frequency (ACUTE ONLY) min 2x/week Treatment Duration 2 weeks      CHL IP ORAL PHASE 04/18/2019 Oral Phase Impaired Oral - Pudding Teaspoon -- Oral - Pudding Cup -- Oral - Honey Teaspoon -- Oral - Honey Cup -- Oral - Nectar Teaspoon NT Oral - Nectar Cup NT Oral - Nectar Straw NT Oral - Thin Teaspoon -- Oral - Thin Cup Premature spillage Oral - Thin Straw Premature spillage Oral - Puree NT Oral - Mech Soft Premature spillage Oral - Regular -- Oral - Multi-Consistency -- Oral - Pill Reduced posterior propulsion;Weak lingual manipulation;Delayed oral transit;Decreased bolus cohesion Oral Phase - Comment --  CHL IP PHARYNGEAL PHASE 04/18/2019 Pharyngeal Phase Impaired Pharyngeal- Pudding Teaspoon -- Pharyngeal -- Pharyngeal- Pudding Cup -- Pharyngeal -- Pharyngeal- Honey Teaspoon -- Pharyngeal -- Pharyngeal- Honey Cup -- Pharyngeal -- Pharyngeal- Nectar Teaspoon NT Pharyngeal -- Pharyngeal- Nectar Cup NT Pharyngeal -- Pharyngeal- Nectar Straw NT Pharyngeal -- Pharyngeal- Thin Teaspoon Delayed swallow initiation-pyriform sinuses Pharyngeal -- Pharyngeal- Thin Cup Delayed swallow initiation-pyriform sinuses Pharyngeal -- Pharyngeal- Thin Straw Delayed swallow initiation-pyriform sinuses Pharyngeal -- Pharyngeal- Puree Delayed swallow initiation-vallecula Pharyngeal -- Pharyngeal- Mechanical Soft Delayed swallow initiation-vallecula Pharyngeal -- Pharyngeal- Regular -- Pharyngeal -- Pharyngeal-  Multi-consistency --  Pharyngeal -- Pharyngeal- Pill Delayed swallow initiation-vallecula Pharyngeal -- Pharyngeal Comment --  CHL IP CERVICAL ESOPHAGEAL PHASE 04/18/2019 Cervical Esophageal Phase WFL Pudding Teaspoon -- Pudding Cup -- Honey Teaspoon -- Honey Cup -- Nectar Teaspoon -- Nectar Cup -- Nectar Straw -- Thin Teaspoon -- Thin Cup -- Thin Straw -- Puree -- Mechanical Soft -- Regular -- Multi-consistency -- Pill -- Cervical Esophageal Comment -- Benjamin Cohen 04/18/2019, 9:49 AM              Dg Swallowing Func-speech Pathology  Result Date: 04/09/2019 Objective Swallowing Evaluation: Type of Study: MBS-Modified Barium Swallow Study  Patient Details Name: Benjamin Cohen MRN: [id number] Date of Birth: [birth year] Today's Date: 04/09/2019 Time: SLP Start Time (ACUTE ONLY): 1448 -SLP Stop Time (ACUTE ONLY): 1235 SLP Time Calculation (min) (ACUTE ONLY): 50 min Past Medical History: Past Medical History: Diagnosis Date . Hypertension  . Stroke [facility])  Past Surgical History: Past Surgical History: Procedure Laterality Date . NO PAST SURGERIES   HPI: 63 y/o M admitted 3/31 COVID (+).Developed worsening hypoxemia, respiratory distress 4/1 and PCCM consulted for evaluation. Intubated 4/2 with ARDS.Was extubated 03/23/2019, with no oxygen requirement. Initial clinical swallow evaluation 4/13 revealed concerns for severe dysphagia; NPO was recommended, TF.  Pt developed respiratory distress and was reintubated on 03/28/2019; extubated 4/20. Hx prior strokes.  Subjective: alert Assessment / Plan / Recommendation CHL IP CLINICAL IMPRESSIONS 04/09/2019 Clinical Impression Pt has a mild-moderate oropharyngeal dysphagia that appears to have more of an underlying neurological etiology, as compared to most typical post-extubation dysphagias. He has lingual pumping and slow transit. Mild lingual residue is present. When drinking thin or thickened liquids via straw he does not form cohesive boluses, more so letting liquids spill  passively into the pharynx as he simlutaneously keeps sucking more liquid up via straw. Consequently, his pyriforms become more filled with liquids before the swallow, and he has small amounts of silent aspiration that comes posteriorly over the arytenoids. He forms a more cohesive bolus with cup sips of nectar thick liquids and solids, with smaller amounts of premature spillage. Hyolaryngeal movement, base of tongue retraction, and epiglottic inversion are reduced, so he has residue in his valleculae and pyriform sinuses, but this is reduced either with additional swallows (either Min cues for volitional ones or sometimes simply as he swallows the next bolus). Intermittent, trace amounts of penetration occurs with cup sips of nectar thick liquids, but this also clears well as he continues to swallow. Recommend starting Dys 2 (chopped) diet and nectar thick liquids (no straws). 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 SLP Visit Diagnosis Dysphagia, oropharyngeal phase (R13.12) Attention and concentration deficit following -- Frontal lobe and executive function deficit following -- Impact on safety and function Mild aspiration risk;Moderate aspiration risk   CHL IP TREATMENT RECOMMENDATION 04/09/2019 Treatment Recommendations Therapy as outlined in treatment plan below   Prognosis 04/09/2019 Prognosis for Safe Diet Advancement Good Barriers to Reach Goals Cognitive deficits;Time post onset;Severity of deficits Barriers/Prognosis Comment -- CHL IP DIET RECOMMENDATION 04/09/2019 SLP Diet Recommendations Dysphagia 2 (Fine chop) solids;Nectar thick liquid Liquid Administration via Cup;No straw Medication Administration Whole meds with puree Compensations Minimize environmental distractions;Slow rate;Small sips/bites;Multiple dry swallows  after each bite/sip Postural Changes Seated upright at 90 degrees   CHL IP OTHER RECOMMENDATIONS 04/09/2019 Recommended Consults --  Oral Care Recommendations Oral care BID Other Recommendations Order thickener from pharmacy;Prohibited food (jello, ice cream, thin soups);Remove water pitcher   CHL IP FOLLOW UP RECOMMENDATIONS 04/09/2019 Follow up Recommendations Inpatient Rehab   CHL IP FREQUENCY AND DURATION 04/09/2019 Speech Therapy Frequency (ACUTE ONLY) min 2x/week Treatment Duration 2 weeks      CHL IP ORAL PHASE 04/09/2019 Oral Phase Impaired Oral - Pudding Teaspoon -- Oral - Pudding Cup -- Oral - Honey Teaspoon -- Oral - Honey Cup -- Oral - Nectar Teaspoon Holding of bolus;Delayed oral transit;Lingual/palatal residue Oral - Nectar Cup Holding of bolus;Delayed oral transit;Lingual/palatal residue Oral - Nectar Straw Decreased bolus cohesion;Delayed oral transit Oral - Thin Teaspoon -- Oral - Thin Cup Holding of bolus;Premature spillage Oral - Thin Straw Delayed oral transit;Decreased bolus cohesion Oral - Puree Lingual pumping;Reduced posterior propulsion;Weak lingual manipulation;Lingual/palatal residue;Delayed oral transit Oral - Mech Soft Lingual pumping;Reduced posterior propulsion;Weak lingual manipulation;Lingual/palatal residue;Premature spillage;Delayed oral transit Oral - Regular -- Oral - Multi-Consistency -- Oral - Pill -- Oral Phase - Comment --  CHL IP PHARYNGEAL PHASE 04/09/2019 Pharyngeal Phase Impaired Pharyngeal- Pudding Teaspoon -- Pharyngeal -- Pharyngeal- Pudding Cup -- Pharyngeal -- Pharyngeal- Honey Teaspoon -- Pharyngeal -- Pharyngeal- Honey Cup -- Pharyngeal -- Pharyngeal- Nectar Teaspoon Delayed swallow initiation-pyriform sinuses;Reduced epiglottic inversion;Reduced anterior laryngeal mobility;Reduced laryngeal elevation;Reduced tongue base retraction;Pharyngeal residue - valleculae;Pharyngeal residue - pyriform Pharyngeal -- Pharyngeal- Nectar Cup Delayed swallow initiation-pyriform  sinuses;Reduced epiglottic inversion;Reduced anterior laryngeal mobility;Reduced laryngeal elevation;Reduced tongue base retraction;Pharyngeal residue - valleculae;Pharyngeal residue - pyriform Pharyngeal -- Pharyngeal- Nectar Straw Delayed swallow initiation-pyriform sinuses;Reduced epiglottic inversion;Reduced anterior laryngeal mobility;Reduced laryngeal elevation;Reduced tongue base retraction;Pharyngeal residue - valleculae;Pharyngeal residue - pyriform;Penetration/Aspiration during swallow Pharyngeal Material enters airway, passes BELOW cords without attempt by patient to eject out (silent aspiration) Pharyngeal- Thin Teaspoon -- Pharyngeal -- Pharyngeal- Thin Cup Delayed swallow initiation-pyriform sinuses;Reduced epiglottic inversion;Reduced anterior laryngeal mobility;Reduced laryngeal elevation;Reduced tongue base retraction;Pharyngeal residue - valleculae;Pharyngeal residue - pyriform;Penetration/Aspiration during swallow Pharyngeal Material enters airway, passes BELOW cords without attempt by patient to eject out (silent aspiration) Pharyngeal- Thin Straw Delayed swallow initiation-pyriform sinuses;Reduced epiglottic inversion;Reduced anterior laryngeal mobility;Reduced laryngeal elevation;Reduced tongue base retraction;Pharyngeal residue - valleculae;Pharyngeal residue - pyriform;Penetration/Aspiration during swallow Pharyngeal Material enters airway, remains ABOVE vocal cords and not ejected out Pharyngeal- Puree Delayed swallow initiation-pyriform sinuses;Reduced epiglottic inversion;Reduced anterior laryngeal mobility;Reduced laryngeal elevation;Reduced tongue base retraction;Pharyngeal residue - valleculae;Pharyngeal residue - pyriform Pharyngeal -- Pharyngeal- Mechanical Soft Delayed swallow initiation-pyriform sinuses;Reduced epiglottic inversion;Reduced anterior laryngeal mobility;Reduced laryngeal elevation;Reduced tongue base retraction;Pharyngeal residue - valleculae;Pharyngeal residue -  pyriform Pharyngeal -- Pharyngeal- Regular -- Pharyngeal -- Pharyngeal- Multi-consistency -- Pharyngeal -- Pharyngeal- Pill -- Pharyngeal -- Pharyngeal Comment --  CHL IP CERVICAL ESOPHAGEAL PHASE 04/09/2019 Cervical Esophageal Phase WFL Pudding Teaspoon -- Pudding Cup -- Honey Teaspoon -- Honey Cup -- Nectar Teaspoon -- Nectar Cup -- Nectar Straw -- Thin Teaspoon -- Thin Cup -- Thin Straw -- Puree -- Mechanical Soft -- Regular -- Multi-consistency -- Pill -- Cervical Esophageal Comment -- Benjamin Cohen 04/09/2019, 4:27 PM  Benjamin Cohen, M.A. CCC-SLP Acute Rehabilitation Services Pager [phone] Office [phone]              Labs:  Basic Metabolic Panel: BMP Latest Ref Rng & Units 04/21/2019 04/14/2019 04/11/2019  Glucose 70 - 99 mg/dL 195(H) 178(H) 157(H)  BUN 8 - 23 mg/dL 13 9 10   Creatinine 0.61 - 1.24 mg/dL 1.02 0.88 0.78  BUN/Creat Ratio 10 - 22 - - -  Sodium 135 - 145 mmol/L 138  140 139  Potassium 3.5 - 5.1 mmol/L 3.4(L) 3.6 3.6  Chloride 98 - 111 mmol/L 100 104 107  CO2 22 - 32 mmol/L 25 26 23   Calcium 8.9 - 10.3 mg/dL 8.7(L) 9.0 8.2(L)    CBC: CBC Latest Ref Rng & Units 04/21/2019 04/14/2019 04/08/2019  WBC 4.0 - 10.5 K/uL 4.5 4.0 4.8  Hemoglobin 13.0 - 17.0 g/dL 10.4(L) 10.9(L) 10.5(L)  Hematocrit 39.0 - 52.0 % 33.1(L) 34.1(L) 33.6(L)  Platelets 150 - 400 K/uL 288 458(H) 437(H)    CBG: Recent Labs  Lab 04/25/19 1152 04/25/19 1629 04/25/19 2104 04/26/19 0625 04/26/19 1211  GLUCAP 105* 130* 183* 144* 101*    Brief HPI:   RASAAN BROTHERTON is a 63 year old male who was admitted on 03/11/19 with fever, SOB and cough due to COVID 19. He required intubation due to ARDs and was treated with Plaquenil, Toclizamab, ceftriaxone and Zithromax. He was extubated on 4/12 bit developed increase WOB due to  HCAP and was reintubated on 4/17 and transferred to [facility].  He was treated with Diflucan and vancomycin and tolerated extubation by 4/20.  Hospital course has been significant for delirium,  dysphagia, poor safety awareness with encephalopathy. He has had been COVID negative test X 2.  Therapy has been ongoing and patient was noted to have decline in functional status as well as cognitive deficits with dysphagia.  CIR was recommended for follow-up therapy.    Hospital Course: AMORI COLOMB was admitted to rehab 04/11/2019 for inpatient therapies to consist of PT, ST and OT at least three hours five days a week. Past admission physiatrist, therapy team and rehab RN have worked together to provide customized collaborative inpatient rehab.  His cognition has slowly been improving.  Respiratory status has been stable and p.o. intake has been good.  Blood pressures have been monitored on twice daily basis and has been controlled with use of metoprolol twice daily.  Diabetes has been monitored with AC at bedtime basis and Amaryl was added and increased to 2 mg for tighter control.  Swallow function has improved and he was advanced to dysphagia 3 , thin liquids per MBS on 5/08.   Team has been providing ego support to help manage anxiety.  Seroquel was increased to 50 mg to help with sleep hygiene.  He has made steady progress during his rehab stay but continues to have issues with impulsivity and poor safety awareness therefore supervision is recommended at discharge.  He will continue to receive further follow-up home health PT, OT and speech therapy by [facility] after discharge he has been referred to local physician in [place] who has agreed to sign home health orders.  Rehab course: During patient's stay in rehab weekly team conferences were held to monitor patient's progress, set goals and discuss barriers to discharge. At admission, patient required min to mod assist with ADL tasks and min assist with mobility. He exhibited moderate to severe cognitive deficits with dysphagia requiring dysphagia 2,  Nectar liquids.  He  has had improvement in activity tolerance, balance,  postural control as well as ability to compensate for deficits.  He requires supervision with ADL tasks. He requires supervision with cues for transfers and to ambulate without assistive device. He requires supervision to min assist for cognitive task and memory.Marland Kitchen  He continues to have issues with impulsivity with poor safety awareness and supervision is recommended at discharge.  Hands-on family education completed with ex-wife  regarding all aspects  of safety and care.      Disposition:  home   Diet: Regular textures/Carb Modified.   Special Instructions: 1. Needs supervision at this time.   Discharge Instructions    Ambulatory referral to Physical Medicine Rehab   Complete by:  As directed    Moderate complexity follow up 1-2 weeks COVID/disability     Allergies as of 04/26/2019   No Known Allergies     Medication List    STOP taking these medications   ibuprofen 200 MG tablet Commonly known as:  ADVIL   metFORMIN 1000 MG tablet Commonly known as:  GLUCOPHAGE     TAKE these medications   acetaminophen 325 MG tablet Commonly known as:  TYLENOL Take 1-2 tablets (325-650 mg total) by mouth every 4 (four) hours as needed for mild pain.   amLODipine 5 MG tablet Commonly known as:  NORVASC Take 1 tablet (5 mg total) by mouth daily.   atorvastatin 40 MG tablet Commonly known as:  LIPITOR Take 1 tablet (40 mg total) by mouth daily at 6 PM.   blood glucose meter kit and supplies Dispense based on patient and insurance preference. Use up to four times daily as directed. (FOR ICD-10 E10.9, E11.9).   clopidogrel 75 MG tablet Commonly known as:  PLAVIX Take 1 tablet (75 mg total) by mouth daily.   diclofenac sodium 1 % Gel Commonly known as:  VOLTAREN Apply 2 g topically 3 (three) times daily.   glimepiride 2 MG tablet Commonly known as:  AMARYL Take 1 tablet (2 mg total) by mouth daily with breakfast. What changed:    medication strength  how much to take    lidocaine 5 % Commonly known as:  LIDODERM Place 1 patch onto the skin daily. Remove & Discard patch within 12 hours or as directed by MD   metoprolol tartrate 50 MG tablet Commonly known as:  LOPRESSOR Take 1 tablet (50 mg total) by mouth 2 (two) times daily.   QUEtiapine 50 MG tablet Commonly known as:  SEROQUEL Take 1 tablet (50 mg total) by mouth at bedtime.      Follow-up Information    Meredith Staggers, MD Follow up.   Specialty:  Physical Medicine and Rehabilitation Why:  As directed Contact information: [address] [place] [address] [phone]        Dr. Alphonzo Grieve Follow up on 05/02/2019.   Why:  @ 3:00 pm ("New Patient" appointment) Contact information: [facility] [address] [place] [address]  [phone]          Signed: Bary Leriche 04/30/2019, 1:46 PM

## 2019-04-29 ENCOUNTER — Telehealth: Payer: Self-pay | Admitting: Registered Nurse

## 2019-04-29 NOTE — Telephone Encounter (Signed)
Transitional Care call Transitional Questions answered by Ms. Lucrezia Europe: Ex-Wife  Patient name: Arlow Coachman DOB: 14-Apr-1956 1. Are you/is patient experiencing any problems since coming home? No a. Are there any questions regarding any aspect of care? No 2. Are there any questions regarding medications administration/dosing? No a. Are meds being taken as prescribed? yes b. "Patient should review meds with caller to confirm"  3. Have there been any falls? No 4. Has Home Health been to the house and/or have they contacted you? Scheduled with [facility] in [place] a. If not, have you tried to contact them? NA b. Can we help you contact them? No 5. Are bowels and bladder emptying properly? Yes a. Are there any unexpected incontinence issues? No b. If applicable, is patient following bowel/bladder programs? (                          ) 6. Any fevers, problems with breathing, unexpected pain? No 7. Are there any skin problems or new areas of breakdown? No 8. Has the patient/family member arranged specialty MD follow up (ie cardiology/neurology/renal/surgical/etc.)?  Dr. Theador Hawthorne  Scheduled for HFU appointment.  9. Can we help arrange? NA  10. Does the patient need any other services or support that we can help arrange? No 11. Are caregivers following through as expected in assisting the patient? Yes 12. Has the patient quit smoking, drinking alcohol, or using drugs as recommended? Ms. Manson Passey states Mr. Kifer doesn't smoke, drink alcohol or use illicit drugs.   Moved to [place], will be followed by Dr. Harrie Foreman

## 2019-05-08 ENCOUNTER — Encounter: Payer: 59 | Admitting: Registered Nurse

## 2019-05-30 ENCOUNTER — Encounter: Payer: Self-pay | Admitting: Registered Nurse

## 2019-05-30 ENCOUNTER — Encounter: Payer: Self-pay | Attending: Registered Nurse | Admitting: Registered Nurse

## 2019-05-30 ENCOUNTER — Other Ambulatory Visit: Payer: Self-pay

## 2019-05-30 VITALS — BP 137/96 | HR 105 | Temp 97.6°F | Resp 16 | Ht 67.0 in | Wt 208.0 lb

## 2019-05-30 DIAGNOSIS — Z8673 Personal history of transient ischemic attack (TIA), and cerebral infarction without residual deficits: Secondary | ICD-10-CM | POA: Insufficient documentation

## 2019-05-30 DIAGNOSIS — I10 Essential (primary) hypertension: Secondary | ICD-10-CM | POA: Insufficient documentation

## 2019-05-30 DIAGNOSIS — R5381 Other malaise: Secondary | ICD-10-CM | POA: Insufficient documentation

## 2019-05-30 DIAGNOSIS — E119 Type 2 diabetes mellitus without complications: Secondary | ICD-10-CM | POA: Insufficient documentation

## 2019-05-30 NOTE — Progress Notes (Signed)
Subjective:    Patient ID: Benjamin Cohen, male    DOB: 04/19/1956, 63 y.o.   MRN: [id number]  HPI: Benjamin Cohen is a 63 y.o. male who is here for hospital follow up for his debility, hypertension, type 2 diabetes mellitus, pneumonia and ARDS secondary to COVID-19 and history of stroke. He was admitted on 03/11/2019 with fever, SOB and cough due to COVID 19. He developed worsening hypoxemia with respiratory distress. He required intubation due to ARDS and extubated on 03/23/2019. He was re-intubated on 03/28/2019 and transferred to [facility].  He was treated with antibiotics and extubated.   Chest X-ray: IMPRESSION: Potential developing nodular airspace opacities within the bilateral upper lungs, potentially artifactual due to technique though developing infection could have a similar appearance. Clinical correlation is advised. Further evaluation with a PA and lateral chest radiograph may be obtained as clinically indicated.  He was admitted to inpatient rehabilitation on 04/11/2019 and discharged to ex- wife home in [place], [place]. He received Home Health from [facility] in [place], [place].  He states he has no pain. He did not rate his pain. His current exercise regime is walking.    Pain Inventory Average Pain 9 Pain Right Now n/a My pain is n/a  In the last 24 hours, has pain interfered with the following? General activity n/a Relation with others n/a Enjoyment of life n/a What TIME of day is your pain at its worst? other Sleep (in general) n/a  Pain is worse with: n/a Pain improves with: n/a Relief from Meds: n/a  Mobility N/a   Function n/a  Neuro/Psych n/a  Prior Studies n/a  Physicians involved in your care n/a   Family History  Problem Relation Age of Onset  . Cancer Mother   . Hypertension Brother    Social History   Socioeconomic History  . Marital status: Single    Spouse name: Not on file  . Number of children: 3  . Years of  education: 12th  . Highest education level: Not on file  Occupational History  . Occupation: N/A    Employer: [employer]  . Financial resource strain: Not on file  . Food insecurity    Worry: Not on file    Inability: Not on file  . Transportation needs    Medical: Not on file    Non-medical: Not on file  Tobacco Use  . Smoking status: Never Smoker  . Smokeless tobacco: Never Used  Substance and Sexual Activity  . Alcohol use: No    Alcohol/week: 0.0 standard drinks  . Drug use: No  . Sexual activity: Yes  Lifestyle  . Physical activity    Days per week: Not on file    Minutes per session: Not on file  . Stress: Not on file  Relationships  . Social Herbalist on phone: Not on file    Gets together: Not on file    Attends religious service: Not on file    Active member of club or organization: Not on file    Attends meetings of clubs or organizations: Not on file    Relationship status: Not on file  Other Topics Concern  . Not on file  Social History Narrative   Patient is single with 3 children.   Patient is right handed.   Patient has hs education.    Patient drinks 2 cups weekly.   Past Surgical History:  Procedure Laterality Date  .  NO PAST SURGERIES     Past Medical History:  Diagnosis Date  . Diabetes mellitus without complication (HCC)   . Hypertension   . Stroke (HCC)    Temp 97.6 F (36.4 C)   Ht 5\' 7"  (1.702 m)   Wt 208 lb (94.3 kg)   BMI 32.58 kg/m   Opioid Risk Score:   Fall Risk Score:  `1  Depression screen PHQ 2/9  No flowsheet data found.   Review of Systems     Objective:   Physical Exam Vitals signs and nursing note reviewed.  Constitutional:      Appearance: Normal appearance.  Neck:     Musculoskeletal: Normal range of motion and neck supple.  Cardiovascular:     Rate and Rhythm: Normal rate and regular rhythm.     Pulses: Normal pulses.     Heart sounds: Normal heart sounds.  Pulmonary:      Effort: Pulmonary effort is normal.     Breath sounds: Normal breath sounds.  Musculoskeletal:     Comments: Normal Muscle Bulk and Muscle Testing Reveals:  Upper Extremities: Full ROM and Muscle Strength 5/5 Lower Extremities: Full ROM and Muscle Strength 5/5 Arises from Chair with Ease Narrow Based  Gait   Skin:    General: Skin is warm and dry.  Neurological:     Mental Status: He is alert and oriented to person, place, and time.  Psychiatric:        Mood and Affect: Mood normal.        Behavior: Behavior normal.           Assessment & Plan:  1. Debility/ Pneumonia and ARDS Secondary to COVID-19. Continue HEP and PCP Following. Continue to Monitor.  2. Hypertension: Continue current medication Regimen. PCP Following. Pharmacy was called and Medication Profile Updated.  3. Type @ DM: PCP Following. Continue to Monitor.  4. History of Stroke: PCP Following. Continue to Monitor.   20 minutes of face to face patient care time was spent during this visit. All questions were encouraged and answered.  F? U in 4- 6 weeks with Dr Riley KillSwartz.

## 2019-06-27 ENCOUNTER — Other Ambulatory Visit: Payer: Self-pay

## 2019-06-27 ENCOUNTER — Encounter (HOSPITAL_COMMUNITY): Payer: Self-pay

## 2019-06-27 ENCOUNTER — Ambulatory Visit (HOSPITAL_COMMUNITY)
Admission: EM | Admit: 2019-06-27 | Discharge: 2019-06-27 | Disposition: A | Discharge status: Home / Self Care | Payer: Medicare Other

## 2019-06-27 DIAGNOSIS — Z760 Encounter for issue of repeat prescription: Secondary | ICD-10-CM

## 2019-06-27 DIAGNOSIS — I10 Essential (primary) hypertension: Secondary | ICD-10-CM

## 2019-06-27 NOTE — ED Triage Notes (Signed)
Patient presents to Urgent Care with complaints of requesting medication refills, having his blood pressure, and blood sugar checked. Patient reports he has a PCP but did not go today, just ran out of medicines today.

## 2019-06-27 NOTE — Discharge Instructions (Addendum)
Your medication refills are ready for [facility] to pick up at [facility].    Your blood pressure was elevated today at 167/109.  Please follow-up with your primary care provider in 1 week to have this rechecked.

## 2019-06-27 NOTE — ED Provider Notes (Signed)
[place]    CSN: [id number] Arrival date & time: 06/27/19  1617     History   Chief Complaint Chief Complaint  Patient presents with  . Medication Refill    HPI NAVY BELAY is a 63 y.o. male.   Patient presents today [place] for medication refills on all of his medications.  He denies any symptoms, including dizziness, headache, chest pain, palpitations, shortness of breath, abdominal pain, nausea, vomiting, fever, chills.  The history is provided by the patient.    Past Medical History:  Diagnosis Date  . Diabetes mellitus without complication ([place])   . Hypertension   . Stroke [facility])     Patient Active Problem List   Diagnosis Date Noted  . Diabetes mellitus type 2 in nonobese (HCC)   . Essential hypertension   . Metabolic encephalopathy   . 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 Debility 04/11/2019  . Staphylococcal pneumonia ([place])   . Dysphagia   . Pressure injury of skin 03/23/2019  . Acute respiratory failure with hypoxia ([place]) 03/11/2019  . Noncompliance   . History of stroke   . Cerebral thrombosis with cerebral infarction 04/29/2018  . CVA (cerebral vascular accident) ([place]) 04/29/2018  . HLD (hyperlipidemia) 01/04/2015  . Diabetes ([place]) 11/14/2013  . Cerebral infarction ([place]) 11/12/2013  . Hypertensive urgency 11/12/2013    Past Surgical History:  Procedure Laterality Date  . NO PAST SURGERIES         Home Medications    Prior to Admission medications   Medication Sig Start Date End Date Taking? Authorizing Provider  acetaminophen (TYLENOL) 325 MG tablet Take 1-2 tablets (325-650 mg total) by mouth every 4 (four) hours as needed for mild pain. 04/25/19   Angiulli, Lavon Paganini, PA-C  amLODipine (NORVASC) 5 MG tablet Take 1 tablet (5 mg total) by mouth daily. 04/25/19   Angiulli, Lavon Paganini, PA-C  atorvastatin (LIPITOR) 40 MG tablet Take 1 tablet (40 mg total) by mouth daily at 6 PM. 04/25/19   Angiulli, Lavon Paganini,  PA-C  blood glucose meter kit and supplies Dispense based on patient and insurance preference. Use up to four times daily as directed. (FOR ICD-10 E10.9, E11.9). 04/25/19   Angiulli, Lavon Paganini, PA-C  clopidogrel (PLAVIX) 75 MG tablet Take 1 tablet (75 mg total) by mouth daily. 04/25/19   Angiulli, Lavon Paganini, PA-C  diclofenac sodium (VOLTAREN) 1 % GEL Apply 2 g topically 3 (three) times daily. 04/25/19   Angiulli, Lavon Paganini, PA-C  glimepiride (AMARYL) 2 MG tablet Take 1 tablet (2 mg total) by mouth daily with breakfast. 04/25/19   Angiulli, Lavon Paganini, PA-C  lidocaine (LIDODERM) 5 % Place 1 patch onto the skin daily. Remove & Discard patch within 12 hours or as directed by MD 04/25/19   [place], Lavon Paganini, PA-C  metoprolol tartrate (LOPRESSOR) 50 MG tablet Take 1 tablet (50 mg total) by mouth 2 (two) times daily. 04/25/19   Angiulli, Lavon Paganini, PA-C  QUEtiapine (SEROQUEL) 50 MG tablet Take 1 tablet (50 mg total) by mouth at bedtime. 04/25/19   Angiulli, Lavon Paganini, PA-C    Family History Family History  Problem Relation Age of Onset  . Cancer Mother   . Healthy Father   . Hypertension Brother     Social History Social History   Tobacco Use  . Smoking status: Never Smoker  . Smokeless tobacco: Never Used  Substance Use Topics  . Alcohol use: No    Alcohol/week:  0.0 standard drinks  . Drug use: No     Allergies   Patient has no known allergies.   Review of Systems Review of Systems  Constitutional: Negative for chills and fever.  HENT: Negative for ear pain and sore throat.   Eyes: Negative for pain and visual disturbance.  Respiratory: Negative for cough and shortness of breath.   Cardiovascular: Negative for chest pain and palpitations.  Gastrointestinal: Negative for abdominal pain and vomiting.  Genitourinary: Negative for dysuria and hematuria.  Musculoskeletal: Negative for arthralgias and back pain.  Skin: Negative for color change and rash.  Neurological: Negative for dizziness,  seizures, syncope, weakness and headaches.  All other systems reviewed and are negative.    Physical Exam Triage Vital Signs ED Triage Vitals  Enc Vitals Group     BP 06/27/19 1713 (!) 167/109     Pulse Rate 06/27/19 1713 (!) 110     Resp 06/27/19 1713 16     Temp 06/27/19 1713 97.7 F (36.5 C)     Temp Source 06/27/19 1713 Temporal     SpO2 06/27/19 1713 100 %     Weight --      Height --      Head Circumference --      Peak Flow --      Pain Score 06/27/19 1710 0     Pain Loc --      Pain Edu? --      Excl. in [place]? --    No data found.  Updated Vital Signs BP (!) 167/109 (BP Location: Left Arm)   Pulse (!) 110   Temp 97.7 F (36.5 C) (Temporal)   Resp 16   SpO2 100%   Visual Acuity Right Eye Distance:   Left Eye Distance:   Bilateral Distance:    Right Eye Near:   Left Eye Near:    Bilateral Near:     Physical Exam Vitals signs and nursing note reviewed.  Constitutional:      Appearance: He is well-developed.     Comments: Well-appearing.  HENT:     Head: Normocephalic and atraumatic.  Eyes:     Conjunctiva/sclera: Conjunctivae normal.  Neck:     Musculoskeletal: Neck supple.  Cardiovascular:     Rate and Rhythm: Normal rate and regular rhythm.     Heart sounds: No murmur.  Pulmonary:     Effort: Pulmonary effort is normal. No respiratory distress.     Breath sounds: Normal breath sounds.  Abdominal:     Palpations: Abdomen is soft.     Tenderness: There is no abdominal tenderness.  Skin:    General: Skin is warm and dry.  Neurological:     General: No focal deficit present.     Mental Status: He is alert.     Cranial Nerves: No cranial nerve deficit.     Sensory: No sensory deficit.     Motor: No weakness.     Coordination: Coordination normal.     Gait: Gait normal.     Deep Tendon Reflexes: Reflexes normal.      UC Treatments / Results  Labs (all labs ordered are listed, but only abnormal results are displayed) Labs Reviewed - No  data to display  EKG   Radiology No results found.  Procedures Procedures (including critical care time)  Medications Ordered in UC Medications - No data to display  Initial Impression / Assessment and Plan / UC Course  I have reviewed the triage vital signs  and the nursing notes.  Pertinent labs & imaging results that were available during my care of the patient were reviewed by me and considered in my medical decision making (see chart for details).   Medication refill.  Elevated blood pressure with known hypertension.  [facility] confirmed that patient has refills on all of his medications and they are ready for pickup.  Discussed with patient that he is blood pressure is elevated today and that he needs to take his blood pressure medications.  Discussed with patient that he should return here or go to the emergency department if he develops dizziness, headache, shortness of breath, chest pain, palpitations, weakness, fever, chills, other concerning symptoms.     Final Clinical Impressions(s) / UC Diagnoses   Final diagnoses:  Medication refill  Elevated blood pressure reading in office with diagnosis of hypertension     Discharge Instructions     Your medication refills [facility]    Your blood pressure was elevated today at 167/109.  Please follow-up with your primary care provider in 1 week to have this rechecked.       ED Prescriptions    None     Controlled Substance Prescriptions [place] Controlled Substance Registry consulted? Not Applicable   Sharion Balloon, NP 06/27/19 1756

## 2019-08-06 ENCOUNTER — Encounter: Payer: Self-pay | Attending: Registered Nurse | Admitting: Physical Medicine & Rehabilitation

## 2019-08-06 DIAGNOSIS — R5381 Other malaise: Secondary | ICD-10-CM | POA: Insufficient documentation

## 2019-08-06 DIAGNOSIS — E119 Type 2 diabetes mellitus without complications: Secondary | ICD-10-CM | POA: Insufficient documentation

## 2019-08-06 DIAGNOSIS — I10 Essential (primary) hypertension: Secondary | ICD-10-CM | POA: Insufficient documentation

## 2019-08-06 DIAGNOSIS — Z8673 Personal history of transient ischemic attack (TIA), and cerebral infarction without residual deficits: Secondary | ICD-10-CM | POA: Insufficient documentation

## 2020-11-30 IMAGING — DX PORTABLE CHEST - 1 VIEW
2 series · 2 of 2 positions shown · non-contrast
Comparison: Chest x-ray earlier same day [DATE] a.m. and previously.

CLINICAL DATA: 63-year-old with confirmed diagnosis of [id number].
Acute respiratory distress requiring intubation.

EXAM:
PORTABLE CHEST 1 VIEW [DATE] a.m.:

[chest ap (1 of 2)]
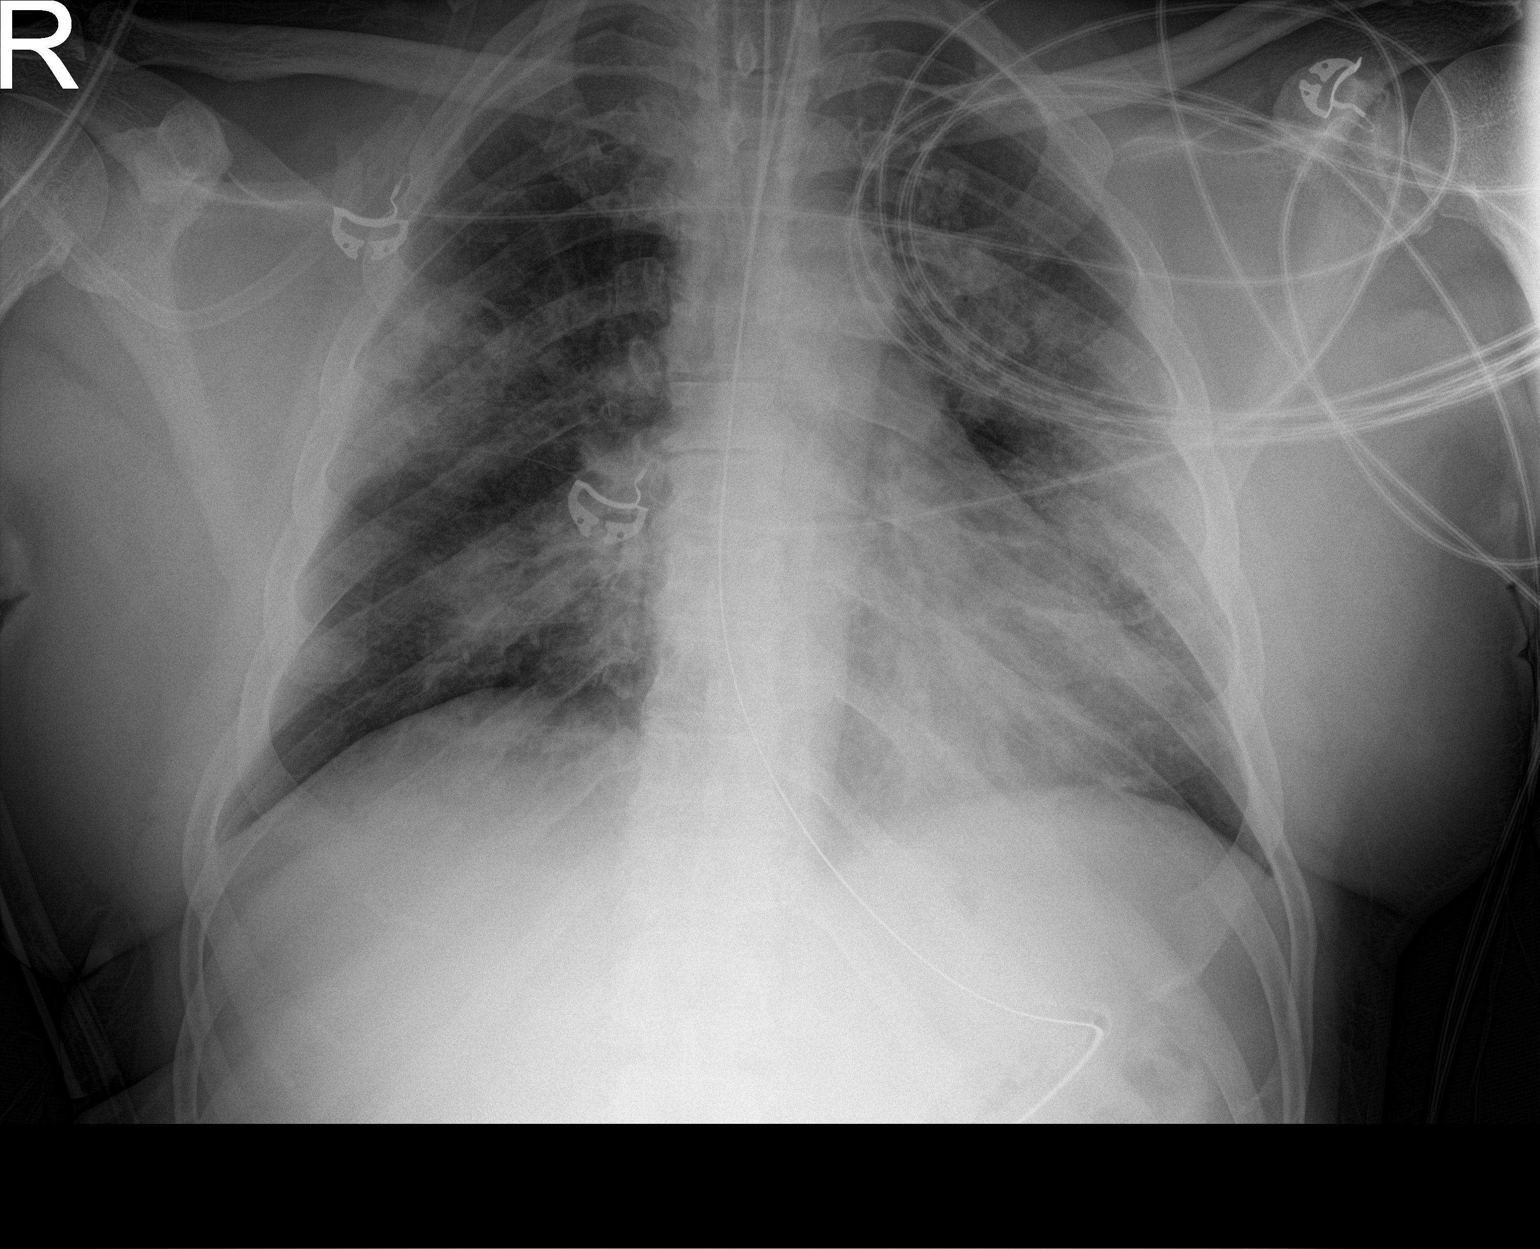

[chest ap (2 of 2)]
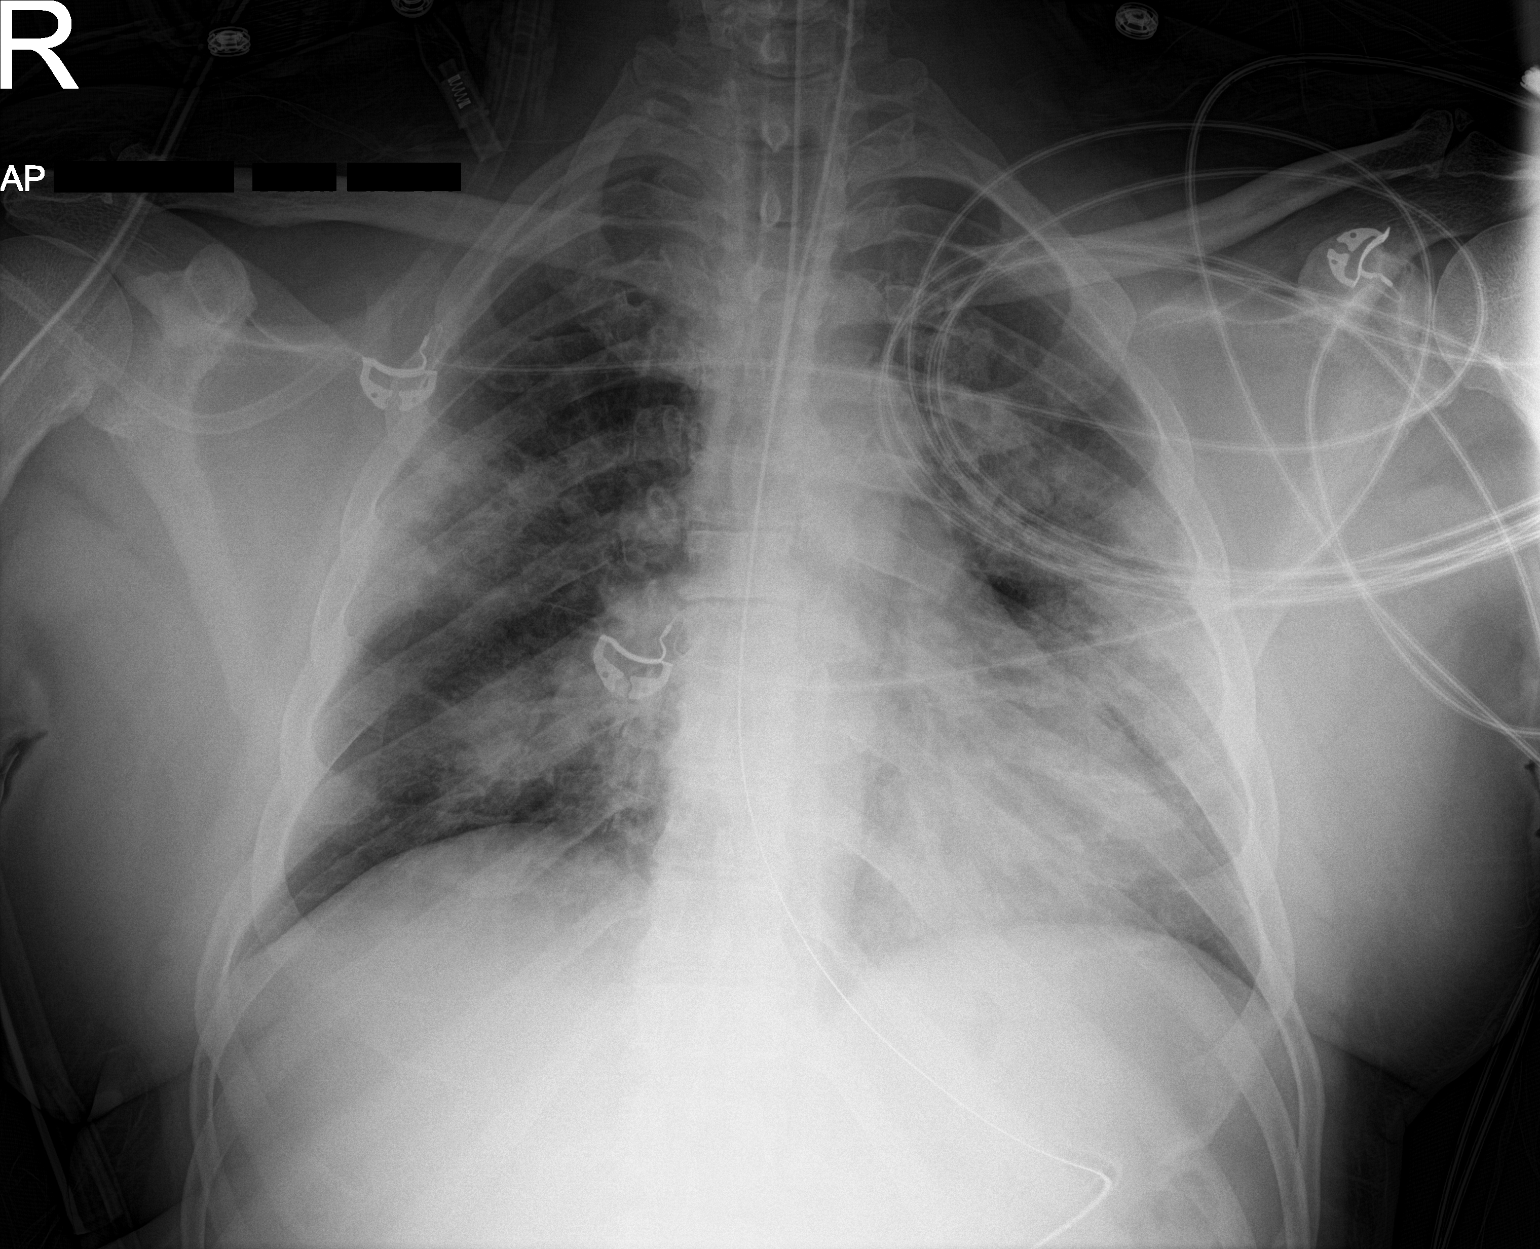

[2 of 2 positions shown; findings below may reference images not displayed]

FINDINGS: Endotracheal tube tip in satisfactory position projecting
approximately 5 cm above the carina. Gastric tube tip in the distal
stomach. Improved aeration in both lungs post intubation, with
patchy airspace opacities scattered throughout both lungs. No
visible pleural effusions.
IMPRESSION: 1. Endotracheal tube tip in satisfactory position projecting
approximately 5 cm above the carina.
2. Gastric tube tip in the distal stomach.
3. Improved aeration in both lungs post intubation, with patchy
pneumonia scattered throughout both lungs.

## 2020-12-12 IMAGING — DX PORTABLE ABDOMEN - 1 VIEW
1 series · 1 of 1 positions shown · non-contrast
Comparison: March 25, 2019

CLINICAL DATA: Evaluate feeding tube.

EXAM:
PORTABLE ABDOMEN - 1 VIEW

[abdomen kub]
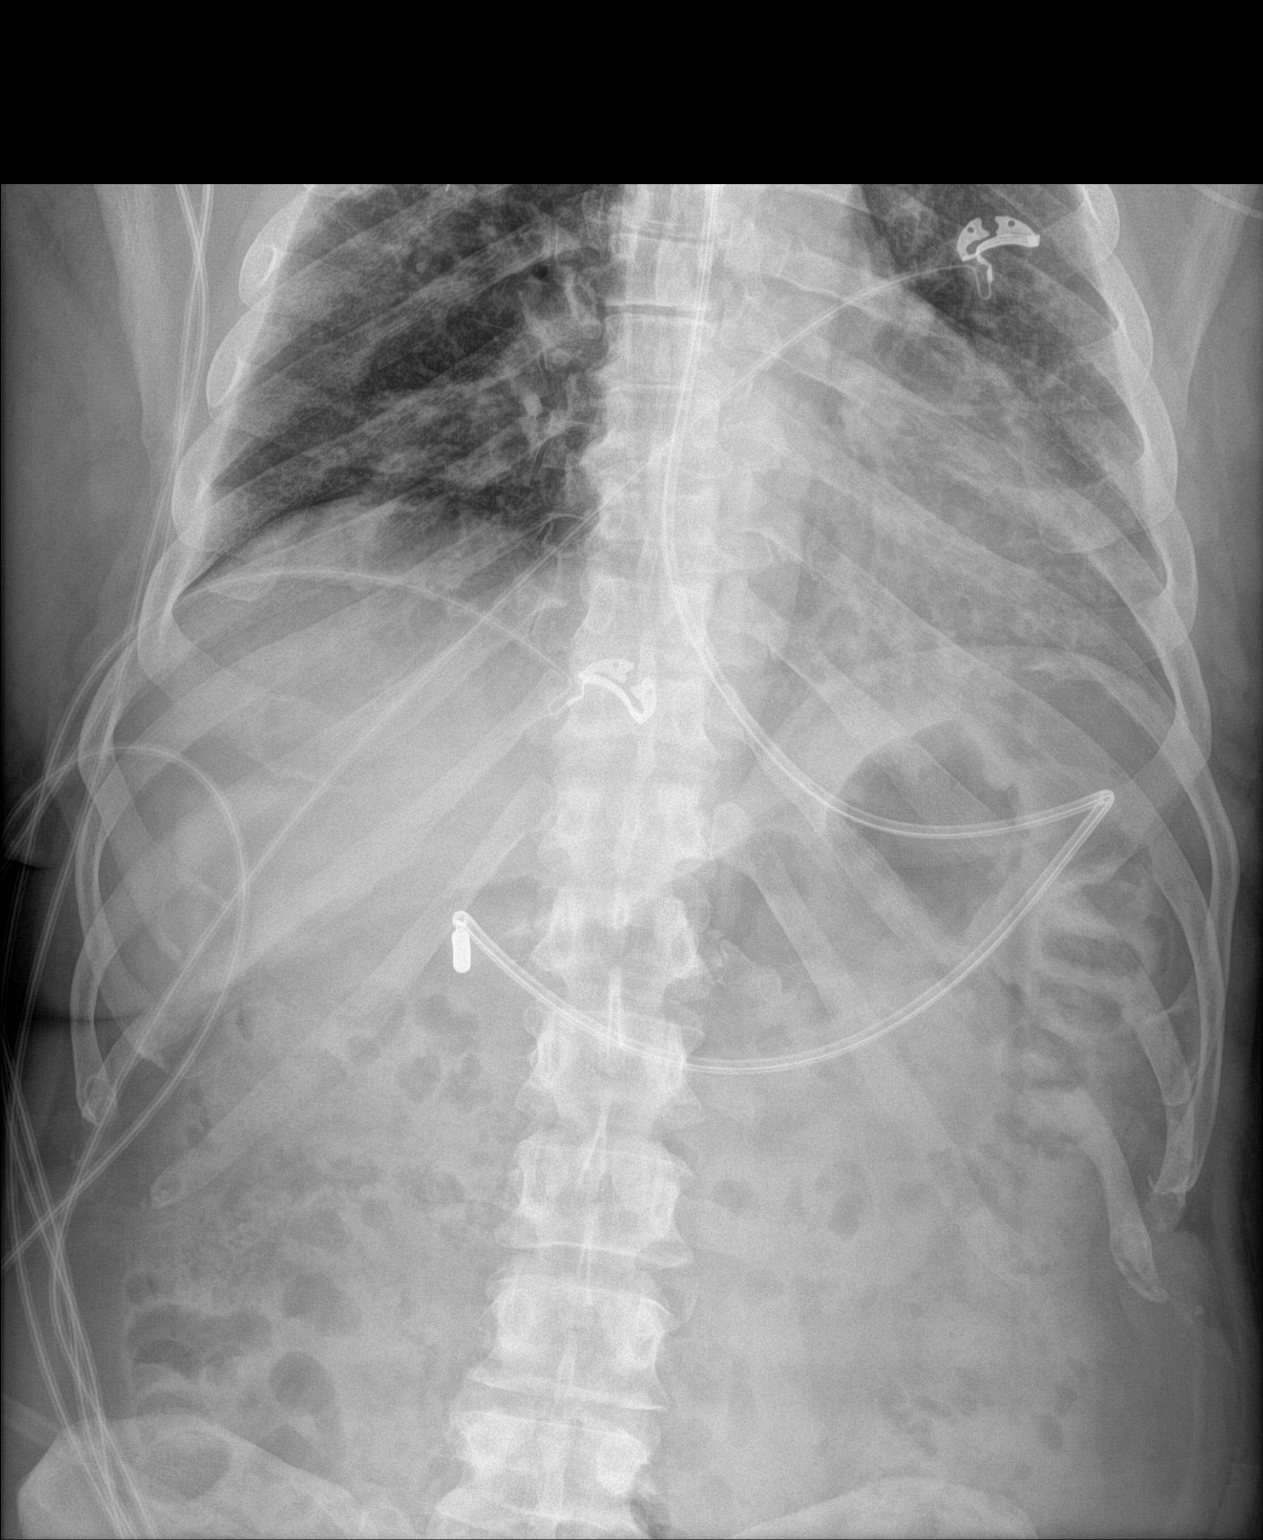

[1 of 1 positions shown; findings below may reference images not displayed]

FINDINGS: More of the feeding tube is located in the stomach. The distal tip
is likely in the proximal duodenum, it least 14 cm proximal to the
ligament of Treitz.
IMPRESSION: Much of the advancement resulted in increased feeding tube in the
gastric fundus. However, the distal tip has been advanced
approximately 1 cm and likely terminates in the proximal duodenum,
at least 14 cm proximal to the ligament of Treitz.

## 2020-12-14 IMAGING — DX PORTABLE CHEST - 1 VIEW
1 series · 1 of 1 positions shown · non-contrast
Comparison: 03/27/2019

CLINICAL DATA: Positive [id number], respiratory distress

EXAM:
PORTABLE CHEST 1 VIEW

[chest ap]
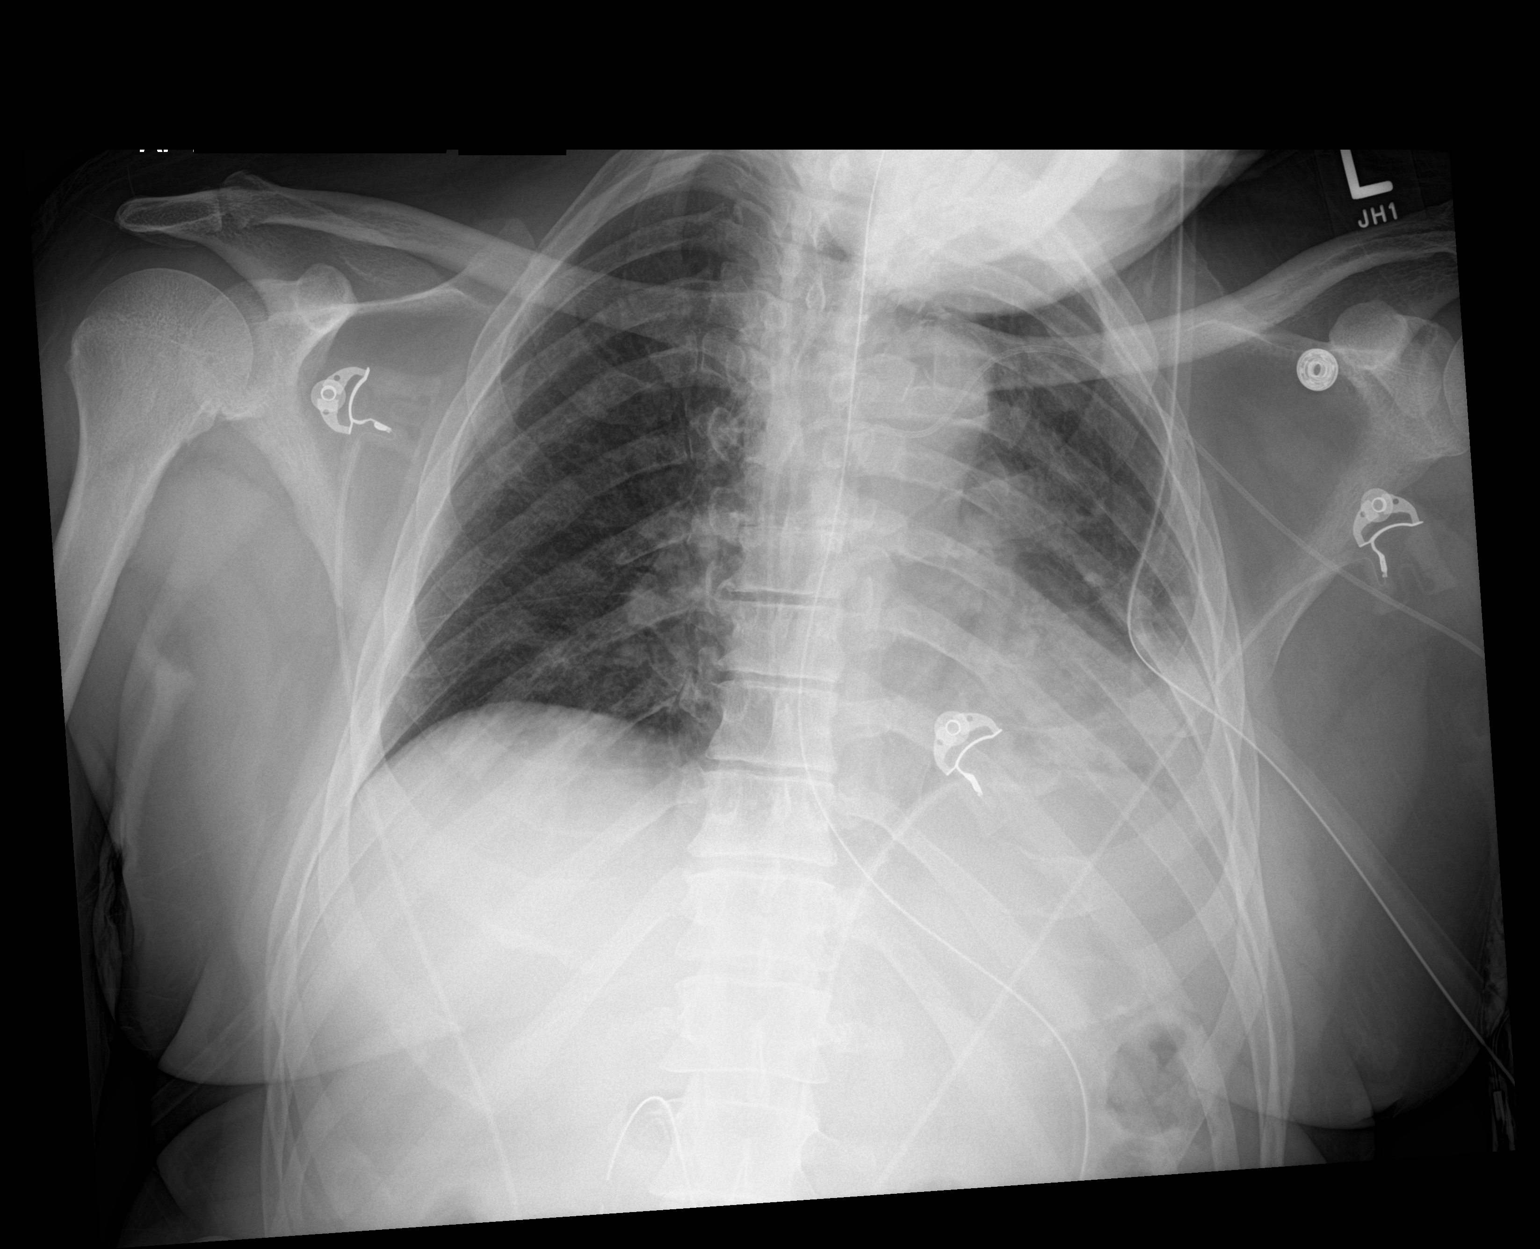

[1 of 1 positions shown; findings below may reference images not displayed]

FINDINGS: Cardiac shadows within normal limits. Left PICC line and nasogastric
catheter are noted and stable. Right lung is clear. Left basilar
infiltrate is noted. No bony abnormality is noted.
IMPRESSION: Persistent left basilar infiltrate

## 2020-12-14 IMAGING — DX PORTABLE ABDOMEN - 1 VIEW
1 series · 1 of 1 positions shown · non-contrast
Comparison: 03/26/2019

CLINICAL DATA: Check nasogastric catheter placement

EXAM:
PORTABLE ABDOMEN - 1 VIEW

[abdomen kub]
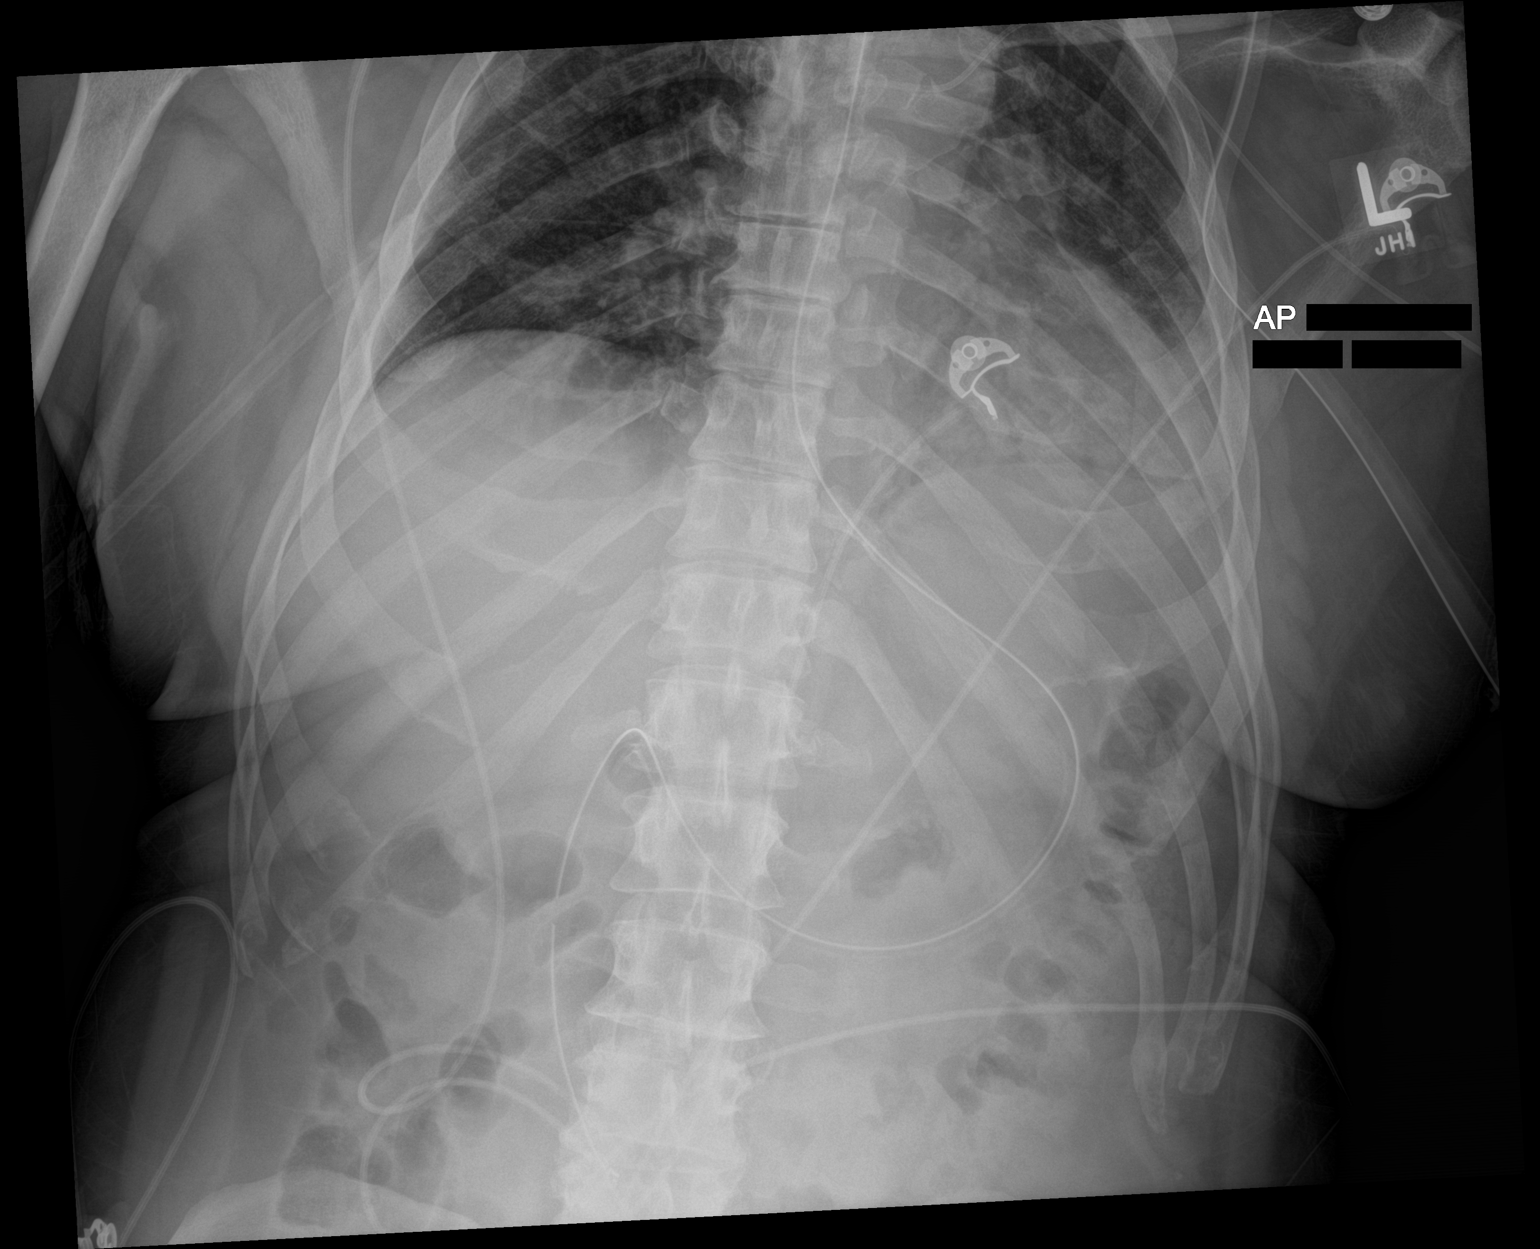

[1 of 1 positions shown; findings below may reference images not displayed]

FINDINGS: Scattered large and small bowel gas is noted. Gastric catheter is
noted extending into the duodenum. No bony abnormality is noted.
Persistent left basilar infiltrate is seen.
IMPRESSION: Left basilar infiltrate is noted.

Gastric catheter extending into the duodenum.

## 2021-03-22 ENCOUNTER — Ambulatory Visit (INDEPENDENT_AMBULATORY_CARE_PROVIDER_SITE_OTHER): Payer: Medicare Other

## 2021-03-22 ENCOUNTER — Other Ambulatory Visit: Payer: Self-pay

## 2021-03-22 ENCOUNTER — Encounter (HOSPITAL_COMMUNITY): Payer: Self-pay | Admitting: Emergency Medicine

## 2021-03-22 ENCOUNTER — Ambulatory Visit (HOSPITAL_COMMUNITY)
Admission: EM | Admit: 2021-03-22 | Discharge: 2021-03-22 | Disposition: A | Discharge status: Home / Self Care | Payer: Medicare Other | Attending: Urgent Care | Admitting: Urgent Care

## 2021-03-22 DIAGNOSIS — M25572 Pain in left ankle and joints of left foot: Secondary | ICD-10-CM | POA: Diagnosis not present

## 2021-03-22 DIAGNOSIS — E1165 Type 2 diabetes mellitus with hyperglycemia: Secondary | ICD-10-CM | POA: Diagnosis not present

## 2021-03-22 LAB — CBG MONITORING, ED: Glucose-Capillary: 304 mg/dL — ABNORMAL HIGH (ref 70–99)

## 2021-03-22 MED ORDER — TRAMADOL HCL 50 MG PO TABS: 50.0000 mg | ORAL_TABLET | Freq: Four times a day (QID) | ORAL | 0 refills | Status: DC | PRN

## 2021-03-22 NOTE — ED Provider Notes (Signed)
[place]   MRN: [id number] DOB: 03/31/1956  Subjective:   Benjamin Cohen is a 65 y.o. male presenting for 3-day history of acute onset left ankle pain.  Patient denies falls, trauma.  States that he recently started working again and has been doing a lot of standing and walking.  He does have a history of diabetes, stroke, hyperlipidemia, essential hypertension but is not taking any medications right now.  States that he does not have a PCP and has not been checking his blood sugar.  No current facility-administered medications for this encounter.  Current Outpatient Medications:  .  acetaminophen (TYLENOL) 325 MG tablet, Take 1-2 tablets (325-650 mg total) by mouth every 4 (four) hours as needed for mild pain., Disp: , Rfl:  .  amLODipine (NORVASC) 5 MG tablet, Take 1 tablet (5 mg total) by mouth daily., Disp: 30 tablet, Rfl: 0 .  atorvastatin (LIPITOR) 40 MG tablet, Take 1 tablet (40 mg total) by mouth daily at 6 PM., Disp: 30 tablet, Rfl: 0 .  blood glucose meter kit and supplies, Dispense based on patient and insurance preference. Use up to four times daily as directed. (FOR ICD-10 E10.9, E11.9)., Disp: 1 each, Rfl: 0 .  clopidogrel (PLAVIX) 75 MG tablet, Take 1 tablet (75 mg total) by mouth daily., Disp: 30 tablet, Rfl: 0 .  diclofenac sodium (VOLTAREN) 1 % GEL, Apply 2 g topically 3 (three) times daily., Disp: 1 Tube, Rfl: 1 .  glimepiride (AMARYL) 2 MG tablet, Take 1 tablet (2 mg total) by mouth daily with breakfast., Disp: 30 tablet, Rfl: 1 .  lidocaine (LIDODERM) 5 %, Place 1 patch onto the skin daily. Remove & Discard patch within 12 hours or as directed by MD, Disp: 30 patch, Rfl: 0 .  metoprolol tartrate (LOPRESSOR) 50 MG tablet, Take 1 tablet (50 mg total) by mouth 2 (two) times daily., Disp: 60 tablet, Rfl: 0 .  QUEtiapine (SEROQUEL) 50 MG tablet, Take 1 tablet (50 mg total) by mouth at bedtime., Disp: 30 tablet, Rfl: 0   No Known Allergies  Past Medical  History:  Diagnosis Date  . Diabetes mellitus without complication ([place])   . Hypertension   . Stroke [facility])      Past Surgical History:  Procedure Laterality Date  . NO PAST SURGERIES      Family History  Problem Relation Age of Onset  . Cancer Mother   . Healthy Father   . Hypertension Brother     Social History   Tobacco Use  . Smoking status: Never Smoker  . Smokeless tobacco: Never Used  Vaping Use  . Vaping Use: Never used  Substance Use Topics  . Alcohol use: No    Alcohol/week: 0.0 standard drinks  . Drug use: No    ROS   Objective:   Vitals: BP (!) 131/91 (BP Location: Right Arm) Comment (BP Location): repositioned patient  Pulse 88   Temp 98.6 F (37 C) (Oral)   Resp 20   SpO2 97%   Physical Exam Constitutional:      General: He is not in acute distress.    Appearance: Normal appearance. He is well-developed. He is not ill-appearing, toxic-appearing or diaphoretic.  HENT:     Head: Normocephalic and atraumatic.     Right Ear: External ear normal.     Left Ear: External ear normal.     Nose: Nose normal.     Mouth/Throat:     Mouth: Mucous membranes  are moist.     Pharynx: Oropharynx is clear.  Eyes:     General: No scleral icterus.    Extraocular Movements: Extraocular movements intact.     Pupils: Pupils are equal, round, and reactive to light.  Cardiovascular:     Rate and Rhythm: Normal rate and regular rhythm.     Heart sounds: Normal heart sounds. No murmur heard. No friction rub. No gallop.   Pulmonary:     Effort: Pulmonary effort is normal. No respiratory distress.     Breath sounds: Normal breath sounds. No stridor. No wheezing, rhonchi or rales.  Musculoskeletal:     Comments: Left ankle has near full range of motion.  No ecchymosis, swelling.  Tenderness over the anterior portion of his ankle.  No warmth, erythema, open wound.  Neurological:     Mental Status: He is alert and oriented to person, place, and time.  Psychiatric:         Mood and Affect: Mood normal.        Behavior: Behavior normal.        Thought Content: Thought content normal.    Results for orders placed or performed during the hospital encounter of 03/22/21 (from the past 24 hour(s))  POC CBG monitoring     Status: Abnormal   Collection Time: 03/22/21  5:02 PM  Result Value Ref Range   Glucose-Capillary 304 (H) 70 - 99 mg/dL    DG Ankle Complete Left  Result Date: 03/22/2021 CLINICAL DATA:  Left ankle pain, onset this morning. EXAM: LEFT ANKLE COMPLETE - 3+ VIEW COMPARISON:  None. FINDINGS: There is no evidence of fracture, dislocation, or joint effusion. Possible osteochondral lesion of the medial talar dome spanning 6 mm, the subchondral collapse. Mild tibial talar spurring. Trace midfoot degenerative spurring. There is a moderate plantar calcaneal spur. Soft tissues are unremarkable. IMPRESSION: 1. No acute abnormality. 2. Possible osteochondral lesion of the medial talar dome, no subchondral collapse. 3. Moderate plantar calcaneal spur. Electronically Signed   By: Keith Rake M.D.   On: 03/22/2021 16:52   Assessment and Plan :   PDMP not reviewed this encounter.  1. Acute left ankle pain   2. Uncontrolled type 2 diabetes mellitus with hyperglycemia ([place])     Recommended conservative management for inflammatory pain of his left ankle.  Advised that he rest from his job.  Blood sugar is severely uncontrolled and recommended he establish care with new PCP as soon as possible for recheck on all his chronic conditions.  We will hold off on starting any medications today as I do not know his kidney function.  Maintain strict ER precautions. Counseled patient on potential for adverse effects with medications prescribed today, patient verbalized understanding.    Jaynee Eagles, PA-C 03/22/21 1720

## 2021-03-22 NOTE — ED Triage Notes (Signed)
Patient complains of left ankle pain.  Pain started this morning.  Patient recently has returned to work after retiring for 3 years.  No known injury.  Patient has pain in left ankle.

## 2021-03-22 NOTE — Discharge Instructions (Addendum)
Please schedule Tylenol at 500 mg - 650 mg once every 6 hours as needed for ankle pain.  If you still have pain despite taking Tylenol regularly, this is breakthrough pain.  You can use tramadol once every 6 hours for this.  Once your pain is better controlled, switch back to just Tylenol.

## 2021-12-29 ENCOUNTER — Other Ambulatory Visit: Payer: Self-pay

## 2021-12-29 ENCOUNTER — Encounter (HOSPITAL_COMMUNITY): Payer: Self-pay

## 2021-12-29 ENCOUNTER — Emergency Department (HOSPITAL_COMMUNITY)
Admission: EM | Admit: 2021-12-29 | Discharge: 2021-12-30 | Disposition: A | Discharge status: Home / Self Care | Payer: Medicare Other | Attending: Emergency Medicine | Admitting: Emergency Medicine

## 2021-12-29 ENCOUNTER — Emergency Department (HOSPITAL_COMMUNITY): Payer: Medicare Other

## 2021-12-29 DIAGNOSIS — Z9114 Patient's other noncompliance with medication regimen: Secondary | ICD-10-CM | POA: Diagnosis not present

## 2021-12-29 DIAGNOSIS — R531 Weakness: Secondary | ICD-10-CM | POA: Diagnosis not present

## 2021-12-29 DIAGNOSIS — R739 Hyperglycemia, unspecified: Secondary | ICD-10-CM

## 2021-12-29 DIAGNOSIS — I10 Essential (primary) hypertension: Secondary | ICD-10-CM | POA: Insufficient documentation

## 2021-12-29 DIAGNOSIS — E871 Hypo-osmolality and hyponatremia: Secondary | ICD-10-CM | POA: Insufficient documentation

## 2021-12-29 DIAGNOSIS — R778 Other specified abnormalities of plasma proteins: Secondary | ICD-10-CM | POA: Diagnosis not present

## 2021-12-29 DIAGNOSIS — E1165 Type 2 diabetes mellitus with hyperglycemia: Secondary | ICD-10-CM | POA: Diagnosis not present

## 2021-12-29 LAB — CBC
HCT: 44.7 % (ref 39.0–52.0)
Hemoglobin: 14.6 g/dL (ref 13.0–17.0)
MCH: 26.9 pg (ref 26.0–34.0)
MCHC: 32.7 g/dL (ref 30.0–36.0)
MCV: 82.3 fL (ref 80.0–100.0)
Platelets: 217 10*3/uL (ref 150–400)
RBC: 5.43 MIL/uL (ref 4.22–5.81)
RDW: 12.1 % (ref 11.5–15.5)
WBC: 4.7 10*3/uL (ref 4.0–10.5)
nRBC: 0 % (ref 0.0–0.2)

## 2021-12-29 LAB — DIFFERENTIAL
Abs Immature Granulocytes: 0.02 10*3/uL (ref 0.00–0.07)
Basophils Absolute: 0 10*3/uL (ref 0.0–0.1)
Basophils Relative: 1 %
Eosinophils Absolute: 0 10*3/uL (ref 0.0–0.5)
Eosinophils Relative: 0 %
Immature Granulocytes: 0 %
Lymphocytes Relative: 39 %
Lymphs Abs: 1.8 10*3/uL (ref 0.7–4.0)
Monocytes Absolute: 0.2 10*3/uL (ref 0.1–1.0)
Monocytes Relative: 5 %
Neutro Abs: 2.5 10*3/uL (ref 1.7–7.7)
Neutrophils Relative %: 55 %

## 2021-12-29 LAB — BASIC METABOLIC PANEL
Anion gap: 13 (ref 5–15)
BUN: 16 mg/dL (ref 8–23)
CO2: 21 mmol/L — ABNORMAL LOW (ref 22–32)
Calcium: 8.8 mg/dL — ABNORMAL LOW (ref 8.9–10.3)
Chloride: 100 mmol/L (ref 98–111)
Creatinine, Ser: 1.12 mg/dL (ref 0.61–1.24)
GFR, Estimated: >60 mL/min (ref >60)
Glucose, Bld: 404 mg/dL — ABNORMAL HIGH (ref 70–99)
Potassium: 3.9 mmol/L (ref 3.5–5.1)
Sodium: 134 mmol/L — ABNORMAL LOW (ref 135–145)

## 2021-12-29 LAB — URINALYSIS, ROUTINE W REFLEX MICROSCOPIC
Bacteria, UA: NONE SEEN
Bilirubin Urine: NEGATIVE
Glucose, UA: >=500 mg/dL — AB
Hgb urine dipstick: NEGATIVE
Ketones, ur: 5 mg/dL — AB
Leukocytes,Ua: NEGATIVE
Nitrite: NEGATIVE
Protein, ur: NEGATIVE mg/dL
Specific Gravity, Urine: 1.033 — ABNORMAL HIGH (ref 1.005–1.030)
pH: 5 (ref 5.0–8.0)

## 2021-12-29 LAB — TROPONIN I (HIGH SENSITIVITY)
Troponin I (High Sensitivity): 37 ng/L — ABNORMAL HIGH (ref <18)
Troponin I (High Sensitivity): 38 ng/L — ABNORMAL HIGH (ref <18)

## 2021-12-29 NOTE — ED Triage Notes (Signed)
Pt arrived via POV for c/o extremity weakness and tiredx2 wks. Pt is A&Ox4. Pt grip equal bilat. Weakness is equal on both sides of body.

## 2021-12-29 NOTE — ED Provider Triage Note (Signed)
Emergency Medicine Provider Triage Evaluation Note  Benjamin Cohen , a 66 y.o. male  was evaluated in triage.  Pt complains of weakness.  Having upper extremity weakness intermittently for 2 weeks.  Denies any chest pain but does have back pain and generalized sense of weakness.  History of prior stroke, states he has not been taking his medication.  Denies any unilateral symptoms, no vision changes..  Review of Systems  Positive: Back pain, upper extremity weakness Negative: Syncope  Physical Exam  BP (!) 160/99 (BP Location: Left Arm)    Pulse 92    Temp 98.5 F (36.9 C) (Oral)    Resp 16    Ht 5\' 7"  (1.702 m)    Wt 94.3 kg    SpO2 98%    BMI 32.56 kg/m  Gen:   Awake, no distress   Resp:  Normal effort  MSK:   Moves extremities without difficulty  Other:  Cranial nerves III through XII are grossly intact.  Speech delay, slight dysarthria the patient states this is his baseline.  He is oriented x3, grip strength is equal bilaterally but reduced.  No pronator drift.  Medical Decision Making  Medically screening exam initiated at 6:54 PM.  Appropriate orders placed.  was informed that the remainder of the evaluation will be completed by another provider, this initial triage assessment does not replace that evaluation, and the importance of remaining in the ED until their evaluation is complete.  Not a code stroke.  We will check basic labs to evaluate for source of weakness   Geri [place], Theron Arista 12/29/21 1856

## 2021-12-30 DIAGNOSIS — R778 Other specified abnormalities of plasma proteins: Secondary | ICD-10-CM | POA: Diagnosis not present

## 2021-12-30 DIAGNOSIS — R531 Weakness: Secondary | ICD-10-CM | POA: Diagnosis not present

## 2021-12-30 DIAGNOSIS — E871 Hypo-osmolality and hyponatremia: Secondary | ICD-10-CM | POA: Diagnosis not present

## 2021-12-30 DIAGNOSIS — I10 Essential (primary) hypertension: Secondary | ICD-10-CM | POA: Diagnosis not present

## 2021-12-30 LAB — HEPATIC FUNCTION PANEL
ALT: 14 U/L (ref 0–44)
AST: 16 U/L (ref 15–41)
Albumin: 3.6 g/dL (ref 3.5–5.0)
Alkaline Phosphatase: 93 U/L (ref 38–126)
Bilirubin, Direct: 0.1 mg/dL (ref 0.0–0.2)
Indirect Bilirubin: 0.7 mg/dL (ref 0.3–0.9)
Total Bilirubin: 0.8 mg/dL (ref 0.3–1.2)
Total Protein: 6.4 g/dL — ABNORMAL LOW (ref 6.5–8.1)

## 2021-12-30 LAB — CK: Total CK: 100 U/L (ref 49–397)

## 2021-12-30 LAB — CBG MONITORING, ED: Glucose-Capillary: 373 mg/dL — ABNORMAL HIGH (ref 70–99)

## 2021-12-30 MED ORDER — METOPROLOL TARTRATE 50 MG PO TABS: 50.0000 mg | ORAL_TABLET | Freq: Two times a day (BID) | ORAL | 0 refills | Status: DC

## 2021-12-30 MED ORDER — GLIMEPIRIDE 2 MG PO TABS: 2.0000 mg | ORAL_TABLET | Freq: Every day | ORAL | 1 refills | Status: DC

## 2021-12-30 MED ORDER — METOPROLOL TARTRATE 25 MG PO TABS
50.0000 mg | ORAL_TABLET | Freq: Once | ORAL | Status: AC
  Administered 2021-12-30: 50 mg via ORAL
  Filled 2021-12-30: qty 2

## 2021-12-30 MED ORDER — AMLODIPINE BESYLATE 5 MG PO TABS
5.0000 mg | ORAL_TABLET | Freq: Once | ORAL | Status: AC
Start: 2021-12-30 — End: 2021-12-30
  Administered 2021-12-30: 5 mg via ORAL
  Filled 2021-12-30: qty 1

## 2021-12-30 MED ORDER — AMLODIPINE BESYLATE 5 MG PO TABS: 5.0000 mg | ORAL_TABLET | Freq: Every day | ORAL | 0 refills | Status: DC

## 2021-12-30 MED ORDER — ATORVASTATIN CALCIUM 40 MG PO TABS: 40.0000 mg | ORAL_TABLET | Freq: Every day | ORAL | 0 refills | Status: DC

## 2021-12-30 MED ORDER — CLOPIDOGREL BISULFATE 75 MG PO TABS: 75.0000 mg | ORAL_TABLET | Freq: Every day | ORAL | 0 refills | Status: DC

## 2021-12-30 MED ORDER — CLOPIDOGREL BISULFATE 75 MG PO TABS
75.0000 mg | ORAL_TABLET | Freq: Once | ORAL | Status: AC
  Administered 2021-12-30: 75 mg via ORAL
  Filled 2021-12-30: qty 1

## 2021-12-30 NOTE — Discharge Instructions (Addendum)
Your blood pressure and blood sugar were high today because you have not been taking her medications.  It is very important that you take all of your medications exactly as prescribed to make sure that your blood pressure and blood sugar are properly controlled and your cholesterol is kept to a safe level.  If you do not do this, you are much more likely to have additional strokes as well as the possibility of having a heart attack.    Please make sure that you follow-up with your primary care provider.  Please make sure that you never let yourself run out of your medications.    Return if your symptoms are getting worse.

## 2021-12-30 NOTE — ED Provider Notes (Signed)
[facility] EMERGENCY DEPARTMENT Provider Note   CSN: [id number] Arrival date & time: 12/29/21  1750     History  Chief Complaint  Patient presents with   Weakness    Ben[facility] is a 66 y.o. male.  The history is provided by the patient.  Weakness He has history of hypertension, diabetes, hyperlipidemia, stroke and comes in because of generalized weakness for the last week.  He states that there is not any focal weakness.  He feels like he cannot hold things in his hands but has not had any difficulty with ambulation.  He denies any headache or difficulty speaking.  There has been no nausea or vomiting.  Denies any chest pain.  He denies any urinary difficulty.  He does admit to having stopped all of his medications approximately 6 months ago, and has not followed up with his primary care provider.  He denies alcohol or drug use.   Home Medications Prior to Admission medications   Medication Sig Start Date End Date Taking? Authorizing Provider  acetaminophen (TYLENOL) 325 MG tablet Take 1-2 tablets (325-650 mg total) by mouth every 4 (four) hours as needed for mild pain. Patient not taking: Reported on 12/30/2021 04/25/19   Angiulli, Lavon Paganini, PA-C  amLODipine (NORVASC) 5 MG tablet Take 1 tablet (5 mg total) by mouth daily. Patient not taking: Reported on 12/30/2021 04/25/19   Angiulli, Lavon Paganini, PA-C  atorvastatin (LIPITOR) 40 MG tablet Take 1 tablet (40 mg total) by mouth daily at 6 PM. Patient not taking: Reported on 12/30/2021 04/25/19   Angiulli, Lavon Paganini, PA-C  blood glucose meter kit and supplies Dispense based on patient and insurance preference. Use up to four times daily as directed. (FOR ICD-10 E10.9, E11.9). Patient not taking: Reported on 12/30/2021 04/25/19   Angiulli, Lavon Paganini, PA-C  clopidogrel (PLAVIX) 75 MG tablet Take 1 tablet (75 mg total) by mouth daily. Patient not taking: Reported on 12/30/2021 04/25/19   Angiulli, Lavon Paganini, PA-C  diclofenac sodium  (VOLTAREN) 1 % GEL Apply 2 g topically 3 (three) times daily. Patient not taking: Reported on 12/30/2021 04/25/19   Angiulli, Lavon Paganini, PA-C  glimepiride (AMARYL) 2 MG tablet Take 1 tablet (2 mg total) by mouth daily with breakfast. Patient not taking: Reported on 12/30/2021 04/25/19   Angiulli, Lavon Paganini, PA-C  lidocaine (LIDODERM) 5 % Place 1 patch onto the skin daily. Remove & Discard patch within 12 hours or as directed by MD Patient not taking: Reported on 12/30/2021 04/25/19   Angiulli, Lavon Paganini, PA-C  metoprolol tartrate (LOPRESSOR) 50 MG tablet Take 1 tablet (50 mg total) by mouth 2 (two) times daily. Patient not taking: Reported on 12/30/2021 04/25/19   Angiulli, Lavon Paganini, PA-C  QUEtiapine (SEROQUEL) 50 MG tablet Take 1 tablet (50 mg total) by mouth at bedtime. Patient not taking: Reported on 12/30/2021 04/25/19   Angiulli, Lavon Paganini, PA-C  traMADol (ULTRAM) 50 MG tablet Take 1 tablet (50 mg total) by mouth every 6 (six) hours as needed. Patient not taking: Reported on 12/30/2021 03/22/21   Jaynee Eagles, PA-C      Allergies    Patient has no known allergies.    Review of Systems   Review of Systems  Neurological:  Positive for weakness.  All other systems reviewed and are negative.  Physical Exam Updated Vital Signs BP (!) 190/113 (BP Location: Right Arm)    Pulse 78    Temp 97.7 F (36.5 C) (Oral)  Resp 20    Ht 5' 7" (1.702 m)    Wt 94.3 kg    SpO2 99%    BMI 32.56 kg/m  Physical Exam Vitals and nursing note reviewed.  66 year old male, resting comfortably and in no acute distress. Vital signs are significant for elevated blood pressure. Oxygen saturation is 99%, which is normal. Head is normocephalic and atraumatic. PERRLA, EOMI. Oropharynx is clear. Neck is nontender and supple without adenopathy or JVD.  There are no carotid bruits. Back is nontender and there is no CVA tenderness. Lungs are clear without rales, wheezes, or rhonchi. Chest is nontender. Heart has regular rate and  rhythm without murmur. Abdomen is soft, flat, nontender without masses or hepatosplenomegaly and peristalsis is normoactive. Extremities have 1+ edema, full range of motion is present. Skin is warm and dry without rash. Neurologic: Mental status is normal, cranial nerves are intact.  Strength is 5/5 in all 4 extremities.  There is no pronator drift.  Station and gait are normal.  ED Results / Procedures / Treatments   Labs (all labs ordered are listed, but only abnormal results are displayed) Labs Reviewed  BASIC METABOLIC PANEL - Abnormal; Notable for the following components:      Result Value   Sodium 134 (*)    CO2 21 (*)    Glucose, Bld 404 (*)    Calcium 8.8 (*)    All other components within normal limits  URINALYSIS, ROUTINE W REFLEX MICROSCOPIC - Abnormal; Notable for the following components:   Color, Urine STRAW (*)    Specific Gravity, Urine 1.033 (*)    Glucose, UA >=500 (*)    Ketones, ur 5 (*)    All other components within normal limits  TROPONIN I (HIGH SENSITIVITY) - Abnormal; Notable for the following components:   Troponin I (High Sensitivity) 37 (*)    All other components within normal limits  TROPONIN I (HIGH SENSITIVITY) - Abnormal; Notable for the following components:   Troponin I (High Sensitivity) 38 (*)    All other components within normal limits  CBC  DIFFERENTIAL  CBG MONITORING, ED    EKG EKG Interpretation  Date/Time:  Thursday December 29 2021 18:35:52 EST Ventricular Rate:  95 PR Interval:  148 QRS Duration: 82 QT Interval:  346 QTC Calculation: 434 R Axis:   -52 Text Interpretation: Normal sinus rhythm Pulmonary disease pattern Left anterior fascicular block Minimal voltage criteria for LVH, may be normal variant ( R in aVL ) T wave abnormality, consider lateral ischemia Abnormal ECG When compared with ECG of 11-Mar-2019 12:24, When compared with ECG of 03/11/2019, T wave abnormality is more prominent Confirmed by Delora Fuel ([id number]) on  12/30/2021 3:54:09 AM  Radiology CT Head Wo Contrast  Result Date: 12/29/2021 CLINICAL DATA:  Bilateral extremity weakness and fatigue x2 weeks. EXAM: CT HEAD WITHOUT CONTRAST TECHNIQUE: Contiguous axial images were obtained from the base of the skull through the vertex without intravenous contrast. RADIATION DOSE REDUCTION: This exam was performed according to the departmental dose-optimization program which includes automated exposure control, adjustment of the mA and/or kV according to patient size and/or use of iterative reconstruction technique. COMPARISON:  Apr 29, 2018 FINDINGS: Brain: There is mild cerebral atrophy with widening of the extra-axial spaces and ventricular dilatation. There are areas of decreased attenuation within the white matter tracts of the supratentorial brain, consistent with microvascular disease changes. Chronic bilateral basal ganglia infarcts are seen. Vascular: No hyperdense vessel or unexpected calcification. Skull: Normal.  Negative for fracture or focal lesion. Sinuses/Orbits: No acute finding. Other: None. IMPRESSION: 1. Generalized cerebral atrophy. 2. No acute intracranial abnormality. Electronically Signed   By: Virgina Norfolk M.D.   On: 12/29/2021 20:30    Procedures Procedures  Cardiac monitor shows normal sinus rhythm per my interpretation.  Medications Ordered in ED Medications  amLODipine (NORVASC) tablet 5 mg (5 mg Oral Given 12/30/21 0513)  clopidogrel (PLAVIX) tablet 75 mg (75 mg Oral Given 12/30/21 0513)  metoprolol tartrate (LOPRESSOR) tablet 50 mg (50 mg Oral Given 12/30/21 4010)    ED Course/ Medical Decision Making/ A&P                           Medical Decision Making Amount and/or Complexity of Data Reviewed Labs: ordered.  Risk Prescription drug management.   Generalized weakness without any evidence of any focal neurologic deficit.  Poorly controlled hypertension in the setting of medication noncompliance.  Old records are reviewed,  and he had been admitted to the hospital in March 2020 with COVID-19 with associated encephalopathy, discharged on 04/26/2019 with new prescriptions for all of his medications.  There was an ED visit on 06/27/2019 at which time medication refill were given, but there is no record of any follow-up visits following that.  ED work-up includes CT of head which shows no acute intracranial abnormality.  I have independently viewed the images, and agree with radiologist interpretation.  ECG does show some T wave abnormality in the anterolateral leads which is nonspecific, and most recent ECG was from 03/11/2019.  Labs showed elevated glucose with mild hyponatremia presumably secondary to the hyperglycemia.  There is a normal anion gap, no evidence of ketoacidosis.  Urinalysis shows no evidence of UTI.  Because of complaints of weakness, will check CK and also hepatic function panel, he is given doses of his amlodipine, clopidogrel, metoprolol.  CK and hepatic function panels are normal.  Blood pressure has come down slightly with restarting antihypertensive medication.  Drip is mildly elevated, but stable indicating probable demand ischemia and not ACS.  He is given new prescriptions for amlodipine, atorvastatin, clopidogrel, glimepiride, metoprolol.  He is encouraged to follow-up with his primary care provider.  Importance of not running out of his medications was stressed.  Return precautions discussed.        Final Clinical Impression(s) / ED Diagnoses Final diagnoses:  Weakness  Poorly-controlled hypertension  Noncompliance with medication regimen  Hyperglycemia  Hyponatremia  Elevated troponin    Rx / DC Orders ED Discharge Orders          Ordered    amLODipine (NORVASC) 5 MG tablet  Daily        12/30/21 0518    atorvastatin (LIPITOR) 40 MG tablet  Daily-1800        12/30/21 0518    clopidogrel (PLAVIX) 75 MG tablet  Daily        12/30/21 0518    glimepiride (AMARYL) 2 MG tablet  Daily with  breakfast        12/30/21 0518    metoprolol tartrate (LOPRESSOR) 50 MG tablet  2 times daily        12/30/21 2725              Delora Fuel, MD 36/64/40 [phone]

## 2022-04-05 ENCOUNTER — Emergency Department (HOSPITAL_COMMUNITY): Payer: Medicare Other

## 2022-04-05 ENCOUNTER — Inpatient Hospital Stay (HOSPITAL_COMMUNITY)
Admission: EM | Admit: 2022-04-05 | Discharge: 2022-04-08 | DRG: 178 | Disposition: A | Discharge status: Home Health | Payer: Medicare Other | Attending: Internal Medicine | Admitting: Internal Medicine

## 2022-04-05 DIAGNOSIS — R1310 Dysphagia, unspecified: Secondary | ICD-10-CM | POA: Diagnosis not present

## 2022-04-05 DIAGNOSIS — I160 Hypertensive urgency: Secondary | ICD-10-CM | POA: Diagnosis not present

## 2022-04-05 DIAGNOSIS — Z79899 Other long term (current) drug therapy: Secondary | ICD-10-CM | POA: Diagnosis not present

## 2022-04-05 DIAGNOSIS — S199XXA Unspecified injury of neck, initial encounter: Secondary | ICD-10-CM | POA: Diagnosis not present

## 2022-04-05 DIAGNOSIS — R7989 Other specified abnormal findings of blood chemistry: Secondary | ICD-10-CM | POA: Diagnosis not present

## 2022-04-05 DIAGNOSIS — R55 Syncope and collapse: Secondary | ICD-10-CM | POA: Diagnosis not present

## 2022-04-05 DIAGNOSIS — R8290 Unspecified abnormal findings in urine: Secondary | ICD-10-CM | POA: Diagnosis not present

## 2022-04-05 DIAGNOSIS — E782 Mixed hyperlipidemia: Secondary | ICD-10-CM

## 2022-04-05 DIAGNOSIS — E876 Hypokalemia: Secondary | ICD-10-CM | POA: Diagnosis not present

## 2022-04-05 DIAGNOSIS — T466X6A Underdosing of antihyperlipidemic and antiarteriosclerotic drugs, initial encounter: Secondary | ICD-10-CM | POA: Diagnosis present

## 2022-04-05 DIAGNOSIS — E1165 Type 2 diabetes mellitus with hyperglycemia: Secondary | ICD-10-CM | POA: Diagnosis present

## 2022-04-05 DIAGNOSIS — N3000 Acute cystitis without hematuria: Secondary | ICD-10-CM | POA: Diagnosis not present

## 2022-04-05 DIAGNOSIS — Z91148 Patient's other noncompliance with medication regimen for other reason: Secondary | ICD-10-CM | POA: Diagnosis not present

## 2022-04-05 DIAGNOSIS — G9349 Other encephalopathy: Secondary | ICD-10-CM | POA: Diagnosis not present

## 2022-04-05 DIAGNOSIS — Z043 Encounter for examination and observation following other accident: Secondary | ICD-10-CM | POA: Diagnosis not present

## 2022-04-05 DIAGNOSIS — R059 Cough, unspecified: Secondary | ICD-10-CM | POA: Diagnosis not present

## 2022-04-05 DIAGNOSIS — T465X6A Underdosing of other antihypertensive drugs, initial encounter: Secondary | ICD-10-CM | POA: Diagnosis not present

## 2022-04-05 DIAGNOSIS — R809 Proteinuria, unspecified: Secondary | ICD-10-CM | POA: Diagnosis not present

## 2022-04-05 DIAGNOSIS — Z8249 Family history of ischemic heart disease and other diseases of the circulatory system: Secondary | ICD-10-CM

## 2022-04-05 DIAGNOSIS — E785 Hyperlipidemia, unspecified: Secondary | ICD-10-CM | POA: Diagnosis present

## 2022-04-05 DIAGNOSIS — I499 Cardiac arrhythmia, unspecified: Secondary | ICD-10-CM | POA: Diagnosis not present

## 2022-04-05 DIAGNOSIS — Z7984 Long term (current) use of oral hypoglycemic drugs: Secondary | ICD-10-CM

## 2022-04-05 DIAGNOSIS — R778 Other specified abnormalities of plasma proteins: Secondary | ICD-10-CM | POA: Diagnosis not present

## 2022-04-05 DIAGNOSIS — R0682 Tachypnea, not elsewhere classified: Secondary | ICD-10-CM | POA: Diagnosis not present

## 2022-04-05 DIAGNOSIS — U071 COVID-19: Principal | ICD-10-CM | POA: Diagnosis present

## 2022-04-05 DIAGNOSIS — Z91138 Patient's unintentional underdosing of medication regimen for other reason: Secondary | ICD-10-CM | POA: Diagnosis not present

## 2022-04-05 DIAGNOSIS — R296 Repeated falls: Secondary | ICD-10-CM | POA: Diagnosis present

## 2022-04-05 DIAGNOSIS — Z8616 Personal history of COVID-19: Secondary | ICD-10-CM

## 2022-04-05 DIAGNOSIS — Z7902 Long term (current) use of antithrombotics/antiplatelets: Secondary | ICD-10-CM

## 2022-04-05 DIAGNOSIS — Z8673 Personal history of transient ischemic attack (TIA), and cerebral infarction without residual deficits: Secondary | ICD-10-CM | POA: Diagnosis not present

## 2022-04-05 DIAGNOSIS — I071 Rheumatic tricuspid insufficiency: Secondary | ICD-10-CM | POA: Diagnosis not present

## 2022-04-05 DIAGNOSIS — R6889 Other general symptoms and signs: Secondary | ICD-10-CM | POA: Diagnosis not present

## 2022-04-05 DIAGNOSIS — R739 Hyperglycemia, unspecified: Secondary | ICD-10-CM | POA: Diagnosis not present

## 2022-04-05 DIAGNOSIS — I10 Essential (primary) hypertension: Secondary | ICD-10-CM | POA: Diagnosis not present

## 2022-04-05 DIAGNOSIS — E860 Dehydration: Secondary | ICD-10-CM | POA: Diagnosis not present

## 2022-04-05 DIAGNOSIS — R531 Weakness: Secondary | ICD-10-CM | POA: Diagnosis not present

## 2022-04-05 DIAGNOSIS — I3139 Other pericardial effusion (noninflammatory): Secondary | ICD-10-CM | POA: Diagnosis not present

## 2022-04-05 DIAGNOSIS — R404 Transient alteration of awareness: Secondary | ICD-10-CM | POA: Diagnosis not present

## 2022-04-05 DIAGNOSIS — Z743 Need for continuous supervision: Secondary | ICD-10-CM | POA: Diagnosis not present

## 2022-04-05 DIAGNOSIS — R9431 Abnormal electrocardiogram [ECG] [EKG]: Secondary | ICD-10-CM | POA: Diagnosis not present

## 2022-04-05 DIAGNOSIS — R000 Tachycardia, unspecified: Secondary | ICD-10-CM | POA: Diagnosis not present

## 2022-04-05 LAB — I-STAT VENOUS BLOOD GAS, ED
Acid-Base Excess: 0 mmol/L (ref 0.0–2.0)
Bicarbonate: 23.8 mmol/L (ref 20.0–28.0)
Calcium, Ion: 1.04 mmol/L — ABNORMAL LOW (ref 1.15–1.40)
HCT: 47 % (ref 39.0–52.0)
Hemoglobin: 16 g/dL (ref 13.0–17.0)
O2 Saturation: 78 %
Potassium: 3.6 mmol/L (ref 3.5–5.1)
Sodium: 137 mmol/L (ref 135–145)
TCO2: 25 mmol/L (ref 22–32)
pCO2, Ven: 34.5 mmHg — ABNORMAL LOW (ref 44–60)
pH, Ven: 7.447 — ABNORMAL HIGH (ref 7.25–7.43)
pO2, Ven: 40 mmHg (ref 32–45)

## 2022-04-05 LAB — I-STAT CHEM 8, ED
BUN: 9 mg/dL (ref 8–23)
Calcium, Ion: 1.03 mmol/L — ABNORMAL LOW (ref 1.15–1.40)
Chloride: 101 mmol/L (ref 98–111)
Creatinine, Ser: 1 mg/dL (ref 0.61–1.24)
Glucose, Bld: 407 mg/dL — ABNORMAL HIGH (ref 70–99)
HCT: 49 % (ref 39.0–52.0)
Hemoglobin: 16.7 g/dL (ref 13.0–17.0)
Potassium: 3.7 mmol/L (ref 3.5–5.1)
Sodium: 137 mmol/L (ref 135–145)
TCO2: 24 mmol/L (ref 22–32)

## 2022-04-05 LAB — URINALYSIS, ROUTINE W REFLEX MICROSCOPIC
Bilirubin Urine: NEGATIVE
Glucose, UA: >=500 mg/dL — AB
Ketones, ur: 20 mg/dL — AB
Leukocytes,Ua: NEGATIVE
Nitrite: NEGATIVE
Protein, ur: 100 mg/dL — AB
Specific Gravity, Urine: 1.032 — ABNORMAL HIGH (ref 1.005–1.030)
pH: 5 (ref 5.0–8.0)

## 2022-04-05 LAB — DIFFERENTIAL
Abs Immature Granulocytes: 0.04 K/uL (ref 0.00–0.07)
Basophils Absolute: 0 K/uL (ref 0.0–0.1)
Basophils Relative: 1 %
Eosinophils Absolute: 0 K/uL (ref 0.0–0.5)
Eosinophils Relative: 0 %
Immature Granulocytes: 1 %
Lymphocytes Relative: 15 %
Lymphs Abs: 1.2 K/uL (ref 0.7–4.0)
Monocytes Absolute: 0.6 K/uL (ref 0.1–1.0)
Monocytes Relative: 8 %
Neutro Abs: 5.9 K/uL (ref 1.7–7.7)
Neutrophils Relative %: 75 %

## 2022-04-05 LAB — RESP PANEL BY RT-PCR (FLU A&B, COVID) ARPGX2
Influenza A by PCR: NEGATIVE
Influenza B by PCR: NEGATIVE
SARS Coronavirus 2 by RT PCR: POSITIVE — AB

## 2022-04-05 LAB — COMPREHENSIVE METABOLIC PANEL WITH GFR
ALT: 20 U/L (ref 0–44)
AST: 29 U/L (ref 15–41)
Albumin: 3.5 g/dL (ref 3.5–5.0)
Alkaline Phosphatase: 96 U/L (ref 38–126)
Anion gap: 12 (ref 5–15)
BUN: 8 mg/dL (ref 8–23)
CO2: 22 mmol/L (ref 22–32)
Calcium: 8.2 mg/dL — ABNORMAL LOW (ref 8.9–10.3)
Chloride: 103 mmol/L (ref 98–111)
Creatinine, Ser: 1.21 mg/dL (ref 0.61–1.24)
GFR, Estimated: >60 mL/min (ref >60)
Glucose, Bld: 392 mg/dL — ABNORMAL HIGH (ref 70–99)
Potassium: 3.7 mmol/L (ref 3.5–5.1)
Sodium: 137 mmol/L (ref 135–145)
Total Bilirubin: 1.3 mg/dL — ABNORMAL HIGH (ref 0.3–1.2)
Total Protein: 6.8 g/dL (ref 6.5–8.1)

## 2022-04-05 LAB — PROTIME-INR
INR: 1 (ref 0.8–1.2)
Prothrombin Time: 13.6 s (ref 11.4–15.2)

## 2022-04-05 LAB — CBG MONITORING, ED: Glucose-Capillary: 339 mg/dL — ABNORMAL HIGH (ref 70–99)

## 2022-04-05 LAB — LACTIC ACID, PLASMA
Lactic Acid, Venous: 2.6 mmol/L (ref 0.5–1.9)
Lactic Acid, Venous: 1.7 mmol/L (ref 0.5–1.9)

## 2022-04-05 LAB — RAPID URINE DRUG SCREEN, HOSP PERFORMED
Amphetamines: NOT DETECTED
Barbiturates: NOT DETECTED
Benzodiazepines: NOT DETECTED
Cocaine: NOT DETECTED
Opiates: NOT DETECTED
Tetrahydrocannabinol: NOT DETECTED

## 2022-04-05 LAB — ETHANOL: Alcohol, Ethyl (B): <10 mg/dL (ref <10)

## 2022-04-05 LAB — CBC
HCT: 47.3 % (ref 39.0–52.0)
Hemoglobin: 15.8 g/dL (ref 13.0–17.0)
MCH: 27.5 pg (ref 26.0–34.0)
MCHC: 33.4 g/dL (ref 30.0–36.0)
MCV: 82.3 fL (ref 80.0–100.0)
Platelets: 207 10*3/uL (ref 150–400)
RBC: 5.75 MIL/uL (ref 4.22–5.81)
RDW: 12.5 % (ref 11.5–15.5)
WBC: 7.8 10*3/uL (ref 4.0–10.5)
nRBC: 0 % (ref 0.0–0.2)

## 2022-04-05 LAB — APTT: aPTT: 30 s (ref 24–36)

## 2022-04-05 MED ORDER — SODIUM CHLORIDE 0.9 % IV BOLUS
500.0000 mL | Freq: Once | INTRAVENOUS | Status: AC
  Administered 2022-04-05: 500 mL via INTRAVENOUS

## 2022-04-05 MED ORDER — SODIUM CHLORIDE 0.9 % IV BOLUS (SEPSIS)
1000.0000 mL | Freq: Once | INTRAVENOUS | Status: AC
  Administered 2022-04-05: 1000 mL via INTRAVENOUS

## 2022-04-05 MED ORDER — AMLODIPINE BESYLATE 5 MG PO TABS
5.0000 mg | ORAL_TABLET | Freq: Once | ORAL | Status: AC
  Administered 2022-04-05: 5 mg via ORAL
  Filled 2022-04-05: qty 1

## 2022-04-05 MED ORDER — INSULIN ASPART 100 UNIT/ML IJ SOLN
0.0000 [IU] | INTRAMUSCULAR | Status: DC
  Administered 2022-04-06 (×2): 5 [IU] via SUBCUTANEOUS
  Administered 2022-04-06 (×2): 3 [IU] via SUBCUTANEOUS
  Administered 2022-04-06: 8 [IU] via SUBCUTANEOUS
  Administered 2022-04-06: 2 [IU] via SUBCUTANEOUS
  Administered 2022-04-07: 3 [IU] via SUBCUTANEOUS
  Administered 2022-04-07: 5 [IU] via SUBCUTANEOUS
  Administered 2022-04-07: 11 [IU] via SUBCUTANEOUS
  Administered 2022-04-07: 8 [IU] via SUBCUTANEOUS
  Administered 2022-04-08 (×3): 5 [IU] via SUBCUTANEOUS
  Administered 2022-04-08: 3 [IU] via SUBCUTANEOUS

## 2022-04-05 MED ORDER — SODIUM CHLORIDE 0.9 % IV SOLN: 1000.0000 mL | INTRAVENOUS | Status: DC

## 2022-04-05 MED ORDER — INSULIN ASPART 100 UNIT/ML IJ SOLN
8.0000 [IU] | Freq: Once | INTRAMUSCULAR | Status: AC
  Administered 2022-04-05: 8 [IU] via SUBCUTANEOUS

## 2022-04-05 MED ORDER — SODIUM CHLORIDE 0.9 % IV SOLN
2.0000 g | Freq: Once | INTRAVENOUS | Status: AC
  Administered 2022-04-05: 2 g via INTRAVENOUS
  Filled 2022-04-05: qty 20

## 2022-04-05 MED ORDER — SODIUM CHLORIDE 0.9 % IV SOLN
100.0000 mL/h | INTRAVENOUS | Status: DC
  Administered 2022-04-05 – 2022-04-06 (×2): 100 mL/h via INTRAVENOUS

## 2022-04-05 MED ORDER — ACETAMINOPHEN 325 MG PO TABS
650.0000 mg | ORAL_TABLET | Freq: Once | ORAL | Status: AC
  Administered 2022-04-05: 650 mg via ORAL
  Filled 2022-04-05: qty 2

## 2022-04-05 MED ORDER — SODIUM CHLORIDE 0.9 % IV BOLUS
1000.0000 mL | Freq: Once | INTRAVENOUS | Status: AC
Start: 2022-04-05 — End: 2022-04-05
  Administered 2022-04-05: 1000 mL via INTRAVENOUS

## 2022-04-05 NOTE — ED Provider Notes (Signed)
?[facility] ?Provider Note ? ? ?CSN: [id number] ?Arrival date & time: 04/05/22  1820 ? ?  ? ?History ? ?Chief complaint, fall, syncope ? ?[facility] is a 66 y.o. male. ? ?HPI ? ?Patient has history of stroke, hypertension, diabetes, hypertensive urgency, stroke who presents to the ED for evaluation after a fall.  Patient was found lying on the ground in the parking lot of a gas station.  He had an unwitnessed fall.  Patient does have history of taking anticoagulants.  He was brought into the ED as a level 2 trauma for possible head injury.  Patient denies feeling ill earlier today.  He does not recall what happened.  Patient denies any pain right now.  He is not having a headache.  Patient denies any chest pain or shortness of breath.  Patient denies having any medical problems although the medical records do indicate that he has a history of several medical problems.  Patient was noted to be hyperglycemic and hypertensive by EMS. ? ?Home Medications ?Prior to Admission medications   ?Medication Sig Start Date End Date Taking? Authorizing Provider  ?acetaminophen (TYLENOL) 325 MG tablet Take 1-2 tablets (325-650 mg total) by mouth every 4 (four) hours as needed for mild pain. ?Patient not taking: Reported on 12/30/2021 04/25/19   Cathlyn Parsons, PA-C  ?amLODipine (NORVASC) 5 MG tablet Take 1 tablet (5 mg total) by mouth daily. 0/10/27   Delora Fuel, MD  ?atorvastatin (LIPITOR) 40 MG tablet Take 1 tablet (40 mg total) by mouth daily at 6 PM. 2/53/66   Delora Fuel, MD  ?blood glucose meter kit and supplies Dispense based on patient and insurance preference. Use up to four times daily as directed. (FOR ICD-10 E10.9, E11.9). ?Patient not taking: Reported on 12/30/2021 04/25/19   Cathlyn Parsons, PA-C  ?clopidogrel (PLAVIX) 75 MG tablet Take 1 tablet (75 mg total) by mouth daily. 4/40/34   Delora Fuel, MD  ?diclofenac sodium (VOLTAREN) 1 % GEL Apply 2 g topically 3 (three)  times daily. ?Patient not taking: Reported on 12/30/2021 04/25/19   Angiulli, Lavon Paganini, PA-C  ?glimepiride (AMARYL) 2 MG tablet Take 1 tablet (2 mg total) by mouth daily with breakfast. 7/42/59   Delora Fuel, MD  ?metoprolol tartrate (LOPRESSOR) 50 MG tablet Take 1 tablet (50 mg total) by mouth 2 (two) times daily. 5/63/87   Delora Fuel, MD  ?QUEtiapine (SEROQUEL) 50 MG tablet Take 1 tablet (50 mg total) by mouth at bedtime. ?Patient not taking: Reported on 12/30/2021 04/25/19   Cathlyn Parsons, PA-C  ?   ? ?Allergies    ?Patient has no known allergies.   ? ?Review of Systems   ?Review of Systems  ?Constitutional:  Negative for fever.  ? ?Physical Exam ?Updated Vital Signs ?BP (!) 172/99   Pulse 100   Temp (S) (!) 101.2 ?F (38.4 ?C) (Oral)   Resp (!) 39   Ht 1.727 m (_0 )   Wt 104.3 kg   SpO2 95%   BMI 34.97 kg/m?  ?Physical Exam ?Vitals and nursing note reviewed.  ?Constitutional:   ?   General: He is not in acute distress. ?   Appearance: He is well-developed.  ?HENT:  ?   Head: Normocephalic and atraumatic.  ?   Comments: Patient is in a C-spine collar ?   Right Ear: External ear normal.  ?   Left Ear: External ear normal.  ?Eyes:  ?   General: No scleral  icterus.    ?   Right eye: No discharge.     ?   Left eye: No discharge.  ?   Conjunctiva/sclera: Conjunctivae normal.  ?Neck:  ?   Trachea: No tracheal deviation.  ?Cardiovascular:  ?   Rate and Rhythm: Regular rhythm. Tachycardia present.  ?Pulmonary:  ?   Effort: Pulmonary effort is normal. No respiratory distress.  ?   Breath sounds: Normal breath sounds. No stridor. No wheezing or rales.  ?Abdominal:  ?   General: Bowel sounds are normal. There is no distension.  ?   Palpations: Abdomen is soft.  ?   Tenderness: There is no abdominal tenderness. There is no guarding or rebound.  ?Musculoskeletal:     ?   General: No tenderness.  ?   Cervical back: Neck supple.  ?Skin: ?   General: Skin is warm and dry.  ?   Findings: No rash.  ?Neurological:  ?    Mental Status: He is alert and oriented to person, place, and time.  ?   Cranial Nerves: No cranial nerve deficit.  ?   Sensory: No sensory deficit.  ?   Motor: No abnormal muscle tone or seizure activity.  ?   Coordination: Coordination normal.  ?   Comments: No pronator drift bilateral upper extrem, able to hold both legs off bed for 5 seconds, sensation intact in all extremities, no visual field cuts, no left or right sided neglect, normal finger-nose exam bilaterally, no nystagmus noted ? ?No facial droop, extraocular movements intact, tongue midline  ? ? ?ED Results / Procedures / Treatments   ?Labs ?(all labs ordered are listed, but only abnormal results are displayed) ?Labs Reviewed  ?RESP PANEL BY RT-PCR (FLU A&B, COVID) ARPGX2 - Abnormal; Notable for the following components:  ?    Result Value  ? SARS Coronavirus 2 by RT PCR POSITIVE (*)   ? All other components within normal limits  ?COMPREHENSIVE METABOLIC PANEL - Abnormal; Notable for the following components:  ? Glucose, Bld 392 (*)   ? Calcium 8.2 (*)   ? Total Bilirubin 1.3 (*)   ? All other components within normal limits  ?URINALYSIS, ROUTINE W REFLEX MICROSCOPIC - Abnormal; Notable for the following components:  ? Specific Gravity, Urine 1.032 (*)   ? Glucose, UA >=500 (*)   ? Hgb urine dipstick SMALL (*)   ? Ketones, ur 20 (*)   ? Protein, ur 100 (*)   ? Bacteria, UA RARE (*)   ? All other components within normal limits  ?LACTIC ACID, PLASMA - Abnormal; Notable for the following components:  ? Lactic Acid, Venous 2.6 (*)   ? All other components within normal limits  ?I-STAT CHEM 8, ED - Abnormal; Notable for the following components:  ? Sodium 132 (*)   ? Potassium 8.2 (*)   ? Glucose, Bld 421 (*)   ? Calcium, Ion 0.90 (*)   ? Hemoglobin 17.3 (*)   ? All other components within normal limits  ?I-STAT VENOUS BLOOD GAS, ED - Abnormal; Notable for the following components:  ? pH, Ven 7.442 (*)   ? pCO2, Ven 36.8 (*)   ? pO2, Ven 47 (*)   ? Sodium  131 (*)   ? Potassium 8.2 (*)   ? Calcium, Ion 0.90 (*)   ? All other components within normal limits  ?I-STAT CHEM 8, ED - Abnormal; Notable for the following components:  ? Glucose, Bld 407 (*)   ? Calcium,  Ion 1.03 (*)   ? All other components within normal limits  ?I-STAT VENOUS BLOOD GAS, ED - Abnormal; Notable for the following components:  ? pH, Ven 7.447 (*)   ? pCO2, Ven 34.5 (*)   ? Calcium, Ion 1.04 (*)   ? All other components within normal limits  ?CULTURE, BLOOD (ROUTINE X 2)  ?CULTURE, BLOOD (ROUTINE X 2)  ?URINE CULTURE  ?ETHANOL  ?PROTIME-INR  ?APTT  ?CBC  ?DIFFERENTIAL  ?RAPID URINE DRUG SCREEN, HOSP PERFORMED  ?LACTIC ACID, PLASMA  ? ? ?EKG ?EKG Interpretation ? ?Date/Time:  Wednesday April 05 2022 18:22:09 EDT ?Ventricular Rate:  127 ?PR Interval:  146 ?QRS Duration: 83 ?QT Interval:  317 ?QTC Calculation: 461 ?R Axis:   -71 ?Text Interpretation: Sinus tachycardia Left anterior fascicular block Abnormal R-wave progression, late transition Left ventricular hypertrophy Since last tracing rate faster lateral t wave changes resolved since last tracing Confirmed by Dorie Rank [phone]) on 04/05/2022 6:25:53 PM ? ?Radiology ?CT HEAD WO CONTRAST ? ?Result Date: 04/05/2022 ?CLINICAL DATA:  Fall with altered mental status. EXAM: CT HEAD WITHOUT CONTRAST TECHNIQUE: Contiguous axial images were obtained from the base of the skull through the vertex without intravenous contrast. RADIATION DOSE REDUCTION: This exam was performed according to the departmental dose-optimization program which includes automated exposure control, adjustment of the mA and/or kV according to patient size and/or use of iterative reconstruction technique. COMPARISON:  Head CT 12/29/2021 FINDINGS: Brain: No acute intracranial hemorrhage. Stable degree of atrophy and periventricular chronic small vessel ischemia. There are remote lacunar infarcts in both basal ganglia in both caudate, left greater than right. Remote right cerebellar  infarct. No evidence of acute ischemia. There is no subdural or extra-axial collection. No hydrocephalus, midline shift or mass effect. Vascular: No hyperdense vessel. Skull: No fracture or focal lesion. Sinuses/O

## 2022-04-05 NOTE — Assessment & Plan Note (Signed)
Order echo, serial ECg and cycle troponin  ?In the setting of fever and dehydration in the setting of UTI and COVID infection  ?
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
## 2022-04-05 NOTE — ED Triage Notes (Signed)
Trauma Response Nurse Documentation ? ? ?Benjamin Cohen is a 66 y.o. male arriving to [facility] ED via [facility] ? ?On No antithrombotic. Trauma was activated as a Level 2 by chg RN based on the following trauma criteria GCS 10-14 associated with trauma or AVPU < A. Trauma team at the bedside on patient arrival. Patient cleared for CT by Dr. Lynelle Doctor. Patient to CT with team. GCS 14. ? ?History  ? Past Medical History:  ?Diagnosis Date  ? Diabetes mellitus without complication (HCC)   ? Hypertension   ? Stroke [facility])   ?  ? Past Surgical History:  ?Procedure Laterality Date  ? NO PAST SURGERIES    ?  ? ? ? ?Initial Focused Assessment (If applicable, or please see trauma documentation):  ? ?[place]-- clear ?Breathing -- spontaneous, unlabored ?Circulation - no bleeding noted, hematoma to forehead ?Is confused to events and unable to remember phone numbers ? ? ?CT's Completed:   ?CT Head and CT C-Spine  ? ?Interventions:  ?Labs,  ?Xrays ?CT scans ?Meds ? ? ?Bedside handoff with ED RN Fredric Mare, RN.   ? ?Hewitt Shorts  ?Trauma Response RN ? ?Please call TRN at [phone] for further assistance. ?  ?

## 2022-04-05 NOTE — ED Notes (Signed)
Pt's sister phyllis updated with permission from the pt to share pt information  ?

## 2022-04-05 NOTE — Assessment & Plan Note (Signed)
-   Order Sensitive   SSI  ?  ? -  check TSH and HgA1C ? - Hold by mouth medications  ?Uncontrolled , order diabetes coordinator consult ? ? ?

## 2022-04-05 NOTE — Progress Notes (Signed)
Orthopedic Tech Progress Note ?Patient Details:  ?Benjamin Cohen ?03/27/1956 ?[id number] ? ?Level 2 trauma  ? ?Patient ID: Benjamin Cohen, male   DOB: 1956-11-14, 65 y.o.   MRN: [id number] ? ?Benjamin Cohen ?04/05/2022, 6:35 PM ? ?
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
## 2022-04-05 NOTE — Assessment & Plan Note (Addendum)
hold Lipitor, given risk of rhabdo with paxlovid ?

## 2022-04-06 ENCOUNTER — Observation Stay (HOSPITAL_COMMUNITY): Payer: Medicare Other

## 2022-04-06 DIAGNOSIS — N390 Urinary tract infection, site not specified: Secondary | ICD-10-CM | POA: Insufficient documentation

## 2022-04-06 DIAGNOSIS — E1165 Type 2 diabetes mellitus with hyperglycemia: Secondary | ICD-10-CM | POA: Diagnosis present

## 2022-04-06 DIAGNOSIS — R531 Weakness: Secondary | ICD-10-CM | POA: Diagnosis not present

## 2022-04-06 DIAGNOSIS — U071 COVID-19: Secondary | ICD-10-CM | POA: Diagnosis present

## 2022-04-06 DIAGNOSIS — Z79899 Other long term (current) drug therapy: Secondary | ICD-10-CM | POA: Diagnosis not present

## 2022-04-06 DIAGNOSIS — Z8673 Personal history of transient ischemic attack (TIA), and cerebral infarction without residual deficits: Secondary | ICD-10-CM | POA: Diagnosis not present

## 2022-04-06 DIAGNOSIS — I160 Hypertensive urgency: Secondary | ICD-10-CM | POA: Diagnosis present

## 2022-04-06 DIAGNOSIS — R8290 Unspecified abnormal findings in urine: Secondary | ICD-10-CM | POA: Diagnosis present

## 2022-04-06 DIAGNOSIS — R778 Other specified abnormalities of plasma proteins: Secondary | ICD-10-CM

## 2022-04-06 DIAGNOSIS — Z91148 Patient's other noncompliance with medication regimen for other reason: Secondary | ICD-10-CM | POA: Diagnosis not present

## 2022-04-06 DIAGNOSIS — R55 Syncope and collapse: Secondary | ICD-10-CM | POA: Diagnosis present

## 2022-04-06 DIAGNOSIS — Z91138 Patient's unintentional underdosing of medication regimen for other reason: Secondary | ICD-10-CM | POA: Diagnosis not present

## 2022-04-06 DIAGNOSIS — R296 Repeated falls: Secondary | ICD-10-CM | POA: Diagnosis present

## 2022-04-06 DIAGNOSIS — G9349 Other encephalopathy: Secondary | ICD-10-CM | POA: Diagnosis present

## 2022-04-06 DIAGNOSIS — Z7984 Long term (current) use of oral hypoglycemic drugs: Secondary | ICD-10-CM | POA: Diagnosis not present

## 2022-04-06 DIAGNOSIS — E876 Hypokalemia: Secondary | ICD-10-CM | POA: Diagnosis present

## 2022-04-06 DIAGNOSIS — T466X6A Underdosing of antihyperlipidemic and antiarteriosclerotic drugs, initial encounter: Secondary | ICD-10-CM | POA: Diagnosis present

## 2022-04-06 DIAGNOSIS — R1310 Dysphagia, unspecified: Secondary | ICD-10-CM | POA: Diagnosis present

## 2022-04-06 DIAGNOSIS — E785 Hyperlipidemia, unspecified: Secondary | ICD-10-CM | POA: Diagnosis present

## 2022-04-06 DIAGNOSIS — R809 Proteinuria, unspecified: Secondary | ICD-10-CM | POA: Diagnosis present

## 2022-04-06 DIAGNOSIS — T465X6A Underdosing of other antihypertensive drugs, initial encounter: Secondary | ICD-10-CM | POA: Diagnosis present

## 2022-04-06 DIAGNOSIS — Z8616 Personal history of COVID-19: Secondary | ICD-10-CM | POA: Diagnosis not present

## 2022-04-06 DIAGNOSIS — I10 Essential (primary) hypertension: Secondary | ICD-10-CM | POA: Diagnosis present

## 2022-04-06 DIAGNOSIS — E860 Dehydration: Secondary | ICD-10-CM | POA: Diagnosis present

## 2022-04-06 DIAGNOSIS — R059 Cough, unspecified: Secondary | ICD-10-CM | POA: Diagnosis present

## 2022-04-06 LAB — I-STAT CHEM 8, ED
BUN: 12 mg/dL (ref 8–23)
Calcium, Ion: 0.9 mmol/L — ABNORMAL LOW (ref 1.15–1.40)
Chloride: 101 mmol/L (ref 98–111)
Creatinine, Ser: 1 mg/dL (ref 0.61–1.24)
Glucose, Bld: 421 mg/dL — ABNORMAL HIGH (ref 70–99)
HCT: 51 % (ref 39.0–52.0)
Hemoglobin: 17.3 g/dL — ABNORMAL HIGH (ref 13.0–17.0)
Potassium: 8.2 mmol/L (ref 3.5–5.1)
Sodium: 132 mmol/L — ABNORMAL LOW (ref 135–145)
TCO2: 24 mmol/L (ref 22–32)

## 2022-04-06 LAB — TROPONIN I (HIGH SENSITIVITY)
Troponin I (High Sensitivity): 97 ng/L — ABNORMAL HIGH (ref <18)
Troponin I (High Sensitivity): 92 ng/L — ABNORMAL HIGH (ref <18)
Troponin I (High Sensitivity): 66 ng/L — ABNORMAL HIGH (ref <18)
Troponin I (High Sensitivity): 60 ng/L — ABNORMAL HIGH (ref <18)

## 2022-04-06 LAB — CBC
HCT: 39.9 % (ref 39.0–52.0)
Hemoglobin: 13.3 g/dL (ref 13.0–17.0)
MCH: 27.8 pg (ref 26.0–34.0)
MCHC: 33.3 g/dL (ref 30.0–36.0)
MCV: 83.5 fL (ref 80.0–100.0)
Platelets: 155 10*3/uL (ref 150–400)
RBC: 4.78 MIL/uL (ref 4.22–5.81)
RDW: 12.6 % (ref 11.5–15.5)
WBC: 6.7 10*3/uL (ref 4.0–10.5)
nRBC: 0 % (ref 0.0–0.2)

## 2022-04-06 LAB — COMPREHENSIVE METABOLIC PANEL
ALT: 16 U/L (ref 0–44)
AST: 21 U/L (ref 15–41)
Albumin: 3 g/dL — ABNORMAL LOW (ref 3.5–5.0)
Alkaline Phosphatase: 82 U/L (ref 38–126)
Anion gap: 9 (ref 5–15)
BUN: 6 mg/dL — ABNORMAL LOW (ref 8–23)
CO2: 22 mmol/L (ref 22–32)
Calcium: 7.8 mg/dL — ABNORMAL LOW (ref 8.9–10.3)
Chloride: 108 mmol/L (ref 98–111)
Creatinine, Ser: 0.86 mg/dL (ref 0.61–1.24)
GFR, Estimated: >60 mL/min (ref >60)
Glucose, Bld: 157 mg/dL — ABNORMAL HIGH (ref 70–99)
Potassium: 3 mmol/L — ABNORMAL LOW (ref 3.5–5.1)
Sodium: 139 mmol/L (ref 135–145)
Total Bilirubin: 0.8 mg/dL (ref 0.3–1.2)
Total Protein: 6.2 g/dL — ABNORMAL LOW (ref 6.5–8.1)

## 2022-04-06 LAB — D-DIMER, QUANTITATIVE: D-Dimer, Quant: 0.72 ug/mL-FEU — ABNORMAL HIGH (ref 0.00–0.50)

## 2022-04-06 LAB — CBG MONITORING, ED
Glucose-Capillary: 160 mg/dL — ABNORMAL HIGH (ref 70–99)
Glucose-Capillary: 162 mg/dL — ABNORMAL HIGH (ref 70–99)
Glucose-Capillary: 201 mg/dL — ABNORMAL HIGH (ref 70–99)
Glucose-Capillary: 231 mg/dL — ABNORMAL HIGH (ref 70–99)
Glucose-Capillary: 249 mg/dL — ABNORMAL HIGH (ref 70–99)
Glucose-Capillary: 403 mg/dL — ABNORMAL HIGH (ref 70–99)

## 2022-04-06 LAB — HEPATIC FUNCTION PANEL
ALT: 15 U/L (ref 0–44)
AST: 21 U/L (ref 15–41)
Albumin: 3 g/dL — ABNORMAL LOW (ref 3.5–5.0)
Alkaline Phosphatase: 82 U/L (ref 38–126)
Bilirubin, Direct: 0.1 mg/dL (ref 0.0–0.2)
Indirect Bilirubin: 0.6 mg/dL (ref 0.3–0.9)
Total Bilirubin: 0.7 mg/dL (ref 0.3–1.2)
Total Protein: 6.1 g/dL — ABNORMAL LOW (ref 6.5–8.1)

## 2022-04-06 LAB — LIPID PANEL
Cholesterol: 194 mg/dL (ref 0–200)
HDL: 50 mg/dL (ref >40)
LDL Cholesterol: 119 mg/dL — ABNORMAL HIGH (ref 0–99)
Total CHOL/HDL Ratio: 3.9 RATIO
Triglycerides: 123 mg/dL (ref <150)
VLDL: 25 mg/dL (ref 0–40)

## 2022-04-06 LAB — ECHOCARDIOGRAM COMPLETE
AR max vel: 1.48 cm2
AV Area VTI: 1.54 cm2
AV Area mean vel: 1.43 cm2
AV Mean grad: 5 mmHg
AV Peak grad: 8.9 mmHg
Ao pk vel: 1.49 m/s
Area-P 1/2: 4.06 cm2
Calc EF: 40.7 %
Height: 68 in
S' Lateral: 3.01 cm
Single Plane A2C EF: 24.6 %
Single Plane A4C EF: 55.4 %
Weight: 3680 oz

## 2022-04-06 LAB — PROCALCITONIN: Procalcitonin: 0.13 ng/mL

## 2022-04-06 LAB — I-STAT VENOUS BLOOD GAS, ED
Acid-Base Excess: 1 mmol/L (ref 0.0–2.0)
Bicarbonate: 25.1 mmol/L (ref 20.0–28.0)
Calcium, Ion: 0.9 mmol/L — ABNORMAL LOW (ref 1.15–1.40)
HCT: 50 % (ref 39.0–52.0)
Hemoglobin: 17 g/dL (ref 13.0–17.0)
O2 Saturation: 85 %
Potassium: 8.2 mmol/L (ref 3.5–5.1)
Sodium: 131 mmol/L — ABNORMAL LOW (ref 135–145)
TCO2: 26 mmol/L (ref 22–32)
pCO2, Ven: 36.8 mmHg — ABNORMAL LOW (ref 44–60)
pH, Ven: 7.442 — ABNORMAL HIGH (ref 7.25–7.43)
pO2, Ven: 47 mmHg — ABNORMAL HIGH (ref 32–45)

## 2022-04-06 LAB — TSH: TSH: 1.66 u[IU]/mL (ref 0.350–4.500)

## 2022-04-06 LAB — LACTATE DEHYDROGENASE: LDH: 148 U/L (ref 98–192)

## 2022-04-06 LAB — C-REACTIVE PROTEIN: CRP: 18.6 mg/dL — ABNORMAL HIGH (ref <1.0)

## 2022-04-06 LAB — HEMOGLOBIN A1C
Hgb A1c MFr Bld: 13 % — ABNORMAL HIGH (ref 4.8–5.6)
Mean Plasma Glucose: 326.4 mg/dL

## 2022-04-06 LAB — GLUCOSE, CAPILLARY
Glucose-Capillary: 290 mg/dL — ABNORMAL HIGH (ref 70–99)
Glucose-Capillary: 198 mg/dL — ABNORMAL HIGH (ref 70–99)

## 2022-04-06 LAB — CK: Total CK: 271 U/L (ref 49–397)

## 2022-04-06 LAB — MAGNESIUM: Magnesium: 1.7 mg/dL (ref 1.7–2.4)

## 2022-04-06 LAB — PHOSPHORUS: Phosphorus: 2.3 mg/dL — ABNORMAL LOW (ref 2.5–4.6)

## 2022-04-06 LAB — PREALBUMIN: Prealbumin: 12.9 mg/dL — ABNORMAL LOW (ref 18–38)

## 2022-04-06 LAB — HIV ANTIBODY (ROUTINE TESTING W REFLEX): HIV Screen 4th Generation wRfx: NONREACTIVE

## 2022-04-06 LAB — FERRITIN: Ferritin: 268 ng/mL (ref 24–336)

## 2022-04-06 LAB — AMMONIA: Ammonia: 12 umol/L (ref 9–35)

## 2022-04-06 MED ORDER — QUETIAPINE FUMARATE 50 MG PO TABS
50.0000 mg | ORAL_TABLET | Freq: Every day | ORAL | Status: DC
  Administered 2022-04-06 – 2022-04-07 (×2): 50 mg via ORAL
  Filled 2022-04-06 (×3): qty 1

## 2022-04-06 MED ORDER — ATORVASTATIN CALCIUM 40 MG PO TABS
40.0000 mg | ORAL_TABLET | Freq: Every day | ORAL | Status: DC
  Administered 2022-04-06 – 2022-04-08 (×3): 40 mg via ORAL
  Filled 2022-04-06 (×3): qty 1

## 2022-04-06 MED ORDER — SODIUM CHLORIDE 0.9 % IV SOLN: 2.0000 g | INTRAVENOUS | Status: DC

## 2022-04-06 MED ORDER — LISINOPRIL 20 MG PO TABS
40.0000 mg | ORAL_TABLET | Freq: Every day | ORAL | Status: DC
  Administered 2022-04-06 – 2022-04-08 (×3): 40 mg via ORAL
  Filled 2022-04-06 (×3): qty 2

## 2022-04-06 MED ORDER — NIRMATRELVIR/RITONAVIR (PAXLOVID)TABLET
3.0000 | ORAL_TABLET | Freq: Two times a day (BID) | ORAL | Status: DC
  Administered 2022-04-06: 3 via ORAL
  Filled 2022-04-06: qty 30

## 2022-04-06 MED ORDER — ACETAMINOPHEN 650 MG RE SUPP
650.0000 mg | Freq: Four times a day (QID) | RECTAL | Status: DC | PRN
Start: 2022-04-06 — End: 2022-04-08

## 2022-04-06 MED ORDER — METOPROLOL TARTRATE 50 MG PO TABS
50.0000 mg | ORAL_TABLET | Freq: Two times a day (BID) | ORAL | Status: DC
  Administered 2022-04-06 – 2022-04-07 (×3): 50 mg via ORAL
  Filled 2022-04-06 (×2): qty 1
  Filled 2022-04-06: qty 2

## 2022-04-06 MED ORDER — ACETAMINOPHEN 325 MG PO TABS
650.0000 mg | ORAL_TABLET | Freq: Four times a day (QID) | ORAL | Status: DC | PRN
  Administered 2022-04-06 (×2): 650 mg via ORAL
  Filled 2022-04-06: qty 2

## 2022-04-06 MED ORDER — LABETALOL HCL 5 MG/ML IV SOLN: 10.0000 mg | INTRAVENOUS | Status: DC | PRN

## 2022-04-06 MED ORDER — SODIUM CHLORIDE 0.9 % IV SOLN
75.0000 mL/h | INTRAVENOUS | Status: DC
  Administered 2022-04-06 – 2022-04-08 (×3): 75 mL/h via INTRAVENOUS

## 2022-04-06 MED ORDER — ACETAMINOPHEN 325 MG PO TABS
ORAL_TABLET | ORAL | Status: AC
  Filled 2022-04-06: qty 2

## 2022-04-06 MED ORDER — AMLODIPINE BESYLATE 5 MG PO TABS
5.0000 mg | ORAL_TABLET | Freq: Every day | ORAL | Status: DC
Start: 2022-04-06 — End: 2022-04-06
  Administered 2022-04-06: 5 mg via ORAL
  Filled 2022-04-06: qty 1

## 2022-04-06 MED ORDER — MOLNUPIRAVIR EUA 200MG CAPSULE
4.0000 | ORAL_CAPSULE | Freq: Two times a day (BID) | ORAL | Status: DC
  Administered 2022-04-06 – 2022-04-08 (×5): 800 mg via ORAL
  Filled 2022-04-06: qty 4

## 2022-04-06 MED ORDER — ALBUTEROL SULFATE (2.5 MG/3ML) 0.083% IN NEBU: 2.5000 mg | INHALATION_SOLUTION | RESPIRATORY_TRACT | Status: DC | PRN

## 2022-04-06 MED ORDER — CLOPIDOGREL BISULFATE 75 MG PO TABS
75.0000 mg | ORAL_TABLET | Freq: Every day | ORAL | Status: DC
  Administered 2022-04-06 – 2022-04-08 (×3): 75 mg via ORAL
  Filled 2022-04-06 (×3): qty 1

## 2022-04-06 MED ORDER — POTASSIUM PHOSPHATES 15 MMOLE/5ML IV SOLN
30.0000 mmol | Freq: Once | INTRAVENOUS | Status: AC
  Administered 2022-04-06: 30 mmol via INTRAVENOUS
  Filled 2022-04-06: qty 10

## 2022-04-06 MED ORDER — FOLIC ACID 1 MG PO TABS
1.0000 mg | ORAL_TABLET | Freq: Every day | ORAL | Status: DC
  Administered 2022-04-06 – 2022-04-08 (×3): 1 mg via ORAL
  Filled 2022-04-06 (×3): qty 1

## 2022-04-06 MED ORDER — ENOXAPARIN SODIUM 60 MG/0.6ML IJ SOSY
50.0000 mg | PREFILLED_SYRINGE | INTRAMUSCULAR | Status: DC
  Administered 2022-04-06 – 2022-04-07 (×2): 50 mg via SUBCUTANEOUS
  Filled 2022-04-06: qty 0.6
  Filled 2022-04-06: qty 0.5
  Filled 2022-04-06: qty 0.6

## 2022-04-06 MED ORDER — HYDROCODONE-ACETAMINOPHEN 5-325 MG PO TABS: 1.0000 | ORAL_TABLET | ORAL | Status: DC | PRN

## 2022-04-06 MED ORDER — THIAMINE HCL 100 MG PO TABS
100.0000 mg | ORAL_TABLET | Freq: Every day | ORAL | Status: DC
  Administered 2022-04-06 – 2022-04-08 (×3): 100 mg via ORAL
  Filled 2022-04-06 (×3): qty 1

## 2022-04-06 MED ORDER — INSULIN GLARGINE-YFGN 100 UNIT/ML ~~LOC~~ SOLN
15.0000 [IU] | Freq: Every day | SUBCUTANEOUS | Status: DC
  Administered 2022-04-06 – 2022-04-07 (×2): 15 [IU] via SUBCUTANEOUS
  Filled 2022-04-06 (×2): qty 0.15

## 2022-04-06 MED ORDER — SODIUM CHLORIDE 0.9 % IV SOLN: 75.0000 mL/h | INTRAVENOUS | Status: DC

## 2022-04-06 MED ORDER — HYDRALAZINE HCL 25 MG PO TABS: 25.0000 mg | ORAL_TABLET | ORAL | Status: DC | PRN
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
## 2022-04-06 NOTE — Assessment & Plan Note (Addendum)
Repleted. °

## 2022-04-06 NOTE — Assessment & Plan Note (Signed)
COVID infection -incidental finding    ?No  URI symptoms no evidence of hypoxia ? CXR with no infiltrates ? ? CT count 27.7 ? ?Cycle Threshold >32 (indicates low levels of virus)  ?Recommend supportive care only  ?Paxlovid is recommended if Cycle Threshold < 32   ?Start paxlovid if no contraindications or drug interactions discussed with pharmacy ?Hold off on steroids no infiltrates or pulmonary complaints, no  hypoxia ?No indication for immunomodulators ?Obtain inflammatory markers ?  Supportive measures ?Avoid over aggressive fluid resuscitation ?Airborne precautions ? ?

## 2022-04-06 NOTE — Assessment & Plan Note (Addendum)
-   likely multifactorial in setting of decreased PO intake, covid related ?- s/p IVF  ?- echo shows EF 55-60%, no RWMA, moderate LVH, small circumferential pericardial effusion ?- remains stable  ?

## 2022-04-06 NOTE — Assessment & Plan Note (Signed)
History of dysphagia in the past we will have speech pathology evaluate to see if this is resolved ?

## 2022-04-06 NOTE — TOC Initial Note (Addendum)
Transition of Care (TOC) - Initial/Assessment Note  ? ? ?Patient Details  ?Name: Benjamin Cohen ?MRN: [id number] ?Date of Birth: May 30, 1956 ? ?Transition of Care (TOC) CM/SW Contact:    ?Cyndi Bender, RN ?Phone Number: ?04/06/2022, 3:57 PM ? ?Clinical Narrative:            ?Spoke to patient regarding medication noncompliance but patient didn't have anything to say. Patient agreed for [facility] to call wife, Benjamin Cohen. Left message with Benjamin Cohen to return my call. ? ?[phone] Spoke;to of ex-wife,Benjamin Cohen, who states patient has been confused lately. H lives alone and is still driving. Benjamin Cohen states the patient is in denial about his medical condition that is why he doesn't take his medications. The only family that could help him is a nephew, Benjamin Cohen [phone]. ?Benjamin Cohen thinks the patient needs a PCS aide to cook meals and help take care of him. RNCM sent [facility] emailed to review for Medicaid. RNCM notified Benjamin Cohen that at this point that kind of care would be a private duty aide which is out of pocket. ? ?TOC will continue to follow.  ?Expected Discharge Plan: [place] ?  ? ? ?Patient Goals and CMS Choice ?  ?  ?  ? ?Expected Discharge Plan and Services ?Expected Discharge Plan: [place] ?  ?  ?  ?  ?                ?  ?  ?  ?  ?  ?  ?  ?  ?  ?  ? ?Prior Living Arrangements/Services ?  ?  ?  ?       ?  ?  ?  ?  ? ?Activities of Daily Living ?  ?  ? ?Permission Sought/Granted ?  ?  ?   ?   ?   ?   ? ?Emotional Assessment ?  ?  ?  ?  ?  ?  ? ?Admission diagnosis:  Syncope [R55] ?Hyperglycemia [R73.9] ?Acute cystitis without hematuria [N30.00] ?Syncope, unspecified syncope type [R55] ?COVID [U07.1] ?Patient Active Problem List  ? Diagnosis Date Noted  ? UTI (urinary tract infection) 04/06/2022  ? Encephalopathy due to COVID-19 virus 04/06/2022  ? Elevated troponin 04/06/2022  ? Hypophosphatemia 04/06/2022  ? Syncope 04/05/2022  ? Diabetes mellitus type 2 in nonobese [facility])   ? Essential  hypertension   ? Metabolic encephalopathy   ? Delirium due to medical condition with behavioral disturbance   ? Debility 04/11/2019  ? Staphylococcal pneumonia ([place])   ? COVID-19 virus infection   ? Dysphagia   ? Pressure injury of skin 03/23/2019  ? Acute respiratory failure with hypoxia ([place]) 03/11/2019  ? Noncompliance   ? History of stroke   ? Cerebral thrombosis with cerebral infarction 04/29/2018  ? CVA (cerebral vascular accident) ([place]) 04/29/2018  ? HLD (hyperlipidemia) 01/04/2015  ? Hypokalemia 11/14/2013  ? Uncontrolled type 2 diabetes mellitus with hyperglycemia, without long-term current use of insulin ([place]) 11/14/2013  ? Cerebral infarction ([place]) 11/12/2013  ? Hypertensive urgency 11/12/2013  ? ?PCP:  Scifres, Dorothy, PA-C ?Pharmacy:   ?[facility], [address] ?[place] [address] ?Phone: [phone] Fax: [phone] ? ? ? ? ?Social Determinants of Health (SDOH) Interventions ?  ? ?Readmission Risk Interventions ?   ? View : No data to display.  ?  ?  ?  ? ? ? ?

## 2022-04-06 NOTE — Assessment & Plan Note (Signed)
-   possibly from covid but may also be from prior CVA as does not fully appear acute on assessment  ?- monitor mentation on treatment  ?

## 2022-04-06 NOTE — Progress Notes (Signed)
?Progress Note ? ? ? ?Benjamin Cohen   ?[id number]  ?DOB: 09/05/1956  ?DOA: 04/05/2022     0 ?PCP: Scifres, Dorothy, PA-C ? ?Initial CC: syncope; found down ? ?Hospital Course: ?Benjamin CAVENAUGH is a 66 y.o. male with PMH CVA, DMII, HTN, HLD, medication noncompliance who presented after being found down in a parking lot outside of a gas station.  He reports feeling dizzy and lightheaded just prior to falling but was unable to give further details beyond this.  He is also a poor historian at baseline.  He states he does live in [place].  He has been fasting the past several days for Ramadan but also states he has not been eating much after sundown either.  He could not really provide elaboration as to why either. ? ?On admission he underwent further work-up and was positive for COVID-19.  He did not have any hypoxia and only endorsed a mild cough for a handful of days.  He had no obvious reported sick contacts. ? ?Interval History:  ?Evaluated in the ER this morning.  He remains a poor historian and was unable to provide much collateral information.  He did endorse feeling dizzy and lightheaded just prior to falling.  Does not remember events thereafter. ?Denied any chest pain or shortness of breath in the ER.  States that he has had an ongoing cough for approximately 1 week prior to presentation.  No productive sputum. ? ?Assessment and Plan: ?* Syncope ?- likely multifactorial in setting of decreased PO intake, covid related ?- continue IVF  ?- follow up echo ? ?Elevated troponin ?- denies CP but trops elevated on admission; no new EKG changes (TWI noted on lateral leads present on prior as well) ?- given uncontrolled HTN and covid will ask for cardiology input if further workup needed ? ?COVID-19 virus infection ?- CT value 27 on admission ?- start molnupiravir ?- CXR relatively clear; no GGO or infiltrates; remains on RA as well ?- trend inflammatory markers ?- hold off on CTA for now unless dimer >3 ?-Lovenox  for DVT prophylaxis ?-No indication for steroids unless becomes hypoxic ? ?Uncontrolled type 2 diabetes mellitus with hyperglycemia, without long-term current use of insulin (HCC) ?- A1c 13% ?- continue SSI and CBG ?- start semglee ? ?HLD (hyperlipidemia) ?- LDL 119 ?- start back on statin ? ?History of stroke ?- continue plavix  ? ?Essential hypertension ?- Uncontrolled due to noncompliance most likely ?- Start lisinopril and will modify regimen as needed ? ?Dysphagia ?- swallowing adequately per bedside eval ?- continue diet ?- okay to d/c SLP eval ? ?Encephalopathy due to COVID-19 virus ?- possibly from covid but may also be from prior CVA as does not fully appear acute on assessment  ?- monitor mentation on treatment  ? ?Hypophosphatemia ?- Replete as needed ? ?Hypokalemia ?- Replete as needed ? ? ? ?Old records reviewed in assessment of this patient ? ?Antimicrobials: ?Molnupiravir 04/06/2022 >> current ? ?DVT prophylaxis:  ?enoxaparin (LOVENOX) injection 40 mg Start: 04/06/22 1415 ?SCDs Start: 04/06/22 0820 ? ? ?Code Status:   Code Status: Full Code ? ?Disposition Plan: Home in 2 to 3 days ?Status is: Observation ? ?Objective: ?Blood pressure 136/79, pulse 86, temperature (!) 100.9 ?F (38.3 ?C), temperature source Oral, resp. rate 20, height 5\' 8"  (1.727 m), weight 104.3 kg, SpO2 93 %.  ?Examination:  ?Physical Exam ?Constitutional:   ?   General: He is not in acute distress. ?   Comments: Disheveled adult man  resting in bed in no distress with obvious confusion  ?HENT:  ?   Head: Normocephalic and atraumatic.  ?   Mouth/Throat:  ?   Mouth: Mucous membranes are dry.  ?Eyes:  ?   Extraocular Movements: Extraocular movements intact.  ?Cardiovascular:  ?   Rate and Rhythm: Normal rate and regular rhythm.  ?   Heart sounds: Normal heart sounds.  ?Pulmonary:  ?   Effort: Pulmonary effort is normal. No respiratory distress.  ?   Breath sounds: Normal breath sounds. No wheezing.  ?Abdominal:  ?   General: Bowel sounds  are normal. There is no distension.  ?   Palpations: Abdomen is soft.  ?   Tenderness: There is no abdominal tenderness.  ?Musculoskeletal:     ?   General: Normal range of motion.  ?   Cervical back: Normal range of motion and neck supple.  ?Skin: ?   General: Skin is warm and dry.  ?Neurological:  ?   Comments: Slowed mentation.  Follows commands.  Moves all 4 extremities.  ?Psychiatric:     ?   Mood and Affect: Mood normal.     ?   Behavior: Behavior normal.  ?  ? ?Consultants:  ?Cardiology ? ?Procedures:  ? ? ?Data Reviewed: ?Results for orders placed or performed during the hospital encounter of 04/05/22 (from the past 24 hour(s))  ?CBG monitoring, ED     Status: Abnormal  ? Collection Time: 04/05/22  6:19 PM  ?Result Value Ref Range  ? Glucose-Capillary 403 (H) 70 - 99 mg/dL  ?Protime-INR     Status: None  ? Collection Time: 04/05/22  6:20 PM  ?Result Value Ref Range  ? Prothrombin Time 13.6 11.4 - 15.2 seconds  ? INR 1.0 0.8 - 1.2  ?APTT     Status: None  ? Collection Time: 04/05/22  6:20 PM  ?Result Value Ref Range  ? aPTT 30 24 - 36 seconds  ?CBC     Status: None  ? Collection Time: 04/05/22  6:20 PM  ?Result Value Ref Range  ? WBC 7.8 4.0 - 10.5 K/uL  ? RBC 5.75 4.22 - 5.81 MIL/uL  ? Hemoglobin 15.8 13.0 - 17.0 g/dL  ? HCT 47.3 39.0 - 52.0 %  ? MCV 82.3 80.0 - 100.0 fL  ? MCH 27.5 26.0 - 34.0 pg  ? MCHC 33.4 30.0 - 36.0 g/dL  ? RDW 12.5 11.5 - 15.5 %  ? Platelets 207 150 - 400 K/uL  ? nRBC 0.0 0.0 - 0.2 %  ?Differential     Status: None  ? Collection Time: 04/05/22  6:20 PM  ?Result Value Ref Range  ? Neutrophils Relative % 75 %  ? Neutro Abs 5.9 1.7 - 7.7 K/uL  ? Lymphocytes Relative 15 %  ? Lymphs Abs 1.2 0.7 - 4.0 K/uL  ? Monocytes Relative 8 %  ? Monocytes Absolute 0.6 0.1 - 1.0 K/uL  ? Eosinophils Relative 0 %  ? Eosinophils Absolute 0.0 0.0 - 0.5 K/uL  ? Basophils Relative 1 %  ? Basophils Absolute 0.0 0.0 - 0.1 K/uL  ? Immature Granulocytes 1 %  ? Abs Immature Granulocytes 0.04 0.00 - 0.07 K/uL   ?Comprehensive metabolic panel     Status: Abnormal  ? Collection Time: 04/05/22  6:20 PM  ?Result Value Ref Range  ? Sodium 137 135 - 145 mmol/L  ? Potassium 3.7 3.5 - 5.1 mmol/L  ? Chloride 103 98 - 111 mmol/L  ? CO2 22  22 - 32 mmol/L  ? Glucose, Bld 392 (H) 70 - 99 mg/dL  ? BUN 8 8 - 23 mg/dL  ? Creatinine, Ser 1.21 0.61 - 1.24 mg/dL  ? Calcium 8.2 (L) 8.9 - 10.3 mg/dL  ? Total Protein 6.8 6.5 - 8.1 g/dL  ? Albumin 3.5 3.5 - 5.0 g/dL  ? AST 29 15 - 41 U/L  ? ALT 20 0 - 44 U/L  ? Alkaline Phosphatase 96 38 - 126 U/L  ? Total Bilirubin 1.3 (H) 0.3 - 1.2 mg/dL  ? GFR, Estimated >60 >60 mL/min  ? Anion gap 12 5 - 15  ?Ethanol     Status: None  ? Collection Time: 04/05/22  6:22 PM  ?Result Value Ref Range  ? Alcohol, Ethyl (B) <10 <10 mg/dL  ?I-stat chem 8, ED     Status: Abnormal  ? Collection Time: 04/05/22  6:57 PM  ?Result Value Ref Range  ? Sodium 132 (L) 135 - 145 mmol/L  ? Potassium 8.2 (HH) 3.5 - 5.1 mmol/L  ? Chloride 101 98 - 111 mmol/L  ? BUN 12 8 - 23 mg/dL  ? Creatinine, Ser 1.00 0.61 - 1.24 mg/dL  ? Glucose, Bld 421 (H) 70 - 99 mg/dL  ? Calcium, Ion 0.90 (L) 1.15 - 1.40 mmol/L  ? TCO2 24 22 - 32 mmol/L  ? Hemoglobin 17.3 (H) 13.0 - 17.0 g/dL  ? HCT 51.0 39.0 - 52.0 %  ? Comment NOTIFIED PHYSICIAN   ?I-Stat venous blood gas, [facility] ED only)     Status: Abnormal  ? Collection Time: 04/05/22  6:57 PM  ?Result Value Ref Range  ? pH, Ven 7.442 (H) 7.25 - 7.43  ? pCO2, Ven 36.8 (L) 44 - 60 mmHg  ? pO2, Ven 47 (H) 32 - 45 mmHg  ? Bicarbonate 25.1 20.0 - 28.0 mmol/L  ? TCO2 26 22 - 32 mmol/L  ? O2 Saturation 85 %  ? Acid-Base Excess 1.0 0.0 - 2.0 mmol/L  ? Sodium 131 (L) 135 - 145 mmol/L  ? Potassium 8.2 (HH) 3.5 - 5.1 mmol/L  ? Calcium, Ion 0.90 (L) 1.15 - 1.40 mmol/L  ? HCT 50.0 39.0 - 52.0 %  ? Hemoglobin 17.0 13.0 - 17.0 g/dL  ? Sample type VENOUS   ? Comment NOTIFIED PHYSICIAN   ?Blood culture (routine x 2)     Status: None (Preliminary result)  ? Collection Time: 04/05/22  7:00 PM  ? Specimen: BLOOD  ?Result  Value Ref Range  ? Specimen Description BLOOD RIGHT ANTECUBITAL   ? Special Requests    ?  BOTTLES DRAWN AEROBIC AND ANAEROBIC Blood Culture results may not be optimal due to an excessive volume of blo

## 2022-04-06 NOTE — Assessment & Plan Note (Addendum)
-   denies CP but trops elevated on admission; no new EKG changes (TWI noted on lateral leads present on prior as well) ?- given uncontrolled HTN and covid will ask for cardiology input if further workup needed ?-Appreciate cardiology involvement, recommendation is for outpatient follow-up for further ischemic testing/stress testing after resolution of COVID ?- trops have peaked at 97 and downtrended  ?

## 2022-04-06 NOTE — Assessment & Plan Note (Signed)
-   treat with Rocephin         await results of urine culture and adjust antibiotic coverage as needed  

## 2022-04-06 NOTE — Assessment & Plan Note (Addendum)
-   A1c 13% ?- continue SSI and CBG ?- start semglee; adjusting as needed ?-Discharged with Lantus FlexPen and Humalog pen ?

## 2022-04-06 NOTE — Assessment & Plan Note (Signed)
-   continue plavix 

## 2022-04-06 NOTE — Assessment & Plan Note (Signed)
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
## 2022-04-06 NOTE — Assessment & Plan Note (Addendum)
-   Uncontrolled due to noncompliance most likely ?- Start lisinopril and will modify regimen as needed ?- increase lopressor ?- start amlodipine  ?-Prescriptions for lisinopril, Lopressor, amlodipine provided at discharge ?

## 2022-04-06 NOTE — Assessment & Plan Note (Signed)
-   LDL 119 ?- start back on statin ?

## 2022-04-06 NOTE — Assessment & Plan Note (Addendum)
-   CT value 27 on admission ?- continue molnupiravir to complete course  ?- CXR relatively clear; no GGO or infiltrates; remains on RA as well ?- trend inflammatory markers ?- hold off on CTA for now unless dimer >3 (downtrending and remains on RA, very low suspicion for PE). Well's score 1.5 pts and low risk as well ?-Lovenox for DVT prophylaxis while in hospital  ?

## 2022-04-06 NOTE — ED Notes (Signed)
Spoke with lab, to add on nightshift labs. MD made aware ?

## 2022-04-06 NOTE — Assessment & Plan Note (Signed)
In 2020 patient had severe case of COVID requiring intubation thereafter he had significant encephalopathy at the time of discharge she was needing to have supervision at this point patient lives alone it is unclear if he is having any supervision he continues to have some apparent deficits in insight.  Would benefit from PT OT assessment for safety and possibly placement ?

## 2022-04-06 NOTE — Progress Notes (Addendum)
Inpatient Diabetes Program Recommendations ? ?AACE/ADA: New Consensus Statement on Inpatient Glycemic Control (2015) ? ?Target Ranges:  Prepandial:   less than 140 mg/dL ?     Peak postprandial:   less than 180 mg/dL (1-2 hours) ?     Critically ill patients:  140 - 180 mg/dL  ? ?Lab Results  ?Component Value Date  ? GLUCAP 231 (H) 04/06/2022  ? HGBA1C 13.0 (H) 04/05/2022  ? ? ?Review of Glycemic Control ? Latest Reference Range & Units 04/05/22 18:19 04/05/22 22:11 04/06/22 00:07 04/06/22 01:28 04/06/22 04:55 04/06/22 08:07 04/06/22 11:43  ?Glucose-Capillary 70 - 99 mg/dL 403 (H) 339 (H) 249 (H) 201 (H) 160 (H) 162 (H) 231 (H)  ? ?Diabetes history: DM 2 ?Outpatient Diabetes medications: none, Amaryl 2 Daily in the past ?Current orders for Inpatient glycemic control:  ?Novolog 0-15 units Q4 ? ?A1c 13% ? ?Spoke with pt regarding A1c and glucose control at home. Pt reports he has not taken his medications in about a year. Pt reports just never getting a refill for it. Pt reported being prescribed insulin when discharged 2 years ago with COVID diagnosis but never took the insulin at home. Discussed glucose and A1c goals. Discussed pts current A1c level. Introduced the idea of insulin use at home. Encouraged glucose checks everyday. Pt is unsure about insulin at this time. Pt asks if he can control glucose levels with just oral pills. Encouraged pt to speak with Hospitalist. Also mentioned it may be difficult to control the level of A1c with just oral medications. ? ?Will follow up with pt. Follow glucose trends today.  ? ?Supplies needed at d/c: ?Blood glucose meter kit order # [id number] ? ?Thanks, ? ?Tama Headings RN, MSN, BC-ADM ?Inpatient Diabetes Coordinator ?Team Pager [phone] (8a-5p) ?

## 2022-04-06 NOTE — Progress Notes (Signed)
RT instructed patient on the use of a flutter valve and incentive spirometer. The patient was able to demonstrate back proper technique.   ?

## 2022-04-06 NOTE — Assessment & Plan Note (Addendum)
-   swallowing adequately per bedside eval ?- continue diet ?
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
## 2022-04-06 NOTE — Assessment & Plan Note (Signed)
Resume Norvasc and metoprolol if blood pressure allows ?

## 2022-04-07 ENCOUNTER — Telehealth: Payer: Self-pay | Admitting: Cardiology

## 2022-04-07 LAB — COMPREHENSIVE METABOLIC PANEL
ALT: 22 U/L (ref 0–44)
AST: 25 U/L (ref 15–41)
Albumin: 2.7 g/dL — ABNORMAL LOW (ref 3.5–5.0)
Alkaline Phosphatase: 86 U/L (ref 38–126)
Anion gap: 7 (ref 5–15)
BUN: 10 mg/dL (ref 8–23)
CO2: 23 mmol/L (ref 22–32)
Calcium: 7.8 mg/dL — ABNORMAL LOW (ref 8.9–10.3)
Chloride: 106 mmol/L (ref 98–111)
Creatinine, Ser: 1.06 mg/dL (ref 0.61–1.24)
GFR, Estimated: >60 mL/min (ref >60)
Glucose, Bld: 194 mg/dL — ABNORMAL HIGH (ref 70–99)
Potassium: 3 mmol/L — ABNORMAL LOW (ref 3.5–5.1)
Sodium: 136 mmol/L (ref 135–145)
Total Bilirubin: 0.8 mg/dL (ref 0.3–1.2)
Total Protein: 5.8 g/dL — ABNORMAL LOW (ref 6.5–8.1)

## 2022-04-07 LAB — GLUCOSE, CAPILLARY
Glucose-Capillary: 117 mg/dL — ABNORMAL HIGH (ref 70–99)
Glucose-Capillary: 199 mg/dL — ABNORMAL HIGH (ref 70–99)
Glucose-Capillary: 219 mg/dL — ABNORMAL HIGH (ref 70–99)
Glucose-Capillary: 223 mg/dL — ABNORMAL HIGH (ref 70–99)
Glucose-Capillary: 236 mg/dL — ABNORMAL HIGH (ref 70–99)
Glucose-Capillary: 273 mg/dL — ABNORMAL HIGH (ref 70–99)
Glucose-Capillary: 305 mg/dL — ABNORMAL HIGH (ref 70–99)

## 2022-04-07 LAB — CBC WITH DIFFERENTIAL/PLATELET
Abs Immature Granulocytes: 0 10*3/uL (ref 0.00–0.07)
Basophils Absolute: 0 10*3/uL (ref 0.0–0.1)
Basophils Relative: 1 %
Eosinophils Absolute: 0 10*3/uL (ref 0.0–0.5)
Eosinophils Relative: 1 %
HCT: 40.1 % (ref 39.0–52.0)
Hemoglobin: 13.1 g/dL (ref 13.0–17.0)
Immature Granulocytes: 0 %
Lymphocytes Relative: 23 %
Lymphs Abs: 1 10*3/uL (ref 0.7–4.0)
MCH: 27.3 pg (ref 26.0–34.0)
MCHC: 32.7 g/dL (ref 30.0–36.0)
MCV: 83.5 fL (ref 80.0–100.0)
Monocytes Absolute: 0.6 10*3/uL (ref 0.1–1.0)
Monocytes Relative: 14 %
Neutro Abs: 2.7 10*3/uL (ref 1.7–7.7)
Neutrophils Relative %: 61 %
Platelets: 152 10*3/uL (ref 150–400)
RBC: 4.8 MIL/uL (ref 4.22–5.81)
RDW: 12.7 % (ref 11.5–15.5)
WBC: 4.4 10*3/uL (ref 4.0–10.5)
nRBC: 0 % (ref 0.0–0.2)

## 2022-04-07 LAB — URINE CULTURE: Culture: <10000 — AB

## 2022-04-07 LAB — C-REACTIVE PROTEIN: CRP: 18.7 mg/dL — ABNORMAL HIGH (ref <1.0)

## 2022-04-07 LAB — PROCALCITONIN: Procalcitonin: 0.12 ng/mL

## 2022-04-07 LAB — TROPONIN I (HIGH SENSITIVITY): Troponin I (High Sensitivity): 58 ng/L — ABNORMAL HIGH (ref <18)

## 2022-04-07 LAB — D-DIMER, QUANTITATIVE: D-Dimer, Quant: 0.32 ug/mL-FEU (ref 0.00–0.50)

## 2022-04-07 LAB — FERRITIN: Ferritin: 272 ng/mL (ref 24–336)

## 2022-04-07 LAB — PHOSPHORUS: Phosphorus: 2.6 mg/dL (ref 2.5–4.6)

## 2022-04-07 LAB — MAGNESIUM: Magnesium: 1.8 mg/dL (ref 1.7–2.4)

## 2022-04-07 LAB — LACTATE DEHYDROGENASE: LDH: 134 U/L (ref 98–192)

## 2022-04-07 MED ORDER — METOPROLOL TARTRATE 25 MG PO TABS
75.0000 mg | ORAL_TABLET | Freq: Two times a day (BID) | ORAL | Status: DC
  Administered 2022-04-07 – 2022-04-08 (×2): 75 mg via ORAL
  Filled 2022-04-07 (×2): qty 1

## 2022-04-07 MED ORDER — ADULT MULTIVITAMIN W/MINERALS CH
1.0000 | ORAL_TABLET | Freq: Every day | ORAL | Status: DC
  Administered 2022-04-07 – 2022-04-08 (×2): 1 via ORAL
  Filled 2022-04-07 (×2): qty 1

## 2022-04-07 MED ORDER — AMLODIPINE BESYLATE 5 MG PO TABS
5.0000 mg | ORAL_TABLET | Freq: Every day | ORAL | Status: DC
  Administered 2022-04-07: 5 mg via ORAL
  Filled 2022-04-07: qty 1

## 2022-04-07 MED ORDER — POTASSIUM CHLORIDE CRYS ER 20 MEQ PO TBCR
40.0000 meq | EXTENDED_RELEASE_TABLET | ORAL | Status: AC
  Administered 2022-04-07 (×2): 40 meq via ORAL
  Filled 2022-04-07 (×2): qty 2

## 2022-04-07 MED ORDER — INSULIN GLARGINE-YFGN 100 UNIT/ML ~~LOC~~ SOLN
18.0000 [IU] | Freq: Every day | SUBCUTANEOUS | Status: DC
  Administered 2022-04-08: 18 [IU] via SUBCUTANEOUS
  Filled 2022-04-07: qty 0.18

## 2022-04-07 NOTE — Progress Notes (Signed)
Initial Nutrition Assessment ? ?DOCUMENTATION CODES:  ? ?Not applicable ? ?INTERVENTION:  ? ?Multivitamin w/ minerals daily ?Diet education  ?Liberalize pt diet to regular due to increased needs  ?Encourage good PO intake  ?Meal ordering with assist ? ?NUTRITION DIAGNOSIS:  ? ?Increased nutrient needs related to acute illness as evidenced by estimated needs. ? ?GOAL:  ? ?Patient will meet greater than or equal to 90% of their needs ? ?MONITOR:  ? ?PO intake, Labs, Weight trends, I & O's ? ?REASON FOR ASSESSMENT:  ? ?Consult ?Assessment of nutrition requirement/status ? ?ASSESSMENT:  ? ?66 y.o. male presented to the ED after syncope and found down in a parking lot. PMH includes CVA, T2DM, HTN, and dysphagia. Pt admitted with syncope, COVID-19, and uncontrolled T2DM.  ? ?Pt reports that his appetite has been good PTA and currently. States that he typically eats a [place] sandwich for lunch and chicken and peas for dinner; drink of choice is Sweet tea. Pt denies any nausea or vomiting. Reports that he fast and cannot eat three meals per day.  ? ?Pt denies any recent weight loss. Per EMR, pt has not has any recent weight loss.  ? ?Discussed reducing sweet tea and supplementing with completely unsweet tea or using artifical sweeteners in tea. Encouraged pt to eat 3 balanced meals per day. Informed pt that handout will be added to discharge instructions.  ? ?Medications reviewed and include: Folic acid, SSI 0-15 units N8G, Semglee, Potassium Chloride, Thiamine  ?Labs reviewed: Potassium 3, CRP 18.7, HGB A1c 13%, 24 hr CBG  198-304 ? ?NUTRITION - FOCUSED PHYSICAL EXAM: ? ?Flowsheet Row Most Recent Value  ?Orbital Region No depletion  ?Upper Arm Region No depletion  ?Thoracic and Lumbar Region No depletion  ?Buccal Region No depletion  ?Temple Region No depletion  ?Clavicle Bone Region No depletion  ?Clavicle and Acromion Bone Region No depletion  ?Scapular Bone Region No depletion  ?Dorsal Hand No depletion  ?Patellar  Region No depletion  ?Anterior Thigh Region No depletion  ?Posterior Calf Region No depletion  ?Edema (RD Assessment) None  ?Hair Reviewed  ?Eyes Reviewed  ?Mouth Reviewed  ?Skin Reviewed  ?Nails Reviewed  ? ?Diet Order:   ?Diet Order   ? ?       ?  Diet Heart Room service appropriate? Yes; Fluid consistency: Thin  Diet effective now       ?  ? ?  ?  ? ?  ? ? ?EDUCATION NEEDS:  ? ?Education needs have been addressed ? ?Skin:  Skin Assessment: Reviewed RN Assessment ? ?Last BM:  4/27 ? ?Height:  ? ?Ht Readings from Last 1 Encounters:  ?04/05/22 5\' 8"  (1.727 m)  ? ? ?Weight:  ? ?Wt Readings from Last 1 Encounters:  ?04/05/22 104.3 kg  ? ? ?Ideal Body Weight:  70 kg ? ?BMI:  Body mass index is 34.97 kg/m?. ? ?Estimated Nutritional Needs:  ? ?Kcal:  2200-2400 ? ?Protein:  110-125 grams ? ?Fluid:  >/= 2 L ? ? ? ?04/07/22 RD, LDN ?Clinical Dietitian ?See [facility] for contact information.  ? ?

## 2022-04-07 NOTE — Progress Notes (Signed)
Inpatient Diabetes Program Recommendations ? ?AACE/ADA: New Consensus Statement on Inpatient Glycemic Control  ? ?Target Ranges:  Prepandial:   less than 140 mg/dL ?     Peak postprandial:   less than 180 mg/dL (1-2 hours) ?     Critically ill patients:  140 - 180 mg/dL  ? ? Latest Reference Range & Units 04/07/22 00:02 04/07/22 03:48 04/07/22 09:10  ?Glucose-Capillary 70 - 99 mg/dL 778 (H) 242 (H) 353 (H)  ? ? Latest Reference Range & Units 04/06/22 08:07 04/06/22 11:43 04/06/22 15:35 04/06/22 19:50  ?Glucose-Capillary 70 - 99 mg/dL 614 (H) 431 (H) 540 (H) 198 (H)  ? ?Review of Glycemic Control ? ?Diabetes history: DM2 ?Outpatient Diabetes medications: Prescribed Amaryl and Metformin in the past  ?Current orders for Inpatient glycemic control: Semglee 15 units daily and Novolog 0-15 units Q4H ? ?Inpatient Diabetes Program Recommendations:   ? ?Insulin: Please consider increasing Semglee to 20 units daily and Novolog 4 units TID with meals for meal coverage if patient eats at least 50% of meals. ? ?NOTE: Diabetes coordinator spoke with patient on 04/06/22 regarding A1C of 13%. Patient reported he stopped taking DM medication about 1 year ago. Patient reported that he was prescribed insulin 2 years ago but he never started taking insulin outpatient. Patient stated he was unsure of taking insulin outpatient and stated he would like to use oral DM medication. Patient was encouraged to talk with attending provider about outpatient plan for DM control. Placed order for bedside nursing to work with patient on insulin administration in case he is discharged on insulin. ?  ?Thanks, ?Orlando Penner, RN, MSN, CDE ?Diabetes Coordinator ?Inpatient Diabetes Program ?[phone] (Team Pager from 8am to 5pm) ? ? ? ?
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
## 2022-04-07 NOTE — Progress Notes (Addendum)
Subjective:  ?Patient seen and examined at bedside at approximately 8:30 AM ?Patient resting in bed.  Denies chest pain.  ?Patient does report shortness of breath and continued cough. ?Patient with limited participation in conversation, provides one-word answers to questions.  Will answer yes or no questions. ?Patient states his nephew Jermaine Palencia is best to contact with updates with patient condition.  Patient agrees that we may contact Jermaine Saban.  ? ?Intake/Output from previous day: ? ?I/O last 3 completed shifts: ?In: -  ?Out: 700 [Urine:700] ?No intake/output data recorded. ? ?Blood pressure (!) 154/98, pulse (!) 105, temperature (!) 97.5 ?F (36.4 ?C), temperature source Axillary, resp. rate 18, height 5' 8" (1.727 m), weight 104.3 kg, SpO2 94 %. ? ?CONSTITUTIONAL: Age-appropriate, poor historian, minimal participation in conversation, hemodynamically stable, no acute distress ?SKIN: Skin is warm and dry. No rash noted. No cyanosis. No pallor. No jaundice ?HEAD: Normocephalic and atraumatic.  ?EYES: No scleral icterus ?MOUTH/THROAT: Dry oral membranes.  ?NECK: No JVD present. No thyromegaly noted. No carotid bruits  ?CHEST Normal respiratory effort. No intercostal retractions  ?LUNGS: Bilateral rhonchi throughout.  No stridor. No wheezes. No rales.  ?CARDIOVASCULAR: Regular rate and rhythm, positive S1-S2, no murmurs rubs or gallops appreciated ?ABDOMINAL: Obese, soft, nontender, nondistended, positive bowel sounds in all 4 quadrants, no apparent ascites.  ?EXTREMITIES: No pitting edema, warm to touch.  ?HEMATOLOGIC: No significant bruising ?NEUROLOGIC: Oriented to person, place, and time. Nonfocal. Normal muscle tone.  ?PSYCHIATRIC: Flat affect.  Cooperative.  Follows commands but slowly. ? ?Lab Results: ?BMP ?BNP (last 3 results) ?No results for input(s): BNP in the last 8760 hours. ? ?ProBNP (last 3 results) ?No results for input(s): PROBNP in the last 8760 hours. ? ?  Latest Ref Rng & Units 04/07/2022   ?  8:32 AM 04/06/2022  ?  8:21 AM 04/05/2022  ?  7:11 PM  ?BMP  ?Glucose 70 - 99 mg/dL 194   157   407    ?BUN 8 - 23 mg/dL 10   6   9    ?Creatinine 0.61 - 1.24 mg/dL 1.06   0.86   1.00    ?Sodium 135 - 145 mmol/L 136   139   137    ?Potassium 3.5 - 5.1 mmol/L 3.0   3.0   3.7    ?Chloride 98 - 111 mmol/L 106   108   101    ?CO2 22 - 32 mmol/L 23   22     ?Calcium 8.9 - 10.3 mg/dL 7.8   7.8     ? ? ?  Latest Ref Rng & Units 04/07/2022  ?  8:32 AM 04/06/2022  ?  8:21 AM 04/05/2022  ?  6:20 PM  ?Hepatic Function  ?Total Protein 6.5 - 8.1 g/dL 5.8   6.1    ? 6.2   6.8    ?Albumin 3.5 - 5.0 g/dL 2.7   3.0    ? 3.0   3.5    ?AST 15 - 41 U/L 25   21    ? 21   29    ?ALT 0 - 44 U/L 22   15    ? 16   20    ?Alk Phosphatase 38 - 126 U/L 86   82    ? 82   96    ?Total Bilirubin 0.3 - 1.2 mg/dL 0.8   0.7    ? 0.8   1.3    ?Bilirubin, Direct 0.0 - 0.2   mg/dL  0.1     ? ? ?  Latest Ref Rng & Units 04/07/2022  ?  8:32 AM 04/06/2022  ?  3:33 PM 04/05/2022  ?  7:11 PM  ?CBC  ?WBC 4.0 - 10.5 K/uL 4.4   6.7     ?Hemoglobin 13.0 - 17.0 g/dL 13.1   13.3   16.7    ?Hematocrit 39.0 - 52.0 % 40.1   39.9   49.0    ?Platelets 150 - 400 K/uL 152   155     ? ?Lipid Panel  ?   ?Component Value Date/Time  ? CHOL 194 04/06/2022 0250  ? CHOL 216 (H) 01/04/2015 1449  ? TRIG 123 04/06/2022 0250  ? HDL 50 04/06/2022 0250  ? HDL 42 01/04/2015 1449  ? CHOLHDL 3.9 04/06/2022 0250  ? VLDL 25 04/06/2022 0250  ? LDLCALC 119 (H) 04/06/2022 0250  ? LDLCALC 143 (H) 01/04/2015 1449  ? ?Cardiac Panel (last 3 results) ?Recent Labs  ?  04/06/22 ?0821  ?CKTOTAL 271  ? ? ?HEMOGLOBIN A1C ?Lab Results  ?Component Value Date  ? HGBA1C 13.0 (H) 04/05/2022  ? MPG 326.4 04/05/2022  ? ?TSH ?Recent Labs  ?  04/06/22 ?0821  ?TSH 1.660  ? ?Imaging: ?CT HEAD WO CONTRAST 04/05/2022 ? 1. No acute intracranial abnormality. No skull fracture.  ?2. Stable atrophy, chronic small vessel ischemia, and remote lacunar infarcts.  ? ?CT Cervical Spine Wo Contrast 04/05/2022 ? Multilevel  degenerative disc disease and facet hypertrophy. No acute fracture or traumatic subluxation of the cervical spine.  ? ?DG Chest Portable 1 View 04/05/2022 ? 1. No acute cardiopulmonary abnormalities. ?Stable cardiomediastinal contours. No pleural effusion or edema ?identified. No airspace consolidation. Visualized osseous structures ?are intact.   ? ?Cardiac Studies: ? ?EKG: ?April 05, 2022: Sinus tachycardia, 127 bpm, left axis, left anterior fascicular block, LVH per voltage criteria, without underlying injury pattern. ?  ?April 06, 2022: Sinus tachycardia, 101 bpm, left axis, left anterior fascicular block, TWI in the lateral leads cannot rule out ischemia.  Compared to prior EKGs TWI are not new.  ?  ?April 06, 2022: Normal sinus rhythm, 75 bpm, left axis deviation, left anterior fascicular block lateral T wave inversions cannot rule out ischemia.  Compared to prior EKGs ventricular rate has improved. ?  ?Echocardiogram: ?04/06/2022: ? 1. Left ventricular ejection fraction, by estimation, is 55 to 60%. The left ventricle has normal function. The left ventricle has no regional wall motion abnormalities. There is moderate left ventricular hypertrophy. Left ventricular diastolic parameters are indeterminate. Elevated left atrial pressure. The average left ventricular global longitudinal strain is -10.6 %. The global longitudinal strain is abnormal.  ? 2. Right ventricular systolic function is normal. The right ventricular size is normal.  ? 3. A small pericardial effusion is present. The pericardial effusion is circumferential. There is no evidence of cardiac tamponade.  ? 4. The mitral valve is grossly normal. No evidence of mitral valve regurgitation. No evidence of mitral stenosis.  ? 5. The aortic valve is tricuspid. Aortic valve regurgitation is not visualized. No aortic stenosis is present.  ? 6. The inferior vena cava is normal in size with greater than 50% respiratory variability, suggesting right atrial  pressure of 3 mmHg.  ? ? ?Scheduled Meds: ? atorvastatin  40 mg Oral Daily  ? clopidogrel  75 mg Oral Daily  ? enoxaparin (LOVENOX) injection  50 mg Subcutaneous T26Z  ? folic acid  1 mg Oral Daily  ? insulin  aspart  0-15 Units Subcutaneous Q4H  ? insulin glargine-yfgn  15 Units Subcutaneous Daily  ? lisinopril  40 mg Oral Daily  ? metoprolol tartrate  50 mg Oral BID  ? molnupiravir EUA  4 capsule Oral BID  ? potassium chloride  40 mEq Oral Q4H  ? QUEtiapine  50 mg Oral QHS  ? thiamine  100 mg Oral Daily  ? ?Continuous Infusions: ? sodium chloride 75 mL/hr (04/06/22 2320)  ? ?PRN Meds:.acetaminophen **OR** acetaminophen, albuterol, hydrALAZINE, HYDROcodone-acetaminophen, labetalol ? ?Assessment/Plan:  ?Patient remains chest pain-free.  Although troponins mildly elevated, essentially flat and trending down, not suggestive of ACS but rather likely supply demand ischemia in the setting of COVID-19 section with tachycardia and tachypnea.  Echocardiogram notes preserved LVEF without regional wall motion abnormalities.  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 ? ?Have discussed patient case with patient's nephew Karlene Einstein at his request.  Patient has also requested that we discussed care with his sister, Will work on contacting patient's sister.  ? ?In regard to comorbid conditions, continue blood pressure management per primary team, continue statin therapy.  Will defer further management of patient's chronic comorbid conditions and active problem list to primary team. ? ?Further recommendations regarding timing of ischemic evaluation pending discussion with patient's family given concern that patient has a history of noncompliance. Did advise patient  regarding the importance of outpatient follow with cardiology.  ? ?Patient was seen in collaboration with Dr. Terri Skains.  ? ? ?Alethia Berthold, PA-C ?04/07/2022, 10:03 AM ?Office: [phone] ? ?Addendum 04/07/2022:  ?I

## 2022-04-07 NOTE — Telephone Encounter (Signed)
Called pt for TOC, no answer. Left VM requesting call back.

## 2022-04-07 NOTE — Telephone Encounter (Signed)
Patient needs TOC per ST - TOC 2 week s/p hospitalization. ?

## 2022-04-07 NOTE — Discharge Instructions (Signed)
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
## 2022-04-07 NOTE — TOC Progression Note (Addendum)
Transition of Care (TOC) - Progression Note  ? ? ?Patient Details  ?Name: Benjamin Cohen ?MRN: [id number] ?Date of Birth: November 24, 1956 ? ?Transition of Care (TOC) CM/SW Contact  ?Lockie Pares, RN ?Phone Number: ?04/07/2022, 3:32 PM ? ?Clinical Narrative:    ? ?Patient still lethargic, answering  yes and no to questions.  ?Will need PT OT Aide, and RN for medication and disease management. Messaged Amy from [facility] to see if they could accept. They do not have the staffing at this time.  ?Reached out to [facility] . - She can accept and will be following patient throughout stay  for DC needs. ? ? ?Expected Discharge Plan: Home w Home Health Services ?  ? ?Expected Discharge Plan and Services ?Expected Discharge Plan: Home w Home Health Services ?  ?  ?  ?  ?                ?  ?  ?  ?  ?  ?  ?  ?  ?  ?  ? ? ?Social Determinants of Health (SDOH) Interventions ?  ? ?Readmission Risk Interventions ?   ? View : No data to display.  ?  ?  ?  ? ? ?

## 2022-04-08 LAB — COMPREHENSIVE METABOLIC PANEL
ALT: 20 U/L (ref 0–44)
AST: 25 U/L (ref 15–41)
Albumin: 2.6 g/dL — ABNORMAL LOW (ref 3.5–5.0)
Alkaline Phosphatase: 86 U/L (ref 38–126)
Anion gap: 6 (ref 5–15)
BUN: 8 mg/dL (ref 8–23)
CO2: 22 mmol/L (ref 22–32)
Calcium: 7.9 mg/dL — ABNORMAL LOW (ref 8.9–10.3)
Chloride: 110 mmol/L (ref 98–111)
Creatinine, Ser: 0.94 mg/dL (ref 0.61–1.24)
GFR, Estimated: >60 mL/min (ref >60)
Glucose, Bld: 273 mg/dL — ABNORMAL HIGH (ref 70–99)
Potassium: 3.4 mmol/L — ABNORMAL LOW (ref 3.5–5.1)
Sodium: 138 mmol/L (ref 135–145)
Total Bilirubin: 0.3 mg/dL (ref 0.3–1.2)
Total Protein: 4.3 g/dL — ABNORMAL LOW (ref 6.5–8.1)

## 2022-04-08 LAB — C-REACTIVE PROTEIN: CRP: 12.4 mg/dL — ABNORMAL HIGH (ref <1.0)

## 2022-04-08 LAB — CBC WITH DIFFERENTIAL/PLATELET
Abs Immature Granulocytes: 0.02 10*3/uL (ref 0.00–0.07)
Basophils Absolute: 0 10*3/uL (ref 0.0–0.1)
Basophils Relative: 1 %
Eosinophils Absolute: 0.1 10*3/uL (ref 0.0–0.5)
Eosinophils Relative: 2 %
HCT: 37.9 % — ABNORMAL LOW (ref 39.0–52.0)
Hemoglobin: 12.6 g/dL — ABNORMAL LOW (ref 13.0–17.0)
Immature Granulocytes: 1 %
Lymphocytes Relative: 30 %
Lymphs Abs: 1.2 10*3/uL (ref 0.7–4.0)
MCH: 27.7 pg (ref 26.0–34.0)
MCHC: 33.2 g/dL (ref 30.0–36.0)
MCV: 83.3 fL (ref 80.0–100.0)
Monocytes Absolute: 0.5 10*3/uL (ref 0.1–1.0)
Monocytes Relative: 13 %
Neutro Abs: 2.1 10*3/uL (ref 1.7–7.7)
Neutrophils Relative %: 53 %
Platelets: 158 10*3/uL (ref 150–400)
RBC: 4.55 MIL/uL (ref 4.22–5.81)
RDW: 12.6 % (ref 11.5–15.5)
WBC: 3.9 10*3/uL — ABNORMAL LOW (ref 4.0–10.5)
nRBC: 0 % (ref 0.0–0.2)

## 2022-04-08 LAB — PROCALCITONIN: Procalcitonin: <0.1 ng/mL

## 2022-04-08 LAB — LACTATE DEHYDROGENASE: LDH: 123 U/L (ref 98–192)

## 2022-04-08 LAB — GLUCOSE, CAPILLARY
Glucose-Capillary: 240 mg/dL — ABNORMAL HIGH (ref 70–99)
Glucose-Capillary: 155 mg/dL — ABNORMAL HIGH (ref 70–99)
Glucose-Capillary: 238 mg/dL — ABNORMAL HIGH (ref 70–99)

## 2022-04-08 LAB — D-DIMER, QUANTITATIVE: D-Dimer, Quant: 0.29 ug/mL-FEU (ref 0.00–0.50)

## 2022-04-08 LAB — PHOSPHORUS: Phosphorus: 2.5 mg/dL (ref 2.5–4.6)

## 2022-04-08 LAB — MAGNESIUM: Magnesium: 1.9 mg/dL (ref 1.7–2.4)

## 2022-04-08 LAB — FERRITIN: Ferritin: 290 ng/mL (ref 24–336)

## 2022-04-08 MED ORDER — LISINOPRIL 40 MG PO TABS: 40.0000 mg | ORAL_TABLET | Freq: Every day | ORAL | 3 refills | Status: AC

## 2022-04-08 MED ORDER — POTASSIUM CHLORIDE CRYS ER 20 MEQ PO TBCR
40.0000 meq | EXTENDED_RELEASE_TABLET | Freq: Once | ORAL | Status: AC
Start: 2022-04-08 — End: 2022-04-08
  Administered 2022-04-08: 40 meq via ORAL
  Filled 2022-04-08: qty 2

## 2022-04-08 MED ORDER — ATORVASTATIN CALCIUM 40 MG PO TABS: 40.0000 mg | ORAL_TABLET | Freq: Every day | ORAL | 3 refills | Status: AC

## 2022-04-08 MED ORDER — QUETIAPINE FUMARATE 50 MG PO TABS: 50.0000 mg | ORAL_TABLET | Freq: Every day | ORAL | 2 refills | Status: AC

## 2022-04-08 MED ORDER — INSULIN PEN NEEDLE 32G X 4 MM MISC: 1.0000 | Freq: Three times a day (TID) | 11 refills | Status: AC

## 2022-04-08 MED ORDER — AMLODIPINE BESYLATE 10 MG PO TABS: 10.0000 mg | ORAL_TABLET | Freq: Every day | ORAL | 3 refills | Status: AC

## 2022-04-08 MED ORDER — INSULIN LISPRO (1 UNIT DIAL) 100 UNIT/ML (KWIKPEN): 2.0000 [IU] | PEN_INJECTOR | Freq: Three times a day (TID) | SUBCUTANEOUS | 11 refills | Status: AC

## 2022-04-08 MED ORDER — BLOOD GLUCOSE METER KIT: PACK | 0 refills | Status: AC

## 2022-04-08 MED ORDER — METOPROLOL TARTRATE 75 MG PO TABS: 75.0000 mg | ORAL_TABLET | Freq: Two times a day (BID) | ORAL | 3 refills | Status: AC

## 2022-04-08 MED ORDER — CLOPIDOGREL BISULFATE 75 MG PO TABS: 75.0000 mg | ORAL_TABLET | Freq: Every day | ORAL | 3 refills | Status: AC

## 2022-04-08 MED ORDER — AMLODIPINE BESYLATE 10 MG PO TABS
10.0000 mg | ORAL_TABLET | Freq: Every day | ORAL | Status: DC
  Administered 2022-04-08: 10 mg via ORAL
  Filled 2022-04-08: qty 1

## 2022-04-08 MED ORDER — INSULIN GLARGINE 100 UNIT/ML SOLOSTAR PEN: 18.0000 [IU] | PEN_INJECTOR | Freq: Every day | SUBCUTANEOUS | 11 refills | Status: AC

## 2022-04-08 MED ORDER — MOLNUPIRAVIR EUA 200MG CAPSULE: 4.0000 | ORAL_CAPSULE | Freq: Two times a day (BID) | ORAL | 0 refills | Status: AC

## 2022-04-08 NOTE — Progress Notes (Signed)
Inpatient Diabetes Program Recommendations ? ?AACE/ADA: New Consensus Statement on Inpatient Glycemic Control  ? ?Target Ranges:  Prepandial:   less than 140 mg/dL ?     Peak postprandial:   less than 180 mg/dL (1-2 hours) ?     Critically ill patients:  140 - 180 mg/dL  ? ? Latest Reference Range & Units 04/08/22 03:32 04/08/22 08:36  ?Glucose-Capillary 70 - 99 mg/dL 785 (H) 885 (H)  ? ? Latest Reference Range & Units 04/07/22 09:10 04/07/22 12:28 04/07/22 16:35 04/07/22 20:02 04/07/22 23:53  ?Glucose-Capillary 70 - 99 mg/dL 027 (H) 741 (H) 287 (H) 273 (H) 223 (H)  ? ?Review of Glycemic Control ? ?Diabetes history: DM2 ?Outpatient Diabetes medications: Prescribed Amaryl and Metformin in the past  ?Current orders for Inpatient glycemic control: Semglee 18 units daily and Novolog 0-15 units Q4H ?  ?Inpatient Diabetes Program Recommendations:   ?  ?Insulin: Please consider increasing Semglee to 20 units daily and Novolog 3 units TID with meals for meal coverage if patient eats at least 50% of meals. ?  ?Diet: If appropriate, please discontinue Regular diet and order Carb Modified diet. ? ?NOTE: Noted consult for Diabetes Coordinator. Diabetes Coordinator is not on campus over the weekend but available by pager from 8am to 5pm for questions or concerns. Diabetes coordinator spoke with patient on 04/06/22 regarding A1C of 13%. Patient reported he stopped taking DM medication about 1 year ago. Patient reported that he was prescribed insulin 2 years ago but he never started taking insulin outpatient. Patient stated he was unsure of taking insulin outpatient and stated he would like to use oral DM medication. Patient was encouraged to talk with attending provider about outpatient plan for DM control. Placed order on 04/07/22 for bedside nursing to work with patient on insulin administration in case he is discharged on insulin. ? ?Thanks, ?Orlando Penner, RN, MSN, CDE ?Diabetes Coordinator ?Inpatient Diabetes  Program ?[phone] (Team Pager from 8am to 5pm) ? ?

## 2022-04-08 NOTE — Discharge Summary (Signed)
?Physician Discharge Summary ?  ?Patient: Benjamin Cohen MRN: [id number] DOB: [birth year]  ?Admit date:     04/05/2022  ?Discharge date: 04/08/22  ?Discharge Physician: Dwyane Dee  ? ?PCP: Scifres, Dorothy, PA-C  ? ?Recommendations at discharge:  ? ?Follow-up with cardiology ?Follow-up with primary care and adjust insulin regimen and blood pressure regimen as needed ? ?Discharge Diagnoses: ?Active Problems: ?  COVID-19 virus infection ?  Elevated troponin ?  Uncontrolled type 2 diabetes mellitus with hyperglycemia, without long-term current use of insulin ([place]) ?  HLD (hyperlipidemia) ?  History of stroke ?  Essential hypertension ?  Dysphagia ?  Hypokalemia ?  Hypophosphatemia ? ?Principal Problem (Resolved): ?  Syncope ?Resolved Problems: ?  Encephalopathy due to COVID-19 virus ? ?Hospital Course: ?[name] is a 66 y.o. male with PMH CVA, DMII, HTN, HLD, medication noncompliance who presented after being found down in a parking lot outside of a gas station.  He reports feeling dizzy and lightheaded just prior to falling but was unable to give further details beyond this.  He is also a poor historian at baseline.  He states he does live in [place].  He has been fasting the past several days for Ramadan but also states he has not been eating much after sundown either.  He could not really provide elaboration as to why either. ? ?On admission he underwent further work-up and was positive for COVID-19.  He did not have any hypoxia and only endorsed a mild cough for a handful of days.  He had no obvious reported sick contacts. ?See below for further workup and plan.  ? ?Assessment and Plan: ?* Syncope-resolved as of 04/07/2022 ?- likely multifactorial in setting of decreased PO intake, covid related ?- s/p IVF  ?- echo shows EF 55-60%, no RWMA, moderate LVH, small circumferential pericardial effusion ?- remains stable  ? ?Elevated troponin ?- denies CP but trops elevated on admission; no new EKG changes (TWI  noted on lateral leads present on prior as well) ?- given uncontrolled HTN and covid will ask for cardiology input if further workup needed ?-Appreciate cardiology involvement, recommendation is for outpatient follow-up for further ischemic testing/stress testing after resolution of COVID ?- trops have peaked at 97 and downtrended  ? ?COVID-19 virus infection ?- CT value 27 on admission ?- continue molnupiravir to complete course  ?- CXR relatively clear; no GGO or infiltrates; remains on RA as well ?- trend inflammatory markers ?- hold off on CTA for now unless dimer >3 (downtrending and remains on RA, very low suspicion for PE). Well's score 1.5 pts and low risk as well ?-Lovenox for DVT prophylaxis while in hospital  ? ?Uncontrolled type 2 diabetes mellitus with hyperglycemia, without long-term current use of insulin ([facility]) ?- A1c 13% ?- continue SSI and CBG ?- start semglee; adjusting as needed ?-Discharged with Lantus FlexPen and Humalog pen ? ?HLD (hyperlipidemia) ?- LDL 119 ?- start back on statin ? ?History of stroke ?- continue plavix  ? ?Essential hypertension ?- Uncontrolled due to noncompliance most likely ?- Start lisinopril and will modify regimen as needed ?- increase lopressor ?- start amlodipine  ?-Prescriptions for lisinopril, Lopressor, amlodipine provided at discharge ? ?Dysphagia ?- swallowing adequately per bedside eval ?- continue diet ? ?Encephalopathy due to COVID-19 virus-resolved as of 04/08/2022 ?- possibly from covid but may also be from prior CVA as does not fully appear acute on assessment  ?- monitor mentation on treatment  ? ?Hypophosphatemia ?- Repleted ? ?Hypokalemia ?-  Repleted ? ? ? ? ?  ? ?Consultants: Cardiology ?Procedures performed:   ?Disposition: Home ?Diet recommendation:  ?Discharge Diet Orders (From admission, onward)  ? ?  Start     Ordered  ? 04/08/22 0000  Diet Carb Modified       ? 04/08/22 1204  ? ?  ?  ? ?  ? ?Carb modified diet ?DISCHARGE MEDICATION: ?Allergies as  of 04/08/2022   ?No Known Allergies ?  ? ?  ?Medication List  ?  ? ?STOP taking these medications   ? ?acetaminophen 325 MG tablet ?Commonly known as: TYLENOL ?  ?diclofenac sodium 1 % Gel ?Commonly known as: VOLTAREN ?  ?glimepiride 2 MG tablet ?Commonly known as: AMARYL ?  ? ?  ? ?TAKE these medications   ? ?amLODipine 10 MG tablet ?Commonly known as: NORVASC ?Take 1 tablet (10 mg total) by mouth daily. ?Start taking on: April 09, 2022 ?What changed:  ?medication strength ?how much to take ?  ?atorvastatin 40 MG tablet ?Commonly known as: LIPITOR ?Take 1 tablet (40 mg total) by mouth daily. ?Start taking on: April 09, 2022 ?What changed: when to take this ?  ?blood glucose meter kit and supplies ?Dispense based on patient and insurance preference. Use up to four times daily as directed. (FOR ICD-10 E10.9, E11.9). ?  ?clopidogrel 75 MG tablet ?Commonly known as: PLAVIX ?Take 1 tablet (75 mg total) by mouth daily. ?Start taking on: April 09, 2022 ?  ?insulin glargine 100 UNIT/ML Solostar Pen ?Commonly known as: LANTUS ?Inject 18 Units into the skin daily. ?  ?insulin lispro 100 UNIT/ML KwikPen ?Commonly known as: HUMALOG ?Inject 2-15 Units into the skin 4 (four) times daily - after meals and at bedtime. Glucose 121 - 150: 2 units, Glucose 151 - 200: 3 units, Glucose 201 - 250: 5 units, Glucose 251 - 300: 8 units, Glucose 301 - 350: 11 units, Glucose 351 - 400: 15 units, Glucose > 400 call provider ?  ?Insulin Pen Needle 32G X 4 MM Misc ?1 each by Does not apply route 4 (four) times daily - after meals and at bedtime. ?  ?lisinopril 40 MG tablet ?Commonly known as: ZESTRIL ?Take 1 tablet (40 mg total) by mouth daily. ?Start taking on: April 09, 2022 ?  ?Metoprolol Tartrate 75 MG Tabs ?Take 75 mg by mouth 2 (two) times daily. ?What changed:  ?medication strength ?how much to take ?  ?molnupiravir EUA 200 mg Caps capsule ?Commonly known as: LAGEVRIO ?Take 4 capsules (800 mg total) by mouth 2 (two) times daily for 5  days. ?  ?QUEtiapine 50 MG tablet ?Commonly known as: SEROQUEL ?Take 1 tablet (50 mg total) by mouth at bedtime. ?  ? ?  ? ?  ?  ? ? ?  ?Durable Medical Equipment  ?(From admission, onward)  ?  ? ? ?  ? ?  Start     Ordered  ? 04/08/22 1209  For home use only DME Walker rolling  Once       ?Question Answer Comment  ?Walker: With 5 Inch Wheels   ?Patient needs a walker to treat with the following condition Generalized muscle weakness   ?  ? 04/08/22 1208  ? ?  ?  ? ?  ? ? Follow-up Information   ? ? Cantwell, Celeste C, PA-C Follow up on 04/21/2022.   ?Specialty: Cardiology ?Why: Appointment at [facility] cardiovascular at 9:00 a.m.  Please bring all medications to follow-up visit ?Contact information: ?[address] ?[address] ?[place] [address] ?[phone] ? ? ?  ?  ? ? [place], [place] Follow up.   ?Specialty: Home Health Services ?Why: also known as [facility]. They will call you in 1-2 days to set up your first home visit. ?Contact information: ?[address] ?[address] ?[place] [address] ?[phone] ? ? ?  ?  ? ?  ?  ? ?  ? ?Discharge Exam: ?Danley Danker Weights  ? 04/05/22 1835  ?Weight: 104.3 kg  ? ?Physical Exam ?Constitutional:   ?   General: He is not in acute distress. ?   Comments: Disheveled adult man resting in bed in no distress with obvious confusion  ?HENT:  ?   Head: Normocephalic and atraumatic.  ?   Mouth/Throat:  ?   Mouth: Mucous membranes are moist.  ?Eyes:  ?   Extraocular Movements: Extraocular movements intact.  ?Cardiovascular:  ?   Rate and Rhythm: Normal rate and regular rhythm.  ?   Heart sounds: Normal heart sounds.  ?Pulmonary:  ?   Effort: Pulmonary effort is normal. No respiratory distress.  ?   Breath sounds: Normal breath sounds. No wheezing.  ?Abdominal:  ?   General: Bowel sounds are normal. There is no distension.  ?   Palpations: Abdomen is soft.  ?   Tenderness: There is no abdominal tenderness.  ?Musculoskeletal:     ?   General: Normal range of  motion.  ?   Cervical back: Normal range of motion and neck supple.  ?Skin: ?   General: Skin is warm and dry.  ?Neurological:  ?   Comments: Slowed mentation.  Follows commands.  Moves all 4 extremities.  ?P

## 2022-04-08 NOTE — Progress Notes (Signed)
Nsg Discharge Note ? ?Admit Date:  04/05/2022 ?Discharge date: 04/08/2022 ?  ?[name] to be D/C'd home with [facility] per MD order.  AVS completed. Patient/caregiver able to verbalize understanding. ? ?Discharge Medication: ?Allergies as of 04/08/2022   ?No Known Allergies ?  ? ?  ?Medication List  ?  ? ?STOP taking these medications   ? ?acetaminophen 325 MG tablet ?Commonly known as: TYLENOL ?  ?diclofenac sodium 1 % Gel ?Commonly known as: VOLTAREN ?  ?glimepiride 2 MG tablet ?Commonly known as: AMARYL ?  ? ?  ? ?TAKE these medications   ? ?amLODipine 10 MG tablet ?Commonly known as: NORVASC ?Take 1 tablet (10 mg total) by mouth daily. ?Start taking on: April 09, 2022 ?What changed:  ?medication strength ?how much to take ?  ?atorvastatin 40 MG tablet ?Commonly known as: LIPITOR ?Take 1 tablet (40 mg total) by mouth daily. ?Start taking on: April 09, 2022 ?What changed: when to take this ?  ?blood glucose meter kit and supplies ?Dispense based on patient and insurance preference. Use up to four times daily as directed. (FOR ICD-10 E10.9, E11.9). ?  ?clopidogrel 75 MG tablet ?Commonly known as: PLAVIX ?Take 1 tablet (75 mg total) by mouth daily. ?Start taking on: April 09, 2022 ?  ?insulin glargine 100 UNIT/ML Solostar Pen ?Commonly known as: LANTUS ?Inject 18 Units into the skin daily. ?  ?insulin lispro 100 UNIT/ML KwikPen ?Commonly known as: HUMALOG ?Inject 2-15 Units into the skin 4 (four) times daily - after meals and at bedtime. Glucose 121 - 150: 2 units, Glucose 151 - 200: 3 units, Glucose 201 - 250: 5 units, Glucose 251 - 300: 8 units, Glucose 301 - 350: 11 units, Glucose 351 - 400: 15 units, Glucose > 400 call provider ?  ?Insulin Pen Needle 32G X 4 MM Misc ?1 each by Does not apply route 4 (four) times daily - after meals and at bedtime. ?  ?lisinopril 40 MG tablet ?Commonly known as: ZESTRIL ?Take 1 tablet (40 mg total) by mouth daily. ?Start taking on: April 09, 2022 ?  ?Metoprolol Tartrate 75 MG  Tabs ?Take 75 mg by mouth 2 (two) times daily. ?What changed:  ?medication strength ?how much to take ?  ?molnupiravir EUA 200 mg Caps capsule ?Commonly known as: LAGEVRIO ?Take 4 capsules (800 mg total) by mouth 2 (two) times daily for 5 days. ?  ?QUEtiapine 50 MG tablet ?Commonly known as: SEROQUEL ?Take 1 tablet (50 mg total) by mouth at bedtime. ?  ? ?  ? ?  ?  ? ? ?  ?Durable Medical Equipment  ?(From admission, onward)  ?  ? ? ?  ? ?  Start     Ordered  ? 04/08/22 1209  For home use only DME Walker rolling  Once       ?Question Answer Comment  ?Walker: With 5 Inch Wheels   ?Patient needs a walker to treat with the following condition Generalized muscle weakness   ?  ? 04/08/22 1208  ? ?  ?  ? ?  ? ? ?Discharge Assessment: ?Vitals:  ? 04/08/22 0836 04/08/22 1237  ?BP: (!) 162/91 122/82  ?Pulse: 81 75  ?Resp:  18  ?Temp: 98.4 ?F (36.9 ?C) 98 ?F (36.7 ?C)  ?SpO2: 96% 96%  ? Skin clean, dry and intact without evidence of skin break down, no evidence of skin tears noted. ?IV catheter discontinued intact. Site without signs and symptoms of complications - no redness or  edema noted at insertion site, patient denies c/o pain - only slight tenderness at site.  Dressing with slight pressure applied. ? ?D/c Instructions-Education: ?Discharge instructions given to patient/family with verbalized understanding. ?D/c education completed with patient/family including follow up instructions, medication list, d/c activities limitations if indicated, with other d/c instructions as indicated by MD - patient able to verbalize understanding, all questions fully answered. ?Patient instructed to return to ED, call 911, or call MD for any changes in condition.  ?Patient escorted via [place], and D/C home via private auto. ? ?Atilano Ina, RN ?04/08/2022 4:21 PM  ?

## 2022-04-08 NOTE — TOC Transition Note (Signed)
Transition of Care (TOC) - CM/SW Discharge Note ? ? ?Patient Details  ?Name: Benjamin Cohen ?MRN: [id number] ?Date of Birth: 02-07-1956 ? ?Transition of Care (TOC) CM/SW Contact:  ?Lawerance Sabal, RN ?Phone Number: ?04/08/2022, 12:16 PM ? ? ?Clinical Narrative:   Home health services set up with [facility]) notified liaison that patient will DC today. ?Ordered RW to room for patient to take home.  ?No other TOC needs identified.  ? ? ? ?Final next level of care: Home w Home Health Services ?  ? ? ?Patient Goals and CMS Choice ?Patient states their goals for this hospitalization and ongoing recovery are:: to go home ?CMS Medicare.gov Compare Post Acute Care list provided to:: Patient ?Choice offered to / list presented to : Patient ? ?Discharge Placement ?  ?           ?  ?  ?  ?  ? ?Discharge Plan and Services ?  ?Discharge Planning Services: CM Consult ?           ?DME Arranged: Walker rolling ?DME Agency: [employer] ?Date DME Agency Contacted: 04/08/22 ?Time DME Agency Contacted: 1214 ?Representative spoke with at DME Agency: Leavy Cella ?HH Arranged: RN, PT, OT, Nurse's Aide ?HH Agency: [facility] ?Date HH Agency Contacted: 04/08/22 ?Time HH Agency Contacted: 1215 ?Representative spoke with at [facility] Agency: Angie ? ?Social Determinants of Health (SDOH) Interventions ?  ? ? ?Readmission Risk Interventions ?   ? View : No data to display.  ?  ?  ?  ? ? ? ? ? ?

## 2022-04-10 LAB — CULTURE, BLOOD (ROUTINE X 2)
Culture: NO GROWTH
Culture: NO GROWTH
Special Requests: ADEQUATE

## 2022-04-10 NOTE — Telephone Encounter (Signed)
Called pt no answer left a vm for TOC

## 2022-04-20 NOTE — Progress Notes (Deleted)
Primary Physician/Referring:  Scifres, Earlie Server, PA-C (Inactive)  Patient ID: Benjamin Cohen, male    DOB: [birth year], 66 y.o.   MRN: [id number]  No chief complaint on file.  HPI:    Benjamin Cohen  is a 66 y.o. ***  Past Medical History:  Diagnosis Date   Diabetes mellitus without complication ([place])    Hypertension    Stroke [facility])    Past Surgical History:  Procedure Laterality Date   NO PAST SURGERIES     Family History  Problem Relation Age of Onset   Cancer Mother    Healthy Father    Hypertension Brother     Social History   Tobacco Use   Smoking status: Never   Smokeless tobacco: Never  Substance Use Topics   Alcohol use: No    Alcohol/week: 0.0 standard drinks   Marital Status: Single   ROS  ***ROS  Objective  There were no vitals taken for this visit.     04/08/2022   12:37 PM 04/08/2022    8:36 AM 04/08/2022    3:05 AM  Vitals with BMI  Systolic 048 889 169  Diastolic 82 91 87  Pulse 75 81 81      ***Physical Exam  Laboratory examination:   Recent Labs    04/06/22 0821 04/07/22 0832 04/08/22 0130  NA 139 136 138  K 3.0* 3.0* 3.4*  CL 108 106 110  CO2 22 23 22   GLUCOSE 157* 194* 273*  BUN 6* 10 8  CREATININE 0.86 1.06 0.94  CALCIUM 7.8* 7.8* 7.9*  GFRNONAA >60 >60 >60   CrCl cannot be calculated (Unknown ideal weight.).     Latest Ref Rng & Units 04/08/2022    1:30 AM 04/07/2022    8:32 AM 04/06/2022    8:21 AM  CMP  Glucose 70 - 99 mg/dL 273   194   157    BUN 8 - 23 mg/dL 8   10   6     Creatinine 0.61 - 1.24 mg/dL 0.94   1.06   0.86    Sodium 135 - 145 mmol/L 138   136   139    Potassium 3.5 - 5.1 mmol/L 3.4   3.0   3.0    Chloride 98 - 111 mmol/L 110   106   108    CO2 22 - 32 mmol/L 22   23   22     Calcium 8.9 - 10.3 mg/dL 7.9   7.8   7.8    Total Protein 6.5 - 8.1 g/dL 4.3   5.8   6.1     6.2    Total Bilirubin 0.3 - 1.2 mg/dL 0.3   0.8   0.7     0.8    Alkaline Phos 38 - 126 U/L 86   86   82     82    AST 15 - 41  U/L 25   25   21     21     ALT 0 - 44 U/L 20   22   15     16         Latest Ref Rng & Units 04/08/2022    1:30 AM 04/07/2022    8:32 AM 04/06/2022    3:33 PM  CBC  WBC 4.0 - 10.5 K/uL 3.9   4.4   6.7    Hemoglobin 13.0 - 17.0 g/dL 12.6   13.1   13.3    Hematocrit 39.0 -  52.0 % 37.9   40.1   39.9    Platelets 150 - 400 K/uL 158   152   155      Lipid Panel Recent Labs    04/06/22 0250  CHOL 194  TRIG 123  LDLCALC 119*  VLDL 25  HDL 50  CHOLHDL 3.9    HEMOGLOBIN A1C Lab Results  Component Value Date   HGBA1C 13.0 (H) 04/05/2022   MPG 326.4 04/05/2022   TSH Recent Labs    04/06/22 0821  TSH 1.660    External labs:   ***  Allergies  No Known Allergies    Medications Prior to Visit:   Outpatient Medications Prior to Visit  Medication Sig Dispense Refill   amLODipine (NORVASC) 10 MG tablet Take 1 tablet (10 mg total) by mouth daily. 30 tablet 3   atorvastatin (LIPITOR) 40 MG tablet Take 1 tablet (40 mg total) by mouth daily. 30 tablet 3   blood glucose meter kit and supplies Dispense based on patient and insurance preference. Use up to four times daily as directed. (FOR ICD-10 E10.9, E11.9). 1 each 0   clopidogrel (PLAVIX) 75 MG tablet Take 1 tablet (75 mg total) by mouth daily. 30 tablet 3   insulin glargine (LANTUS) 100 UNIT/ML Solostar Pen Inject 18 Units into the skin daily. 15 mL 11   insulin lispro (HUMALOG) 100 UNIT/ML KwikPen Inject 2-15 Units into the skin 4 (four) times daily - after meals and at bedtime. Glucose 121 - 150: 2 units, Glucose 151 - 200: 3 units, Glucose 201 - 250: 5 units, Glucose 251 - 300: 8 units, Glucose 301 - 350: 11 units, Glucose 351 - 400: 15 units, Glucose > 400 call provider 15 mL 11   Insulin Pen Needle 32G X 4 MM MISC 1 each by Does not apply route 4 (four) times daily - after meals and at bedtime. 200 each 11   lisinopril (ZESTRIL) 40 MG tablet Take 1 tablet (40 mg total) by mouth daily. 30 tablet 3   metoprolol tartrate 75 MG  TABS Take 75 mg by mouth 2 (two) times daily. 60 tablet 3   QUEtiapine (SEROQUEL) 50 MG tablet Take 1 tablet (50 mg total) by mouth at bedtime. 30 tablet 2   No facility-administered medications prior to visit.     Final Medications at End of Visit    No outpatient medications have been marked as taking for the 04/21/22 encounter (Appointment) with Rayetta Pigg, Juliene Kirsh C, PA-C.     Radiology:   No results found.  Cardiac Studies:   ***  EKG:   ***  ***  Assessment  No diagnosis found.   There are no discontinued medications.  No orders of the defined types were placed in this encounter.   Recommendations:   Benjamin Cohen is a 66 y.o. ***  ***  Patient was seen in collaboration with Dr. Marland Kitchen He also reviewed patient's chart and examined the patient. Dr. Marland Kitchen is in agreement of the plan.   During this visit I reviewed and updated: Tobacco history  allergies medication reconciliation  medical history  surgical history  family history  social history.  This note was created using a voice recognition software as a result there may be grammatical errors inadvertently enclosed that do not reflect the nature of this encounter. Every attempt is made to correct such errors.   Alethia Berthold, PA-C 04/20/2022, 12:10 PM Office: [phone]

## 2022-04-21 ENCOUNTER — Ambulatory Visit: Payer: Medicare Other | Admitting: Student

## 2022-05-18 ENCOUNTER — Ambulatory Visit: Payer: Self-pay

## 2022-05-18 NOTE — Telephone Encounter (Addendum)
  Chief Complaint: CBG 445 and 30 minutes later 449 Symptoms: denies sx Frequency: has not taken insulin since discharge for ED Pertinent Negatives: Patient denies sx Disposition: [] ED /[] Urgent Care (no appt availability in office) / [x] Appointment(In office/virtual)/ []  [place] [facility]/ [] Home Care/ [] Refused Recommended Disposition /[]  Mobile Bus/ []  Follow-up with PCP Additional Notes: a new pt appt made for pt with Dr. . Pt placed on wait list. Pt does not have PCP to ask how much insulin to take if over 400. Advised to take 15 units. Advised pt to call back for any further issues.   New pt that needs hospital f/u appt    Reason for Disposition  Blood glucose > 400 mg/dL ( mmol/L)    Pt does not have a PCP.  Answer Assessment - Initial Assessment Questions 1. BLOOD GLUCOSE: "What is your blood glucose level?"      445 449 2. ONSET: "When did you check the blood glucose?"     20 minutes 3. USUAL RANGE: "What is your glucose level usually?" (e.g., usual fasting morning value, usual evening value)     New at checking BS 4. KETONES: "Do you check for ketones (urine or blood test strips)?" If yes, ask: "What does the test show now?"      N/a 5. TYPE 1 or 2:  "Do you know what type of diabetes you have?"  (e.g., Type 1, Type 2, Gestational; doesn't know)      Type 2 diabetes 6. INSULIN: "Do you take insulin?" "What type of insulin(s) do you use? What is the mode of delivery? (syringe, pen; injection or pump)?"      Humalog and Lantus pens 7. DIABETES PILLS: "Do you take any pills for your diabetes?" If yes, ask: "Have you missed taking any pills recently?"     no 8. OTHER SYMPTOMS: "Do you have any symptoms?" (e.g., fever, frequent urination, difficulty breathing, dizziness, weakness, vomiting)     no  Protocols used: Diabetes - High Blood Sugar-A-AH

## 2022-05-18 NOTE — Telephone Encounter (Signed)
Noted  

## 2022-09-12 ENCOUNTER — Ambulatory Visit: Payer: Medicare Other | Admitting: Critical Care Medicine
